# Patient Record
Sex: Male | Born: 1935 | Race: White | Hispanic: No | Marital: Married | Smoking: Former smoker
Health system: Southern US, Community
[De-identification: ages and names within clinical notes are randomized; demographics above are authoritative.]

## PROBLEM LIST (undated history)

## (undated) DIAGNOSIS — E079 Disorder of thyroid, unspecified: Secondary | ICD-10-CM

## (undated) DIAGNOSIS — I509 Heart failure, unspecified: Secondary | ICD-10-CM

## (undated) DIAGNOSIS — D689 Coagulation defect, unspecified: Secondary | ICD-10-CM

## (undated) DIAGNOSIS — M1990 Unspecified osteoarthritis, unspecified site: Secondary | ICD-10-CM

## (undated) DIAGNOSIS — I4891 Unspecified atrial fibrillation: Secondary | ICD-10-CM

## (undated) DIAGNOSIS — J439 Emphysema, unspecified: Secondary | ICD-10-CM

## (undated) DIAGNOSIS — I10 Essential (primary) hypertension: Secondary | ICD-10-CM

## (undated) DIAGNOSIS — H269 Unspecified cataract: Secondary | ICD-10-CM

## (undated) DIAGNOSIS — IMO0002 Reserved for concepts with insufficient information to code with codable children: Secondary | ICD-10-CM

## (undated) HISTORY — DX: Heart failure, unspecified: I50.9

## (undated) HISTORY — DX: Coagulation defect, unspecified: D68.9

## (undated) HISTORY — PX: TIBIAL TUBERCLERPLASTY: SHX5953

## (undated) HISTORY — DX: Unspecified cataract: H26.9

## (undated) HISTORY — DX: Emphysema, unspecified: J43.9

## (undated) HISTORY — PX: EYE SURGERY: SHX253

## (undated) HISTORY — DX: Unspecified osteoarthritis, unspecified site: M19.90

## (undated) HISTORY — DX: Reserved for concepts with insufficient information to code with codable children: IMO0002

## (undated) HISTORY — PX: CARDIAC SURGERY: SHX584

---

## 1996-04-23 HISTORY — PX: SHOULDER ARTHROSCOPY WITH ROTATOR CUFF REPAIR: SHX5685

## 1997-03-23 HISTORY — PX: COLON SURGERY: SHX602

## 2000-07-21 HISTORY — PX: KNEE ARTHROSCOPY: SHX127

## 2009-06-13 HISTORY — PX: RADIOFREQUENCY ABLATION: SHX2290

## 2010-01-10 ENCOUNTER — Encounter: Admission: RE | Admit: 2010-01-10 | Discharge: 2010-01-10 | Payer: Self-pay | Admitting: Cardiology

## 2010-01-16 ENCOUNTER — Inpatient Hospital Stay (HOSPITAL_COMMUNITY)
Admission: RE | Admit: 2010-01-16 | Discharge: 2010-01-18 | Disposition: A | Discharge status: Home / Self Care | Payer: Self-pay | Admitting: Cardiology

## 2010-01-16 HISTORY — PX: LOOP RECORDER IMPLANT: SHX5954

## 2010-06-04 LAB — CULTURE, BLOOD (ROUTINE X 2)
Culture: NO GROWTH
Culture: NO GROWTH

## 2010-06-04 LAB — BASIC METABOLIC PANEL
CO2: 32 mEq/L (ref 19–32)
Chloride: 105 mEq/L (ref 96–112)
GFR calc Af Amer: >60 mL/min (ref >60)
GFR calc Af Amer: >60 mL/min (ref >60)
GFR calc non Af Amer: >60 mL/min (ref >60)
Glucose, Bld: 129 mg/dL — ABNORMAL HIGH (ref 70–99)
Potassium: 4.6 mEq/L (ref 3.5–5.1)
Potassium: 4.6 mEq/L (ref 3.5–5.1)
Sodium: 136 mEq/L (ref 135–145)
Sodium: 139 mEq/L (ref 135–145)

## 2010-06-04 LAB — SURGICAL PCR SCREEN: MRSA, PCR: NEGATIVE

## 2010-06-04 LAB — CBC
HCT: 41.2 % (ref 39.0–52.0)
Hemoglobin: 13.7 g/dL (ref 13.0–17.0)
Hemoglobin: 13.8 g/dL (ref 13.0–17.0)
MCH: 30.3 pg (ref 26.0–34.0)
MCHC: 33.3 g/dL (ref 30.0–36.0)
MCV: 90.4 fL (ref 78.0–100.0)
Platelets: 222 10*3/uL (ref 150–400)
RBC: 4.56 MIL/uL (ref 4.22–5.81)
RBC: 4.63 MIL/uL (ref 4.22–5.81)
WBC: 9.7 10*3/uL (ref 4.0–10.5)

## 2010-06-04 LAB — URINALYSIS, ROUTINE W REFLEX MICROSCOPIC
Glucose, UA: NEGATIVE mg/dL
Hgb urine dipstick: NEGATIVE
Specific Gravity, Urine: 1.003 — ABNORMAL LOW (ref 1.005–1.030)
pH: 6.5 (ref 5.0–8.0)

## 2010-06-04 LAB — PROTIME-INR: INR: 1.31 (ref 0.00–1.49)

## 2010-11-19 ENCOUNTER — Ambulatory Visit (HOSPITAL_COMMUNITY)
Admission: RE | Admit: 2010-11-19 | Discharge: 2010-11-19 | Disposition: A | Discharge status: Home / Self Care | Payer: Medicare Other | Source: Ambulatory Visit | Attending: Cardiovascular Disease | Admitting: Cardiovascular Disease

## 2010-11-19 ENCOUNTER — Ambulatory Visit (HOSPITAL_COMMUNITY): Payer: Medicare Other

## 2010-11-19 DIAGNOSIS — I10 Essential (primary) hypertension: Secondary | ICD-10-CM | POA: Insufficient documentation

## 2010-11-19 DIAGNOSIS — Z4509 Encounter for adjustment and management of other cardiac device: Secondary | ICD-10-CM | POA: Insufficient documentation

## 2010-11-19 DIAGNOSIS — E119 Type 2 diabetes mellitus without complications: Secondary | ICD-10-CM | POA: Insufficient documentation

## 2010-11-19 DIAGNOSIS — J449 Chronic obstructive pulmonary disease, unspecified: Secondary | ICD-10-CM | POA: Insufficient documentation

## 2010-11-19 DIAGNOSIS — J4489 Other specified chronic obstructive pulmonary disease: Secondary | ICD-10-CM | POA: Insufficient documentation

## 2010-11-19 LAB — COMPREHENSIVE METABOLIC PANEL
ALT: 22 U/L (ref 0–53)
AST: 28 U/L (ref 0–37)
Alkaline Phosphatase: 55 U/L (ref 39–117)
CO2: 30 mEq/L (ref 19–32)
Calcium: 9.2 mg/dL (ref 8.4–10.5)
Glucose, Bld: 98 mg/dL (ref 70–99)
Potassium: 4.4 mEq/L (ref 3.5–5.1)
Sodium: 141 mEq/L (ref 135–145)
Total Protein: 5.8 g/dL — ABNORMAL LOW (ref 6.0–8.3)

## 2010-11-19 LAB — CBC
Hemoglobin: 14.5 g/dL (ref 13.0–17.0)
MCH: 30.1 pg (ref 26.0–34.0)
MCV: 88 fL (ref 78.0–100.0)
Platelets: 194 10*3/uL (ref 150–400)
RBC: 4.82 MIL/uL (ref 4.22–5.81)
WBC: 6.3 10*3/uL (ref 4.0–10.5)

## 2010-11-19 LAB — PROTIME-INR: Prothrombin Time: 13.6 seconds (ref 11.6–15.2)

## 2010-11-19 LAB — APTT: aPTT: 30 seconds (ref 24–37)

## 2010-11-19 LAB — TSH: TSH: 1.821 u[IU]/mL (ref 0.350–4.500)

## 2010-11-19 NOTE — H&P (Signed)
NAMEJONUS, COBLE NO.:  [id number]  MEDICAL RECORD NO.:  [id number]  LOCATION:  [facility]                         FACILITY:  [facility]  PHYSICIAN:  Thurmon Fair, MD     DATE OF BIRTH:  May 01, 1927  DATE OF ADMISSION:  11/19/2010 DATE OF DISCHARGE:                             HISTORY & PHYSICAL   This history and physical was dictated in anticipation of implantable loop recorder removal to be done on August 29th.  HISTORY OF PRESENT ILLNESS:  Samuel Mcconnell is an 77 year old gentleman with a history of paroxysmal atrial fibrillation who underwent an atrial fibrillation ablation in March 2011 and who has not had any significant supraventricular arrhythmic events in about 12 months' time.  His loop recorder has detected a few episodes of brief atrial tachycardia, but essentially he has been arrhythmia free since January.  PAST MEDICAL HISTORY:  Significant for recent episode of pneumonia within the last 12 months, systemic hypertension, type 2 diabetes mellitus, not requiring insulin therapy and chronic obstructive pulmonary disease.  He does not have a history of known structural heart problems or coronary artery disease.  He did have a large pericardial effusion in March 2011 following his ablation and he has had multiple surgeries including partial colectomy 7- 8 years ago, left shoulder arthroscopy surgery, bilateral knee arthroscopy surgery and has reportedly had a stroke.  CURRENT MEDICATIONS: 1. Metoprolol tartrate 50 mg b.i.d. 2. Multivitamin once daily. 3. Prilosec 20 mg daily. 4. Advair p.r.n. 5. Flonase p.r.n. 6. Osteo Bi-Flex supplement. 7. Levothyroxine 88 mcg one and a half tablets alternating with 1     tablet daily. 8. Aspirin 325 mg daily.  ALLERGIES:  He is allergic to CLINDAMYCIN.  PHYSICAL EXAMINATION:  VITAL SIGNS:  He weighs approximately 200 pounds, he is 6 feet tall for BMI of roughly 27.5, blood pressure is 115/80, heart rate is 60  and regular, respiratory rate is 14.  He is afebrile. HEENT:  Head exam is unremarkable.  His pupils are equal, round and reactive to light.  Extraocular movements intact.  Mouth, ears, nose, and oropharynx within normal limits. NECK:  Supple.  Jugular venous pulsations are normal.  The carotid pulses are brisk, but delay.  There is no goiter or lymphadenopathy. LUNGS:  Clear to auscultation bilaterally. CARDIOVASCULAR:  Shows normal position, quality of apical impulse. Regular rate and rhythm.  Normal first and second heart sounds.  There are no murmurs, rubs or gallops. ABDOMEN:  Soft, nontender, nondistended without any palpable masses, abnormal pulsatility, or flank bruits.  The bowel sounds are normal. EXTREMITIES:  Do not any clubbing, cyanosis, or edema.  He has 2+ pulses in radials, dullness, branchials, femorals, popliteals, dorsalis pedis, and posterior tibials bilaterally. NEUROLOGIC:  Brief neurological exam shows no gross focal deficits.  Electrocardiogram shows a sinus rhythm, his most recent echocardiogram shows preserved left ventricle systolic function and no meaningful valvular abnormalities.  The risks and benefits of implantable loop recorder explantation were discussed.  He understands and agrees to proceed.     Thurmon Fair, MD     MC/MEDQ  D:  11/19/2010  T:  11/19/2010  Job:  [id number]  cc:   Tresa Moore  [facility] Dr. Arnetha Gula  Electronically Signed by Thurmon Fair M.D. on 11/19/2010 04:59:36 PM

## 2010-11-29 NOTE — Op Note (Signed)
  NAMERUBERT, FREDIANI               ACCOUNT NO.:  [id number]  MEDICAL RECORD NO.:  [id number]  LOCATION:  [facility]                         FACILITY:  [facility]  PHYSICIAN:  Thurmon Fair, MD     DATE OF BIRTH:  12-07-1927  DATE OF PROCEDURE:  11/19/2010 DATE OF DISCHARGE:  11/19/2010                              OPERATIVE REPORT   PROCEDURE PERFORMED:  Removal of implantable loop recorder.  REASON FOR THE PROCEDURE:  No evidence of atrial fibrillation roughly 1 year following successful atrial fibrillation ablation.  DESCRIPTION:  After risks and benefits of the procedure were described, the patient provided informed consent and was prepped and draped in usual sterile fashion in the cardiac cath lab where he had been brought in the fasting state.  Local anesthesia with 1% lidocaine was administered to the area of the left prepectoral loop recorder.  A 3-cm horizontal incision was made with a scalpel after which the pocket was opened using limited electrocautery and blunt dissection.  The loop recorder was easily explanted after its silk stitches had been resected. The pocket was examined and treated for good hemostasis.  It was flushed with copious amounts of antibiotic solution and then closed in layers using two layers of 2-0 Vicryl and a superficial layer of 4-0 Vicryl.  A sterile dressing was applied.  No immediate complications occurred.  MEDICATIONS ADMINISTERED:  Ancef 1 g intravenously, Versed and fentanyl for conscious sedation, lidocaine 1% 15 mL for local anesthesia.  COMPLICATIONS:  None.  ESTIMATED BLOOD LOSS:  Less than 10 mL .  The explanted device was a Medtronic Reveal XT implantable loop recorder.     Thurmon Fair, MD     MC/MEDQ  D:  11/19/2010  T:  11/19/2010  Job:  [id number]  cc:   __________Dr. Francina Ames  Electronically Signed by Thurmon Fair M.D. on 11/29/2010 09:50:17 AM

## 2011-11-19 HISTORY — PX: TRANSTHORACIC ECHOCARDIOGRAM: SHX275

## 2012-06-04 ENCOUNTER — Emergency Department
Admission: EM | Admit: 2012-06-04 | Discharge: 2012-06-04 | Disposition: A | Discharge status: Home / Self Care | Payer: Medicare Other | Source: Home / Self Care | Attending: Family Medicine | Admitting: Family Medicine

## 2012-06-04 ENCOUNTER — Encounter: Payer: Self-pay | Admitting: Emergency Medicine

## 2012-06-04 HISTORY — DX: Emphysema, unspecified: J43.9

## 2012-06-04 HISTORY — DX: Disorder of thyroid, unspecified: E07.9

## 2012-06-04 HISTORY — DX: Unspecified atrial fibrillation: I48.91

## 2012-06-04 MED ORDER — DOXYCYCLINE HYCLATE 100 MG PO CAPS: 100.0000 mg | ORAL_CAPSULE | Freq: Two times a day (BID) | ORAL | Status: AC

## 2012-06-04 MED ORDER — PREDNISONE 50 MG PO TABS: ORAL_TABLET | ORAL | Status: DC

## 2012-06-04 MED ORDER — METHYLPREDNISOLONE SODIUM SUCC 125 MG IJ SOLR
125.0000 mg | Freq: Once | INTRAMUSCULAR | Status: AC
  Administered 2012-06-04: 125 mg via INTRAMUSCULAR

## 2012-06-04 NOTE — ED Provider Notes (Signed)
History     CSN: [id number]  Arrival date & time 06/04/12  1109   First MD Initiated Contact with Patient 06/04/12 1114      Chief Complaint  Patient presents with  . Nasal Congestion   HPI  URI Symptoms Onset: 2-3 days Description: rhinorrhea, nasal congestion, cough  Modifying factors:  Baseline emphysema. On advair. Feels like he may develop a COPD exacerbation if he is not treated for his symptoms early.    Symptoms Nasal discharge: yes Fever: no Sore throat: no Cough: yes Wheezing: mild Ear pain: no GI symptoms: no Sick contacts: no  Red Flags  Stiff neck: no Dyspnea: no Rash: no Swallowing difficulty: no  Sinusitis Risk Factors Headache/face pain: no Double sickening: no tooth pain: no  Allergy Risk Factors Sneezing: no Itchy scratchy throat: no Seasonal symptoms: no  Flu Risk Factors Headache: no muscle aches: no severe fatigue: no   Past Medical History  Diagnosis Date  . Emphysema   . Atrial fibrillation   . Thyroid disease     Past Surgical History  Procedure Laterality Date  . Cardiac surgery    . Shoulder arthroscopy with rotator cuff repair      left  . Knee arthroscopy      left  . Colon surgery      Family History  Problem Relation Age of Onset  . Heart failure Mother   . Heart failure Father   . Heart failure Sister   . Diabetes Sister   . Heart failure Brother   . Diabetes Son     History  Substance Use Topics  . Smoking status: Former Games developer  . Smokeless tobacco: Not on file  . Alcohol Use: No      Review of Systems  All other systems reviewed and are negative.    Allergies  Clindamycin/lincomycin  Home Medications   Current Outpatient Rx  Name  Route  Sig  Dispense  Refill  . aspirin 81 MG chewable tablet   Oral   Chew 81 mg by mouth daily.         . fluticasone (VERAMYST) 27.5 MCG/SPRAY nasal spray   Nasal   Place 2 sprays into the nose daily.         . Fluticasone-Salmeterol (ADVAIR)  250-50 MCG/DOSE AEPB   Inhalation   Inhale 1 puff into the lungs every 12 (twelve) hours.         Marland Kitchen levothyroxine (SYNTHROID, LEVOTHROID) 100 MCG tablet   Oral   Take 100 mcg by mouth daily.         . metoprolol-hydrochlorothiazide (LOPRESSOR HCT) 50-25 MG per tablet   Oral   Take 1 tablet by mouth daily.         Marland Kitchen omeprazole (PRILOSEC) 10 MG capsule   Oral   Take 10 mg by mouth daily.           BP 124/67  Pulse 62  Temp(Src) 97.3 F (36.3 C) (Oral)  Ht 6' (1.829 m)  Wt 200 lb (90.719 kg)  BMI 27.12 kg/m2  SpO2 95%  Physical Exam  Constitutional: He appears well-developed and well-nourished.  HENT:  Head: Normocephalic and atraumatic.  Right Ear: External ear normal.  Left Ear: External ear normal.  +nasal erythema, rhinorrhea bilaterally, + post oropharyngeal erythema    Eyes: Conjunctivae are normal. Pupils are equal, round, and reactive to light.  Neck: Normal range of motion. Neck supple.  Cardiovascular: Normal rate, regular rhythm and normal heart sounds.  Pulmonary/Chest: Effort normal and breath sounds normal.  Abdominal: Soft.  Musculoskeletal: Normal range of motion.  Lymphadenopathy:    He has no cervical adenopathy.  Neurological: He is alert.  Skin: Skin is warm.    ED Course  Procedures (including critical care time)  Labs Reviewed - No data to display No results found.   1. URI (upper respiratory infection)   2. COPD (chronic obstructive pulmonary disease)       MDM  Likely early viral URI and prodromal stages of a COPD exacerbation. Solu-Medrol 125 mg IM x1. Will place patient on course of prednisone and doxycycline for treatment. Discussed imaging. Patient feels that it's not clinically indicated currently. Discuss infectious and respiratory red flags at length with patient which patient is aware of. Followup as needed.    The patient and/or caregiver has been counseled thoroughly with regard to treatment plan and/or  medications prescribed including dosage, schedule, interactions, rationale for use, and possible side effects and they verbalize understanding. Diagnoses and expected course of recovery discussed and will return if not improved as expected or if the condition worsens. Patient and/or caregiver verbalized understanding.             Doree Albee, MD 06/04/12 1155

## 2012-06-04 NOTE — ED Notes (Signed)
Reports nasal congestion 'dripping to chest' x 3 days; has emphysema and historically URIs turn into pneumonia rapidly. Ambulatory and smiling.

## 2012-06-24 DIAGNOSIS — J309 Allergic rhinitis, unspecified: Secondary | ICD-10-CM | POA: Insufficient documentation

## 2013-01-21 ENCOUNTER — Other Ambulatory Visit: Payer: Self-pay | Admitting: Cardiology

## 2013-01-23 NOTE — Telephone Encounter (Signed)
Rx was sent to pharmacy electronically. 

## 2013-02-21 DIAGNOSIS — N183 Chronic kidney disease, stage 3 unspecified: Secondary | ICD-10-CM | POA: Insufficient documentation

## 2013-02-21 DIAGNOSIS — N1832 Chronic kidney disease, stage 3b: Secondary | ICD-10-CM | POA: Insufficient documentation

## 2013-02-22 ENCOUNTER — Telehealth: Payer: Self-pay | Admitting: Cardiovascular Disease

## 2013-02-22 NOTE — Telephone Encounter (Signed)
Returned call and pt verified x 2 w/ pt's wife, [name].  Stated pt is in the hospital w/ Flu A.  Stated pt had EKG and pt has been going in and out of Afib.  Stated she scheduled pt to see Dr. Salena Saner on 12.24.14 and she wanted to let him know.  Denied pt is having any symptoms.  Stated he did c/o a funny feeling in his chest when he was in the ER though.  Wife stated pt is still admitted and should be going home tomorrow.  RN asked wife if they would like to be seen sooner as an appt w/ an Extender can be arranged.  Wife declined and stated she wanted to let Dr. Salena Saner know and that she has copies of the EKGs they did.  Stated she will bring them to the appt.  Wife informed Dr. Salena Saner will be notified and advised if pt becomes symptomatic, to call back for a sooner appt w/ an Extender.

## 2013-02-22 NOTE — Telephone Encounter (Signed)
Would like to tell what is going with Samuel Mcconnell .Marland Kitchen Please call at [phone] he is in the hospital in [place]   Thanks

## 2013-03-14 ENCOUNTER — Encounter: Payer: Self-pay | Admitting: *Deleted

## 2013-03-14 ENCOUNTER — Encounter: Payer: Self-pay | Admitting: Cardiovascular Disease

## 2013-03-15 ENCOUNTER — Encounter: Payer: Self-pay | Admitting: Cardiovascular Disease

## 2013-03-15 ENCOUNTER — Ambulatory Visit (INDEPENDENT_AMBULATORY_CARE_PROVIDER_SITE_OTHER): Payer: Medicare HMO | Admitting: Cardiovascular Disease

## 2013-03-15 VITALS — BP 120/66 | HR 70 | Resp 16 | Ht 72.0 in | Wt 199.8 lb

## 2013-03-15 DIAGNOSIS — I4819 Other persistent atrial fibrillation: Secondary | ICD-10-CM | POA: Insufficient documentation

## 2013-03-15 DIAGNOSIS — I480 Paroxysmal atrial fibrillation: Secondary | ICD-10-CM | POA: Insufficient documentation

## 2013-03-15 DIAGNOSIS — I4891 Unspecified atrial fibrillation: Secondary | ICD-10-CM

## 2013-03-15 MED ORDER — METOPROLOL TARTRATE 25 MG PO TABS: 12.5000 mg | ORAL_TABLET | Freq: Two times a day (BID) | ORAL | Status: DC

## 2013-03-15 MED ORDER — ASPIRIN 81 MG PO CHEW: 162.0000 mg | CHEWABLE_TABLET | Freq: Every day | ORAL | Status: DC

## 2013-03-15 NOTE — Progress Notes (Signed)
Patient ID: Samuel Mcconnell, male   DOB: [birth year], 79 y.o.   MRN: [id number]      Reason for office visit Atrial fibrillation  Mr. Montrose is an 79 year old gentleman who had successful radiofrequency ablation for atrial fibrillation several years ago. Since that time he has only had brief episodes of paroxysmal atrial tachycardia but no confirmed atrial fibrillation. He was recently hospitalized in [place] for acute influenza with respiratory difficulties. He developed atrial fibrillation during that hospitalization. As he improved, before he was discharged from the hospital, the atrial fibrillation spontaneously converted to normal rhythm.  Other than his advanced age, he really does not have other risk factors for cardioembolic events. He does not have valvular heart disease, diabetes mellitus, congestive heart failure and has never had a stroke or TIA. His chart there is a diagnosis of systemic hypertension but he is only taking a tiny amount of beta blocker for the arrhythmia and has normal blood pressure. I am not sure that he has ever truly had hypertension. He was taking higher doses of beta blocker for ventricular rate control in the past, but as he has aged and has required less beta blocker his blood pressure has remained normal.  If one discounts the diagnosis of systemic hypertension he is at low risk for stroke (CHADSVasc score 1). He has a normal size left atrium by echocardiography. He has normal left ventricular systolic function and no echo signs of diastolic dysfunction and normal pulmonary artery pressure. He does develop ankle swelling for which she takes an occasional loop diuretic.  He has mild chronic dyspnea related to COPD but prior to his flu was able to go to the gym most days of the week and exercise without a lot of problems.    Allergies  Allergen Reactions  . Clindamycin/Lincomycin   . Dabigatran Etexilate Mesylate Other (See Comments)    "felt weird"  .  Doxycycline     Dyspepsia    Current Outpatient Prescriptions  Medication Sig Dispense Refill  . aspirin 81 MG chewable tablet Chew 2 tablets (162 mg total) by mouth daily.      . fluticasone (VERAMYST) 27.5 MCG/SPRAY nasal spray Place 2 sprays into the nose daily.      . Fluticasone-Salmeterol (ADVAIR) 250-50 MCG/DOSE AEPB Inhale 1 puff into the lungs every 12 (twelve) hours.      . furosemide (LASIX) 20 MG tablet Take 20 mg by mouth daily as needed.      Marland Kitchen levothyroxine (SYNTHROID, LEVOTHROID) 88 MCG tablet Take 88 mcg by mouth daily before breakfast.      . metoprolol tartrate (LOPRESSOR) 25 MG tablet Take 0.5 tablets (12.5 mg total) by mouth 2 (two) times daily.  180 tablet  1  . Misc Natural Products (OSTEO BI-FLEX ADV DOUBLE ST PO) Take by mouth 2 (two) times daily.      . Multiple Vitamin (MULTIVITAMIN) capsule Take 1 capsule by mouth daily.      Marland Kitchen omeprazole (PRILOSEC) 10 MG capsule Take 10 mg by mouth daily.      Marland Kitchen albuterol (PROVENTIL HFA;VENTOLIN HFA) 108 (90 BASE) MCG/ACT inhaler Inhale into the lungs every 6 (six) hours as needed for wheezing or shortness of breath.       No current facility-administered medications for this visit.    Past Medical History  Diagnosis Date  . Emphysema   . Atrial fibrillation     radiofrequency ablation  . Thyroid disease     Past Surgical History  Procedure Laterality  Date  . Cardiac surgery    . Shoulder arthroscopy with rotator cuff repair Left 04/1996  . Knee arthroscopy Left 07/2000  . Colon surgery  1999  . Radiofrequency ablation  06/13/2009  . Loop recorder implant  01/16/2010    Medtronic Reveal XT (Dr. Claudia Desanctis)   . Loop recorder explant  ~ 10/2010  . Transthoracic echocardiogram  11/19/2011    EF=>55%; mild MR, mild mitral annular calcif; mild TR, normal RSVP; AV mildly sclerotic, mild AV regurg; trace pulm valve regurg     Family History  Problem Relation Age of Onset  . Heart failure Mother   . Heart failure Father       MI - died @ 18  . Diabetes Son   . Pancreatitis Son     also ETOH abuse   . Cancer Maternal Grandmother   . Heart disease Maternal Grandfather   . Atrial fibrillation Brother     dx'ed age 65  . Atrial fibrillation Brother     dx'ed age 89  . Heart failure Brother   . Atrial fibrillation Sister   . Hypertension Sister   . Heart Problems Sister   . Diabetes Sister   . Heart failure Sister     History   Social History  . Marital Status: Married    Spouse Name: N/A    Number of Children: 2  . Years of Education: N/A   Occupational History  . Not on file.   Social History Main Topics  . Smoking status: Former Smoker -- 46 years    Quit date: 03/14/2002  . Smokeless tobacco: Not on file     Comment: 40 pack year history   . Alcohol Use: No  . Drug Use: No  . Sexual Activity: Not on file   Other Topics Concern  . Not on file   Social History Narrative  . No narrative on file    Review of systems: The patient specifically denies any chest pain at rest or with exertion, dyspnea at rest or with exertion, orthopnea, paroxysmal nocturnal dyspnea, syncope, palpitations, focal neurological deficits, intermittent claudication, lower extremity edema, unexplained weight gain, cough, hemoptysis or wheezing.  The patient also denies abdominal pain, nausea, vomiting, dysphagia, diarrhea, constipation, polyuria, polydipsia, dysuria, hematuria, frequency, urgency, abnormal bleeding or bruising, fever, chills, unexpected weight changes, mood swings, change in skin or hair texture, change in voice quality, auditory or visual problems, allergic reactions or rashes, new musculoskeletal complaints other than usual "aches and pains".   PHYSICAL EXAM BP 120/66  Pulse 70  Resp 16  Ht 6' (1.829 m)  Wt 199 lb 12.8 oz (90.629 kg)  BMI 27.09 kg/m2  General: Alert, oriented x3, no distress Head: no evidence of trauma, PERRL, EOMI, no exophtalmos or lid lag, no myxedema, no xanthelasma;  normal ears, nose and oropharynx Neck: normal jugular venous pulsations and no hepatojugular reflux; brisk carotid pulses without delay and no carotid bruits Chest: clear to auscultation, no signs of consolidation by percussion or palpation, normal fremitus, symmetrical and full respiratory excursions Cardiovascular: normal position and quality of the apical impulse, regular rhythm with occasional ectopy, normal first and widely split second heart sounds, no murmurs, rubs or gallops Abdomen: no tenderness or distention, no masses by palpation, no abnormal pulsatility or arterial bruits, normal bowel sounds, no hepatosplenomegaly Extremities: no clubbing, cyanosis or edema; 2+ radial, ulnar and brachial pulses bilaterally; 2+ right femoral, posterior tibial and dorsalis pedis pulses; 2+ left femoral, posterior tibial and  dorsalis pedis pulses; no subclavian or femoral bruits Neurological: grossly nonfocal   EKG: Sinus rhythm with PACs, right bundle branch block, QTC 468 ms, no ischemic repolarization abnormalities  BMET    Component Value Date/Time   NA 141 11/19/2010 0814   K 4.4 11/19/2010 0814   CL 105 11/19/2010 0814   CO2 30 11/19/2010 0814   GLUCOSE 98 11/19/2010 0814   BUN 23 11/19/2010 0814   CREATININE 1.23 11/19/2010 0814   CALCIUM 9.2 11/19/2010 0814   GFRNONAA 56* 11/19/2010 0814   GFRAA >60 11/19/2010 0814     ASSESSMENT AND PLAN PAF (paroxysmal atrial fibrillation) This is his first documented episode of atrial fibrillation since the ablation procedure. It occurred during acute illness with respiratory failure secondary to influenza. He has never had cardioembolic events and I believe that his overall risk for stroke remains low. I decided to recommend against use of potent anticoagulant such as warfarin and prefer to keep him on aspirin for its lower side effect profile. He should take at least 162 mg of aspirin daily. I've also advised that he take metoprolol tartrate split into 2  daily doses because of its short duration of action. If he should develop any acute neurological event consistent with embolic stroke/TIA, the risk benefit analysis would drastically change and he should go on full dose anticoagulation   Orders Placed This Encounter  Procedures  . EKG 12-Lead   Meds ordered this encounter  Medications  . aspirin 81 MG chewable tablet    Sig: Chew 2 tablets (162 mg total) by mouth daily.  . metoprolol tartrate (LOPRESSOR) 25 MG tablet    Sig: Take 0.5 tablets (12.5 mg total) by mouth 2 (two) times daily.    Dispense:  180 tablet    Refill:  1    Rena Hunke  Thurmon Fair, MD, [facility] [phone] office [phone] pager

## 2013-03-15 NOTE — Patient Instructions (Signed)
INCREASE Aspirin to 81mg  2 tablets daily.  DECREASE Metoprolol to 25mg  1/2 tablet twice a day.  Your physician recommends that you schedule a follow-up appointment in: ONE YEAR.

## 2013-03-15 NOTE — Assessment & Plan Note (Addendum)
This is his first documented episode of atrial fibrillation since the ablation procedure. It occurred during acute illness with respiratory failure secondary to influenza. He has never had cardioembolic events and I believe that his overall risk for stroke remains low. I decided to recommend against use of potent anticoagulant such as warfarin and prefer to keep him on aspirin for its lower side effect profile. He should take at least 162 mg of aspirin daily. I've also advised that he take metoprolol tartrate split into 2 daily doses because of its short duration of action. If he should develop any acute neurological event consistent with embolic stroke/TIA, the risk benefit analysis would drastically change and he should go on full dose anticoagulation

## 2013-03-15 NOTE — Assessment & Plan Note (Signed)
>>  ASSESSMENT AND PLAN FOR PAF (PAROXYSMAL ATRIAL FIBRILLATION) ([facility]) WRITTEN ON 03/15/2013  2:33 PM BY CROITORU, MIHAI, MD  This is his first documented episode of atrial fibrillation since the ablation procedure. It occurred during acute illness with respiratory failure secondary to influenza. He has never had cardioembolic events and I believe that his overall risk for stroke remains low. I decided to recommend against use of potent anticoagulant such as warfarin and prefer to keep him on aspirin  for its lower side effect profile. He should take at least 162 mg of aspirin  daily. I've also advised that he take metoprolol  tartrate split into 2 daily doses because of its short duration of action. If he should develop any acute neurological event consistent with embolic stroke/TIA, the risk benefit analysis would drastically change and he should go on full dose anticoagulation

## 2013-04-21 ENCOUNTER — Other Ambulatory Visit: Payer: Self-pay | Admitting: Cardiovascular Disease

## 2013-04-24 NOTE — Telephone Encounter (Signed)
Rx was sent to pharmacy electronically. 

## 2013-06-08 ENCOUNTER — Telehealth: Payer: Self-pay | Admitting: Cardiovascular Disease

## 2013-06-08 MED ORDER — METOPROLOL TARTRATE 25 MG PO TABS: ORAL_TABLET | ORAL | Status: DC

## 2013-06-08 NOTE — Telephone Encounter (Signed)
BP  HR 148/71  79 155/78  73 147/79    After increase in meds.  135/67  67 130/61  63 (today)

## 2013-06-08 NOTE — Telephone Encounter (Signed)
Returned call and pt verified x 2.  Pt stated the last time he was seen, he was told to decrease metoprolol to half twice day and ASA to 325 mg.  Stated he has seen BP and pulse go up over the past couple of weeks, so he took a whole pill in AM and 1/2 at night.  Stated his BP and pulse came down.  Denied CP, SOB or swelling.  Pt stated he isn't sure if the doctor will agree w/ what he did or not and would like to know what he thinks.  Pt informed Dr. Loletha Grayer will be notified for further instructions.  Pt verbalized understanding and agreed w/ plan.  Message forwarded to Dr. Sallyanne Kuster.

## 2013-06-08 NOTE — Telephone Encounter (Signed)
Returned call and informed pt per instructions by MD.  Pt verbalized understanding and agreed w/ plan.  Rx sent to pharmacy to hold until refill due.

## 2013-06-08 NOTE — Telephone Encounter (Signed)
Please call,concerning his Metoprolol. It does not seen to be workin that well since he reduced it.

## 2013-06-08 NOTE — Telephone Encounter (Signed)
Returned call.  Left message to call back before 4pm.  

## 2013-06-08 NOTE — Telephone Encounter (Signed)
The BP looks great on the regimen he has changed to and I would stick with it

## 2013-06-20 ENCOUNTER — Encounter: Payer: Self-pay | Admitting: Emergency Medicine

## 2013-06-20 ENCOUNTER — Emergency Department
Admission: EM | Admit: 2013-06-20 | Discharge: 2013-06-20 | Disposition: A | Discharge status: Home / Self Care | Payer: Medicare HMO | Source: Home / Self Care | Attending: Emergency Medicine | Admitting: Emergency Medicine

## 2013-06-20 DIAGNOSIS — J441 Chronic obstructive pulmonary disease with (acute) exacerbation: Secondary | ICD-10-CM

## 2013-06-20 DIAGNOSIS — J0100 Acute maxillary sinusitis, unspecified: Secondary | ICD-10-CM

## 2013-06-20 HISTORY — DX: Essential (primary) hypertension: I10

## 2013-06-20 MED ORDER — METHYLPREDNISOLONE ACETATE 80 MG/ML IJ SUSP
80.0000 mg | Freq: Once | INTRAMUSCULAR | Status: AC
  Administered 2013-06-20: 80 mg via INTRAMUSCULAR

## 2013-06-20 MED ORDER — CEFTRIAXONE SODIUM 1 G IJ SOLR
1.0000 g | INTRAMUSCULAR | Status: AC
  Administered 2013-06-20: 1 g via INTRAMUSCULAR

## 2013-06-20 MED ORDER — PREDNISONE (PAK) 10 MG PO TABS: ORAL_TABLET | ORAL | Status: DC

## 2013-06-20 MED ORDER — IPRATROPIUM-ALBUTEROL 0.5-2.5 (3) MG/3ML IN SOLN
3.0000 mL | Freq: Once | RESPIRATORY_TRACT | Status: AC
  Administered 2013-06-20: 3 mL via RESPIRATORY_TRACT

## 2013-06-20 MED ORDER — AZITHROMYCIN 250 MG PO TABS: ORAL_TABLET | ORAL | Status: DC

## 2013-06-20 NOTE — ED Provider Notes (Addendum)
CSN: [id number]     Arrival date & time 06/20/13  7371 History   None    Chief Complaint  Patient presents with  . Cough   (Consider location/radiation/quality/duration/timing/severity/associated sxs/prior Treatment) HPI URI HISTORY  Samuel Mcconnell is a 80 y.o. male who complains of onset of cold symptoms for 7 days.  Have been using over-the-counter treatment and keratosis and prescribed by his PCP, which helps a little bit. For the past 2 days, worsening symptoms with cough productive of yellow-green sputum.  He states he does not feel severely sick, compared to when he was hospitalized for COPD exacerbation and influenza A in December.  Mild chills/sweats +  Fever  +  Nasal congestion +  Discolored Post-nasal drainage No sinus pain/pressure No sore throat  +  Cough, productive of yellow-green sputum Slight increase wheezing with exertion. No chest congestion No hemoptysis Minimal increase in his baseline dyspnea on exertion . No pleuritic pain No chest pain  No itchy/red eyes No earache  No nausea No vomiting No abdominal pain No diarrhea  No skin rashes + Mild Fatigue No myalgias No headache. No syncope. No focal neurologic symptoms   Past Medical History  Diagnosis Date  . Emphysema   . Atrial fibrillation     radiofrequency ablation  . Thyroid disease   . Hypertension    Past Surgical History  Procedure Laterality Date  . Cardiac surgery    . Shoulder arthroscopy with rotator cuff repair Left 04/1996  . Knee arthroscopy Left 07/2000  . Colon surgery  1999  . Radiofrequency ablation  06/13/2009  . Loop recorder implant  01/16/2010    Medtronic Reveal XT (Dr. Norlene Duel)   . Loop recorder explant  ~ 10/2010  . Transthoracic echocardiogram  11/19/2011    EF=>55%; mild MR, mild mitral annular calcif; mild TR, normal RSVP; AV mildly sclerotic, mild AV regurg; trace pulm valve regurg    Family History  Problem Relation Age of Onset  . Heart failure Mother   .  Heart failure Father     MI - died @ 65  . Diabetes Son   . Pancreatitis Son     also ETOH abuse   . Cancer Maternal Grandmother   . Heart disease Maternal Grandfather   . Atrial fibrillation Brother     dx'ed age 37  . Atrial fibrillation Brother     dx'ed age 108  . Heart failure Brother   . Atrial fibrillation Sister   . Hypertension Sister   . Heart Problems Sister   . Diabetes Sister   . Heart failure Sister    History  Substance Use Topics  . Smoking status: Former Smoker -- 78 years    Quit date: 03/14/2002  . Smokeless tobacco: Never Used     Comment: 40 pack year history   . Alcohol Use: No    Review of Systems  All other systems reviewed and are negative.    Allergies  Clindamycin/lincomycin; Dabigatran etexilate mesylate; and Doxycycline  Home Medications   Current Outpatient Rx  Name  Route  Sig  Dispense  Refill  . albuterol (PROVENTIL HFA;VENTOLIN HFA) 108 (90 BASE) MCG/ACT inhaler   Inhalation   Inhale into the lungs every 6 (six) hours as needed for wheezing or shortness of breath.         Marland Kitchen aspirin 81 MG chewable tablet   Oral   Chew 2 tablets (162 mg total) by mouth daily.         Marland Kitchen  azithromycin (ZITHROMAX Z-PAK) 250 MG tablet      Take 2 tablets on day one, then 1 tablet daily on days 2 through 5   1 each   0   . fluticasone (VERAMYST) 27.5 MCG/SPRAY nasal spray   Nasal   Place 2 sprays into the nose daily.         . Fluticasone-Salmeterol (ADVAIR) 250-50 MCG/DOSE AEPB   Inhalation   Inhale 1 puff into the lungs every 12 (twelve) hours.         . furosemide (LASIX) 20 MG tablet      TAKE 1 TABLET BY MOUTH EVERY DAY   30 tablet   10   . levothyroxine (SYNTHROID, LEVOTHROID) 88 MCG tablet   Oral   Take 88 mcg by mouth daily before breakfast.         . metoprolol tartrate (LOPRESSOR) 25 MG tablet      Take one tab every morning and half tab every evening.  As directed.   135 tablet   1     Please note dose increase.   Pt is aware and will c ...   . Misc Natural Products (OSTEO BI-FLEX ADV DOUBLE ST PO)   Oral   Take by mouth 2 (two) times daily.         . Multiple Vitamin (MULTIVITAMIN) capsule   Oral   Take 1 capsule by mouth daily.         Marland Kitchen omeprazole (PRILOSEC) 10 MG capsule   Oral   Take 10 mg by mouth daily.         . predniSONE (STERAPRED UNI-PAK) 10 MG tablet      Take as directed for 6 days.--Take 6 on day 1, 5 on day 2, 4 on day 3, then 3 tablets on day 4, then 2 tablets on day 5, then 1 on day 6.   21 tablet   0    BP 102/60  Pulse 71  Temp(Src) 97.8 F (36.6 C) (Oral)  Resp 16  Wt 198 lb (89.812 kg)  SpO2 98% Physical Exam  Nursing note and vitals reviewed. Constitutional: He is oriented to person, place, and time. He appears well-developed and well-nourished. No distress.  Alert, cooperative male. No diaphoresis. No acute distress. Non-toxic-appearing. He can ambulate normally. Pulse ox 93% on room air, which is his normal baseline  HENT:  Head: Normocephalic and atraumatic.  Right Ear: Tympanic membrane, external ear and ear canal normal.  Left Ear: Tympanic membrane, external ear and ear canal normal.  Nose: Mucosal edema and rhinorrhea present. Right sinus exhibits maxillary sinus tenderness. Left sinus exhibits maxillary sinus tenderness.  Mouth/Throat: Oropharynx is clear and moist. No oral lesions. No oropharyngeal exudate.  Eyes: Right eye exhibits no discharge. Left eye exhibits no discharge. No scleral icterus.  Neck: Neck supple.  Cardiovascular: Normal rate, regular rhythm and normal heart sounds.   Pulmonary/Chest: Effort normal. No accessory muscle usage. No respiratory distress. He has no decreased breath sounds. He has wheezes (minimal late expiratory). He has rhonchi. He has no rales.  Few scattered rhonchi. Breath sounds equal. No rales  Lymphadenopathy:    He has no cervical adenopathy.  Neurological: He is alert and oriented to person, place,  and time.  Skin: Skin is warm and dry.    ED Course  Procedures (including critical care time) Labs Review Labs Reviewed - No data to display Imaging Review No results found. I advised chest x-ray. Risks, benefits, alternatives discussed. He  politely refused chest x-ray.  DuoNeb nebulizer treatment given. Patient reevaluated 15 minutes later. His wheezing improved. Only had minimal rhonchi. Otherwise lungs were clear. He felt much better. Pulse ox much improved 98%    MDM   1. Acute exacerbation of chronic obstructive pulmonary disease (COPD)   2. Acute maxillary sinusitis    Treatment options discussed, as well as risks, benefits, alternatives. Patient voiced understanding and agreement with the following plans: Rocephin 1 g IM Depo-Medrol 80 mg IM stat Zithromax Z-PAK Also gave written prescription for prednisone 10 mg-6 day Dosepak. He prefers to hold onto this for 2 days, and if he's not significantly improving, to go ahead and fill this. Other symptomatic care discussed. Continue his other meds. He has albuterol HFA at home to use when necessary rescue inhaler. Follow-up with your primary care doctor in 5-7 days if not improving, or sooner if symptoms become worse. Precautions discussed. Red flags discussed. Questions invited and answered. Patient voiced understanding and agreement.     Jacqulyn Cane, MD 06/20/13 [place], MD 06/20/13 1050

## 2013-06-20 NOTE — ED Notes (Signed)
Samuel Mcconnell reports "allergies" starting about 1 week ago. Cough developed and he saw PCP 4 days ago, given Cheratussin and PNA vaccine. 2 days ago his cough became productive and colored. C/o night sweats last night. Hx COPD. SOB with exertion.

## 2013-10-04 ENCOUNTER — Other Ambulatory Visit: Payer: Self-pay | Admitting: Cardiovascular Disease

## 2013-10-04 NOTE — Telephone Encounter (Signed)
Rx was sent to pharmacy electronically. 

## 2014-03-08 ENCOUNTER — Encounter: Payer: Self-pay | Admitting: Cardiovascular Disease

## 2014-03-08 ENCOUNTER — Ambulatory Visit (INDEPENDENT_AMBULATORY_CARE_PROVIDER_SITE_OTHER): Payer: Medicare HMO | Admitting: Cardiovascular Disease

## 2014-03-08 VITALS — BP 112/60 | HR 62 | Resp 16 | Ht 72.0 in | Wt 199.5 lb

## 2014-03-08 DIAGNOSIS — I480 Paroxysmal atrial fibrillation: Secondary | ICD-10-CM

## 2014-03-08 MED ORDER — METOPROLOL TARTRATE 25 MG PO TABS: ORAL_TABLET | ORAL | Status: DC

## 2014-03-08 NOTE — Patient Instructions (Addendum)
DECREASE Metoprolol to 25mg  1/2 tablet twice a day.  Dr. Sallyanne Kuster recommends that you schedule a follow-up appointment in: One year.

## 2014-03-10 NOTE — Progress Notes (Signed)
Patient ID: Samuel Mcconnell, male   DOB: 1927-04-25, 80 y.o.   MRN: [id number]     Reason for office visit atrial fibrillation   [name] has not had clinical atrial fibrillation recurrence since the influenza-related episode one year ago (the only documented event since his remote RF ablation procedure). He is at low embolic risk (age is only risk factor) prefers to avoid anticoagulants therapy. He has not had any neurological or bleeding events. He has normal LV systolic and diastolic function and a normal size left atrium.  Occasional ankle edema (responds to mall dose of loop diuretic) and COPD related class II exertional dyspnea are his only complaints.   Allergies  Allergen Reactions  . Clindamycin/Lincomycin   . Dabigatran Etexilate Mesylate Other (See Comments)    "felt weird"  . Doxycycline     Dyspepsia    Current Outpatient Prescriptions  Medication Sig Dispense Refill  . aspirin 81 MG chewable tablet Chew 2 tablets (162 mg total) by mouth daily.    . cetirizine (ZYRTEC) 10 MG tablet Take 10 mg by mouth at bedtime.    . fluticasone (VERAMYST) 27.5 MCG/SPRAY nasal spray Place 2 sprays into the nose daily.    . Fluticasone-Salmeterol (ADVAIR) 250-50 MCG/DOSE AEPB Inhale 1 puff into the lungs every 12 (twelve) hours.    . furosemide (LASIX) 20 MG tablet TAKE 1 TABLET BY MOUTH EVERY DAY (Patient taking differently: PRN) 30 tablet 10  . levothyroxine (SYNTHROID, LEVOTHROID) 88 MCG tablet Take 88 mcg by mouth daily before breakfast.    . metoprolol tartrate (LOPRESSOR) 25 MG tablet Take 1/2 tablet twice a day. 90 tablet 2  . Misc Natural Products (OSTEO BI-FLEX ADV DOUBLE ST PO) Take by mouth 2 (two) times daily.    . Multiple Vitamin (MULTIVITAMIN) capsule Take 1 capsule by mouth daily.    Marland Kitchen omeprazole (PRILOSEC) 10 MG capsule Take 10 mg by mouth daily.    Marland Kitchen sulfamethoxazole-trimethoprim (BACTRIM DS,SEPTRA DS) 800-160 MG per tablet Take 1 tablet by mouth 2 (two) times daily.   0    No current facility-administered medications for this visit.    Past Medical History  Diagnosis Date  . Emphysema   . Atrial fibrillation     radiofrequency ablation  . Thyroid disease   . Hypertension     Past Surgical History  Procedure Laterality Date  . Cardiac surgery    . Shoulder arthroscopy with rotator cuff repair Left 04/1996  . Knee arthroscopy Left 07/2000  . Colon surgery  1999  . Radiofrequency ablation  06/13/2009  . Loop recorder implant  01/16/2010    Medtronic Reveal XT (Dr. Norlene Duel)   . Loop recorder explant  ~ 10/2010  . Transthoracic echocardiogram  11/19/2011    EF=>55%; mild MR, mild mitral annular calcif; mild TR, normal RSVP; AV mildly sclerotic, mild AV regurg; trace pulm valve regurg     Family History  Problem Relation Age of Onset  . Heart failure Mother   . Heart failure Father     MI - died @ 63  . Diabetes Son   . Pancreatitis Son     also ETOH abuse   . Cancer Maternal Grandmother   . Heart disease Maternal Grandfather   . Atrial fibrillation Brother     dx'ed age 19  . Atrial fibrillation Brother     dx'ed age 54  . Heart failure Brother   . Atrial fibrillation Sister   . Hypertension Sister   . Heart  Problems Sister   . Diabetes Sister   . Heart failure Sister     History   Social History  . Marital Status: Married    Spouse Name: N/A    Number of Children: 2  . Years of Education: N/A   Occupational History  . Not on file.   Social History Main Topics  . Smoking status: Former Smoker -- 16 years    Quit date: 03/14/2002  . Smokeless tobacco: Never Used     Comment: 40 pack year history   . Alcohol Use: No  . Drug Use: No  . Sexual Activity: Not on file   Other Topics Concern  . Not on file   Social History Narrative    Review of systems: The patient specifically denies any chest pain at rest or with exertion, dyspnea at rest or with usual exertion, orthopnea, paroxysmal nocturnal dyspnea, syncope,  palpitations, focal neurological deficits, intermittent claudication, unexplained weight gain, cough, hemoptysis or wheezing.  The patient also denies abdominal pain, nausea, vomiting, dysphagia, diarrhea, constipation, polyuria, polydipsia, dysuria, hematuria, frequency, urgency, abnormal bleeding or bruising, fever, chills, unexpected weight changes, mood swings, change in skin or hair texture, change in voice quality, auditory or visual problems, allergic reactions or rashes, new musculoskeletal complaints other than usual "aches and pains".   PHYSICAL EXAM BP 112/60 mmHg  Pulse 62  Resp 16  Ht 6' (1.829 m)  Wt 199 lb 8 oz (90.493 kg)  BMI 27.05 kg/m2 General: Alert, oriented x3, no distress Head: no evidence of trauma, PERRL, EOMI, no exophtalmos or lid lag, no myxedema, no xanthelasma; normal ears, nose and oropharynx Neck: normal jugular venous pulsations and no hepatojugular reflux; brisk carotid pulses without delay and no carotid bruits Chest: clear to auscultation, no signs of consolidation by percussion or palpation, normal fremitus, symmetrical and full respiratory excursions Cardiovascular: normal position and quality of the apical impulse, regular rhythm with occasional ectopy, normal first and widely split second heart sounds, no murmurs, rubs or gallops Abdomen: no tenderness or distention, no masses by palpation, no abnormal pulsatility or arterial bruits, normal bowel sounds, no hepatosplenomegaly Extremities: no clubbing, cyanosis or edema; 2+ radial, ulnar and brachial pulses bilaterally; 2+ right femoral, posterior tibial and dorsalis pedis pulses; 2+ left femoral, posterior tibial and dorsalis pedis pulses; no subclavian or femoral bruits Neurological: grossly nonfocal  EKG: Sinus rhythm with PACs, right bundle branch bloc  Lipid Panel  No results found for: CHOL, TRIG, HDL, CHOLHDL, VLDL, LDLCALC, LDLDIRECT  BMET    Component Value Date/Time   NA 141 11/19/2010  0814   K 4.4 11/19/2010 0814   CL 105 11/19/2010 0814   CO2 30 11/19/2010 0814   GLUCOSE 98 11/19/2010 0814   BUN 23 11/19/2010 0814   CREATININE 1.23 11/19/2010 0814   CALCIUM 9.2 11/19/2010 0814   GFRNONAA 56* 11/19/2010 0814   GFRAA >60 11/19/2010 0814     ASSESSMENT AND PLAN  No clinical recurrence of atrial fibrillation in >12 months, only one event (during respiratory acute illness) since ablation.  Continue ASA and very low dose beta blocker (may have to stop if he develops symptomatic sinus bradycardia). Yearly follow up.  Orders Placed This Encounter  Procedures  . EKG 12-Lead   Meds ordered this encounter  Medications  . sulfamethoxazole-trimethoprim (BACTRIM DS,SEPTRA DS) 800-160 MG per tablet    Sig: Take 1 tablet by mouth 2 (two) times daily.     Refill:  0  . cetirizine (ZYRTEC) 10  MG tablet    Sig: Take 10 mg by mouth at bedtime.  . metoprolol tartrate (LOPRESSOR) 25 MG tablet    Sig: Take 1/2 tablet twice a day.    Dispense:  90 tablet    Refill:  2    [place]  Sanda Klein, MD, [facility] [phone] office [phone] pager

## 2014-05-06 ENCOUNTER — Other Ambulatory Visit: Payer: Self-pay | Admitting: Cardiovascular Disease

## 2014-05-07 NOTE — Telephone Encounter (Signed)
Rx(s) sent to pharmacy electronically.  

## 2014-07-22 DIAGNOSIS — K8020 Calculus of gallbladder without cholecystitis without obstruction: Secondary | ICD-10-CM | POA: Insufficient documentation

## 2014-07-22 DIAGNOSIS — K760 Fatty (change of) liver, not elsewhere classified: Secondary | ICD-10-CM | POA: Insufficient documentation

## 2014-12-27 ENCOUNTER — Other Ambulatory Visit: Payer: Self-pay | Admitting: Cardiovascular Disease

## 2014-12-27 NOTE — Telephone Encounter (Signed)
Rx(s) sent to pharmacy electronically.  

## 2015-02-01 ENCOUNTER — Ambulatory Visit (INDEPENDENT_AMBULATORY_CARE_PROVIDER_SITE_OTHER): Payer: Medicare HMO | Admitting: Cardiovascular Disease

## 2015-02-01 ENCOUNTER — Encounter: Payer: Self-pay | Admitting: Cardiovascular Disease

## 2015-02-01 VITALS — BP 118/74 | HR 64 | Resp 16 | Ht 71.0 in | Wt 197.8 lb

## 2015-02-01 DIAGNOSIS — I480 Paroxysmal atrial fibrillation: Secondary | ICD-10-CM | POA: Diagnosis not present

## 2015-02-01 NOTE — Progress Notes (Signed)
Patient ID: Samuel Mcconnell, male   DOB: Jul 18, 1927, 81 y.o.   MRN: [id number]     Cardiology Office Note   Date:  02/02/2015   ID:  Samuel Mcconnell, DOB 1927/11/05, MRN [id number]  PCP:  Jamesetta Geralds, MD  Cardiologist:   Sanda Klein, MD   Chief Complaint  Patient presents with  . Annual Exam    No complaints      History of Present Illness: Samuel Mcconnell is a 81 y.o. male who presents for  Follow-up after an episode of paroxysmal atrial fibrillation that occurred roughly 2 years ago. This is the only documented arrhythmic event he has had since a previous remote radiofrequency ablation procedure. He is felt to be at relatively low embolic risk for stroke , despite the fact that he is 81 years old. He has never had any neurological events. He has normal left ventricular systolic and diastolic function and a normal sized left atrium. He has not had bleeding in the past, but prefers to avoid anticoagulants.  He continues to exercise at the gym an hour at a time 3 days a week on the stationary bike and weight machines. He denies exertional dyspnea( at least not more than his usual baseline). He has rare problems with ankle swelling and takes a diuretic once or twice a month as needed.    Past Medical History  Diagnosis Date  . Emphysema   . Atrial fibrillation ([place])     radiofrequency ablation  . Thyroid disease   . Hypertension     Past Surgical History  Procedure Laterality Date  . Cardiac surgery    . Shoulder arthroscopy with rotator cuff repair Left 04/1996  . Knee arthroscopy Left 07/2000  . Colon surgery  1999  . Radiofrequency ablation  06/13/2009  . Loop recorder implant  01/16/2010    Medtronic Reveal XT (Dr. Norlene Duel)   . Tibial tuberclerplasty  ~ 10/2010  . Transthoracic echocardiogram  11/19/2011    EF=>55%; mild MR, mild mitral annular calcif; mild TR, normal RSVP; AV mildly sclerotic, mild AV regurg; trace pulm valve regurg      Current Outpatient  Prescriptions  Medication Sig Dispense Refill  . aspirin 81 MG chewable tablet Chew 2 tablets (162 mg total) by mouth daily.    . cetirizine (ZYRTEC) 10 MG tablet Take 10 mg by mouth at bedtime.    . fluticasone (VERAMYST) 27.5 MCG/SPRAY nasal spray Place 2 sprays into the nose daily.    . Fluticasone-Salmeterol (ADVAIR) 250-50 MCG/DOSE AEPB Inhale 1 puff into the lungs every 12 (twelve) hours.    . furosemide (LASIX) 20 MG tablet Take 1 tablet (20 mg total) by mouth daily as needed. 30 tablet 5  . levothyroxine (SYNTHROID, LEVOTHROID) 100 MCG tablet Take 100 mcg by mouth daily.    . metoprolol tartrate (LOPRESSOR) 25 MG tablet Take 0.5 tablets (12.5 mg total) by mouth 2 (two) times daily. 90 tablet 0  . Misc Natural Products (OSTEO BI-FLEX ADV DOUBLE ST PO) Take by mouth 2 (two) times daily.    . Multiple Vitamin (MULTIVITAMIN) capsule Take 1 capsule by mouth daily.    Marland Kitchen omeprazole (PRILOSEC) 10 MG capsule Take 10 mg by mouth daily.    Marland Kitchen sulfamethoxazole-trimethoprim (BACTRIM DS,SEPTRA DS) 800-160 MG per tablet Take 1 tablet by mouth 2 (two) times daily.   0   No current facility-administered medications for this visit.    Allergies:   Clindamycin/lincomycin; Dabigatran etexilate mesylate; and Doxycycline    Social History:  The patient  reports that he quit smoking about 12 years ago. He has never used smokeless tobacco. He reports that he does not drink alcohol or use illicit drugs.   Family History:  The patient's family history includes Atrial fibrillation in his brother, brother, and sister; Cancer in his maternal grandmother; Diabetes in his sister and son; Heart Problems in his sister; Heart disease in his maternal grandfather; Heart failure in his brother, father, mother, and sister; Hypertension in his sister; Pancreatitis in his son.    ROS:  Please see the history of present illness.    Otherwise, review of systems positive for none.   All other systems are reviewed and  negative.    PHYSICAL EXAM: VS:  BP 118/74 mmHg  Pulse 64  Ht 5\' 11"  (1.803 m)  Wt 197 lb 12.8 oz (89.721 kg)  BMI 27.60 kg/m2 , BMI Body mass index is 27.6 kg/(m^2).  General: Alert, oriented x3, no distress Head: no evidence of trauma, PERRL, EOMI, no exophtalmos or lid lag, no myxedema, no xanthelasma; normal ears, nose and oropharynx Neck: normal jugular venous pulsations and no hepatojugular reflux; brisk carotid pulses without delay and no carotid bruits Chest: clear to auscultation, no signs of consolidation by percussion or palpation, normal fremitus, symmetrical and full respiratory excursions Cardiovascular: normal position and quality of the apical impulse, regular rhythm, normal first and second heart sounds, no  murmurs, rubs or gallops Abdomen: no tenderness or distention, no masses by palpation, no abnormal pulsatility or arterial bruits, normal bowel sounds, no hepatosplenomegaly Extremities: no clubbing, cyanosis or edema; 2+ radial, ulnar and brachial pulses bilaterally; 2+ right femoral, posterior tibial and dorsalis pedis pulses; 2+ left femoral, posterior tibial and dorsalis pedis pulses; no subclavian or femoral bruits Neurological: grossly nonfocal Psych: euthymic mood, full affect   EKG:  EKG is ordered today. The ekg ordered today demonstrates  Normal sinus rhythm a single PAC, right bundle branch block , QRS 148 ms, QTC 455 ms   Recent Labs: No results found for requested labs within last 365 days.    Lipid Panel No results found for: CHOL, TRIG, HDL, CHOLHDL, VLDL, LDLCALC, LDLDIRECT    Wt Readings from Last 3 Encounters:  02/01/15 197 lb 12.8 oz (89.721 kg)  03/08/14 199 lb 8 oz (90.493 kg)  06/20/13 198 lb (89.812 kg)   .   ASSESSMENT AND PLAN:  Remote history of paroxysmal atrial fibrillation status post successful radiofrequency ablation. He has had one recurrent event that occurred during an acute respiratory illness and no new problems in the  last 2 years. Technically his CHADSVasc score is 2,  Because of his age. From a biological point of view he is much younger than his chronological age. He has no evidence of structural cardiac abnormalities and has never had embolic events. We'll continue with aspirin for now but he is to call promptly for even the most brief neurological event or for sustained palpitations.    Current medicines are reviewed at length with the patient today.  The patient does not have concerns regarding medicines.  The following changes have been made:  no change  Labs/ tests ordered today include:  Orders Placed This Encounter  Procedures  . EKG 12-Lead    Patient Instructions  Dr. Sallyanne Kuster recommends that you schedule a follow-up appointment in: ONE YEAR       SignedSanda Klein, MD  02/02/2015 10:41 AM    Sanda Klein, MD, [facility] [phone] office [phone] pager

## 2015-02-01 NOTE — Patient Instructions (Signed)
Dr. Croitoru recommends that you schedule a follow-up appointment in: ONE YEAR   

## 2015-03-11 ENCOUNTER — Ambulatory Visit: Payer: Medicare HMO | Admitting: Cardiovascular Disease

## 2015-03-27 ENCOUNTER — Other Ambulatory Visit: Payer: Self-pay | Admitting: Cardiovascular Disease

## 2015-03-27 NOTE — Telephone Encounter (Signed)
Rx request sent to pharmacy.  

## 2015-10-22 ENCOUNTER — Other Ambulatory Visit: Payer: Self-pay | Admitting: Cardiovascular Disease

## 2016-02-20 ENCOUNTER — Ambulatory Visit (INDEPENDENT_AMBULATORY_CARE_PROVIDER_SITE_OTHER): Payer: Medicare HMO | Admitting: Cardiovascular Disease

## 2016-02-20 ENCOUNTER — Encounter: Payer: Self-pay | Admitting: Cardiovascular Disease

## 2016-02-20 VITALS — BP 110/60 | HR 62 | Ht 72.0 in | Wt 196.0 lb

## 2016-02-20 DIAGNOSIS — R0602 Shortness of breath: Secondary | ICD-10-CM | POA: Diagnosis not present

## 2016-02-20 DIAGNOSIS — I480 Paroxysmal atrial fibrillation: Secondary | ICD-10-CM

## 2016-02-20 MED ORDER — FLUTICASONE-SALMETEROL 250-50 MCG/DOSE IN AEPB: 1.0000 | INHALATION_SPRAY | Freq: Two times a day (BID) | RESPIRATORY_TRACT | 5 refills | Status: DC

## 2016-02-20 MED ORDER — ALBUTEROL SULFATE HFA 108 (90 BASE) MCG/ACT IN AERS: 2.0000 | INHALATION_SPRAY | Freq: Four times a day (QID) | RESPIRATORY_TRACT | 3 refills | Status: DC | PRN

## 2016-02-20 NOTE — Patient Instructions (Signed)
Medication Instructions: Dr Sallyanne Kuster has recommended making the following medication changes: 1. INCREASE Advair to 1 puff TWICE daily  Labwork: NONE ORDERED  Testing/Procedures: 1. Echocardiogram - Your physician has requested that you have an echocardiogram. Echocardiography is a painless test that uses sound waves to create images of your heart. It provides your doctor with information about the size and shape of your heart and how well your heart's chambers and valves are working. This procedure takes approximately one hour. There are no restrictions for this procedure. This will be performed at our [facility] location - [address], [address].  Follow-up: Dr Sallyanne Kuster recommends that you schedule a follow-up appointment in 1 year. You will receive a reminder letter in the mail two months in advance. If you don't receive a letter, please call our office to schedule the follow-up appointment.  If you need a refill on your cardiac medications before your next appointment, please call your pharmacy.

## 2016-02-20 NOTE — Progress Notes (Signed)
Cardiology Office Note    Date:  02/21/2016   ID:  Samuel Mcconnell, DOB 1927-06-12, MRN [id number]  PCP:  Samuel Geralds, MD  Cardiologist:   Samuel Klein, MD   Chief Complaint  Patient presents with  . Follow-up    History of Present Illness:  Samuel Mcconnell is a 82 y.o. male with a single documented episode of paroxysmal atrial fibrillation that occurred approximately 3 years ago. Many years before that he had radiofrequency ablation and this is the only event that has occurred since that time.  He has not had any noticeable arrhythmia since his last appointment, he denies palpitations or rapid heart beats. He has recently noticed some shortness of breath with activity that appears to be related to wheezing. It seems to be related to exposure to certain allergens outdoors and maybe to the weather. He has been taking Advair only once daily. He has an albuterol inhaler that expired in 2014. He has mild and chronic right ankle swelling, following a local infection that occurred decades ago.  He denies angina, syncope, orthopnea, PND, left leg edema, claudication or other cardiovascular complaints.  His last echocardiogram in 2013 showed normal left ventricular size, thickness and function with an ejection fraction of 55% and normal diastolic parameters. The left atrium was not dilated (neither was the right atrium). No significant valvular abnormalities were seen.  He has hypertension, COPD, treated hypothyroidism and acid reflux. He has a long-standing right bundle branch block.  Past Medical History:  Diagnosis Date  . Atrial fibrillation ([place])    radiofrequency ablation  . Emphysema   . Hypertension   . Thyroid disease     Past Surgical History:  Procedure Laterality Date  . CARDIAC SURGERY    . COLON SURGERY  1999  . KNEE ARTHROSCOPY Left 07/2000  . LOOP RECORDER IMPLANT  01/16/2010   Medtronic Reveal XT (Dr. Norlene Mcconnell)   . RADIOFREQUENCY ABLATION  06/13/2009  .  SHOULDER ARTHROSCOPY WITH ROTATOR CUFF REPAIR Left 04/1996  . TIBIAL TUBERCLERPLASTY  ~ 10/2010  . TRANSTHORACIC ECHOCARDIOGRAM  11/19/2011   EF=>55%; mild MR, mild mitral annular calcif; mild TR, normal RSVP; AV mildly sclerotic, mild AV regurg; trace pulm valve regurg     Current Medications: Outpatient Medications Prior to Visit  Medication Sig Dispense Refill  . aspirin 81 MG chewable tablet Chew 2 tablets (162 mg total) by mouth daily.    . cetirizine (ZYRTEC) 10 MG tablet Take 10 mg by mouth at bedtime.    . fluticasone (VERAMYST) 27.5 MCG/SPRAY nasal spray Place 2 sprays into the nose daily.    . furosemide (LASIX) 20 MG tablet TAKE 1 TABLET (20 MG TOTAL) BY MOUTH DAILY AS NEEDED. 30 tablet 2  . levothyroxine (SYNTHROID, LEVOTHROID) 100 MCG tablet Take 100 mcg by mouth daily.    . metoprolol tartrate (LOPRESSOR) 25 MG tablet TAKE 0.5 TABLETS (12.5 MG TOTAL) BY MOUTH 2 (TWO) TIMES DAILY. 90 tablet 3  . Misc Natural Products (OSTEO BI-FLEX ADV DOUBLE ST PO) Take by mouth 2 (two) times daily.    . Multiple Vitamin (MULTIVITAMIN) capsule Take 1 capsule by mouth daily.    Marland Kitchen omeprazole (PRILOSEC) 10 MG capsule Take 10 mg by mouth daily.    Marland Kitchen sulfamethoxazole-trimethoprim (BACTRIM DS,SEPTRA DS) 800-160 MG per tablet Take 1 tablet by mouth 2 (two) times daily.   0  . Fluticasone-Salmeterol (ADVAIR) 250-50 MCG/DOSE AEPB Inhale 1 puff into the lungs daily.      No facility-administered medications  prior to visit.      Allergies:   Clindamycin/lincomycin; Dabigatran etexilate mesylate; and Doxycycline   Social History   Social History  . Marital status: Married    Spouse name: N/A  . Number of children: 2  . Years of education: N/A   Social History Main Topics  . Smoking status: Former Smoker    Years: 46.00    Quit date: 03/14/2002  . Smokeless tobacco: Never Used     Comment: 40 pack year history   . Alcohol use No  . Drug use: No  . Sexual activity: Not Asked   Other Topics  Concern  . None   Social History Narrative  . None     Family History:  The patient's family history includes Atrial fibrillation in his brother, brother, and sister; Cancer in his maternal grandmother; Diabetes in his sister and son; Heart Problems in his sister; Heart disease in his maternal grandfather; Heart failure in his brother, father, mother, and sister; Hypertension in his sister; Pancreatitis in his son.   ROS:   Please see the history of present illness.    ROS All other systems reviewed and are negative.   PHYSICAL EXAM:   VS:  BP 110/60   Pulse 62   Ht 6' (1.829 m)   Wt 196 lb (88.9 kg)   SpO2 98%   BMI 26.58 kg/m    GEN: Well nourished, well developed, in no acute distress  HEENT: normal  Neck: no JVD, carotid bruits, or masses Cardiac: Widely split S2, RRR; no murmurs, rubs, or gallops,no edema  Respiratory: Distant breath sounds, but without wheezing and clear to auscultation bilaterally, normal work of breathing GI: soft, nontender, nondistended, + BS MS: no deformity or atrophy  Skin: warm and dry, no rash Neuro:  Alert and Oriented x 3, Strength and sensation are intact Psych: euthymic mood, full affect  Wt Readings from Last 3 Encounters:  02/20/16 196 lb (88.9 kg)  02/01/15 197 lb 12.8 oz (89.7 kg)  03/08/14 199 lb 8 oz (90.5 kg)      Studies/Labs Reviewed:   EKG:  EKG is ordered today.  The ekg ordered today demonstrates Normal sinus rhythm, right bundle branch block, QTC 468  Recent Labs: Nov 2017 normal routine chemistries and liver function tests, creatinine 1.0, glucose 96, TSH 2.34, hemoglobin 14.2  Lipid Panel April 2017 cholesterol 153, triglycerides 144, HDL 35, LDL 89  Additional studies/ records that were reviewed today include:  Notes from Dr. Salvadore Mcconnell and Samuel Mcconnell.  ASSESSMENT:    1. PAF (paroxysmal atrial fibrillation) ([place])   2. Shortness of breath      PLAN:  In order of problems listed above:  1. AFib: Following  his radiofrequency ablation the episodes have been extremely infrequent. Only one episode occurred during an acute respiratory illness. No subsequent events in 3 years. His embolic risk is relatively low, increased exclusively due to his age (CHADSVasc 2). For the time being, unless there is recurrent arrhythmia will continue with aspirin only. 2. COPD: His pattern of dyspnea is more in keeping with COPD, rather than heart failure. We'll repeat an echocardiogram to make sure. Increase the Advair to 1 puff twice daily. Gave him a new prescription for rescue albuterol. Reminded him that Advair should not be used as a rescue inhaler. If echo is normal, consider switching from metoprolol to diltiazem or verapamil.    Medication Adjustments/Labs and Tests Ordered: Current medicines are reviewed at length with the patient today.  Concerns regarding medicines are outlined above.  Medication changes, Labs and Tests ordered today are listed in the Patient Instructions below. Patient Instructions  Medication Instructions: Dr Sallyanne Kuster has recommended making the following medication changes: 1. INCREASE Advair to 1 puff TWICE daily  Labwork: NONE ORDERED  Testing/Procedures: 1. Echocardiogram - Your physician has requested that you have an echocardiogram. Echocardiography is a painless test that uses sound waves to create images of your heart. It provides your doctor with information about the size and shape of your heart and how well your heart's chambers and valves are working. This procedure takes approximately one hour. There are no restrictions for this procedure. This will be performed at our [facility] location - [address], [address].  Follow-up: Dr Sallyanne Kuster recommends that you schedule a follow-up appointment in 1 year. You will receive a reminder letter in the mail two months in advance. If you don't receive a letter, please call our office to schedule the follow-up appointment.  If you need  a refill on your cardiac medications before your next appointment, please call your pharmacy.    Signed, Samuel Klein, MD  02/21/2016 12:40 PM    [address], [place], [place]  [address] Phone: [phone]; Fax: [phone]

## 2016-03-08 ENCOUNTER — Other Ambulatory Visit: Payer: Self-pay | Admitting: Cardiovascular Disease

## 2016-03-24 ENCOUNTER — Telehealth: Payer: Self-pay | Admitting: Cardiovascular Disease

## 2016-03-24 NOTE — Telephone Encounter (Signed)
Samuel Mcconnell is calling in regards to receiving approval from Dr.Croitoru for [facility] to approve Echocardiogram. Please call, thanks.

## 2016-03-25 NOTE — Telephone Encounter (Signed)
Call routed to Pre-cert team.

## 2016-03-26 ENCOUNTER — Ambulatory Visit (HOSPITAL_COMMUNITY): Payer: Medicare HMO | Attending: Cardiovascular Disease

## 2016-03-26 ENCOUNTER — Other Ambulatory Visit: Payer: Self-pay

## 2016-03-26 DIAGNOSIS — I517 Cardiomegaly: Secondary | ICD-10-CM | POA: Insufficient documentation

## 2016-03-26 DIAGNOSIS — I351 Nonrheumatic aortic (valve) insufficiency: Secondary | ICD-10-CM | POA: Diagnosis not present

## 2016-03-26 DIAGNOSIS — R0602 Shortness of breath: Secondary | ICD-10-CM | POA: Diagnosis not present

## 2016-03-26 DIAGNOSIS — R0600 Dyspnea, unspecified: Secondary | ICD-10-CM | POA: Diagnosis present

## 2016-03-26 DIAGNOSIS — Z87891 Personal history of nicotine dependence: Secondary | ICD-10-CM | POA: Diagnosis not present

## 2016-03-26 LAB — ECHOCARDIOGRAM COMPLETE
AOASC: 34 cm
E decel time: 292 msec
E/e' ratio: 10.22
FS: 49 % — AB (ref 28–44)
IVS/LV PW RATIO, ED: 1.15
LA ID, A-P, ES: 36 mm
LA diam end sys: 36 mm
LA diam index: 1.71 cm/m2
LA vol A4C: 60.9 ml
LAVOL: 72.2 mL
LAVOLIN: 34.2 mL/m2
LV E/e' medial: 10.22
LV E/e'average: 10.22
LV PW d: 13.1 mm — AB (ref 0.6–1.1)
LV TDI E'LATERAL: 9.16
LV e' LATERAL: 9.16 cm/s
LVOT SV: 94 mL
LVOT VTI: 27.2 cm
LVOT area: 3.46 cm2
LVOT diameter: 21 mm
LVOTPV: 110 cm/s
MV Dec: 292
MV Peak grad: 4 mmHg
MV pk A vel: 96.5 m/s
MV pk E vel: 93.6 m/s
P 1/2 time: 498 ms
RV LATERAL S' VELOCITY: 12.4 cm/s
TAPSE: 29.6 mm
TDI e' medial: 7.22

## 2016-03-28 DIAGNOSIS — I517 Cardiomegaly: Secondary | ICD-10-CM | POA: Insufficient documentation

## 2016-04-21 ENCOUNTER — Encounter: Payer: Self-pay | Admitting: Cardiovascular Disease

## 2016-04-21 ENCOUNTER — Other Ambulatory Visit: Payer: Self-pay

## 2016-04-21 NOTE — Telephone Encounter (Signed)
See documentation encounter from today's date.   Patient called in w/ concerns regarding cost of Advair inhaler.   Spoke with patient. States last year he only paid $47 for inhaler and went to pick up prescription this month and it was $200+.  Called pharmacy, spoke with Suezanne Jacquet (who per pt, was very familiar with his case). Inquired whether a PA was needed and if that explained the price variance. Per [place], no PA is needed. Patient is just paying his deductible because it is the first of the year.   Returned call to patient. Notified him of pharmacy comments. Patient states he has never paid a deductible and that this is not new insurance to him. I advised that he contact his insurance company as they will be the best to contact for answers and assistance. Patient verbalized understanding and agreed with plan.

## 2016-04-21 NOTE — Progress Notes (Signed)
lmtcb

## 2016-05-04 ENCOUNTER — Emergency Department
Admission: EM | Admit: 2016-05-04 | Discharge: 2016-05-04 | Disposition: A | Discharge status: Home / Self Care | Payer: Medicare HMO | Source: Home / Self Care | Attending: Family Medicine | Admitting: Family Medicine

## 2016-05-04 ENCOUNTER — Encounter: Payer: Self-pay | Admitting: Emergency Medicine

## 2016-05-04 DIAGNOSIS — B9789 Other viral agents as the cause of diseases classified elsewhere: Secondary | ICD-10-CM | POA: Diagnosis not present

## 2016-05-04 DIAGNOSIS — J069 Acute upper respiratory infection, unspecified: Secondary | ICD-10-CM

## 2016-05-04 MED ORDER — AZITHROMYCIN 250 MG PO TABS: ORAL_TABLET | ORAL | 0 refills | Status: DC

## 2016-05-04 MED ORDER — METHYLPREDNISOLONE ACETATE 80 MG/ML IJ SUSP
80.0000 mg | Freq: Once | INTRAMUSCULAR | Status: AC
  Administered 2016-05-04: 80 mg via INTRAMUSCULAR

## 2016-05-04 MED ORDER — GUAIFENESIN-CODEINE 100-10 MG/5ML PO SOLN: ORAL | 0 refills | Status: DC

## 2016-05-04 NOTE — Discharge Instructions (Signed)
Take plain guaifenesin (1200mg extended release tabs such as Mucinex) twice daily, with plenty of water, for cough and congestion.  Get adequate rest.   °May use Afrin nasal spray (or generic oxymetazoline) twice daily for about 5 days and then discontinue.  Also recommend using saline nasal spray several times daily and saline nasal irrigation (AYR is a common brand).  Use Flonase nasal spray each morning after using Afrin nasal spray and saline nasal irrigation. °Try warm salt water gargles for sore throat.  °Stop all antihistamines for now, and other non-prescription cough/cold preparations. °Continue inhalers as prescribed. °Follow-up with family doctor if not improving about one week. °

## 2016-05-04 NOTE — ED Triage Notes (Signed)
Productive cough with congestion, yellow mucus x 3 days

## 2016-05-04 NOTE — ED Provider Notes (Signed)
[name]    CSN: [id number] Arrival date & time: 05/04/16  1038     History   Chief Complaint Chief Complaint  Patient presents with  . Cough    HPI Samuel Mcconnell is a 83 y.o. male.   Patient complains of three to four day history of typical cold-like symptoms developing over several days, including mild sore throat, sinus congestion, mild fatigue, and cough.  No fevers, chills, and sweats. He has a history of COPD and uses Advair daily, but has not had to use his albuterol MDI more frequently.     The history is provided by the patient.    Past Medical History:  Diagnosis Date  . Atrial fibrillation ([place])    radiofrequency ablation  . Emphysema   . Hypertension   . Thyroid disease     Patient Active Problem List   Diagnosis Date Noted  . PAF (paroxysmal atrial fibrillation) ([place]) 03/15/2013    Past Surgical History:  Procedure Laterality Date  . CARDIAC SURGERY    . COLON SURGERY  1999  . KNEE ARTHROSCOPY Left 07/2000  . LOOP RECORDER IMPLANT  01/16/2010   Medtronic Reveal XT (Dr. Norlene Duel)   . RADIOFREQUENCY ABLATION  06/13/2009  . SHOULDER ARTHROSCOPY WITH ROTATOR CUFF REPAIR Left 04/1996  . TIBIAL TUBERCLERPLASTY  ~ 10/2010  . TRANSTHORACIC ECHOCARDIOGRAM  11/19/2011   EF=>55%; mild MR, mild mitral annular calcif; mild TR, normal RSVP; AV mildly sclerotic, mild AV regurg; trace pulm valve regurg        Home Medications    Prior to Admission medications   Medication Sig Start Date End Date Taking? Authorizing Provider  albuterol (PROVENTIL HFA;VENTOLIN HFA) 108 (90 Base) MCG/ACT inhaler Inhale 2 puffs into the lungs every 6 (six) hours as needed for wheezing or shortness of breath. 02/20/16   Mihai Croitoru, MD  aspirin 81 MG chewable tablet Chew 2 tablets (162 mg total) by mouth daily. 03/15/13   Mihai Croitoru, MD  azithromycin (ZITHROMAX Z-PAK) 250 MG tablet Take 2 tabs today; then begin one tab once daily for 4 more days. 05/04/16    Kandra Nicolas, MD  cetirizine (ZYRTEC) 10 MG tablet Take 10 mg by mouth at bedtime.    Historical Provider, MD  dicyclomine (BENTYL) 20 MG tablet Take 20 mg by mouth daily. 12/14/14   Historical Provider, MD  fluticasone (VERAMYST) 27.5 MCG/SPRAY nasal spray Place 2 sprays into the nose daily.    Historical Provider, MD  Fluticasone-Salmeterol (ADVAIR) 250-50 MCG/DOSE AEPB Inhale 1 puff into the lungs 2 (two) times daily. 02/20/16   Mihai Croitoru, MD  furosemide (LASIX) 20 MG tablet TAKE 1 TABLET (20 MG TOTAL) BY MOUTH DAILY AS NEEDED. 10/22/15   Dani Gobble Croitoru, MD  guaiFENesin-codeine 100-10 MG/5ML syrup Take 53mL by mouth at bedtime as needed for cough 05/04/16   Kandra Nicolas, MD  levothyroxine (SYNTHROID, LEVOTHROID) 100 MCG tablet Take 100 mcg by mouth daily. 11/02/14   Historical Provider, MD  metoprolol tartrate (LOPRESSOR) 25 MG tablet TAKE 0.5 TABLETS (12.5 MG TOTAL) BY MOUTH 2 (TWO) TIMES DAILY. 03/09/16   Mihai Croitoru, MD  Misc Natural Products (OSTEO BI-FLEX ADV DOUBLE ST PO) Take by mouth 2 (two) times daily.    Historical Provider, MD  Multiple Vitamin (MULTIVITAMIN) capsule Take 1 capsule by mouth daily.    Historical Provider, MD  omeprazole (PRILOSEC) 10 MG capsule Take 10 mg by mouth daily.    Historical Provider, MD    Family History  Family History  Problem Relation Age of Onset  . Heart failure Father     MI - died @ 65  . Heart failure Mother   . Diabetes Son   . Pancreatitis Son     also ETOH abuse   . Cancer Maternal Grandmother   . Heart disease Maternal Grandfather   . Atrial fibrillation Brother     dx'ed age 10  . Atrial fibrillation Brother     dx'ed age 67  . Heart failure Brother   . Atrial fibrillation Sister   . Hypertension Sister   . Heart Problems Sister   . Diabetes Sister   . Heart failure Sister     Social History Social History  Substance Use Topics  . Smoking status: Former Smoker    Years: 46.00    Quit date: 03/14/2002  .  Smokeless tobacco: Never Used     Comment: 40 pack year history   . Alcohol use No     Allergies   Clindamycin/lincomycin; Dabigatran etexilate mesylate; and Doxycycline   Review of Systems Review of Systems + sore throat + cough No pleuritic pain No wheezing + nasal congestion + post-nasal drainage No sinus pain/pressure No itchy/red eyes No earache No hemoptysis No SOB No fever/chills No nausea No vomiting No abdominal pain No diarrhea No urinary symptoms No skin rash + fatigue No myalgias No headache Used OTC meds without relief  Physical Exam Triage Vital Signs ED Triage Vitals  Enc Vitals Group     BP 05/04/16 1152 150/78     Pulse Rate 05/04/16 1152 67     Resp --      Temp 05/04/16 1152 97.4 F (36.3 C)     Temp Source 05/04/16 1152 Oral     SpO2 05/04/16 1152 95 %     Weight 05/04/16 1152 200 lb (90.7 kg)     Height 05/04/16 1152 6' (1.829 m)     Head Circumference --      Peak Flow --      Pain Score 05/04/16 1154 1     Pain Loc --      Pain Edu? --      Excl. in [place]? --    No data found.   Updated Vital Signs BP 150/78 (BP Location: Left Arm)   Pulse 67   Temp 97.4 F (36.3 C) (Oral)   Ht 6' (1.829 m)   Wt 200 lb (90.7 kg)   SpO2 95%   BMI 27.12 kg/m   Visual Acuity Right Eye Distance:   Left Eye Distance:   Bilateral Distance:    Right Eye Near:   Left Eye Near:    Bilateral Near:     Physical Exam Nursing notes and Vital Signs reviewed. Appearance:  Patient appears stated age, and in no acute distress Eyes:  Pupils are equal, round, and reactive to light and accomodation.  Extraocular movement is intact.  Conjunctivae are not inflamed  Ears:  Canals normal.  Tympanic membranes normal.  Nose:  Mildly congested turbinates.  No sinus tenderness.   Pharynx:  Normal Neck:  Supple.  Tender enlarged posterior/lateral nodes are palpated bilaterally  Lungs:  Clear to auscultation.  Breath sounds are equal.  Moving air  well. Heart:  Regular rate and rhythm without murmurs, rubs, or gallops.  Abdomen:  Nontender without masses or hepatosplenomegaly.  Bowel sounds are present.  No CVA or flank tenderness.  Extremities:  No edema.  Skin:  No rash present.  UC Treatments / Results  Labs (all labs ordered are listed, but only abnormal results are displayed) Labs Reviewed - No data to display  EKG  EKG Interpretation None       Radiology No results found.  Procedures Procedures (including critical care time)  Medications Ordered in UC Medications  methylPREDNISolone acetate (DEPO-MEDROL) injection 80 mg (not administered)     Initial Impression / Assessment and Plan / UC Course  I have reviewed the triage vital signs and the nursing notes.  Pertinent labs & imaging results that were available during my care of the patient were reviewed by me and considered in my medical decision making (see chart for details).    Because of history of COPD, administered Depo Medrol 80mg  IM  Begin empiric Z-pack for atypical coverage.  Rx for Robitussin AC for night time cough.  Take plain guaifenesin (1200mg  extended release tabs such as Mucinex) twice daily, with plenty of water, for cough and congestion.  Get adequate rest.   May use Afrin nasal spray (or generic oxymetazoline) twice daily for about 5 days and then discontinue.  Also recommend using saline nasal spray several times daily and saline nasal irrigation (AYR is a common brand).  Use Flonase nasal spray each morning after using Afrin nasal spray and saline nasal irrigation. Try warm salt water gargles for sore throat.  Stop all antihistamines for now, and other non-prescription cough/cold preparations. Continue inhalers as prescribed. Follow-up with family doctor if not improving about one week.    Final Clinical Impressions(s) / UC Diagnoses   Final diagnoses:  Viral URI with cough    New Prescriptions New Prescriptions   AZITHROMYCIN  (ZITHROMAX Z-PAK) 250 MG TABLET    Take 2 tabs today; then begin one tab once daily for 4 more days.   GUAIFENESIN-CODEINE 100-10 MG/5ML SYRUP    Take 48mL by mouth at bedtime as needed for cough     Kandra Nicolas, MD 05/04/16 1249

## 2016-10-25 ENCOUNTER — Other Ambulatory Visit: Payer: Self-pay | Admitting: Cardiovascular Disease

## 2016-11-18 ENCOUNTER — Other Ambulatory Visit: Payer: Self-pay | Admitting: Cardiovascular Disease

## 2016-11-18 NOTE — Telephone Encounter (Signed)
REFILL 

## 2016-11-22 ENCOUNTER — Other Ambulatory Visit: Payer: Self-pay | Admitting: Cardiovascular Disease

## 2016-11-24 ENCOUNTER — Telehealth: Payer: Self-pay | Admitting: Cardiovascular Disease

## 2016-11-24 DIAGNOSIS — Z79899 Other long term (current) drug therapy: Secondary | ICD-10-CM

## 2016-11-24 DIAGNOSIS — R5383 Other fatigue: Secondary | ICD-10-CM

## 2016-11-24 DIAGNOSIS — R002 Palpitations: Secondary | ICD-10-CM

## 2016-11-24 MED ORDER — APIXABAN 5 MG PO TABS: 5.0000 mg | ORAL_TABLET | Freq: Two times a day (BID) | ORAL | 5 refills | Status: DC

## 2016-11-24 MED ORDER — METOPROLOL TARTRATE 25 MG PO TABS: 25.0000 mg | ORAL_TABLET | Freq: Two times a day (BID) | ORAL | 5 refills | Status: DC

## 2016-11-24 NOTE — Telephone Encounter (Signed)
I called patient to give recommendations.  Pt denies current bleeding/surgical issues.  Pt understands to start Eliquis 5mg  BID.  I called this in to pharmacy as well as communicating discount code information to the pharmacy technician - the discount was applied for patient's rx.  Relayed instructions regarding labwork, event monitor, and f/u. Pt aware I have entered orders for the TSH and BMET as well as the 30 day event monitor - will have scheduler arrange these on same day, as well as get patient in for post-monitor f/u w MD or APP.  Dr. Sallyanne Kuster -- Does he need to go ahead and d/c ASA?

## 2016-11-24 NOTE — Telephone Encounter (Signed)
Please ask him to increase his metoprolol to 25 mg twice daily (a whole tablet twice daily). Please ask him about any recent bleeding, injury or surgical problems. While we clarify whether or not those episodes were truly atrial fibrillation, it is probably safest for him to go back on anticoagulants. I recommend Eliquis 5 mg twice daily. We can give him a 30 day free card. He would stop the aspirin when he starts the new medication. Please have him wear a 30 day event monitor, check BMET and TSH and then come and see me or APP in clinic. MCr

## 2016-11-24 NOTE — Telephone Encounter (Signed)
Also discussed recommendation for metoprolol increase - this was discussed at same time as other changes. Instructed pt to follow BP and HR w change and note if any concerns.

## 2016-11-24 NOTE — Telephone Encounter (Signed)
Wife answered, left msg w her requesting that patient call me back.

## 2016-11-24 NOTE — Telephone Encounter (Signed)
New Message     Pt c/o BP issue: STAT if pt c/o blurred vision, one-sided weakness or slurred speech  1. What are your last 5 BP readings? 182/78 pulse 82  2. Are you having any other symptoms (ex. Dizziness, headache, blurred vision, passed out)?   no  3. What is your BP issue?  Has had 2 events of afib last week and he would like to speak with Dr C nurse as soon as possible

## 2016-11-24 NOTE — Telephone Encounter (Signed)
Pt of Dr. Sallyanne Kuster  I spoke to patient regarding concerns.  Pt states he went into 2 episodes of A Fib last week. Each lasted several hours. Each time it occurred, happened in the evening after working outside in the yard. States he was "worn out from being outside". He used PRN inhaler and laid down and eventually symptoms resolved late night/early AM (he states "more than a couple hours" in duration)  Doesn't state he took any extra rate controlling medication. Pt had fatigue each time.  Also concerned as he doesn't have a reliable home BP cuff, but checked his pressure at [facility] yesterday and had high reading of 182/78. "higher than it's ever been in a long time".  No symptoms today.  Informed pt I'd review w Dr. Sallyanne Kuster to see if any med changes advised, work-in available, or if APP visit OK - (pt fine with any option)  ----------------------------- He did state he has an ENT appt Thursday at 2:30pm - this is his only schedule conflict.

## 2016-11-24 NOTE — Telephone Encounter (Signed)
Follow up     Pt is returning [place] call

## 2016-11-25 NOTE — Telephone Encounter (Signed)
Attempted to reach patient again, left msg to call.

## 2016-11-25 NOTE — Telephone Encounter (Signed)
Yes, stop aspirin when he starts Eliquis. MCr

## 2016-11-25 NOTE — Telephone Encounter (Signed)
Pt says he is taking the Eliquis and not the Aspirin,this is correct?

## 2016-11-25 NOTE — Telephone Encounter (Signed)
Called patient, he acknowledged instruction and verbalized understanding.

## 2016-11-25 NOTE — Telephone Encounter (Signed)
Left msg w recommendation of change and advised to call back to confirm understanding of instructions.

## 2016-11-26 ENCOUNTER — Telehealth: Payer: Self-pay | Admitting: Cardiovascular Disease

## 2016-11-26 NOTE — Telephone Encounter (Signed)
Called patient and set up monitor and lab appointments.  Also Dr. Victorino December appointment after monitor.  Mailed out calendars for all appointments to the patient.

## 2016-12-01 ENCOUNTER — Other Ambulatory Visit: Payer: Self-pay

## 2016-12-02 NOTE — Telephone Encounter (Signed)
Please refer to PCP [facility]

## 2016-12-09 ENCOUNTER — Ambulatory Visit (INDEPENDENT_AMBULATORY_CARE_PROVIDER_SITE_OTHER): Payer: Medicare HMO

## 2016-12-09 ENCOUNTER — Other Ambulatory Visit: Payer: Medicare HMO | Admitting: *Deleted

## 2016-12-09 DIAGNOSIS — R002 Palpitations: Secondary | ICD-10-CM | POA: Diagnosis not present

## 2016-12-09 DIAGNOSIS — R5383 Other fatigue: Secondary | ICD-10-CM | POA: Diagnosis not present

## 2016-12-09 DIAGNOSIS — I480 Paroxysmal atrial fibrillation: Secondary | ICD-10-CM

## 2016-12-09 LAB — BASIC METABOLIC PANEL
BUN/Creatinine Ratio: 16 (ref 10–24)
BUN: 19 mg/dL (ref 8–27)
CO2: 26 mmol/L (ref 20–29)
Calcium: 9 mg/dL (ref 8.6–10.2)
Chloride: 101 mmol/L (ref 96–106)
Creatinine, Ser: 1.16 mg/dL (ref 0.76–1.27)
GFR calc Af Amer: 65 mL/min/{1.73_m2} (ref >59)
GFR calc non Af Amer: 56 mL/min/{1.73_m2} — ABNORMAL LOW (ref >59)
GLUCOSE: 93 mg/dL (ref 65–99)
Potassium: 4.4 mmol/L (ref 3.5–5.2)
SODIUM: 140 mmol/L (ref 134–144)

## 2016-12-09 LAB — TSH: TSH: 2.59 u[IU]/mL (ref 0.450–4.500)

## 2016-12-09 NOTE — Progress Notes (Signed)
Sh

## 2016-12-10 ENCOUNTER — Other Ambulatory Visit: Payer: Self-pay | Admitting: Cardiovascular Disease

## 2016-12-14 ENCOUNTER — Telehealth: Payer: Self-pay | Admitting: Cardiovascular Disease

## 2016-12-14 NOTE — Telephone Encounter (Signed)
New Message  Pt call requesting to speak with RN. Pt states his health recorder is not working and would like to discuss further with RN. Please call back to discuss

## 2016-12-14 NOTE — Telephone Encounter (Signed)
Pt of Dr. Sallyanne Kuster  He was calling as an FYI to report 2 things  1) He is wearing 30 day event monitor. Had some problems with the monitor not picking up. He has already contacted [employer], did troubleshooting over the phone, and gotten replacement wires in the mail today. Will follow up with them if any further problems.  2) Notes additionally, he had "an attack" Friday night, describes as getting "the shakes". Wife took him to [facility] ED.  Pt had temp of 101. Had X-rays, labwork, etc. Determined no pneumonia but "some kind of infection".  Was in ED for a couple of hours and sent home. now on Levaquin x 7 days.  I advised him to f/u w PCP - patient states he'll follow up with PCP today.  Informed pt I would check to see if any concerns for med interactions w Levaquin and his current regimen.  Patient states he does not need a call back from [place] unless Dr. Sallyanne Kuster has advice.

## 2016-12-14 NOTE — Telephone Encounter (Signed)
Clarified w patient. He wasn't worried about heart rhythm/rate concerns on Friday night, just wanted to let us know he had been in hospital. No further concerns at this time.

## 2016-12-14 NOTE — Telephone Encounter (Signed)
There is no direct conflict between his heart medicines and the levofloxacin. It does sound like he had an infection on Friday night, not an arrhythmia. MCr

## 2017-01-05 ENCOUNTER — Telehealth: Payer: Self-pay | Admitting: Cardiovascular Disease

## 2017-01-05 NOTE — Telephone Encounter (Signed)
Returned call to patient of Dr. Sallyanne Kuster. He is on his last day of his heart monitor and wanted to confirm he was to send it back via [employer]. Advised this is correct. He states he missed 3 days of wearing the monitor d/t wiring issues. Patient aware he will be notified of the results once MD has reviewed report.

## 2017-01-05 NOTE — Telephone Encounter (Signed)
Please call,he needs to tell you some things to tell Dr C.

## 2017-01-06 ENCOUNTER — Other Ambulatory Visit: Payer: Self-pay | Admitting: Cardiovascular Disease

## 2017-01-19 ENCOUNTER — Other Ambulatory Visit: Payer: Self-pay | Admitting: Cardiovascular Disease

## 2017-01-25 ENCOUNTER — Encounter: Payer: Self-pay | Admitting: Cardiology

## 2017-01-25 ENCOUNTER — Ambulatory Visit: Payer: Medicare HMO | Admitting: Cardiology

## 2017-01-25 DIAGNOSIS — I480 Paroxysmal atrial fibrillation: Secondary | ICD-10-CM

## 2017-01-25 NOTE — Patient Instructions (Addendum)
Medication Instructions:  Your physician has recommended you make the following change in your medication:  1.  STOP the Eliquis 2.  START Aspirin 81 mg daily  Labwork: None ordered  Testing/Procedures: None ordered  Follow-Up: Your physician wants you to follow-up in: [place] will receive a reminder letter in the mail two months in advance. If you don't receive a letter, please call our office to schedule the follow-up appointment.    Any Other Special Instructions Will Be Listed Below (If Applicable).     If you need a refill on your cardiac medications before your next appointment, please call your pharmacy.

## 2017-01-25 NOTE — Progress Notes (Signed)
01/25/2017 Samuel Mcconnell   [birth year]  [id number]  Primary Physician Radiontchenko, [place] S[place], MD Primary Cardiologist: Dr Sallyanne Kuster  HPI:  83 y.o. male with a single documented episode of paroxysmal atrial fibrillation that occurred approximately 3 years ago in the setting of CAP. Many years before that he had radiofrequency ablation and that was the only event that had occurred since that time. recently he called complaining of palpitations. Eliquis was added and lopressor increased. A 30 day monitor was obtained and did not show atrial fibrillation. The pt is in the office for follow up.     Current Outpatient Medications  Medication Sig Dispense Refill  . ADVAIR DISKUS 250-50 MCG/DOSE AEPB INHALE 1 PUFF INTO THE LUNGS 2 (TWO) TIMES DAILY. PLEASE SCHEDULE APPOINTMENT FOR REFILLS 60 each 0  . albuterol (PROVENTIL HFA;VENTOLIN HFA) 108 (90 Base) MCG/ACT inhaler Inhale 2 puffs into the lungs every 6 (six) hours as needed for wheezing or shortness of breath. 1 Inhaler 3  . aspirin 81 MG chewable tablet Chew 2 tablets (162 mg total) by mouth daily.    Marland Kitchen Bioflavonoid Products (BIOFLEX PO) Take 1 tablet daily by mouth.    . cetirizine (ZYRTEC) 10 MG tablet Take 10 mg by mouth at bedtime.    . dicyclomine (BENTYL) 20 MG tablet Take 20 mg by mouth daily.    . fluticasone (VERAMYST) 27.5 MCG/SPRAY nasal spray Place 2 sprays into the nose daily.    . furosemide (LASIX) 20 MG tablet Take 1 tablet (20 mg total) by mouth daily as needed. NEED OV. 30 tablet 0  . levothyroxine (SYNTHROID, LEVOTHROID) 100 MCG tablet Take 100 mcg by mouth daily.    . metoprolol tartrate (LOPRESSOR) 25 MG tablet Take 1 tablet (25 mg total) by mouth 2 (two) times daily. 60 tablet 5  . Multiple Vitamin (MULTIVITAMIN) capsule Take 1 capsule by mouth daily.    Marland Kitchen omeprazole (PRILOSEC) 10 MG capsule Take 10 mg by mouth daily.     No current facility-administered medications for this visit.     Allergies  Allergen Reactions    . Clindamycin/Lincomycin Other (See Comments)    Pt felt weird    . Dabigatran Etexilate Mesylate Other (See Comments)    "felt weird"  . Doxycycline Other (See Comments)    Dyspepsia    Past Medical History:  Diagnosis Date  . Atrial fibrillation ([place])    radiofrequency ablation  . Emphysema   . Hypertension   . Thyroid disease     Social History   Socioeconomic History  . Marital status: Married    Spouse name: Not on file  . Number of children: 2  . Years of education: Not on file  . Highest education level: Not on file  Social Needs  . Financial resource strain: Not on file  . Food insecurity - worry: Not on file  . Food insecurity - inability: Not on file  . Transportation needs - medical: Not on file  . Transportation needs - non-medical: Not on file  Occupational History  . Not on file  Tobacco Use  . Smoking status: Former Smoker    Years: 46.00    Last attempt to quit: 03/14/2002    Years since quitting: 14.8  . Smokeless tobacco: Never Used  . Tobacco comment: 40 pack year history   Substance and Sexual Activity  . Alcohol use: No  . Drug use: No  . Sexual activity: Not on file  Other Topics Concern  . Not on  file  Social History Narrative  . Not on file     Family History  Problem Relation Age of Onset  . Heart failure Father        MI - died @ 93  . Heart failure Mother   . Diabetes Son   . Pancreatitis Son        also ETOH abuse   . Cancer Maternal Grandmother   . Heart disease Maternal Grandfather   . Atrial fibrillation Brother        dx'ed age 52  . Atrial fibrillation Brother        dx'ed age 65  . Heart failure Brother   . Atrial fibrillation Sister   . Hypertension Sister   . Heart Problems Sister   . Diabetes Sister   . Heart failure Sister      Review of Systems: General: negative for chills, fever, night sweats or weight changes.  Cardiovascular: negative for chest pain, dyspnea on exertion, edema, orthopnea,  palpitations, paroxysmal nocturnal dyspnea or shortness of breath Dermatological: negative for rash Respiratory: negative for cough or wheezing Urologic: negative for hematuria Abdominal: negative for nausea, vomiting, diarrhea, bright red blood per rectum, melena, or hematemesis Neurologic: negative for visual changes, syncope, or dizziness All other systems reviewed and are otherwise negative except as noted above.    Blood pressure 140/72, pulse 68, height 5' 11.5" (1.816 m), weight 192 lb 6.4 oz (87.3 kg).  General appearance: alert, cooperative and no distress Neck: no carotid bruit and no JVD Lungs: clear to auscultation bilaterally Heart: regular rate and rhythm Extremities: extremities normal, atraumatic, no cyanosis or edema Skin: Skin color, texture, turgor normal. No rashes or lesions Neurologic: Grossly normal   ASSESSMENT AND PLAN:   PAF (paroxysmal atrial fibrillation) Radio frequency ablation March 2011- One recurrent episode in setting of CAP- 30 day event Oct 2018 showed no PAF (typo in Dr [employer] note)   PLAN  I reviewed monitor with Dr Sallyanne Kuster- his note saying "recurrent atrial fibrillation" is an error- it should say NO recurrent atrial fibrillation. OK for pt to stop Eliquis and resume ASA 81 mg. Continue higher dose of beta blocker secondary to frequent PVCs and PACs on monitor.    Kerin Ransom PA-C 01/25/2017 10:33 AM

## 2017-01-25 NOTE — Assessment & Plan Note (Signed)
Radio frequency ablation March 2011- One recurrent episode in setting of CAP- 30 day event Oct 2018 showed no PAF (typo in Dr Lurline Del note)

## 2017-01-25 NOTE — Assessment & Plan Note (Signed)
>>  ASSESSMENT AND PLAN FOR PAF (PAROXYSMAL ATRIAL FIBRILLATION) ([facility]) WRITTEN ON 01/25/2017 10:30 AM BY KILROY, LUKE K, PA-C  Radio frequency ablation March 2011- One recurrent episode in setting of CAP- 30 day event Oct 2018 showed no PAF (typo in Dr Fanny note)

## 2017-02-09 ENCOUNTER — Telehealth: Payer: Self-pay | Admitting: Cardiovascular Disease

## 2017-02-09 MED ORDER — METOPROLOL TARTRATE 25 MG PO TABS
25.0000 mg | ORAL_TABLET | Freq: Two times a day (BID) | ORAL | 11 refills | Status: DC
Start: 2017-02-09 — End: 2017-09-13

## 2017-02-09 MED ORDER — FUROSEMIDE 20 MG PO TABS: 20.0000 mg | ORAL_TABLET | Freq: Every day | ORAL | 11 refills | Status: DC | PRN

## 2017-02-09 NOTE — Telephone Encounter (Signed)
New message     *STAT* If patient is at the pharmacy, call can be transferred to refill team.   1. Which medications need to be refilled? (pleafurosemide (LASIX) 20 MG tabletse list name of each medication and dose if known) metoprolol tartrate (LOPRESSOR) 25 MG tablet, ADVAIR DISKUS 250-50 MCG/DOSE AEPB, and  albuterol (PROVENTIL HFA;VENTOLIN HFA) 108 (90 Base) MCG/ACT inhaler 2. Which pharmacy/location (including street and city if local pharmacy) is medication to be sent to? [facility]- [phone]  3. Do they need a 30 day or 90 day supply? [place]

## 2017-02-09 NOTE — Telephone Encounter (Signed)
Refills sent. Returned call to Pt/wife notified to call pcp for ADVAIR DISKUS 250-50 MCG/DOSE AEPB and albuterol (PROVENTIL HFA;VENTOLIN HFA) 108 (90 Base) MCG/ACT inhaler must get from PCP

## 2017-02-16 ENCOUNTER — Other Ambulatory Visit: Payer: Self-pay | Admitting: Cardiovascular Disease

## 2017-02-16 NOTE — Telephone Encounter (Signed)
Left detailed message @ [phone] (M) for pt to call PCP or pulmonary

## 2017-02-16 NOTE — Telephone Encounter (Signed)
No. Defer to PCP or pulmonary specialist.

## 2017-02-25 DIAGNOSIS — Z9622 Myringotomy tube(s) status: Secondary | ICD-10-CM | POA: Insufficient documentation

## 2017-03-01 ENCOUNTER — Other Ambulatory Visit: Payer: Self-pay | Admitting: Cardiovascular Disease

## 2017-08-09 ENCOUNTER — Emergency Department
Admission: EM | Admit: 2017-08-09 | Discharge: 2017-08-09 | Disposition: A | Discharge status: Home / Self Care | Payer: Medicare HMO | Source: Home / Self Care | Attending: Family Medicine | Admitting: Family Medicine

## 2017-08-09 ENCOUNTER — Other Ambulatory Visit: Payer: Self-pay

## 2017-08-09 ENCOUNTER — Encounter: Payer: Self-pay | Admitting: *Deleted

## 2017-08-09 ENCOUNTER — Emergency Department (INDEPENDENT_AMBULATORY_CARE_PROVIDER_SITE_OTHER): Payer: Medicare HMO

## 2017-08-09 DIAGNOSIS — M62838 Other muscle spasm: Secondary | ICD-10-CM | POA: Diagnosis not present

## 2017-08-09 DIAGNOSIS — M5033 Other cervical disc degeneration, cervicothoracic region: Secondary | ICD-10-CM | POA: Diagnosis not present

## 2017-08-09 MED ORDER — PREDNISONE 20 MG PO TABS: ORAL_TABLET | ORAL | 0 refills | Status: DC

## 2017-08-09 MED ORDER — CYCLOBENZAPRINE HCL 5 MG PO TABS: ORAL_TABLET | ORAL | 0 refills | Status: DC

## 2017-08-09 NOTE — ED Provider Notes (Signed)
[name]    CSN: [id number] Arrival date & time: 08/09/17  1248     History   Chief Complaint Chief Complaint  Patient presents with  . Neck Pain    HPI Samuel Mcconnell is a 84 y.o. male.   Patient used his push mower to cut grass 3 days ago, and awoke the next morning with mild pain in his right neck.  The pain has increased and he now feels the pain radiating to his right shoulder, and to a milder extent his left trapezius area.  No radiation to the arms.  He has increased pain with neck extension, neck flexion to the right, and neck rotation to the right.  The history is provided by the patient and the spouse.  Neck Pain  Pain location:  R side Quality:  Aching Pain radiates to:  R shoulder Pain severity:  Mild Pain is:  Worse during the night Onset quality:  Gradual Duration:  2 days Timing:  Constant Progression:  Unchanged Chronicity:  New Context: not recent injury   Relieved by:  Nothing Worsened by:  Position Ineffective treatments:  Ice and heat Associated symptoms: no headaches, no numbness, no paresis, no tingling and no weakness     Past Medical History:  Diagnosis Date  . Atrial fibrillation ([place])    radiofrequency ablation  . Emphysema   . Hypertension   . Thyroid disease     Patient Active Problem List   Diagnosis Date Noted  . PAF (paroxysmal atrial fibrillation) ([place]) 03/15/2013    Past Surgical History:  Procedure Laterality Date  . CARDIAC SURGERY    . COLON SURGERY  1999  . KNEE ARTHROSCOPY Left 07/2000  . LOOP RECORDER IMPLANT  01/16/2010   Medtronic Reveal XT (Dr. Norlene Duel)   . RADIOFREQUENCY ABLATION  06/13/2009  . SHOULDER ARTHROSCOPY WITH ROTATOR CUFF REPAIR Left 04/1996  . TIBIAL TUBERCLERPLASTY  ~ 10/2010  . TRANSTHORACIC ECHOCARDIOGRAM  11/19/2011   EF=>55%; mild MR, mild mitral annular calcif; mild TR, normal RSVP; AV mildly sclerotic, mild AV regurg; trace pulm valve regurg        Home Medications     Prior to Admission medications   Medication Sig Start Date End Date Taking? Authorizing Provider  levothyroxine (SYNTHROID, LEVOTHROID) 100 MCG tablet Take 100 mcg by mouth daily. 11/02/14  Yes [provider]  metoprolol tartrate (LOPRESSOR) 25 MG tablet Take 1 tablet (25 mg total) by mouth 2 (two) times daily. 02/09/17  Yes Croitoru, Mihai, MD  ADVAIR DISKUS 250-50 MCG/DOSE AEPB INHALE 1 PUFF INTO THE LUNGS 2 (TWO) TIMES DAILY. PLEASE SCHEDULE APPOINTMENT FOR REFILLS 01/19/17   Croitoru, Mihai, MD  albuterol (PROVENTIL HFA;VENTOLIN HFA) 108 (90 Base) MCG/ACT inhaler Inhale 2 puffs into the lungs every 6 (six) hours as needed for wheezing or shortness of breath. 02/20/16   Croitoru, Mihai, MD  aspirin 81 MG chewable tablet Chew 2 tablets (162 mg total) by mouth daily. 03/15/13   Croitoru, Mihai, MD  Bioflavonoid Products (BIOFLEX PO) Take 1 tablet daily by mouth.    [provider]  cetirizine (ZYRTEC) 10 MG tablet Take 10 mg by mouth at bedtime.    [provider]  cyclobenzaprine (FLEXERIL) 5 MG tablet Take one tab PO HS for muscle spasm 08/09/17   Kandra Nicolas, MD  dicyclomine (BENTYL) 20 MG tablet Take 20 mg by mouth daily. 12/14/14   [provider]  fluticasone (VERAMYST) 27.5 MCG/SPRAY nasal spray Place 2 sprays into the  nose daily.    [provider]  furosemide (LASIX) 20 MG tablet Take 1 tablet (20 mg total) by mouth daily as needed. NEED OV. 02/09/17   Croitoru, Mihai, MD  Multiple Vitamin (MULTIVITAMIN) capsule Take 1 capsule by mouth daily.    [provider]  omeprazole (PRILOSEC) 10 MG capsule Take 10 mg by mouth daily.    [provider]  predniSONE (DELTASONE) 20 MG tablet Take one tab by mouth twice daily for 4 days, then one daily. Take with food. 08/09/17   Kandra Nicolas, MD    Family History Family History  Problem Relation Age of Onset  . Heart failure Father        MI - died @ 86  . Heart failure  Mother   . Diabetes Son   . Pancreatitis Son        also ETOH abuse   . Cancer Maternal Grandmother   . Heart disease Maternal Grandfather   . Atrial fibrillation Brother        dx'ed age 34  . Atrial fibrillation Brother        dx'ed age 35  . Heart failure Brother   . Atrial fibrillation Sister   . Hypertension Sister   . Heart Problems Sister   . Diabetes Sister   . Heart failure Sister     Social History Social History   Tobacco Use  . Smoking status: Former Smoker    Years: 46.00    Last attempt to quit: 03/14/2002    Years since quitting: 15.4  . Smokeless tobacco: Never Used  . Tobacco comment: 40 pack year history   Substance Use Topics  . Alcohol use: No  . Drug use: No     Allergies   Clindamycin/lincomycin; Dabigatran etexilate mesylate; and Doxycycline   Review of Systems Review of Systems  Musculoskeletal: Positive for neck pain.  Neurological: Negative for tingling, weakness, numbness and headaches.  All other systems reviewed and are negative.    Physical Exam Triage Vital Signs ED Triage Vitals  Enc Vitals Group     BP 08/09/17 1320 139/71     Pulse Rate 08/09/17 1320 68     Resp 08/09/17 1320 18     Temp 08/09/17 1320 (!) 97.5 F (36.4 C)     Temp Source 08/09/17 1320 Oral     SpO2 08/09/17 1320 95 %     Weight 08/09/17 1321 192 lb (87.1 kg)     Height 08/09/17 1321 6' (1.829 m)     Head Circumference --      Peak Flow --      Pain Score 08/09/17 1320 10     Pain Loc --      Pain Edu? --      Excl. in [place]? --    No data found.  Updated Vital Signs BP 139/71 (BP Location: Right Arm)   Pulse 68   Temp (!) 97.5 F (36.4 C) (Oral)   Resp 18   Ht 6' (1.829 m)   Wt 192 lb (87.1 kg)   SpO2 95%   BMI 26.04 kg/m   Visual Acuity Right Eye Distance:   Left Eye Distance:   Bilateral Distance:    Right Eye Near:   Left Eye Near:    Bilateral Near:     Physical Exam  Constitutional: He appears well-developed and  well-nourished. No distress.  HENT:  Head: Atraumatic.  Mouth/Throat: Oropharynx is clear and moist.  Eyes: Pupils are  equal, round, and reactive to light. Conjunctivae are normal.  Neck:    Tenderness neck and trapezius muscles as noted on diagram.  Distal neurovascular function is intact.   Cardiovascular: Normal heart sounds.  Pulmonary/Chest: Breath sounds normal.  Neurological: He is alert.  Skin: Skin is warm and dry.  Nursing note and vitals reviewed.    UC Treatments / Results  Labs (all labs ordered are listed, but only abnormal results are displayed) Labs Reviewed - No data to display  EKG None  Radiology Dg Cervical Spine Complete  Result Date: 08/09/2017 CLINICAL DATA:  Posterior right neck pain and limited range of motion for 3 days. EXAM: CERVICAL SPINE - COMPLETE 4+ VIEW COMPARISON:  MRI cervical spine 03/15/2012. FINDINGS: There is mild reversal of the normal cervical lordosis. No fracture is identified. Severe loss of disc space height is present at all levels. Scattered facet arthropathy appears worst at C7-T1. Prevertebral soft tissues are unremarkable. Lung apices are clear IMPRESSION: No acute abnormality. Advanced multilevel degenerative disease. Electronically Signed   By: Inge Rise M.D.   On: 08/09/2017 14:02    Procedures Procedures (including critical care time)  Medications Ordered in UC Medications - No data to display  Initial Impression / Assessment and Plan / UC Course  I have reviewed the triage vital signs and the nursing notes.  Pertinent labs & imaging results that were available during my care of the patient were reviewed by me and considered in my medical decision making (see chart for details).    Begin prednisone burst/taper. Rx for Flexeril 5mg  HS Followup with Dr. Aundria Mems or Dr. Lynne Leader ([facility]) if not improving about two weeks.    Final Clinical Impressions(s) / UC Diagnoses   Final  diagnoses:  Muscle spasms of neck     Discharge Instructions     Apply ice pack for 20 to 30 minutes, 3 to 4 times daily  Continue until pain and swelling decrease.  Begin range of motion and stretching exercises as tolerated.    ED Prescriptions    Medication Sig Dispense Auth. Provider   cyclobenzaprine (FLEXERIL) 5 MG tablet Take one tab PO HS for muscle spasm 10 tablet Kandra Nicolas, MD   predniSONE (DELTASONE) 20 MG tablet Take one tab by mouth twice daily for 4 days, then one daily. Take with food. 12 tablet Kandra Nicolas, MD         Kandra Nicolas, MD 08/09/17 1452

## 2017-08-09 NOTE — Discharge Instructions (Addendum)
Apply ice pack for 20 to 30 minutes, 3 to 4 times daily  Continue until pain and swelling decrease.  Begin range of motion and stretching exercises as tolerated. 

## 2017-08-09 NOTE — ED Triage Notes (Signed)
Pt c/o RT side neck pain that radiates to his shoulder and head x 2 days. Denies injury. He took Aleve at 1100.

## 2017-09-13 ENCOUNTER — Encounter: Payer: Self-pay | Admitting: Cardiovascular Disease

## 2017-09-13 ENCOUNTER — Ambulatory Visit: Payer: Medicare HMO | Admitting: Cardiovascular Disease

## 2017-09-13 VITALS — BP 136/78 | HR 56 | Ht 71.5 in | Wt 192.0 lb

## 2017-09-13 DIAGNOSIS — J449 Chronic obstructive pulmonary disease, unspecified: Secondary | ICD-10-CM

## 2017-09-13 DIAGNOSIS — I10 Essential (primary) hypertension: Secondary | ICD-10-CM

## 2017-09-13 DIAGNOSIS — I480 Paroxysmal atrial fibrillation: Secondary | ICD-10-CM

## 2017-09-13 MED ORDER — BISOPROLOL FUMARATE 5 MG PO TABS: 5.0000 mg | ORAL_TABLET | Freq: Every day | ORAL | 3 refills | Status: DC

## 2017-09-13 MED ORDER — ASPIRIN EC 81 MG PO TBEC: 81.0000 mg | DELAYED_RELEASE_TABLET | Freq: Every day | ORAL | Status: DC

## 2017-09-13 MED ORDER — FLUTICASONE-UMECLIDIN-VILANT 100-62.5-25 MCG/INH IN AEPB: 1.0000 | INHALATION_SPRAY | Freq: Every day | RESPIRATORY_TRACT | 3 refills | Status: DC

## 2017-09-13 NOTE — Progress Notes (Signed)
Cardiology Office Note    Date:  09/14/2017   ID:  Samuel Mcconnell, DOB Aug 11, 1927, MRN [id number]  PCP:  Jamesetta Geralds, MD  Cardiologist:   Sanda Klein, MD   Chief Complaint  Patient presents with  . Follow-up    History of Present Illness:  Samuel Mcconnell is a 84 y.o. male with a single documented episode of paroxysmal atrial fibrillation that occurred approximately 4 years ago. Many years before that he had radiofrequency ablation and this is the only event that has occurred since that time.  He has not had clinical arrhythmic events and very rare if ever has palpitations.  He goes to the gym 3 days a week on the stationary bike and lifts some weights.  He has cut back a little bit on his weights due to problems with his cervical spine.  He was recently placed on some prednisone for joint inflammation and felt poorly a month.  He has problems with his Advair Diskus.  He does not think he is getting the medicine from the device.  He like to try different bronchodilator.  Continues to have problems with random exacerbations of his breathing usually associated with exposure to some outside allergen.  He has mild and chronic right ankle swelling, following a local infection that occurred decades ago.  The patient specifically denies any chest pain at rest exertion, orthopnea, paroxysmal nocturnal dyspnea, syncope, palpitations, focal neurological deficits, intermittent claudication, lower extremity edema, unexplained weight gain, cough, hemoptysis.  His last echocardiogram in 2018 showed normal left ventricular systolic function and no clear evidence of diastolic dysfunction (his tissue Doppler velocities were actually probably normal or almost 82 years old).  The left atrium was "moderately dilated" (systolic diameter was only 36 mm, but indexed volume was 28.5 which I believe represents very mild dilation).  There is no serious valvular problem.  He underwent a event monitor as if  there were 2018 that showed PACs and PVCs but no atrial fibrillation.  He has hypertension, COPD, treated hypothyroidism and acid reflux. He has a long-standing right bundle branch block.  Past Medical History:  Diagnosis Date  . Atrial fibrillation ([place])    radiofrequency ablation  . Emphysema   . Hypertension   . Thyroid disease     Past Surgical History:  Procedure Laterality Date  . CARDIAC SURGERY    . COLON SURGERY  1999  . KNEE ARTHROSCOPY Left 07/2000  . LOOP RECORDER IMPLANT  01/16/2010   Medtronic Reveal XT (Dr. Norlene Duel)   . RADIOFREQUENCY ABLATION  06/13/2009  . SHOULDER ARTHROSCOPY WITH ROTATOR CUFF REPAIR Left 04/1996  . TIBIAL TUBERCLERPLASTY  ~ 10/2010  . TRANSTHORACIC ECHOCARDIOGRAM  11/19/2011   EF=>55%; mild MR, mild mitral annular calcif; mild TR, normal RSVP; AV mildly sclerotic, mild AV regurg; trace pulm valve regurg     Current Medications: Outpatient Medications Prior to Visit  Medication Sig Dispense Refill  . ADVAIR DISKUS 250-50 MCG/DOSE AEPB INHALE 1 PUFF INTO THE LUNGS 2 (TWO) TIMES DAILY. PLEASE SCHEDULE APPOINTMENT FOR REFILLS 60 each 0  . Bioflavonoid Products (BIOFLEX PO) Take 1 tablet daily by mouth.    . cetirizine (ZYRTEC) 10 MG tablet Take 10 mg by mouth at bedtime.    . cyclobenzaprine (FLEXERIL) 5 MG tablet Take one tab PO HS for muscle spasm 10 tablet 0  . dicyclomine (BENTYL) 20 MG tablet Take 20 mg by mouth daily.    . fluticasone (VERAMYST) 27.5 MCG/SPRAY nasal spray Place 2 sprays into  the nose daily.    . furosemide (LASIX) 20 MG tablet Take 1 tablet (20 mg total) by mouth daily as needed. NEED OV. 30 tablet 11  . levothyroxine (SYNTHROID, LEVOTHROID) 100 MCG tablet Take 100 mcg by mouth daily.    . Multiple Vitamin (MULTIVITAMIN) capsule Take 1 capsule by mouth daily.    Marland Kitchen omeprazole (PRILOSEC) 10 MG capsule Take 10 mg by mouth as needed.     . predniSONE (DELTASONE) 20 MG tablet Take one tab by mouth twice daily for 4 days, then one  daily. Take with food. 12 tablet 0  . albuterol (PROVENTIL HFA;VENTOLIN HFA) 108 (90 Base) MCG/ACT inhaler Inhale 2 puffs into the lungs every 6 (six) hours as needed for wheezing or shortness of breath. 1 Inhaler 3  . aspirin 81 MG chewable tablet Chew 2 tablets (162 mg total) by mouth daily.    . metoprolol tartrate (LOPRESSOR) 25 MG tablet Take 1 tablet (25 mg total) by mouth 2 (two) times daily. 60 tablet 11   No facility-administered medications prior to visit.      Allergies:   Clindamycin/lincomycin; Dabigatran etexilate mesylate; and Doxycycline   Social History   Socioeconomic History  . Marital status: Married    Spouse name: Not on file  . Number of children: 2  . Years of education: Not on file  . Highest education level: Not on file  Occupational History  . Not on file  Social Needs  . Financial resource strain: Not on file  . Food insecurity:    Worry: Not on file    Inability: Not on file  . Transportation needs:    Medical: Not on file    Non-medical: Not on file  Tobacco Use  . Smoking status: Former Smoker    Years: 46.00    Last attempt to quit: 03/14/2002    Years since quitting: 15.5  . Smokeless tobacco: Never Used  . Tobacco comment: 40 pack year history   Substance and Sexual Activity  . Alcohol use: No  . Drug use: No  . Sexual activity: Not on file  Lifestyle  . Physical activity:    Days per week: Not on file    Minutes per session: Not on file  . Stress: Not on file  Relationships  . Social connections:    Talks on phone: Not on file    Gets together: Not on file    Attends religious service: Not on file    Active member of club or organization: Not on file    Attends meetings of clubs or organizations: Not on file    Relationship status: Not on file  Other Topics Concern  . Not on file  Social History Narrative  . Not on file     Family History:  The patient's family history includes Atrial fibrillation in his brother, brother,  and sister; Cancer in his maternal grandmother; Diabetes in his sister and son; Heart Problems in his sister; Heart disease in his maternal grandfather; Heart failure in his brother, father, mother, and sister; Hypertension in his sister; Pancreatitis in his son.   ROS:   Please see the history of present illness.    ROS All other systems reviewed and are negative.   PHYSICAL EXAM:   VS:  BP 136/78 (BP Location: Left Arm, Patient Position: Sitting, Cuff Size: Normal)   Pulse (!) 56   Ht 5' 11.5" (1.816 m)   Wt 192 lb (87.1 kg)   BMI 26.41 kg/m  GEN: Well nourished, well developed, in no acute distress  HEENT: normal  Neck: no JVD, carotid bruits, or masses Cardiac: Widely split S2, RRR; no murmurs, rubs, or gallops,no edema  Respiratory: Distant breath sounds, but without wheezing and clear to auscultation bilaterally, normal work of breathing GI: soft, nontender, nondistended, + BS MS: no deformity or atrophy  Skin: warm and dry, no rash Neuro:  Alert and Oriented x 3, Strength and sensation are intact Psych: euthymic mood, full affect  Wt Readings from Last 3 Encounters:  09/13/17 192 lb (87.1 kg)  08/09/17 192 lb (87.1 kg)  01/25/17 192 lb 6.4 oz (87.3 kg)      Studies/Labs Reviewed:   EKG:  EKG is ordered today.  The ekg ordered today demonstrates Normal sinus rhythm, right bundle branch block, QTC 468  Recent Labs: Nov 2017 normal routine chemistries and liver function tests, creatinine 1.0, glucose 96, TSH 2.34, hemoglobin 14.2  Lipid Panel April 2017 cholesterol 153, triglycerides 144, HDL 35, LDL 89  Additional studies/ records that were reviewed today include:  Notes from Dr. Salvadore Dom and Marcelene Butte.  ASSESSMENT:    1. PAF (paroxysmal atrial fibrillation) ([place])   2. Chronic obstructive pulmonary disease, unspecified COPD type ([place])   3. Essential hypertension      PLAN:  In order of problems listed above:  1. AFib: Following his radiofrequency  ablation the episodes have been extremely infrequent. Only one episode occurred during an acute respiratory illness. No subsequent events in 3 years. His embolic risk is relatively low, increased exclusively due to his age (CHADSVasc 2).  For that reason, he is taking only aspirin and not a full anticoagulant.  He does not have any history of embolic events.  Beta-blocker seems to have worked very well to suppress any residual arrhythmia. 2. COPD: We will switch him to a more selective beta-blocker even have the option to switch to a calcium channel blocker.  He does not think the Advair is working and seems to be having some difficulty with the delivery mechanism.  We will switch him to Trelegy.  Reminded him that this is not a rescue inhaler should never be taken more than once daily.  He can use the albuterol if necessary.  He is interested in establishing some follow-up through the [place] system and I recommended that he can see Dr. Danton Sewer at the [facility] facility.. 3. HTN: Fair if not perfect control.  Reevaluate after switching to a different beta-blocker.   Medication Adjustments/Labs and Tests Ordered: Current medicines are reviewed at length with the patient today.  Concerns regarding medicines are outlined above.  Medication changes, Labs and Tests ordered today are listed in the Patient Instructions below. Patient Instructions  Dr Sallyanne Kuster has recommended making the following medication changes: 1. STOP Metoprolol 2. START Bisoprolol 5 mg - take 1 tablet daily 3. DECREASE Aspirin to 81 mg daily 4. STOP Advair 5. START Trelegy Ellipta - inhale 1 puff daily  Your physician recommends that you schedule a follow-up appointment in 12 months. You will receive a reminder letter in the mail two months in advance. If you don't receive a letter, please call our office to schedule the follow-up appointment.  If you need a refill on your cardiac medications before your next appointment, please  call your pharmacy.    Signed, Sanda Klein, MD  09/14/2017 2:29 PM    [address], [place], [place]  [address] Phone: [phone]; Fax: [phone]   

## 2017-09-13 NOTE — Patient Instructions (Addendum)
Dr Sallyanne Kuster has recommended making the following medication changes: 1. STOP Metoprolol 2. START Bisoprolol 5 mg - take 1 tablet daily 3. DECREASE Aspirin to 81 mg daily 4. STOP Advair 5. START Trelegy Ellipta - inhale 1 puff daily  Your physician recommends that you schedule a follow-up appointment in 12 months. You will receive a reminder letter in the mail two months in advance. If you don't receive a letter, please call our office to schedule the follow-up appointment.  If you need a refill on your cardiac medications before your next appointment, please call your pharmacy.

## 2017-09-14 DIAGNOSIS — J449 Chronic obstructive pulmonary disease, unspecified: Secondary | ICD-10-CM | POA: Insufficient documentation

## 2017-09-14 DIAGNOSIS — I10 Essential (primary) hypertension: Secondary | ICD-10-CM | POA: Insufficient documentation

## 2017-11-02 ENCOUNTER — Telehealth: Payer: Self-pay | Admitting: Cardiovascular Disease

## 2017-11-02 MED ORDER — FLUTICASONE-SALMETEROL 250-50 MCG/DOSE IN AEPB: 1.0000 | INHALATION_SPRAY | Freq: Two times a day (BID) | RESPIRATORY_TRACT | 6 refills | Status: DC

## 2017-11-02 NOTE — Telephone Encounter (Signed)
I did not need that prvider

## 2017-11-02 NOTE — Telephone Encounter (Signed)
OK to switch back MCr

## 2017-11-02 NOTE — Telephone Encounter (Signed)
Spoke to patient. Patient states he has been using the trelegy  ,but patient does not think it is working as well as the advair. Patient request a change back to Advair from  Dr Sallyanne Kuster. Patient states his "primary does know about COPD" patienT aware will defer to Dr Sallyanne Kuster

## 2017-11-02 NOTE — Telephone Encounter (Signed)
PATIENT AWARE  AND PRESCRIPTION E-SENT TO PHARMACY.

## 2017-11-02 NOTE — Telephone Encounter (Signed)
New Message:   Please call Patient back he has question about some medication. Thank you.

## 2018-03-25 ENCOUNTER — Emergency Department
Admission: EM | Admit: 2018-03-25 | Discharge: 2018-03-25 | Disposition: A | Discharge status: Home / Self Care | Payer: Medicare HMO | Source: Home / Self Care | Attending: Family Medicine | Admitting: Family Medicine

## 2018-03-25 ENCOUNTER — Encounter: Payer: Self-pay | Admitting: Emergency Medicine

## 2018-03-25 ENCOUNTER — Other Ambulatory Visit: Payer: Self-pay

## 2018-03-25 DIAGNOSIS — J441 Chronic obstructive pulmonary disease with (acute) exacerbation: Secondary | ICD-10-CM

## 2018-03-25 DIAGNOSIS — J069 Acute upper respiratory infection, unspecified: Secondary | ICD-10-CM

## 2018-03-25 DIAGNOSIS — B9789 Other viral agents as the cause of diseases classified elsewhere: Secondary | ICD-10-CM

## 2018-03-25 MED ORDER — METHYLPREDNISOLONE ACETATE 80 MG/ML IJ SUSP
80.0000 mg | Freq: Once | INTRAMUSCULAR | Status: AC
  Administered 2018-03-25: 80 mg via INTRAMUSCULAR

## 2018-03-25 MED ORDER — DOXYCYCLINE HYCLATE 100 MG PO CAPS: 100.0000 mg | ORAL_CAPSULE | Freq: Two times a day (BID) | ORAL | 0 refills | Status: DC

## 2018-03-25 MED ORDER — BENZONATATE 200 MG PO CAPS: ORAL_CAPSULE | ORAL | 0 refills | Status: DC

## 2018-03-25 NOTE — Discharge Instructions (Addendum)
Take plain guaifenesin (1200mg  extended release tabs such as Mucinex) twice daily, with plenty of water, for cough and congestion.  Get adequate rest.   May use Afrin nasal spray (or generic oxymetazoline) each morning for about 5 days and then discontinue.  Also recommend using saline nasal spray several times daily and saline nasal irrigation (AYR is a common brand).    Try warm salt water gargles for sore throat.  May continue Vick's each evening.  Continue albuterol and Advair inhalers. Stop all antihistamines (cetirizine, etc) for now, and other non-prescription cough/cold preparations. May take Tylenol as needed for fever and sweats. May take Delsym Cough Suppressant with Tessalon at bedtime for nighttime cough.

## 2018-03-25 NOTE — ED Triage Notes (Signed)
Productive cough, congestion with yellow mucus x 3 days

## 2018-03-25 NOTE — ED Provider Notes (Signed)
[name]    CSN: [id number] Arrival date & time: 03/25/18  1214     History   Chief Complaint Chief Complaint  Patient presents with  . Cough    HPI Samuel Mcconnell is a 85 y.o. male.   One week ago patient developed typical cold-like symptoms developing over several days, including mild sore throat, sinus congestion, fatigue, and cough.  He reports persistent night sweats, and has developed mild wheezing and shortness of breath at times but no pleuritic pain.  He has COPD, and a ventilation tube in his right tympanic membrane.  He has noted increasing drainage from his right ear for several days without earache. He has had several episodes of pneumonia in the past.  He presently uses Advair and albuterol inhalers.  The history is provided by the patient.    Past Medical History:  Diagnosis Date  . Atrial fibrillation ([place])    radiofrequency ablation  . Emphysema   . Hypertension   . Thyroid disease     Patient Active Problem List   Diagnosis Date Noted  . Chronic obstructive pulmonary disease ([place]) 09/14/2017  . Essential hypertension 09/14/2017  . PAF (paroxysmal atrial fibrillation) ([place]) 03/15/2013    Past Surgical History:  Procedure Laterality Date  . CARDIAC SURGERY    . COLON SURGERY  1999  . KNEE ARTHROSCOPY Left 07/2000  . LOOP RECORDER IMPLANT  01/16/2010   Medtronic Reveal XT (Dr. Norlene Duel)   . RADIOFREQUENCY ABLATION  06/13/2009  . SHOULDER ARTHROSCOPY WITH ROTATOR CUFF REPAIR Left 04/1996  . TIBIAL TUBERCLERPLASTY  ~ 10/2010  . TRANSTHORACIC ECHOCARDIOGRAM  11/19/2011   EF=>55%; mild MR, mild mitral annular calcif; mild TR, normal RSVP; AV mildly sclerotic, mild AV regurg; trace pulm valve regurg        Home Medications    Prior to Admission medications   Medication Sig Start Date End Date Taking? Authorizing Provider  aspirin EC 81 MG tablet Take 1 tablet (81 mg total) by mouth daily. 09/13/17   Croitoru, Mihai, MD  benzonatate  (TESSALON) 200 MG capsule Take one cap by mouth at bedtime as needed for cough.  May repeat in 4 to 6 hours 03/25/18   Kandra Nicolas, MD  Bioflavonoid Products (BIOFLEX PO) Take 1 tablet daily by mouth.    [provider]  bisoprolol (ZEBETA) 5 MG tablet Take 1 tablet (5 mg total) by mouth daily. 09/13/17   Croitoru, Mihai, MD  cetirizine (ZYRTEC) 10 MG tablet Take 10 mg by mouth at bedtime.    [provider]  doxycycline (VIBRAMYCIN) 100 MG capsule Take 1 capsule (100 mg total) by mouth 2 (two) times daily. Take with food. 03/25/18   Kandra Nicolas, MD  fluticasone (VERAMYST) 27.5 MCG/SPRAY nasal spray Place 2 sprays into the nose daily.    [provider]  Fluticasone-Salmeterol (ADVAIR DISKUS) 250-50 MCG/DOSE AEPB Inhale 1 puff into the lungs 2 (two) times daily. Please schedule appointment for refills 11/02/17   Croitoru, Mihai, MD  Multiple Vitamin (MULTIVITAMIN) capsule Take 1 capsule by mouth daily.    [provider]    Family History Family History  Problem Relation Age of Onset  . Heart failure Father        MI - died @ 54  . Heart failure Mother   . Diabetes Son   . Pancreatitis Son        also ETOH abuse   . Cancer Maternal Grandmother   . Heart disease  Maternal Grandfather   . Atrial fibrillation Brother        dx'ed age 61  . Atrial fibrillation Brother        dx'ed age 5  . Heart failure Brother   . Atrial fibrillation Sister   . Hypertension Sister   . Heart Problems Sister   . Diabetes Sister   . Heart failure Sister     Social History Social History   Tobacco Use  . Smoking status: Former Smoker    Years: 46.00    Last attempt to quit: 03/14/2002    Years since quitting: 16.0  . Smokeless tobacco: Never Used  . Tobacco comment: 40 pack year history   Substance Use Topics  . Alcohol use: No  . Drug use: No     Allergies   Clindamycin/lincomycin; Dabigatran etexilate mesylate; and Doxycycline   Review of  Systems Review of Systems + sore throat, resolved + cough No pleuritic pain + wheezing + nasal congestion + post-nasal drainage No sinus pain/pressure No itchy/red eyes No earache No hemoptysis + SOB No fever, + chills/sweats No nausea No vomiting No abdominal pain No diarrhea No urinary symptoms No skin rash + fatigue No myalgias No headache Used OTC meds without relief   Physical Exam Triage Vital Signs ED Triage Vitals  Enc Vitals Group     BP 03/25/18 1243 129/68     Pulse Rate 03/25/18 1243 67     Resp --      Temp 03/25/18 1243 98 F (36.7 C)     Temp Source 03/25/18 1243 Oral     SpO2 03/25/18 1243 95 %     Weight 03/25/18 1244 194 lb (88 kg)     Height 03/25/18 1244 5\' 11"  (1.803 m)     Head Circumference --      Peak Flow --      Pain Score 03/25/18 1244 0     Pain Loc --      Pain Edu? --      Excl. in [place]? --    No data found.  Updated Vital Signs BP 129/68 (BP Location: Right Arm)   Pulse 67   Temp 98 F (36.7 C) (Oral)   Ht 5\' 11"  (1.803 m)   Wt 88 kg   SpO2 95%   BMI 27.06 kg/m   Visual Acuity Right Eye Distance:   Left Eye Distance:   Bilateral Distance:    Right Eye Near:   Left Eye Near:    Bilateral Near:     Physical Exam Nursing notes and Vital Signs reviewed. Appearance:  Patient appears stated age, and in no acute distress Eyes:  Pupils are equal, round, and reactive to light and accomodation.  Extraocular movement is intact.  Conjunctivae are not inflamed  Ears:  Canals normal.  Left tympanic membrane scarred.  Right tympanic membrane is slightly erythematous and has ventilation tube in place that appears patent. Nose:  Mildly congested turbinates.  No sinus tenderness.   Pharynx:  Normal Neck:  Supple.  Enlarged nontender posterior/lateral nodes are palpated bilaterally  Lungs:  Clear to auscultation.  Breath sounds are equal.  Moving air well. Heart:  Regular rate and rhythm without murmurs, rubs, or gallops.   Abdomen:  Nontender without masses or hepatosplenomegaly.  Bowel sounds are present.  No CVA or flank tenderness.  Extremities:  No edema.  Skin:  No rash present.    UC Treatments / Results  Labs (all labs ordered are listed,  but only abnormal results are displayed) Labs Reviewed - No data to display  EKG None  Radiology No results found.  Procedures Procedures (including critical care time)  Medications Ordered in UC Medications  methylPREDNISolone acetate (DEPO-MEDROL) injection 80 mg (has no administration in time range)    Initial Impression / Assessment and Plan / UC Course  I have reviewed the triage vital signs and the nursing notes.  Pertinent labs & imaging results that were available during my care of the patient were reviewed by me and considered in my medical decision making (see chart for details).    Suspect developing right otitis media and/or maxillary sinusitis. Administered Depo Medrol 80mg  IM.  Begin empiric doxycycline. Prescription written for Benzonatate [facility]) to take at bedtime for night-time cough.  Followup with Family Doctor if not improved in about 10 days.   Final Clinical Impressions(s) / UC Diagnoses   Final diagnoses:  COPD exacerbation ([place])  Viral URI with cough     Discharge Instructions     Take plain guaifenesin (1200mg  extended release tabs such as Mucinex) twice daily, with plenty of water, for cough and congestion.  Get adequate rest.   May use Afrin nasal spray (or generic oxymetazoline) each morning for about 5 days and then discontinue.  Also recommend using saline nasal spray several times daily and saline nasal irrigation (AYR is a common brand).    Try warm salt water gargles for sore throat.  May continue Vick's each evening.  Continue albuterol and Advair inhalers. Stop all antihistamines (cetirizine, etc) for now, and other non-prescription cough/cold preparations. May take Tylenol as needed for fever and  sweats. May take Delsym Cough Suppressant with Tessalon at bedtime for nighttime cough.     ED Prescriptions    Medication Sig Dispense Auth. Provider   doxycycline (VIBRAMYCIN) 100 MG capsule Take 1 capsule (100 mg total) by mouth 2 (two) times daily. Take with food. 20 capsule Kandra Nicolas, MD   benzonatate (TESSALON) 200 MG capsule Take one cap by mouth at bedtime as needed for cough.  May repeat in 4 to 6 hours 15 capsule Assunta Found Ishmael Holter, MD         Kandra Nicolas, MD 03/25/18 1356

## 2018-04-20 ENCOUNTER — Telehealth: Payer: Self-pay | Admitting: Cardiovascular Disease

## 2018-04-20 NOTE — Telephone Encounter (Signed)
Disregard- open in error

## 2018-05-13 ENCOUNTER — Encounter: Payer: Self-pay | Admitting: Cardiovascular Disease

## 2018-05-25 ENCOUNTER — Other Ambulatory Visit: Payer: Self-pay | Admitting: Cardiovascular Disease

## 2018-07-04 ENCOUNTER — Other Ambulatory Visit: Payer: Self-pay | Admitting: Cardiovascular Disease

## 2018-08-26 ENCOUNTER — Other Ambulatory Visit: Payer: Self-pay | Admitting: Cardiovascular Disease

## 2018-08-27 ENCOUNTER — Other Ambulatory Visit: Payer: Self-pay

## 2018-08-27 ENCOUNTER — Emergency Department (INDEPENDENT_AMBULATORY_CARE_PROVIDER_SITE_OTHER)
Admission: EM | Admit: 2018-08-27 | Discharge: 2018-08-27 | Disposition: A | Discharge status: Home / Self Care | Payer: Medicare HMO | Source: Home / Self Care | Attending: Family Medicine | Admitting: Family Medicine

## 2018-08-27 DIAGNOSIS — R6883 Chills (without fever): Secondary | ICD-10-CM | POA: Diagnosis not present

## 2018-08-27 NOTE — Discharge Instructions (Addendum)
Rest, increase fluid intake. Monitor temperature regularly if chills recur.  If symptoms become significantly worse during the night or over the weekend, proceed to the local emergency room.

## 2018-08-27 NOTE — ED Triage Notes (Signed)
Pt c/o fever and cold sweats that started last night. Some SOB.  Says he has hx of afib but had an ablation. Fever sometimes makes it come back. Denies cough. Took two rounds of tylenol last night.

## 2018-08-27 NOTE — ED Provider Notes (Signed)
[facility]    CSN: [id number] Arrival date & time: 08/27/18  6270     History   Chief Complaint Chief Complaint  Patient presents with  . Fever    HPI Samuel Mcconnell is a 85 y.o. male.   Patient reports that he suddenly developed cold sweats at bedtime last night, and believes that he may have had a fever.  He reports that he had mild tightness and wheezing in his chest which is normal with his history of COPD.  The symptoms were completely resolved this morning.  He feels completely well.  The history is provided by the patient.    Past Medical History:  Diagnosis Date  . Atrial fibrillation ([place])    radiofrequency ablation  . Emphysema   . Hypertension   . Thyroid disease     Patient Active Problem List   Diagnosis Date Noted  . Chronic obstructive pulmonary disease ([place]) 09/14/2017  . Essential hypertension 09/14/2017  . PAF (paroxysmal atrial fibrillation) ([place]) 03/15/2013    Past Surgical History:  Procedure Laterality Date  . CARDIAC SURGERY    . COLON SURGERY  1999  . KNEE ARTHROSCOPY Left 07/2000  . LOOP RECORDER IMPLANT  01/16/2010   Medtronic Reveal XT (Dr. Norlene Duel)   . RADIOFREQUENCY ABLATION  06/13/2009  . SHOULDER ARTHROSCOPY WITH ROTATOR CUFF REPAIR Left 04/1996  . TIBIAL TUBERCLERPLASTY  ~ 10/2010  . TRANSTHORACIC ECHOCARDIOGRAM  11/19/2011   EF=>55%; mild MR, mild mitral annular calcif; mild TR, normal RSVP; AV mildly sclerotic, mild AV regurg; trace pulm valve regurg        Home Medications    Prior to Admission medications   Medication Sig Start Date End Date Taking? Authorizing Provider  aspirin EC 81 MG tablet Take 1 tablet (81 mg total) by mouth daily. 09/13/17   Croitoru, Mihai, MD  benzonatate (TESSALON) 200 MG capsule Take one cap by mouth at bedtime as needed for cough.  May repeat in 4 to 6 hours 03/25/18   Kandra Nicolas, MD  Bioflavonoid Products (BIOFLEX PO) Take 1 tablet daily by mouth.    [provider]   bisoprolol (ZEBETA) 5 MG tablet Take 1 tablet (5 mg total) by mouth daily. 09/13/17   Croitoru, Mihai, MD  cetirizine (ZYRTEC) 10 MG tablet Take 10 mg by mouth at bedtime.    [provider]  doxycycline (VIBRAMYCIN) 100 MG capsule Take 1 capsule (100 mg total) by mouth 2 (two) times daily. Take with food. 03/25/18   Kandra Nicolas, MD  fluticasone (VERAMYST) 27.5 MCG/SPRAY nasal spray Place 2 sprays into the nose daily.    [provider]  Fluticasone-Salmeterol (ADVAIR) 250-50 MCG/DOSE AEPB Inhale 1 puff into the lungs 2 (two) times daily. Please schedule appointment for refills 07/04/18   Croitoru, Mihai, MD  furosemide (LASIX) 20 MG tablet TAKE ONE TABLET BY MOUTH DAILY AS NEEDED 05/25/18   Croitoru, Mihai, MD  Multiple Vitamin (MULTIVITAMIN) capsule Take 1 capsule by mouth daily.    [provider]    Family History Family History  Problem Relation Age of Onset  . Heart failure Father        MI - died @ 54  . Heart failure Mother   . Diabetes Son   . Pancreatitis Son        also ETOH abuse   . Cancer Maternal Grandmother   . Heart disease Maternal Grandfather   . Atrial fibrillation Brother  dx'ed age 93  . Atrial fibrillation Brother        dx'ed age 60  . Heart failure Brother   . Atrial fibrillation Sister   . Hypertension Sister   . Heart Problems Sister   . Diabetes Sister   . Heart failure Sister     Social History Social History   Tobacco Use  . Smoking status: Former Smoker    Years: 46.00    Last attempt to quit: 03/14/2002    Years since quitting: 16.4  . Smokeless tobacco: Never Used  . Tobacco comment: 40 pack year history   Substance Use Topics  . Alcohol use: No  . Drug use: No     Allergies   Clindamycin/lincomycin; Dabigatran etexilate mesylate; and Doxycycline   Review of Systems Review of Systems No sore throat No cough No pleuritic pain No wheezing No nasal congestion No post-nasal drainage No sinus  pain/pressure No itchy/red eyes No earache No hemoptysis + minimal SOB ? fever, + chills/sweats, resolved No nausea No vomiting No abdominal pain No diarrhea No urinary symptoms No skin rash + fatigue No myalgias No headache    Physical Exam Triage Vital Signs ED Triage Vitals  Enc Vitals Group     BP 08/27/18 1025 117/71     Pulse Rate 08/27/18 1025 63     Resp 08/27/18 1025 18     Temp 08/27/18 1025 97.9 F (36.6 C)     Temp Source 08/27/18 1025 Oral     SpO2 08/27/18 1025 93 %     Weight --      Height --      Head Circumference --      Peak Flow --      Pain Score 08/27/18 1026 0     Pain Loc --      Pain Edu? --      Excl. in [place]? --    No data found.  Updated Vital Signs BP 117/71 (BP Location: Right Arm)   Pulse 63   Temp 97.9 F (36.6 C) (Oral)   Resp 18   SpO2 93%   Visual Acuity Right Eye Distance:   Left Eye Distance:   Bilateral Distance:    Right Eye Near:   Left Eye Near:    Bilateral Near:     Physical Exam Nursing notes and Vital Signs reviewed. Appearance:  Patient appears stated age, and in no acute distress Eyes:  Pupils are equal, round, and reactive to light and accomodation.  Extraocular movement is intact.  Conjunctivae are not inflamed  Ears:  Canals are partly occluded with cerumen bilaterally; unable to completely visualize tympanic membranes. Nose:  Normal turbinates.  No sinus tenderness.   Pharynx:  Normal Neck:  Supple. No adenopathy.   Lungs:  Clear to auscultation.  Breath sounds are equal.  Moving air well. Heart:  Regular rate and rhythm without murmurs, rubs, or gallops.  Abdomen:  Nontender without masses or hepatosplenomegaly.  Bowel sounds are present.  No CVA or flank tenderness.  Extremities:  Bilateral edema.  Skin:  No rash present.    UC Treatments / Results  Labs (all labs ordered are listed, but only abnormal results are displayed) Labs Reviewed - No data to display  EKG None  Radiology No  results found.  Procedures Procedures (including critical care time)  Medications Ordered in UC Medications - No data to display  Initial Impression / Assessment and Plan / UC Course  I have reviewed the  triage vital signs and the nursing notes.  Pertinent labs & imaging results that were available during my care of the patient were reviewed by me and considered in my medical decision making (see chart for details).    Unremarkable exam today.  Recommend that patient monitor himself and follow-up with PCP if fever/chills recur and/or become persistent.   Final Clinical Impressions(s) / UC Diagnoses   Final diagnoses:  Chills     Discharge Instructions     Rest, increase fluid intake. Monitor temperature regularly if chills recur.  If symptoms become significantly worse during the night or over the weekend, proceed to the local emergency room.     ED Prescriptions    None         Kandra Nicolas, MD 08/27/18 1549

## 2018-08-29 ENCOUNTER — Telehealth: Payer: Self-pay

## 2018-08-29 NOTE — Telephone Encounter (Signed)
Spoke with patients wife about her visit on the same day, and asked to speak to [place], he was outside.  She stated that he was feeling well, and no further problems.  Will follow up as needed.

## 2018-09-03 ENCOUNTER — Other Ambulatory Visit: Payer: Self-pay | Admitting: Cardiovascular Disease

## 2018-09-14 ENCOUNTER — Other Ambulatory Visit: Payer: Self-pay | Admitting: Cardiovascular Disease

## 2018-10-27 NOTE — Progress Notes (Signed)
Cardiology Office Note    Date:  10/27/2018   ID:  Samuel Mcconnell, DOB 02-20-1928, MRN [id number]  PCP:  Samuel Geralds, MD  Cardiologist:   Samuel Klein, MD   No chief complaint on file.   History of Present Illness:  Samuel Mcconnell is a 85 y.o. male with a single documented episode of paroxysmal atrial fibrillation that occurred approximately 9 years ago. Many years before that he had radiofrequency ablation and this is the only event that has occurred since that time.  He was seen in the ED in January for COPD exacerbation and at Urgent Care in June for chills.  He is generally doing well and if he remembers to use his Advair twice a day he rarely requires additional rescue inhaler.  He has not been able to go to the gym but continues to exercise on a regular basis.  He quit smoking about 13 years ago.  He has mild and chronic right ankle swelling, following a local infection that occurred decades ago.  The patient specifically denies any chest pain at rest or with exertion, dyspnea with usual exertion, orthopnea, paroxysmal nocturnal dyspnea, syncope, palpitations, focal neurological deficits, intermittent claudication, unexplained weight gain, cough, hemoptysis or wheezing.  Samuel Mcconnell are planning to move into a smaller home in the near future.  His last echocardiogram in 2018 showed normal left ventricular systolic function and no clear evidence of diastolic dysfunction (his tissue Doppler velocities were actually probably normal or almost 85 years old).  The left atrium was "moderately dilated" (systolic diameter was only 36 mm, but indexed volume was 28.5 which I believe represents very mild dilation).  There is no serious valvular problem.  He underwent a event monitor as if there were 2018 that showed PACs and PVCs but no atrial fibrillation.  He has hypertension, COPD, treated hypothyroidism and acid reflux. He has a long-standing right bundle branch block.  Past  Medical History:  Diagnosis Date  . Atrial fibrillation ([place])    radiofrequency ablation  . Emphysema   . Hypertension   . Thyroid disease     Past Surgical History:  Procedure Laterality Date  . CARDIAC SURGERY    . COLON SURGERY  1999  . KNEE ARTHROSCOPY Left 07/2000  . LOOP RECORDER IMPLANT  01/16/2010   Medtronic Reveal XT (Dr. Norlene Mcconnell)   . RADIOFREQUENCY ABLATION  06/13/2009  . SHOULDER ARTHROSCOPY WITH ROTATOR CUFF REPAIR Left 04/1996  . TIBIAL TUBERCLERPLASTY  ~ 10/2010  . TRANSTHORACIC ECHOCARDIOGRAM  11/19/2011   EF=>55%; mild MR, mild mitral annular calcif; mild TR, normal RSVP; AV mildly sclerotic, mild AV regurg; trace pulm valve regurg     Current Medications: Outpatient Medications Prior to Visit  Medication Sig Dispense Refill  . aspirin EC 81 MG tablet Take 1 tablet (81 mg total) by mouth daily.    . benzonatate (TESSALON) 200 MG capsule Take one cap by mouth at bedtime as needed for cough.  May repeat in 4 to 6 hours 15 capsule 0  . Bioflavonoid Products (BIOFLEX PO) Take 1 tablet daily by mouth.    . bisoprolol (ZEBETA) 5 MG tablet Take 1 tablet (5 mg total) by mouth daily. OFFICE VISIT NEEDED 90 tablet 3  . cetirizine (ZYRTEC) 10 MG tablet Take 10 mg by mouth at bedtime.    Marland Kitchen doxycycline (VIBRAMYCIN) 100 MG capsule Take 1 capsule (100 mg total) by mouth 2 (two) times daily. Take with food. 20 capsule 0  . fluticasone (VERAMYST)  27.5 MCG/SPRAY nasal spray Place 2 sprays into the nose daily.    . Fluticasone-Salmeterol (ADVAIR) 250-50 MCG/DOSE AEPB Inhale 1 puff into the lungs 2 (two) times daily. Please schedule appointment for refills 60 each 1  . furosemide (LASIX) 20 MG tablet TAKE ONE TABLET BY MOUTH DAILY AS NEEDED 30 tablet 11  . Multiple Vitamin (MULTIVITAMIN) capsule Take 1 capsule by mouth daily.     No facility-administered medications prior to visit.      Allergies:   Clindamycin/lincomycin, Dabigatran etexilate mesylate, and Doxycycline   Social  History   Socioeconomic History  . Marital status: Married    Spouse name: Not on file  . Number of children: 2  . Years of education: Not on file  . Highest education level: Not on file  Occupational History  . Not on file  Social Needs  . Financial resource strain: Not on file  . Food insecurity    Worry: Not on file    Inability: Not on file  . Transportation needs    Medical: Not on file    Non-medical: Not on file  Tobacco Use  . Smoking status: Former Smoker    Years: 46.00    Quit date: 03/14/2002    Years since quitting: 16.6  . Smokeless tobacco: Never Used  . Tobacco comment: 40 pack year history   Substance and Sexual Activity  . Alcohol use: No  . Drug use: No  . Sexual activity: Not on file  Lifestyle  . Physical activity    Days per week: Not on file    Minutes per session: Not on file  . Stress: Not on file  Relationships  . Social Herbalist on phone: Not on file    Gets together: Not on file    Attends religious service: Not on file    Active member of club or organization: Not on file    Attends meetings of clubs or organizations: Not on file    Relationship status: Not on file  Other Topics Concern  . Not on file  Social History Narrative  . Not on file     Family History:  The patient's family history includes Atrial fibrillation in his brother, brother, and sister; Cancer in his maternal grandmother; Diabetes in his sister and son; Heart Problems in his sister; Heart disease in his maternal grandfather; Heart failure in his brother, father, mother, and sister; Hypertension in his sister; Pancreatitis in his son.   ROS:   Please see the history of present illness.    ROS All other systems reviewed and are negative.   PHYSICAL EXAM:   VS:  There were no vitals taken for this visit.   GEN: Well nourished, well developed, in no acute distress  HEENT: normal  Neck: no JVD, carotid bruits, or masses Cardiac: Widely split S2, RRR; no  murmurs, rubs, or gallops,no edema  Respiratory: Distant breath sounds, but without wheezing and clear to auscultation bilaterally, normal work of breathing GI: soft, nontender, nondistended, + BS MS: no deformity or atrophy  Skin: warm and dry, no rash Neuro:  Alert and Oriented x 3, Strength and sensation are intact Psych: euthymic mood, full affect  Wt Readings from Last 3 Encounters:  03/25/18 194 lb (88 kg)  09/13/17 192 lb (87.1 kg)  08/09/17 192 lb (87.1 kg)      Studies/Labs Reviewed:   EKG:  EKG is ordered today.  It shows sinus bradycardia with mild first-degree AV  block and a single junctional premature beat, right bundle branch block, Q waves in leads V1 and V3 but not in lead V2, suggestive of misplaced leads, no ischemic repolarization abnormalities, QTC 440 ms  Recent Labs: Nov 2017 normal routine chemistries and liver function tests, creatinine 1.0, glucose 96, TSH 2.34, hemoglobin 14.2  Lipid Panel April 2017 cholesterol 153, triglycerides 144, HDL 35, LDL 89  Additional studies/ records that were reviewed today include:  Notes from Dr. Salvadore Dom and Marcelene Butte.  ASSESSMENT:    No diagnosis found.   PLAN:  In order of problems listed above:  1. AFib: Excellent and durable benefit from radiofrequency ablation 9 years ago with only one episode of atrial fibrillation in the interim that occurred during an acute respiratory infection about 4 years ago and none since..  Embolic risk is low increased exclusively due to his age (CHADSVasc 2).  No history of stroke or other embolic events.  On aspirin alone at this time, but this decision will change should he have the slightest or shortest episode of neurological deficits that would suggest TIA or stroke.  He understands that he should call immediately if he develops even brief dysarthria/aphasia, facial droop, loss of limb strength, unexplained disequilibrium, etc.  Beta-blockers have been highly beneficial to suppress  any residual arrhythmia and he is on bisoprolol to prevent worsening of reactive airway disease. 2. COPD: Doing well on long-acting bronchodilators. 3. HTN: Well-controlled.   Medication Adjustments/Labs and Tests Ordered: Current medicines are reviewed at length with the patient today.  Concerns regarding medicines are outlined above.  Medication changes, Labs and Tests ordered today are listed in the Patient Instructions below. Patient Instructions  Medication Instructions:  No changes  If you need a refill on your cardiac medications before your next appointment, please call your pharmacy.   Lab work: Not needed   Testing/Procedures: Not needed  Follow-Up: At [facility], you and your health needs are our priority.  As part of our continuing mission to provide you with exceptional heart care, we have created designated Provider Care Teams.  These Care Teams include your primary Cardiologist (physician) and Advanced Practice Providers (APPs -  Physician Assistants and Nurse Practitioners) who all work together to provide you with the care you need, when you need it. . You will need a follow up appointment in 12  months.  Please call our office 2 months in advance to schedule this appointment.  You may see Dacia Capers or one of the following Advanced Practice Providers on your designated Care Team:   .   Any Other Special Instructions Will Be Listed Below (If Applicable).    Signed, Samuel Klein, MD  10/27/2018 7:50 PM    [address] [place], [place],   [address] Phone: [phone]; Fax: [phone]

## 2018-10-28 ENCOUNTER — Ambulatory Visit (INDEPENDENT_AMBULATORY_CARE_PROVIDER_SITE_OTHER): Payer: Medicare HMO | Admitting: Cardiovascular Disease

## 2018-10-28 ENCOUNTER — Encounter: Payer: Self-pay | Admitting: Cardiovascular Disease

## 2018-10-28 ENCOUNTER — Other Ambulatory Visit: Payer: Self-pay

## 2018-10-28 VITALS — BP 130/71 | HR 56 | Temp 97.2°F | Ht 71.0 in | Wt 191.0 lb

## 2018-10-28 DIAGNOSIS — I10 Essential (primary) hypertension: Secondary | ICD-10-CM

## 2018-10-28 DIAGNOSIS — I480 Paroxysmal atrial fibrillation: Secondary | ICD-10-CM | POA: Diagnosis not present

## 2018-10-28 DIAGNOSIS — J449 Chronic obstructive pulmonary disease, unspecified: Secondary | ICD-10-CM | POA: Diagnosis not present

## 2018-10-28 NOTE — Patient Instructions (Addendum)
Medication Instructions:  No changes  If you need a refill on your cardiac medications before your next appointment, please call your pharmacy.   Lab work: Not needed   Testing/Procedures: Not needed  Follow-Up: At [facility], you and your health needs are our priority.  As part of our continuing mission to provide you with exceptional heart care, we have created designated Provider Care Teams.  These Care Teams include your primary Cardiologist (physician) and Advanced Practice Providers (APPs -  Physician Assistants and Nurse Practitioners) who all work together to provide you with the care you need, when you need it. . You will need a follow up appointment in 12  months.  Please call our office 2 months in advance to schedule this appointment.  You may see Mihai Croitoru or one of the following Advanced Practice Providers on your designated Care Team:   .   Any Other Special Instructions Will Be Listed Below (If Applicable).

## 2018-11-23 ENCOUNTER — Other Ambulatory Visit: Payer: Self-pay | Admitting: Cardiovascular Disease

## 2018-11-23 NOTE — Telephone Encounter (Signed)
Please advise if OK to refill. Thank you! 

## 2018-11-30 ENCOUNTER — Telehealth: Payer: Self-pay | Admitting: Cardiovascular Disease

## 2018-11-30 NOTE — Telephone Encounter (Signed)
°*  STAT* If patient is at the pharmacy, call can be transferred to refill team.   1. Which medications need to be refilled? (please list name of each medication and dose if known) Advair 250/50  2. Which pharmacy/location (including street and city if local pharmacy) is medication to be sent to? [place] [phone]  3. Do they need a 30 day or 90 day supply? Not sure

## 2018-12-01 ENCOUNTER — Other Ambulatory Visit: Payer: Self-pay | Admitting: Cardiovascular Disease

## 2018-12-06 ENCOUNTER — Other Ambulatory Visit: Payer: Self-pay | Admitting: Cardiovascular Disease

## 2019-05-06 ENCOUNTER — Ambulatory Visit: Payer: Medicare HMO | Attending: Internal Medicine

## 2019-05-06 DIAGNOSIS — Z23 Encounter for immunization: Secondary | ICD-10-CM | POA: Insufficient documentation

## 2019-05-06 NOTE — Progress Notes (Signed)
   Covid-19 Vaccination Clinic  Name:  Samuel Mcconnell    MRN: [id number] DOB: May 14, 1927  05/06/2019  Samuel Mcconnell was observed post Covid-19 immunization for 15 minutes without incidence. He was provided with Vaccine Information Sheet and instruction to access the V-Safe system.   Samuel Mcconnell was instructed to call 911 with any severe reactions post vaccine: Marland Kitchen Difficulty breathing  . Swelling of your face and throat  . A fast heartbeat  . A bad rash all over your body  . Dizziness and weakness    Immunizations Administered    Name Date Dose VIS Date Route   Pfizer COVID-19 Vaccine 05/06/2019  8:20 AM 0.3 mL 03/03/2019 Intramuscular   Manufacturer: [place]   Lot: [id number]   [place]: [id number]

## 2019-05-16 DIAGNOSIS — G9601 Cranial cerebrospinal fluid leak, spontaneous: Secondary | ICD-10-CM | POA: Insufficient documentation

## 2019-05-29 ENCOUNTER — Ambulatory Visit: Payer: Medicare HMO | Attending: Internal Medicine

## 2019-05-29 DIAGNOSIS — Z23 Encounter for immunization: Secondary | ICD-10-CM | POA: Insufficient documentation

## 2019-05-29 NOTE — Progress Notes (Signed)
   Covid-19 Vaccination Clinic  Name:  Anubis Blewitt    MRN: [id number] DOB: Jan 04, 1928  05/29/2019  Mr. Billingsley was observed post Covid-19 immunization for 15 minutes without incident. He was provided with Vaccine Information Sheet and instruction to access the V-Safe system.   Mr. Edge was instructed to call 911 with any severe reactions post vaccine: Marland Kitchen Difficulty breathing  . Swelling of face and throat  . A fast heartbeat  . A bad rash all over body  . Dizziness and weakness   Immunizations Administered    Name Date Dose VIS Date Route   Pfizer COVID-19 Vaccine 05/29/2019  8:54 AM 0.3 mL 03/03/2019 Intramuscular   Manufacturer: [place]   Lot: [id number]   [place]: [id number]

## 2019-08-18 ENCOUNTER — Other Ambulatory Visit: Payer: Self-pay | Admitting: Cardiovascular Disease

## 2019-08-29 ENCOUNTER — Other Ambulatory Visit: Payer: Self-pay | Admitting: Cardiovascular Disease

## 2019-09-08 DIAGNOSIS — R600 Localized edema: Secondary | ICD-10-CM | POA: Insufficient documentation

## 2019-09-08 DIAGNOSIS — W010XXA Fall on same level from slipping, tripping and stumbling without subsequent striking against object, initial encounter: Secondary | ICD-10-CM | POA: Insufficient documentation

## 2019-09-27 ENCOUNTER — Telehealth: Payer: Self-pay | Admitting: General Practice

## 2019-09-27 NOTE — Progress Notes (Signed)
Cardiology Clinic Note   Patient Name: Samuel Mcconnell Date of Encounter: 09/28/2019  Primary Care Provider:  Jamesetta Geralds, MD Primary Cardiologist:  Sanda Klein, MD  Patient Profile    Samuel Mcconnell 86 year old male presents to the clinic today for follow-up of his paroxysmal atrial fibrillation.  Past Medical History    Past Medical History:  Diagnosis Date  . Atrial fibrillation ([place])    radiofrequency ablation  . Emphysema   . Hypertension   . Thyroid disease    Past Surgical History:  Procedure Laterality Date  . CARDIAC SURGERY    . COLON SURGERY  1999  . KNEE ARTHROSCOPY Left 07/2000  . LOOP RECORDER IMPLANT  01/16/2010   [employer] Reveal XT (Dr. Norlene Duel)   . RADIOFREQUENCY ABLATION  06/13/2009  . SHOULDER ARTHROSCOPY WITH ROTATOR CUFF REPAIR Left 04/1996  . TIBIAL TUBERCLERPLASTY  ~ 10/2010  . TRANSTHORACIC ECHOCARDIOGRAM  11/19/2011   EF=>55%; mild MR, mild mitral annular calcif; mild TR, normal RSVP; AV mildly sclerotic, mild AV regurg; trace pulm valve regurg     Allergies  Allergies  Allergen Reactions  . Clindamycin/Lincomycin Other (See Comments)    Pt felt weird    . Dabigatran Etexilate Mesylate Other (See Comments)    "felt weird"  . Doxycycline Other (See Comments)    Dyspepsia    History of Present Illness    Samuel Mcconnell has a past medical history of paroxysmal atrial fibrillation that occurred approximately 10 years ago.  Several years before that he had radio frequent ablation and has only had 1 episode since that time.  He was admitted to the emergency department January 2020 for COPD exacerbation and presented to urgent care in June for chills.  HTN, COPD, treated hypothyroidism, and GERD.  Noted to have a longstanding right BBB  When he was last seen by Dr. Loletha Grayer he indicated that he was doing fairly well.  He was compliant with his Advair and only needed additional rescue inhalers occasionally.  He had not been going to the gym to  exercise but was doing exercises outside of the gym on a regular basis.  He had stopped smoking 13 years prior.  He was noted to have mild and chronic right ankle edema following a right ankle infection that occurred decades ago.  He denied chest pain, exertional chest pain, dyspnea, orthopnea, PND, syncope, palpitations, intermittent claudication, weight gain, cough, and bleeding.  Him and his wife had plans to move to a smaller home at that time.  His echocardiogram 2018 showed normal LV systolic function no diastolic dysfunction, left atrium moderately dilated, no significant valvular abnormalities.  He wore a monitor in 2018 that showed PACs and PVCs no evidence of atrial fibrillation.  He suffered a mechanical fall on 617 after carrying a [place] down slippery stairs.  He fell approximately 10 steps hitting his head and his elbow.  On presentation he was found to be in rapid A. fib.  He was started on IV metoprolol however, his rate maintained.  He was started on diltiazem gtt.  He was transitioned to metoprolol 75 twice daily, Cardizem 120, and Eliquis.  His echocardiogram during his admission showed an LVEF of 55-60%, intermediate diastolic dysfunction, mild to moderate TR, moderate pulmonary hypertension.  He presents the clinic today for follow-up evaluation and states he is recovering after his fall.  He states he missed stepped while carrying a vacuum cleaner and tumbled down stairs.  He is now avoiding stairs and states they  are still planning on moving to a 1 level apartment/home.  He does not like that he has been placed on blood thinning medication.  I discussed the risks of CVA and the importance of medication compliance.  His blood pressures have been somewhat low since being placed on metoprolol and diltiazem.  I will decrease his metoprolol to 50 mg twice daily from 75.  I will order a BMP today and have him follow-up with Dr. C in the next 2-3 months.  Today he denies chest pain, shortness  of breath, lower extremity edema, fatigue, palpitations, melena, hematuria, hemoptysis, diaphoresis, weakness, presyncope, syncope, orthopnea, and PND.   Home Medications    Prior to Admission medications   Medication Sig Start Date End Date Taking? Authorizing Provider  aspirin EC 81 MG tablet Take 1 tablet (81 mg total) by mouth daily. 09/13/17   Croitoru, Mihai, MD  benzonatate (TESSALON) 200 MG capsule Take one cap by mouth at bedtime as needed for cough.  May repeat in 4 to 6 hours 03/25/18   Kandra Nicolas, MD  Bioflavonoid Products (BIOFLEX PO) Take 1 tablet daily by mouth.    [provider]  bisoprolol (ZEBETA) 5 MG tablet Take 1 tablet (5 mg total) by mouth daily. OFFICE VISIT NEEDED 08/30/19   Croitoru, Dani Gobble, MD  cetirizine (ZYRTEC) 10 MG tablet Take 10 mg by mouth at bedtime.    [provider]  doxycycline (VIBRAMYCIN) 100 MG capsule Take 1 capsule (100 mg total) by mouth 2 (two) times daily. Take with food. 03/25/18   Kandra Nicolas, MD  fluticasone (VERAMYST) 27.5 MCG/SPRAY nasal spray Place 2 sprays into the nose daily.    [provider]  Fluticasone-Salmeterol (ADVAIR) 250-50 MCG/DOSE AEPB Inhale 1 puff into the lungs 2 (two) times daily. Please schedule appointment for refills 09/14/18   Croitoru, Mihai, MD  furosemide (LASIX) 20 MG tablet TAKE ONE TABLET BY MOUTH DAILY AS NEEDED 05/25/18   Croitoru, Mihai, MD  Multiple Vitamin (MULTIVITAMIN) capsule Take 1 capsule by mouth daily.    [provider]    Family History    Family History  Problem Relation Age of Onset  . Heart failure Father        MI - died @ 58  . Heart failure Mother   . Diabetes Son   . Pancreatitis Son        also ETOH abuse   . Cancer Maternal Grandmother   . Heart disease Maternal Grandfather   . Atrial fibrillation Brother        dx'ed age 20  . Atrial fibrillation Brother        dx'ed age 22  . Heart failure Brother   . Atrial fibrillation Sister   .  Hypertension Sister   . Heart Problems Sister   . Diabetes Sister   . Heart failure Sister    He indicated that the status of his mother is unknown. He indicated that his father is deceased. He indicated that the status of his maternal grandmother is unknown. He indicated that the status of his maternal grandfather is unknown.  Social History    Social History   Socioeconomic History  . Marital status: Married    Spouse name: Not on file  . Number of children: 2  . Years of education: Not on file  . Highest education level: Not on file  Occupational History  . Not on file  Tobacco Use  . Smoking status: Former Smoker  Years: 75.00    Quit date: 03/14/2002    Years since quitting: 17.5  . Smokeless tobacco: Never Used  . Tobacco comment: 40 pack year history   Vaping Use  . Vaping Use: Never used  Substance and Sexual Activity  . Alcohol use: No  . Drug use: No  . Sexual activity: Not on file  Other Topics Concern  . Not on file  Social History Narrative  . Not on file   Social Determinants of Health   Financial Resource Strain:   . Difficulty of Paying Living Expenses:   Food Insecurity:   . Worried About Charity fundraiser in the Last Year:   . Arboriculturist in the Last Year:   Transportation Needs:   . Film/video editor (Medical):   Marland Kitchen Lack of Transportation (Non-Medical):   Physical Activity:   . Days of Exercise per Week:   . Minutes of Exercise per Session:   Stress:   . Feeling of Stress :   Social Connections:   . Frequency of Communication with Friends and Family:   . Frequency of Social Gatherings with Friends and Family:   . Attends Religious Services:   . Active Member of Clubs or Organizations:   . Attends Archivist Meetings:   Marland Kitchen Marital Status:   Intimate Partner Violence:   . Fear of Current or Ex-Partner:   . Emotionally Abused:   Marland Kitchen Physically Abused:   . Sexually Abused:      Review of Systems    General:  No  chills, fever, night sweats or weight changes.  Cardiovascular:  No chest pain, dyspnea on exertion, edema, orthopnea, palpitations, paroxysmal nocturnal dyspnea. Dermatological: No rash, lesions/masses Respiratory: No cough, dyspnea Urologic: No hematuria, dysuria Abdominal:   No nausea, vomiting, diarrhea, bright red blood per rectum, melena, or hematemesis Neurologic:  No visual changes, wkns, changes in mental status. All other systems reviewed and are otherwise negative except as noted above.  Physical Exam    VS:  BP 100/70   Pulse (!) 113   Ht 5\' 11"  (1.803 m)   Wt 195 lb (88.5 kg)   SpO2 96%   BMI 27.20 kg/m  , BMI Body mass index is 27.2 kg/m. GEN: Well nourished, well developed, in no acute distress. HEENT: normal. Neck: Supple, no JVD, carotid bruits, or masses. Cardiac: RRR, no murmurs, rubs, or gallops. No clubbing, cyanosis, edema.  Radials/DP/PT 2+ and equal bilaterally.  Respiratory:  Respirations regular and unlabored, clear to auscultation bilaterally. GI: Soft, nontender, nondistended, BS + x 4. MS: no deformity or atrophy. Skin: warm and dry, no rash. Neuro:  Strength and sensation are intact. Psych: Normal affect.  Accessory Clinical Findings    ECG personally reviewed by me today-wide QRS rhythm with fusion complexes left axis deviation right bundle branch block inferior infarct undetermined age 80 bpm- No acute changes  EKG 10/28/2018 Sinus bradycardia with first-degree AV block, RBBB, anterior infarct undetermined age 69 bpm  Echocardiogram 03/26/2016 Study Conclusions   - Left ventricle: The cavity size was normal. There was moderate  concentric hypertrophy. Systolic function was vigorous. The  estimated ejection fraction was in the range of 65% to 70%. Wall  motion was normal; there were no regional wall motion  abnormalities. Doppler parameters are consistent with abnormal  left ventricular relaxation (grade 1 diastolic dysfunction).   Doppler parameters are consistent with indeterminate ventricular  filling pressure.  - Aortic valve: Transvalvular velocity was within the  normal range.  There was no stenosis. There was mild regurgitation.  - Mitral valve: Transvalvular velocity was within the normal range.  There was no evidence for stenosis. There was no regurgitation.  - Left atrium: The atrium was moderately dilated.  - Right ventricle: The cavity size was normal. Wall thickness was  normal. Systolic function was normal.  - Tricuspid valve: There was trivial regurgitation.  - Pulmonary arteries: Systolic pressure was within the normal  range.  Assessment & Plan   1.  Atrial fibrillation-EKG today showswide QRS rhythm with fusion complexes left axis deviation right bundle branch block inferior infarct undetermined age 34 bpm.  Underwent ablation approximately 10 years ago and has been without atrial fibrillation except for one episode during an acute infection 4-5 years ago.  CHA2DS2-VASc score 2 no history of embolic events/CVA (previously discussed importance of contacting office with neurologic symptoms) Continue metoprolol, Cardizem, Eliquis Continue beta-blocker Heart healthy low-sodium diet-salty 6 given Avoid triggers caffeine, chocolate, EtOH etc. Increase physical activity as tolerated  Essential hypertension-BP today 100/70.  Well-controlled at home Continue metoprolol, Cardizem, furosemide Heart healthy low-sodium diet-salty 6 given Increase physical activity as tolerated Order BMP  COPD-no increased dyspnea on exertion/activity intolerance Continue Advair, rescue inhalers as needed Heart healthy low-sodium diet-salty 6 given Increase physical activity as tolerated  Disposition: Follow-up with Dr. Loletha Grayer 3 months.  Jossie Ng. Aubrie Lucien NP-C    09/28/2019, 11:46 AM [address] Office [phone] Fax [phone]

## 2019-09-27 NOTE — Telephone Encounter (Signed)
Spoke with Hassan Rowan, patients daughter who was requesting to come to her fathers appointment and bring her mother along as well. She has been informed of the visitor policy. Clarified with JC, NP who confirmed that only one visitor could come to the appointment. Hassan Rowan verbalized understanding.

## 2019-09-27 NOTE — Telephone Encounter (Signed)
   Pt's daughter calling, she said pt's wife wants to come in with the pt to his appt tomorrow, the daughter also wanted to come in with them, she said she is the main caregiver. Explained the visitor policy. Advised we can only allow 1 visitor. She said her Mom really wanted to be included in the appt. She asked to send a request for approval.

## 2019-09-28 ENCOUNTER — Telehealth: Payer: Self-pay | Admitting: General Practice

## 2019-09-28 ENCOUNTER — Other Ambulatory Visit: Payer: Self-pay

## 2019-09-28 ENCOUNTER — Ambulatory Visit: Payer: Medicare HMO | Admitting: General Practice

## 2019-09-28 ENCOUNTER — Encounter: Payer: Self-pay | Admitting: General Practice

## 2019-09-28 VITALS — BP 100/70 | HR 113 | Ht 71.0 in | Wt 195.0 lb

## 2019-09-28 DIAGNOSIS — J449 Chronic obstructive pulmonary disease, unspecified: Secondary | ICD-10-CM | POA: Diagnosis not present

## 2019-09-28 DIAGNOSIS — Z79899 Other long term (current) drug therapy: Secondary | ICD-10-CM | POA: Diagnosis not present

## 2019-09-28 DIAGNOSIS — I480 Paroxysmal atrial fibrillation: Secondary | ICD-10-CM | POA: Diagnosis not present

## 2019-09-28 DIAGNOSIS — I10 Essential (primary) hypertension: Secondary | ICD-10-CM

## 2019-09-28 MED ORDER — METOPROLOL TARTRATE 75 MG PO TABS: 50.0000 mg | ORAL_TABLET | Freq: Every day | ORAL | 6 refills | Status: DC

## 2019-09-28 MED ORDER — METOPROLOL TARTRATE 50 MG PO TABS
50.0000 mg | ORAL_TABLET | Freq: Two times a day (BID) | ORAL | 3 refills | Status: DC
Start: 2019-09-28 — End: 2019-10-02

## 2019-09-28 MED ORDER — METOPROLOL TARTRATE 75 MG PO TABS: 50.0000 mg | ORAL_TABLET | Freq: Two times a day (BID) | ORAL | 6 refills | Status: DC

## 2019-09-28 NOTE — Addendum Note (Signed)
Addended by: Waylan Rocher on: 09/28/2019 03:19 PM   Modules accepted: Orders

## 2019-09-28 NOTE — Patient Instructions (Signed)
Medication Instructions:  DECREASE METOPROLOL 50MG  TWICE DAILY *If you need a refill on your cardiac medications before your next appointment, please call your pharmacy*  Lab Work: [facility] [place] If you have labs (blood work) drawn today and your tests are completely normal, you will receive your results only by:  [place] (if you have MyChart) OR A paper copy in the mail.  If you have any lab test that is abnormal or we need to change your treatment, we will call you to review the results. You may go to any [facility] that is convenient for you however, we do have a lab in our office that is able to assist you. You DO NOT need an appointment for our lab. The lab is open 8:00am and closes at 4:00pm. Lunch 12:45 - 1:45pm.  Special Instructions TAKE AND LOG YOU BLOOD PRESSURE DAILY  PLEASE READ AND FOLLOW SALTY 6-ATTACHED  Please try to avoid these triggers:  Do not use any products that have nicotine or tobacco in them. These include cigarettes, e-cigarettes, and chewing tobacco. If you need help quitting, ask your doctor.  Eat heart-healthy foods. Talk with your doctor about the right eating plan for you.  Exercise regularly as told by your doctor.  Do not drink alcohol, Caffeine or chocolate.  Lose weight if you are overweight.  Do not use drugs, including cannabis  Follow-Up: Your next appointment:  ASAP In Person with Sanda Klein, MD  At [facility], you and your health needs are our priority.  As part of our continuing mission to provide you with exceptional heart care, we have created designated Provider Care Teams.  These Care Teams include your primary Cardiologist (physician) and Advanced Practice Providers (APPs -  Physician Assistants and Nurse Practitioners) who all work together to provide you with the care you need, when you need it.

## 2019-09-28 NOTE — Telephone Encounter (Signed)
Spoke with nick, the patient was seen today and the metoprolol script was sent for metoprolol 50 mg once daily. According to the office note the metoprolol should be 50 mg twice daily. Will forward to the covering nurse to confirm dosage and get new script sent to the pharmacy.

## 2019-09-28 NOTE — Telephone Encounter (Signed)
  Pt c/o medication issue:  1. Name of Medication:   Metoprolol Tartrate 75 MG TABS     2. How are you currently taking this medication (dosage and times per day)? Take 50 mg by mouth daily.  3. Are you having a reaction (difficulty breathing--STAT)?   4. What is your medication issue? Samuel Mcconnell from [place] would like to clarify dosage

## 2019-09-29 ENCOUNTER — Telehealth: Payer: Self-pay | Admitting: Cardiovascular Disease

## 2019-09-29 ENCOUNTER — Other Ambulatory Visit: Payer: Self-pay

## 2019-09-29 DIAGNOSIS — Z79899 Other long term (current) drug therapy: Secondary | ICD-10-CM

## 2019-09-29 DIAGNOSIS — I10 Essential (primary) hypertension: Secondary | ICD-10-CM

## 2019-09-29 LAB — BASIC METABOLIC PANEL
BUN/Creatinine Ratio: 24 (ref 10–24)
BUN: 29 mg/dL (ref 10–36)
CO2: 26 mmol/L (ref 20–29)
Calcium: 9.2 mg/dL (ref 8.6–10.2)
Chloride: 102 mmol/L (ref 96–106)
Creatinine, Ser: 1.21 mg/dL (ref 0.76–1.27)
GFR calc Af Amer: 61 mL/min/{1.73_m2} (ref >59)
GFR calc non Af Amer: 52 mL/min/{1.73_m2} — ABNORMAL LOW (ref >59)
Glucose: 90 mg/dL (ref 65–99)
Potassium: 5.6 mmol/L — ABNORMAL HIGH (ref 3.5–5.2)
Sodium: 140 mmol/L (ref 134–144)

## 2019-09-29 NOTE — Telephone Encounter (Signed)
Spoke with Mrs. Samuel Mcconnell regarding new appointment date and time ---Monday 10/02/19 at 11:00 am---arrival time is 10:45 am---she voiced her understanding.

## 2019-09-29 NOTE — Telephone Encounter (Signed)
This has been addressed. Patient has an appointment on Monday 7/12 with Dr. Sallyanne Kuster.

## 2019-09-29 NOTE — Telephone Encounter (Signed)
Patient's wife states she received a call from our office. Do not see any notes. Please advise.

## 2019-10-02 ENCOUNTER — Encounter: Payer: Self-pay | Admitting: Cardiovascular Disease

## 2019-10-02 ENCOUNTER — Other Ambulatory Visit: Payer: Self-pay

## 2019-10-02 ENCOUNTER — Ambulatory Visit: Payer: Medicare HMO | Admitting: Cardiovascular Disease

## 2019-10-02 VITALS — BP 108/74 | HR 139 | Ht 70.0 in | Wt 191.2 lb

## 2019-10-02 DIAGNOSIS — I495 Sick sinus syndrome: Secondary | ICD-10-CM | POA: Diagnosis not present

## 2019-10-02 DIAGNOSIS — I484 Atypical atrial flutter: Secondary | ICD-10-CM

## 2019-10-02 DIAGNOSIS — I5032 Chronic diastolic (congestive) heart failure: Secondary | ICD-10-CM | POA: Diagnosis not present

## 2019-10-02 DIAGNOSIS — I10 Essential (primary) hypertension: Secondary | ICD-10-CM

## 2019-10-02 DIAGNOSIS — I4819 Other persistent atrial fibrillation: Secondary | ICD-10-CM | POA: Diagnosis not present

## 2019-10-02 DIAGNOSIS — J449 Chronic obstructive pulmonary disease, unspecified: Secondary | ICD-10-CM

## 2019-10-02 MED ORDER — FUROSEMIDE 20 MG PO TABS: 20.0000 mg | ORAL_TABLET | Freq: Every day | ORAL | 0 refills | Status: DC

## 2019-10-02 MED ORDER — AMIODARONE HCL 200 MG PO TABS: ORAL_TABLET | ORAL | 0 refills | Status: DC

## 2019-10-02 MED ORDER — DILTIAZEM HCL ER BEADS 120 MG PO CP24: 120.0000 mg | ORAL_CAPSULE | Freq: Every day | ORAL | 0 refills | Status: DC

## 2019-10-02 MED ORDER — APIXABAN 5 MG PO TABS: 5.0000 mg | ORAL_TABLET | Freq: Two times a day (BID) | ORAL | 0 refills | Status: DC

## 2019-10-02 MED ORDER — METOPROLOL TARTRATE 50 MG PO TABS: 50.0000 mg | ORAL_TABLET | Freq: Two times a day (BID) | ORAL | 0 refills | Status: DC

## 2019-10-02 NOTE — Progress Notes (Signed)
Cardiology Office Note    Date:  10/02/2019   ID:  Samuel Mcconnell, DOB [birth year], MRN [id number]  PCP:  Jamesetta Geralds, MD  Cardiologist:   Sanda Klein, MD   Chief Complaint  Patient presents with  . Atrial Fibrillation    History of Present Illness:  Samuel Mcconnell is a 86 y.o. male with history of radiofrequency ablation for atrial fibrillation roughly 10 years ago, recently recurrent.  He is accompanied by his wife and daughter.  On June 17 he fell down the basement stairs while carrying heavy equipment.  On arrival to the hospital [facility]) he was found to be in atrial fibrillation.  Looking back his family wonders whether the arrhythmia may have started a month or 2 before, when he started complaining of some fatigue.  He was hospitalized and rate control was achieved on a combination of metoprolol and diltiazem.  Eliquis was started.  He had an echocardiogram that showed normal left ventricular systolic function with mild to moderate tricuspid regurgitation and moderate pulmonary hypertension.  After a visit in clinic on July 8 his dose of metoprolol was decreased due to hypotension.  He's kept a log of his heart rate and blood pressure.  His blood pressure continues to run relatively low in the 97-108/50-60 mmHg range.  His heart rate has been down to about 70 on a couple of occasions but for the most part has stayed higher than 100.  Today he is in atrial flutter with 2: 1 AV block and a rate of 139 bpm.  He has persistent swelling in both ankles, is always a little worse on the right ankle where he has a remote history of infection.  He has been taking the furosemide daily to keep it at [place].  He does not have orthopnea or PND.  As always he is completely unaware of palpitations.  He has not had syncope or near syncope.  Denies chest pain.  He has not had claudication, focal neurological complaints, falls, bleeding.  His last echocardiogram in 2018 showed normal left  ventricular systolic function and no clear evidence of diastolic dysfunction (his tissue Doppler velocities were actually probably normal for almost 86 years old).  The left atrium was "moderately dilated" (systolic diameter was only 36 mm, but indexed volume was 28.5 which I believe represents very mild dilation).  There is no serious valvular problem.  He underwent a event monitor as if there were 2018 that showed PACs and PVCs but no atrial fibrillation.  He has hypertension, COPD, treated hypothyroidism and acid reflux. He has a long-standing right bundle branch block.  Past Medical History:  Diagnosis Date  . Atrial fibrillation ([place])    radiofrequency ablation  . Emphysema   . Hypertension   . Thyroid disease     Past Surgical History:  Procedure Laterality Date  . CARDIAC SURGERY    . COLON SURGERY  1999  . KNEE ARTHROSCOPY Left 07/2000  . LOOP RECORDER IMPLANT  01/16/2010   Medtronic Reveal XT (Dr. Norlene Duel)   . RADIOFREQUENCY ABLATION  06/13/2009  . SHOULDER ARTHROSCOPY WITH ROTATOR CUFF REPAIR Left 04/1996  . TIBIAL TUBERCLERPLASTY  ~ 10/2010  . TRANSTHORACIC ECHOCARDIOGRAM  11/19/2011   EF=>55%; mild MR, mild mitral annular calcif; mild TR, normal RSVP; AV mildly sclerotic, mild AV regurg; trace pulm valve regurg     Current Medications: Outpatient Medications Prior to Visit  Medication Sig Dispense Refill  . benzonatate (TESSALON) 200 MG capsule Take one cap by  mouth at bedtime as needed for cough.  May repeat in 4 to 6 hours 15 capsule 0  . Bioflavonoid Products (BIOFLEX PO) Take 1 tablet daily by mouth.    . cetirizine (ZYRTEC) 10 MG tablet Take 10 mg by mouth at bedtime.    . Fluticasone-Salmeterol (WIXELA INHUB) 250-50 MCG/DOSE AEPB Inhale 1 puff into the lungs 2 (two) times daily.    Marland Kitchen levothyroxine (SYNTHROID) 125 MCG tablet Take 1 tablet by mouth daily.    . Multiple Vitamin (MULTIVITAMIN) capsule Take 1 capsule by mouth daily.    Marland Kitchen apixaban (ELIQUIS) 5 MG TABS  tablet Take 5 mg by mouth 2 (two) times daily.    Marland Kitchen aspirin EC 81 MG tablet Take 1 tablet (81 mg total) by mouth daily.    Marland Kitchen diltiazem (TIAZAC) 120 MG 24 hr capsule Take 120 mg by mouth daily.    . fluticasone (VERAMYST) 27.5 MCG/SPRAY nasal spray Place 2 sprays into the nose daily.    . Fluticasone-Salmeterol (ADVAIR) 250-50 MCG/DOSE AEPB Inhale 1 puff into the lungs 2 (two) times daily. Please schedule appointment for refills 60 each 1  . furosemide (LASIX) 20 MG tablet TAKE ONE TABLET BY MOUTH DAILY AS NEEDED 30 tablet 11  . metoprolol tartrate (LOPRESSOR) 50 MG tablet Take 1 tablet (50 mg total) by mouth 2 (two) times daily. 180 tablet 3   No facility-administered medications prior to visit.     Allergies:   Clindamycin/lincomycin, Dabigatran etexilate mesylate, and Doxycycline   Social History   Socioeconomic History  . Marital status: Married    Spouse name: Not on file  . Number of children: 2  . Years of education: Not on file  . Highest education level: Not on file  Occupational History  . Not on file  Tobacco Use  . Smoking status: Former Smoker    Years: 46.00    Quit date: 03/14/2002    Years since quitting: 17.5  . Smokeless tobacco: Never Used  . Tobacco comment: 40 pack year history   Vaping Use  . Vaping Use: Never used  Substance and Sexual Activity  . Alcohol use: No  . Drug use: No  . Sexual activity: Not on file  Other Topics Concern  . Not on file  Social History Narrative  . Not on file   Social Determinants of Health   Financial Resource Strain:   . Difficulty of Paying Living Expenses:   Food Insecurity:   . Worried About Charity fundraiser in the Last Year:   . Arboriculturist in the Last Year:   Transportation Needs:   . Film/video editor (Medical):   Marland Kitchen Lack of Transportation (Non-Medical):   Physical Activity:   . Days of Exercise per Week:   . Minutes of Exercise per Session:   Stress:   . Feeling of Stress :   Social  Connections:   . Frequency of Communication with Friends and Family:   . Frequency of Social Gatherings with Friends and Family:   . Attends Religious Services:   . Active Member of Clubs or Organizations:   . Attends Archivist Meetings:   Marland Kitchen Marital Status:      Family History:  The patient's family history includes Atrial fibrillation in his brother, brother, and sister; Cancer in his maternal grandmother; Diabetes in his sister and son; Heart Problems in his sister; Heart disease in his maternal grandfather; Heart failure in his brother, father, mother, and sister; Hypertension  in his sister; Pancreatitis in his son.   ROS:   Please see the history of present illness.    ROS All other systems reviewed and are negative.   PHYSICAL EXAM:   VS:  BP 108/74   Pulse (!) 139   Ht 5\' 10"  (1.778 m)   Wt 191 lb 3.2 oz (86.7 kg)   SpO2 94%   BMI 27.43 kg/m    GEN: Well nourished, well developed, in no acute distress  HEENT: normal  Neck: no JVD, carotid bruits, or masses Cardiac: Widely split S2, tachycardic RRR; no murmurs, rubs, or gallops,no edema  Respiratory: Distant breath sounds, but without wheezing and clear to auscultation bilaterally, normal work of breathing GI: soft, nontender, nondistended, + BS MS: extensive ecchymosis R posterior flank and buttock Skin: warm and dry, no rash Neuro:  Alert and Oriented x 3, Strength and sensation are intact Psych: euthymic mood, full affect  Wt Readings from Last 3 Encounters:  10/02/19 191 lb 3.2 oz (86.7 kg)  09/28/19 195 lb (88.5 kg)  10/28/18 191 lb (86.6 kg)      Studies/Labs Reviewed:   ECHO [facility]  Result Date: 09/10/2019 Left Ventricle: Normal left ventricle. Wall thickness is normal. Systolic function is normal. EF: 55-60%. Wall motion cannot be accurately assessed. Doppler parameters are indeterminate for diastolic function. . Right Ventricle: Right ventricle is mildly dilated. Abnormal tricuspid annular plane  systolic excursion (TAPSE) <1.7 cm. . Right Atrium: Right atrium is mildly dilated. . Tricuspid Valve: Tricuspid valve structure is normal. There is mild to moderate regurgitation. The right ventricular systolic pressure is moderately elevated (50-59 mmHg).   [facility] CT 09/07/2019  IMPRESSION:  1. No rib fracture or pneumothorax identified.  2. Stable atherosclerosis.  3. No definite soft tissue organ injury identified.  4. No significant change hypodense focus left kidney.  5. No free air or fluid identified.    EKG:  EKG is ordered today.  It shows a regular tachycardia at 140 bpm with right bundle branch block morphology.  Suspect this is atrial flutter with 2: 1 AV block.  The right bundle branch block is pre-existing.  Recent Labs: 08/28/2019 TSH 3.53 Hemoglobin 13.5  Lipid Panel 08/28/2019 The request for 160, triglycerides 79, HDL 41, LDL 104  Additional studies/ records that were reviewed today include:  Notes from hospitalization at [facility]. Spirometry 05/16/2019 showing FEV1 1.77 L (68%), FVC 2.51 L) 70%), mild restrictive ventilatory impairment unchanged  ASSESSMENT:    1. Persistent atrial fibrillation ([place])   2. Atypical atrial flutter ([facility])   3. Tachycardia-bradycardia syndrome ([place])   4. Chronic diastolic heart failure ([place])   5. Chronic obstructive pulmonary disease, unspecified COPD type ([place])   6. Essential hypertension      PLAN:  In order of problems listed above:  1. AFib: After about 10 years of excellent result from ablation, he seems to be in persistent atrial arrhythmia for at least a month.  It is likely that this began even before his fall was just unmasked by it.  He is on appropriate anticoagulation and has not had any bleeding problems.  The arrhythmia itself is asymptomatic, but he has evidence of reduced cardiac performance in the form of lower extremity edema and reduced functional status.  Very difficult to rate control despite using both  beta-blocker and diltiazem, especially when he is in the atypical flutter.  Blood pressure limits increasing the doses of these medications.  We will start amiodarone which may provide both  rhythm control and rate control and plan on cardioversion in a couple of weeks if he is still in the arrhythmia.  Discussed the multiple potential side effects of amiodarone but also the limited options we have otherwise. CHADSVasc at least 4 (age 70, HTN, CHF). 2. Tachybradycardia syndrome: He had asymptomatic and relatively mild sinus bradycardia on beta-blockers in the past.  It is possible that after loading with amiodarone and cardioversion a pacemaker may be indicated, if he has symptomatic bradycardia. 3. CHF: He has reduced exercise tolerance and edema consistent with biventricular dysfunction, likely due to the arrhythmia.  Normal left ventricular systolic function on echo, but there was some evidence of right ventricular dysfunction, possibly due to his lung disease. 4. COPD:  on long-acting bronchodilators.  In the past he did better with bisoprolol rather than metoprolol and will plan to switch back once we have achieved better rhythm control. 5. HTN: Well-controlled.   Medication Adjustments/Labs and Tests Ordered: Current medicines are reviewed at length with the patient today.  Concerns regarding medicines are outlined above.  Medication changes, Labs and Tests ordered today are listed in the Patient Instructions below. Patient Instructions  Medication Instructions:  STOP the Aspirin  START AMIODARONE 400 mg twice daily (2 of the 200 mg tablets) for the first week. After that take 400 mg once daily.    *If you need a refill on your cardiac medications before your next appointment, please call your pharmacy*   Lab Work: None ordered If you have labs (blood work) drawn today and your tests are completely normal, you will receive your results only by: Marland Kitchen MyChart Message (if you have MyChart)  OR . A paper copy in the mail If you have any lab test that is abnormal or we need to change your treatment, we will call you to review the results.   Testing/Procedures: None ordered   Follow-Up: At [facility], you and your health needs are our priority.  As part of our continuing mission to provide you with exceptional heart care, we have created designated Provider Care Teams.  These Care Teams include your primary Cardiologist (physician) and Advanced Practice Providers (APPs -  Physician Assistants and Nurse Practitioners) who all work together to provide you with the care you need, when you need it.  We recommend signing up for the patient portal called "MyChart".  Sign up information is provided on this After Visit Summary.  MyChart is used to connect with patients for Virtual Visits (Telemedicine).  Patients are able to view lab/test results, encounter notes, upcoming appointments, etc.  Non-urgent messages can be sent to your provider as well.   To learn more about what you can do with MyChart, go to [email or web address].    Your next appointment:   Keep your appointment with Dr. Sallyanne Kuster on 7/21 at 57 am.      Signed, Sanda Klein, MD  10/02/2019 1:41 PM    [facility] [place], [place], [place]  [address] Phone: [phone]; Fax: [phone]

## 2019-10-02 NOTE — Patient Instructions (Signed)
Medication Instructions:  STOP the Aspirin  START AMIODARONE 400 mg twice daily (2 of the 200 mg tablets) for the first week. After that take 400 mg once daily.    *If you need a refill on your cardiac medications before your next appointment, please call your pharmacy*   Lab Work: None ordered If you have labs (blood work) drawn today and your tests are completely normal, you will receive your results only by: Marland Kitchen MyChart Message (if you have MyChart) OR . A paper copy in the mail If you have any lab test that is abnormal or we need to change your treatment, we will call you to review the results.   Testing/Procedures: None ordered   Follow-Up: At [facility], you and your health needs are our priority.  As part of our continuing mission to provide you with exceptional heart care, we have created designated Provider Care Teams.  These Care Teams include your primary Cardiologist (physician) and Advanced Practice Providers (APPs -  Physician Assistants and Nurse Practitioners) who all work together to provide you with the care you need, when you need it.  We recommend signing up for the patient portal called "MyChart".  Sign up information is provided on this After Visit Summary.  MyChart is used to connect with patients for Virtual Visits (Telemedicine).  Patients are able to view lab/test results, encounter notes, upcoming appointments, etc.  Non-urgent messages can be sent to your provider as well.   To learn more about what you can do with MyChart, go to [email or web address].    Your next appointment:   Keep your appointment with Dr. Sallyanne Kuster on 7/21 at 11 am.

## 2019-10-03 ENCOUNTER — Ambulatory Visit: Payer: Medicare HMO | Admitting: Cardiovascular Disease

## 2019-10-11 ENCOUNTER — Other Ambulatory Visit: Payer: Self-pay

## 2019-10-11 ENCOUNTER — Ambulatory Visit: Payer: Medicare HMO | Admitting: Cardiovascular Disease

## 2019-10-11 ENCOUNTER — Encounter: Payer: Self-pay | Admitting: Cardiovascular Disease

## 2019-10-11 VITALS — BP 110/76 | HR 112 | Ht 71.0 in | Wt 189.8 lb

## 2019-10-11 DIAGNOSIS — Z01810 Encounter for preprocedural cardiovascular examination: Secondary | ICD-10-CM | POA: Diagnosis not present

## 2019-10-11 DIAGNOSIS — I5033 Acute on chronic diastolic (congestive) heart failure: Secondary | ICD-10-CM | POA: Diagnosis not present

## 2019-10-11 DIAGNOSIS — J449 Chronic obstructive pulmonary disease, unspecified: Secondary | ICD-10-CM

## 2019-10-11 DIAGNOSIS — I495 Sick sinus syndrome: Secondary | ICD-10-CM | POA: Diagnosis not present

## 2019-10-11 DIAGNOSIS — I10 Essential (primary) hypertension: Secondary | ICD-10-CM

## 2019-10-11 DIAGNOSIS — I484 Atypical atrial flutter: Secondary | ICD-10-CM

## 2019-10-11 DIAGNOSIS — Z01818 Encounter for other preprocedural examination: Secondary | ICD-10-CM

## 2019-10-11 NOTE — Patient Instructions (Signed)
Medication Instructions:  STOP the Diltiazem  *If you need a refill on your cardiac medications before your next appointment, please call your pharmacy*   Follow-Up: At [facility], you and your health needs are our priority.  As part of our continuing mission to provide you with exceptional heart care, we have created designated Provider Care Teams.  These Care Teams include your primary Cardiologist (physician) and Advanced Practice Providers (APPs -  Physician Assistants and Nurse Practitioners) who all work together to provide you with the care you need, when you need it.  We recommend signing up for the patient portal called "MyChart".  Sign up information is provided on this After Visit Summary.  MyChart is used to connect with patients for Virtual Visits (Telemedicine).  Patients are able to view lab/test results, encounter notes, upcoming appointments, etc.  Non-urgent messages can be sent to your provider as well.   To learn more about what you can do with MyChart, go to [email or web address].    Your next appointment:   Follow up with Dr. Sallyanne Kuster one month after the procedure  Other Instructions  You are scheduled for a Cardioversion on 10/24/19 with Dr. Sallyanne Kuster.  Please arrive at the [facility] (Main Entrance A) at [facility]: [address] [place], [place] [address] at 8:30 am. (1 hour prior to procedure)  DIET: Nothing to eat or drink after midnight except a sip of water with medications (see medication instructions below)  Medication Instructions: Hold Furosemide the morning of the procedure Hold the Metoprolol the morning of the procedure  Continue your anticoagulant: Eliquis. Do not miss any doses, if you do then please call [phone]. You will need to continue your anticoagulant after your procedure until you  are told by your provider that it is safe to stop   Labs:  Your provider would like for you to return on 10/18/19 to have the following labs  drawn: BMET and CBC. You do not need an appointment for the lab. Once in our office lobby there is a podium where you can sign in and ring the doorbell to alert [place] that you are here. The lab is open from 8:00 am to 4:30 pm; closed for lunch from 12:45pm-1:45pm.  You will need to have the coronavirus test completed prior to your procedure. An appointment has been made at 3:05 pm on 10/20/19. This is a Drive Up Visit at the [employer] [address] Someone will direct you to the appropriate testing line. Please tell them that you are there for procedure testing. Stay in your car and someone will be with you shortly. Please make sure to have all other labs completed before this test because you will need to stay quarantined until your procedure.    You must have a responsible person to drive you home and stay in the waiting area during your procedure. Failure to do so could result in cancellation.  Bring your insurance cards.  *Special Note: Every effort is made to have your procedure done on time. Occasionally there are emergencies that occur at the hospital that may cause delays. Please be patient if a delay does occur.

## 2019-10-11 NOTE — H&P (View-Only) (Signed)
Cardiology Office Note    Date:  10/11/2019   ID:  [name], DOB 1927-10-31, MRN [id number]  PCP:  Jamesetta Geralds, MD  Cardiologist:   Sanda Klein, MD   Chief Complaint  Patient presents with  . Irregular Heart Beat  . Congestive Heart Failure    History of Present Illness:  Samuel Mcconnell is a 86 y.o. male with history of radiofrequency ablation for atrial fibrillation roughly 10 years ago, recently recurrent.  He is accompanied by his daughter.  On June 17 he fell down the basement stairs while carrying heavy equipment.  On arrival to the hospital [facility]) he was found to be in atrial fibrillation.  Looking back his family wonders whether the arrhythmia may have started a month or 2 before, when he started complaining of some fatigue.  He was hospitalized and rate control was achieved on a combination of metoprolol and diltiazem.  Eliquis was started.  He had an echocardiogram that showed normal left ventricular systolic function with mild to moderate tricuspid regurgitation and moderate pulmonary hypertension.  After a visit in clinic on July 8 his dose of metoprolol was decreased due to hypotension.  Subsequently his blood pressure was relatively low around 100/60 and his heart rate has been elevated 100-140.  At his last appointment on July 12 we increased his diuretic and added amiodarone  He brings another log of his vital signs.  His blood pressure has been a little better mostly in the 110/70 range, although he still has occasional periods with systolic blood pressure less than 100.  His heart rate has shown an overall improving trend with rates now in the 100-110s.  His ankle and pretibial edema has really not improved, although his weight has decreased by a couple of pounds.  As before the swelling is a little worse on the right side.  He denies frank orthopnea or PND and has not had dizziness or syncope.  He thinks his breathing is a little better.  he has been  trying to walk a little bit with support.  He has not had angina pectoris.  He denies falls or injuries or bleeding and has been compliant with Eliquis anticoagulation.  He has not yet had a repeat echocardiogram.  His last echocardiogram in 2018 showed normal left ventricular systolic function and no clear evidence of diastolic dysfunction (his tissue Doppler velocities were actually probably normal for almost 86 years old).  The left atrium was "moderately dilated" (systolic diameter was only 36 mm, but indexed volume was 28.5 which I believe represents very mild dilation).  There is no serious valvular problem.  He underwent a event monitor as if there were 2018 that showed PACs and PVCs but no atrial fibrillation.  He has hypertension, COPD, treated hypothyroidism and acid reflux. He has a long-standing right bundle branch block.  Past Medical History:  Diagnosis Date  . Atrial fibrillation ([place])    radiofrequency ablation  . Emphysema   . Hypertension   . Thyroid disease     Past Surgical History:  Procedure Laterality Date  . CARDIAC SURGERY    . COLON SURGERY  1999  . KNEE ARTHROSCOPY Left 07/2000  . LOOP RECORDER IMPLANT  01/16/2010   Medtronic Reveal XT (Dr. Norlene Duel)   . RADIOFREQUENCY ABLATION  06/13/2009  . SHOULDER ARTHROSCOPY WITH ROTATOR CUFF REPAIR Left 04/1996  . TIBIAL TUBERCLERPLASTY  ~ 10/2010  . TRANSTHORACIC ECHOCARDIOGRAM  11/19/2011   EF=>55%; mild MR, mild mitral annular calcif; mild  TR, normal RSVP; AV mildly sclerotic, mild AV regurg; trace pulm valve regurg     Current Medications: Outpatient Medications Prior to Visit  Medication Sig Dispense Refill  . amiodarone (PACERONE) 200 MG tablet Take 400 mg  twice daily for the first week, then take 400 mg (2 tablets) once daily. 60 tablet 0  . apixaban (ELIQUIS) 5 MG TABS tablet Take 1 tablet (5 mg total) by mouth 2 (two) times daily. 60 tablet 0  . Ascorbic Acid (VITAMIN C) 1000 MG tablet Take 1,000 mg by mouth  daily.    . benzonatate (TESSALON) 200 MG capsule Take one cap by mouth at bedtime as needed for cough.  May repeat in 4 to 6 hours 15 capsule 0  . cetirizine (ZYRTEC) 10 MG tablet Take 10 mg by mouth at bedtime.    . Fluticasone-Salmeterol (WIXELA INHUB) 250-50 MCG/DOSE AEPB Inhale 1 puff into the lungs 2 (two) times daily.    . furosemide (LASIX) 20 MG tablet Take 1 tablet (20 mg total) by mouth daily. 30 tablet 0  . levothyroxine (SYNTHROID) 125 MCG tablet Take 1 tablet by mouth daily.    . metoprolol tartrate (LOPRESSOR) 50 MG tablet Take 1 tablet (50 mg total) by mouth 2 (two) times daily. 60 tablet 0  . Multiple Vitamin (MULTIVITAMIN) capsule Take 1 capsule by mouth daily.    . Zinc 50 MG TABS Take 50 mg by mouth.    Marland Kitchen Bioflavonoid Products (BIOFLEX PO) Take 1 tablet daily by mouth.    . diltiazem (TIAZAC) 120 MG 24 hr capsule Take 1 capsule (120 mg total) by mouth daily. 30 capsule 0   No facility-administered medications prior to visit.     Allergies:   Clindamycin/lincomycin, Dabigatran etexilate mesylate, and Doxycycline   Social History   Socioeconomic History  . Marital status: Married    Spouse name: Not on file  . Number of children: 2  . Years of education: Not on file  . Highest education level: Not on file  Occupational History  . Not on file  Tobacco Use  . Smoking status: Former Smoker    Years: 46.00    Quit date: 03/14/2002    Years since quitting: 17.5  . Smokeless tobacco: Never Used  . Tobacco comment: 40 pack year history   Vaping Use  . Vaping Use: Never used  Substance and Sexual Activity  . Alcohol use: No  . Drug use: No  . Sexual activity: Not on file  Other Topics Concern  . Not on file  Social History Narrative  . Not on file   Social Determinants of Health   Financial Resource Strain:   . Difficulty of Paying Living Expenses:   Food Insecurity:   . Worried About Charity fundraiser in the Last Year:   . Arboriculturist in the Last  Year:   Transportation Needs:   . Film/video editor (Medical):   Marland Kitchen Lack of Transportation (Non-Medical):   Physical Activity:   . Days of Exercise per Week:   . Minutes of Exercise per Session:   Stress:   . Feeling of Stress :   Social Connections:   . Frequency of Communication with Friends and Family:   . Frequency of Social Gatherings with Friends and Family:   . Attends Religious Services:   . Active Member of Clubs or Organizations:   . Attends Archivist Meetings:   Marland Kitchen Marital Status:      Family  History:  The patient's family history includes Atrial fibrillation in his brother, brother, and sister; Cancer in his maternal grandmother; Diabetes in his sister and son; Heart Problems in his sister; Heart disease in his maternal grandfather; Heart failure in his brother, father, mother, and sister; Hypertension in his sister; Pancreatitis in his son.   ROS:   Please see the history of present illness.    ROS All other systems are reviewed and are negative.   PHYSICAL EXAM:   VS:  BP 110/76   Pulse (!) 112   Ht 5\' 11"  (1.803 m)   Wt 189 lb 12.8 oz (86.1 kg)   SpO2 94%   BMI 26.47 kg/m     General: Alert, oriented x3, no distress, appears younger than stated age Head: no evidence of trauma, PERRL, EOMI, no exophtalmos or lid lag, no myxedema, no xanthelasma; normal ears, nose and oropharynx Neck: Five 6 cm elevation in jugular venous pulsations and hepatojugular reflux; brisk carotid pulses without delay and no carotid bruits Chest: clear to auscultation, no signs of consolidation by percussion or palpation, normal fremitus, symmetrical and full respiratory excursions Cardiovascular: normal position and quality of the apical impulse, regular rhythm, tachycardic, normal first and widely split second heart sounds, no murmurs, rubs or gallops Abdomen: no tenderness or distention, no masses by palpation, no abnormal pulsatility or arterial bruits, normal bowel sounds,  no hepatosplenomegaly Extremities: no clubbing, cyanosis; 2+ symmetrical pitting edema of the ankles and lower half of the pretibial area; 2+ radial, ulnar and brachial pulses bilaterally; 2+ right femoral, posterior tibial and dorsalis pedis pulses; 2+ left femoral, posterior tibial and dorsalis pedis pulses; no subclavian or femoral bruits Neurological: grossly nonfocal Psych: Normal mood and affect   Wt Readings from Last 3 Encounters:  10/11/19 189 lb 12.8 oz (86.1 kg)  10/02/19 191 lb 3.2 oz (86.7 kg)  09/28/19 195 lb (88.5 kg)      Studies/Labs Reviewed:   ECHO [facility]  Result Date: 09/10/2019 Left Ventricle: Normal left ventricle. Wall thickness is normal. Systolic function is normal. EF: 55-60%. Wall motion cannot be accurately assessed. Doppler parameters are indeterminate for diastolic function. . Right Ventricle: Right ventricle is mildly dilated. Abnormal tricuspid annular plane systolic excursion (TAPSE) <1.7 cm. . Right Atrium: Right atrium is mildly dilated. . Tricuspid Valve: Tricuspid valve structure is normal. There is mild to moderate regurgitation. The right ventricular systolic pressure is moderately elevated (50-59 mmHg).   [facility] CT 09/07/2019  IMPRESSION:  1. No rib fracture or pneumothorax identified.  2. Stable atherosclerosis.  3. No definite soft tissue organ injury identified.  4. No significant change hypodense focus left kidney.  5. No free air or fluid identified.    EKG:  EKG is ordered today.  It shows a regular tachycardia at 140 bpm with right bundle branch block morphology.  Suspect this is atrial flutter with 2: 1 AV block.  The right bundle branch block is pre-existing.  Recent Labs: 08/28/2019 TSH 3.53 Hemoglobin 13.5  Lipid Panel 08/28/2019 The request for 160, triglycerides 79, HDL 41, LDL 104  Additional studies/ records that were reviewed today include:  Notes from hospitalization at [facility]. Spirometry 05/16/2019 showing FEV1  1.77 L (68%), FVC 2.51 L) 70%), mild restrictive ventilatory impairment unchanged  ASSESSMENT:    1. Atypical atrial flutter ([place])   2. Tachycardia-bradycardia syndrome ([place])   3. Acute on chronic diastolic heart failure ([place])   4. Chronic obstructive pulmonary disease, unspecified COPD type ([place])  5. Essential hypertension   6. Pre-op evaluation      PLAN:  In order of problems listed above:  1. AFib: He remains in atypical atrial flutter with 2: 1 AV block.  It does appear that the amiodarone is having an effect and prolonging the atrial flutter cycle length as the atrial rate has decreased by about 15-20%.  He is on appropriate anticoagulation and has not had any bleeding problems.  The arrhythmia itself is asymptomatic, but he has evidence of reduced cardiac performance in the form of lower extremity edema and reduced functional status.  Blood pressure again limits the use of higher doses of rate control medications.  In fact, I am concerned that the diltiazem may be hurting rather than helping with the overall heart failure picture.  I think we need to proceed with cardioversion, with the understanding that his underlying sinus rate may be very slow due to the effects of amiodarone and that he may eventually need a pacemaker.  Reviewed cardioversion and deep sedation with the support of anesthesiology in detail, risks and benefits. This procedure has been fully reviewed with the patient and informed consent has been obtained.  Tentatively scheduled the procedure for August 3, by which time he will have been on amiodarone for about 3 weeks. CHADSVasc at least 4 (age 89, HTN, CHF). 2. Tachybradycardia syndrome: He had asymptomatic and relatively mild sinus bradycardia on beta-blockers in the past.  It is possible that after loading with amiodarone and cardioversion a pacemaker may be indicated, if he has symptomatic bradycardia.  Stopping the diltiazem today.  Told him to hold his dose of  metoprolol on the morning of cardioversion. 3. CHF: Very slight reduction in overall hypervolemia.  He has reduced exercise tolerance and edema consistent with biventricular dysfunction, likely due to the arrhythmia.  Normal left ventricular systolic function on echo, but there was some evidence of right ventricular dysfunction, possibly due to his lung disease. 4. COPD:  on long-acting bronchodilators.  In the past he did better with bisoprolol rather than metoprolol and will plan to switch back once we have achieved better rhythm control. 5. HTN:  blood pressure is low since he has been in atrial flutter.   Medication Adjustments/Labs and Tests Ordered: Current medicines are reviewed at length with the patient today.  Concerns regarding medicines are outlined above.  Medication changes, Labs and Tests ordered today are listed in the Patient Instructions below. Patient Instructions  Medication Instructions:  STOP the Diltiazem  *If you need a refill on your cardiac medications before your next appointment, please call your pharmacy*   Follow-Up: At [facility], you and your health needs are our priority.  As part of our continuing mission to provide you with exceptional heart care, we have created designated Provider Care Teams.  These Care Teams include your primary Cardiologist (physician) and Advanced Practice Providers (APPs -  Physician Assistants and Nurse Practitioners) who all work together to provide you with the care you need, when you need it.  We recommend signing up for the patient portal called "MyChart".  Sign up information is provided on this After Visit Summary.  MyChart is used to connect with patients for Virtual Visits (Telemedicine).  Patients are able to view lab/test results, encounter notes, upcoming appointments, etc.  Non-urgent messages can be sent to your provider as well.   To learn more about what you can do with MyChart, go to [email or web address].    Your  next appointment:  Follow up with Dr. Sallyanne Kuster one month after the procedure  Other Instructions  You are scheduled for a Cardioversion on 10/24/19 with Dr. Sallyanne Kuster.  Please arrive at the [facility] (Main Entrance A) at [facility]: [address] [place], [place] [address] at 8:30 am. (1 hour prior to procedure)  DIET: Nothing to eat or drink after midnight except a sip of water with medications (see medication instructions below)  Medication Instructions: Hold Furosemide the morning of the procedure Hold the Metoprolol the morning of the procedure  Continue your anticoagulant: Eliquis. Do not miss any doses, if you do then please call [phone]. You will need to continue your anticoagulant after your procedure until you  are told by your provider that it is safe to stop   Labs:  Your provider would like for you to return on 10/18/19 to have the following labs drawn: BMET and CBC. You do not need an appointment for the lab. Once in our office lobby there is a podium where you can sign in and ring the doorbell to alert [place] that you are here. The lab is open from 8:00 am to 4:30 pm; closed for lunch from 12:45pm-1:45pm.  You will need to have the coronavirus test completed prior to your procedure. An appointment has been made at 3:05 pm on 10/20/19. This is a Drive Up Visit at the [facility] [address] Someone will direct you to the appropriate testing line. Please tell them that you are there for procedure testing. Stay in your car and someone will be with you shortly. Please make sure to have all other labs completed before this test because you will need to stay quarantined until your procedure.    You must have a responsible person to drive you home and stay in the waiting area during your procedure. Failure to do so could result in cancellation.  Bring your insurance cards.  *Special Note: Every effort is made to have your procedure done on time.  Occasionally there are emergencies that occur at the hospital that may cause delays. Please be patient if a delay does occur.         Signed, Sanda Klein, MD  10/11/2019 2:38 PM    [address], [place], [place]  [address] Phone: [phone]; Fax: [phone]

## 2019-10-11 NOTE — Progress Notes (Signed)
Cardiology Office Note    Date:  10/11/2019   ID:  Samuel Mcconnell, DOB 01-03-1928, MRN [id number]  PCP:  Jamesetta Geralds, MD  Cardiologist:   Sanda Klein, MD   Chief Complaint  Patient presents with   Irregular Heart Beat   Congestive Heart Failure    History of Present Illness:  Samuel Mcconnell is a 86 y.o. male with history of radiofrequency ablation for atrial fibrillation roughly 10 years ago, recently recurrent.  He is accompanied by his daughter.  On June 17 he fell down the basement stairs while carrying heavy equipment.  On arrival to the hospital [facility]) he was found to be in atrial fibrillation.  Looking back his family wonders whether the arrhythmia may have started a month or 2 before, when he started complaining of some fatigue.  He was hospitalized and rate control was achieved on a combination of metoprolol and diltiazem.  Eliquis was started.  He had an echocardiogram that showed normal left ventricular systolic function with mild to moderate tricuspid regurgitation and moderate pulmonary hypertension.  After a visit in clinic on July 8 his dose of metoprolol was decreased due to hypotension.  Subsequently his blood pressure was relatively low around 100/60 and his heart rate has been elevated 100-140.  At his last appointment on July 12 we increased his diuretic and added amiodarone  He brings another log of his vital signs.  His blood pressure has been a little better mostly in the 110/70 range, although he still has occasional periods with systolic blood pressure less than 100.  His heart rate has shown an overall improving trend with rates now in the 100-110s.  His ankle and pretibial edema has really not improved, although his weight has decreased by a couple of pounds.  As before the swelling is a little worse on the right side.  He denies frank orthopnea or PND and has not had dizziness or syncope.  He thinks his breathing is a little better.  he has been  trying to walk a little bit with support.  He has not had angina pectoris.  He denies falls or injuries or bleeding and has been compliant with Eliquis anticoagulation.  He has not yet had a repeat echocardiogram.  His last echocardiogram in 2018 showed normal left ventricular systolic function and no clear evidence of diastolic dysfunction (his tissue Doppler velocities were actually probably normal for almost 86 years old).  The left atrium was "moderately dilated" (systolic diameter was only 36 mm, but indexed volume was 28.5 which I believe represents very mild dilation).  There is no serious valvular problem.  He underwent a event monitor as if there were 2018 that showed PACs and PVCs but no atrial fibrillation.  He has hypertension, COPD, treated hypothyroidism and acid reflux. He has a long-standing right bundle branch block.  Past Medical History:  Diagnosis Date   Atrial fibrillation [facility])    radiofrequency ablation   Emphysema    Hypertension    Thyroid disease     Past Surgical History:  Procedure Laterality Date   CARDIAC SURGERY     COLON SURGERY  1999   KNEE ARTHROSCOPY Left 07/2000   LOOP RECORDER IMPLANT  01/16/2010   [employer] Reveal XT (Dr. Norlene Duel)    RADIOFREQUENCY ABLATION  06/13/2009   SHOULDER ARTHROSCOPY WITH ROTATOR CUFF REPAIR Left 04/1996   TIBIAL TUBERCLERPLASTY  ~ 10/2010   TRANSTHORACIC ECHOCARDIOGRAM  11/19/2011   EF=>55%; mild MR, mild mitral annular calcif; mild  TR, normal RSVP; AV mildly sclerotic, mild AV regurg; trace pulm valve regurg     Current Medications: Outpatient Medications Prior to Visit  Medication Sig Dispense Refill   amiodarone (PACERONE) 200 MG tablet Take 400 mg  twice daily for the first week, then take 400 mg (2 tablets) once daily. 60 tablet 0   apixaban (ELIQUIS) 5 MG TABS tablet Take 1 tablet (5 mg total) by mouth 2 (two) times daily. 60 tablet 0   Ascorbic Acid (VITAMIN C) 1000 MG tablet Take 1,000 mg by mouth  daily.     benzonatate (TESSALON) 200 MG capsule Take one cap by mouth at bedtime as needed for cough.  May repeat in 4 to 6 hours 15 capsule 0   cetirizine (ZYRTEC) 10 MG tablet Take 10 mg by mouth at bedtime.     Fluticasone-Salmeterol (WIXELA INHUB) 250-50 MCG/DOSE AEPB Inhale 1 puff into the lungs 2 (two) times daily.     furosemide (LASIX) 20 MG tablet Take 1 tablet (20 mg total) by mouth daily. 30 tablet 0   levothyroxine (SYNTHROID) 125 MCG tablet Take 1 tablet by mouth daily.     metoprolol tartrate (LOPRESSOR) 50 MG tablet Take 1 tablet (50 mg total) by mouth 2 (two) times daily. 60 tablet 0   Multiple Vitamin (MULTIVITAMIN) capsule Take 1 capsule by mouth daily.     Zinc 50 MG TABS Take 50 mg by mouth.     Bioflavonoid Products (BIOFLEX PO) Take 1 tablet daily by mouth.     diltiazem (TIAZAC) 120 MG 24 hr capsule Take 1 capsule (120 mg total) by mouth daily. 30 capsule 0   No facility-administered medications prior to visit.     Allergies:   Clindamycin/lincomycin, Dabigatran etexilate mesylate, and Doxycycline   Social History   Socioeconomic History   Marital status: Married    Spouse name: Not on file   Number of children: 2   Years of education: Not on file   Highest education level: Not on file  Occupational History   Not on file  Tobacco Use   Smoking status: Former Smoker    Years: 46.00    Quit date: 03/14/2002    Years since quitting: 17.5   Smokeless tobacco: Never Used   Tobacco comment: 40 pack year history   Vaping Use   Vaping Use: Never used  Substance and Sexual Activity   Alcohol use: No   Drug use: No   Sexual activity: Not on file  Other Topics Concern   Not on file  Social History Narrative   Not on file   Social Determinants of Health   Financial Resource Strain:    Difficulty of Paying Living Expenses:   Food Insecurity:    Worried About Charity fundraiser in the Last Year:    Arboriculturist in the Last  Year:   Transportation Needs:    Film/video editor (Medical):    Lack of Transportation (Non-Medical):   Physical Activity:    Days of Exercise per Week:    Minutes of Exercise per Session:   Stress:    Feeling of Stress :   Social Connections:    Frequency of Communication with Friends and Family:    Frequency of Social Gatherings with Friends and Family:    Attends Religious Services:    Active Member of Clubs or Organizations:    Attends Archivist Meetings:    Marital Status:      Family  History:  The patient's family history includes Atrial fibrillation in his brother, brother, and sister; Cancer in his maternal grandmother; Diabetes in his sister and son; Heart Problems in his sister; Heart disease in his maternal grandfather; Heart failure in his brother, father, mother, and sister; Hypertension in his sister; Pancreatitis in his son.   ROS:   Please see the history of present illness.    ROS All other systems are reviewed and are negative.   PHYSICAL EXAM:   VS:  BP 110/76    Pulse (!) 112    Ht 5\' 11"  (1.803 m)    Wt 189 lb 12.8 oz (86.1 kg)    SpO2 94%    BMI 26.47 kg/m     General: Alert, oriented x3, no distress, appears younger than stated age Head: no evidence of trauma, PERRL, EOMI, no exophtalmos or lid lag, no myxedema, no xanthelasma; normal ears, nose and oropharynx Neck: Five 6 cm elevation in jugular venous pulsations and hepatojugular reflux; brisk carotid pulses without delay and no carotid bruits Chest: clear to auscultation, no signs of consolidation by percussion or palpation, normal fremitus, symmetrical and full respiratory excursions Cardiovascular: normal position and quality of the apical impulse, regular rhythm, tachycardic, normal first and widely split second heart sounds, no murmurs, rubs or gallops Abdomen: no tenderness or distention, no masses by palpation, no abnormal pulsatility or arterial bruits, normal bowel sounds,  no hepatosplenomegaly Extremities: no clubbing, cyanosis; 2+ symmetrical pitting edema of the ankles and lower half of the pretibial area; 2+ radial, ulnar and brachial pulses bilaterally; 2+ right femoral, posterior tibial and dorsalis pedis pulses; 2+ left femoral, posterior tibial and dorsalis pedis pulses; no subclavian or femoral bruits Neurological: grossly nonfocal Psych: Normal mood and affect   Wt Readings from Last 3 Encounters:  10/11/19 189 lb 12.8 oz (86.1 kg)  10/02/19 191 lb 3.2 oz (86.7 kg)  09/28/19 195 lb (88.5 kg)      Studies/Labs Reviewed:   ECHO [facility]  Result Date: 09/10/2019 Left Ventricle: Normal left ventricle. Wall thickness is normal. Systolic function is normal. EF: 55-60%. Wall motion cannot be accurately assessed. Doppler parameters are indeterminate for diastolic function.  Right Ventricle: Right ventricle is mildly dilated. Abnormal tricuspid annular plane systolic excursion (TAPSE) <1.7 cm.  Right Atrium: Right atrium is mildly dilated.  Tricuspid Valve: Tricuspid valve structure is normal. There is mild to moderate regurgitation. The right ventricular systolic pressure is moderately elevated (50-59 mmHg).   [facility] CT 09/07/2019  IMPRESSION:  1. No rib fracture or pneumothorax identified.  2. Stable atherosclerosis.  3. No definite soft tissue organ injury identified.  4. No significant change hypodense focus left kidney.  5. No free air or fluid identified.    EKG:  EKG is ordered today.  It shows a regular tachycardia at 140 bpm with right bundle branch block morphology.  Suspect this is atrial flutter with 2: 1 AV block.  The right bundle branch block is pre-existing.  Recent Labs: 08/28/2019 TSH 3.53 Hemoglobin 13.5  Lipid Panel 08/28/2019 The request for 160, triglycerides 79, HDL 41, LDL 104  Additional studies/ records that were reviewed today include:  Notes from hospitalization at [facility]. Spirometry 05/16/2019 showing FEV1  1.77 L (68%), FVC 2.51 L) 70%), mild restrictive ventilatory impairment unchanged  ASSESSMENT:    1. 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 Acute on chronic diastolic heart failure ([place])   4. Chronic obstructive pulmonary disease, unspecified  COPD type ([place])   5. Essential hypertension   6. Pre-op evaluation      PLAN:  In order of problems listed above:  1. AFib: He remains in atypical atrial flutter with 2: 1 AV block.  It does appear that the amiodarone is having an effect and prolonging the atrial flutter cycle length as the atrial rate has decreased by about 15-20%.  He is on appropriate anticoagulation and has not had any bleeding problems.  The arrhythmia itself is asymptomatic, but he has evidence of reduced cardiac performance in the form of lower extremity edema and reduced functional status.  Blood pressure again limits the use of higher doses of rate control medications.  In fact, I am concerned that the diltiazem may be hurting rather than helping with the overall heart failure picture.  I think we need to proceed with cardioversion, with the understanding that his underlying sinus rate may be very slow due to the effects of amiodarone and that he may eventually need a pacemaker.  Reviewed cardioversion and deep sedation with the support of anesthesiology in detail, risks and benefits. This procedure has been fully reviewed with the patient and informed consent has been obtained.  Tentatively scheduled the procedure for August 3, by which time he will have been on amiodarone for about 3 weeks. CHADSVasc at least 4 (age 39, HTN, CHF). 2. Tachybradycardia syndrome: He had asymptomatic and relatively mild sinus bradycardia on beta-blockers in the past.  It is possible that after loading with amiodarone and cardioversion a pacemaker may be indicated, if he has symptomatic bradycardia.  Stopping the diltiazem today.  Told him to hold his dose of  metoprolol on the morning of cardioversion. 3. CHF: Very slight reduction in overall hypervolemia.  He has reduced exercise tolerance and edema consistent with biventricular dysfunction, likely due to the arrhythmia.  Normal left ventricular systolic function on echo, but there was some evidence of right ventricular dysfunction, possibly due to his lung disease. 4. COPD:  on long-acting bronchodilators.  In the past he did better with bisoprolol rather than metoprolol and will plan to switch back once we have achieved better rhythm control. 5. HTN:  blood pressure is low since he has been in atrial flutter.   Medication Adjustments/Labs and Tests Ordered: Current medicines are reviewed at length with the patient today.  Concerns regarding medicines are outlined above.  Medication changes, Labs and Tests ordered today are listed in the Patient Instructions below. Patient Instructions  Medication Instructions:  STOP the Diltiazem  *If you need a refill on your cardiac medications before your next appointment, please call your pharmacy*   Follow-Up: At [facility], you and your health needs are our priority.  As part of our continuing mission to provide you with exceptional heart care, we have created designated Provider Care Teams.  These Care Teams include your primary Cardiologist (physician) and Advanced Practice Providers (APPs -  Physician Assistants and Nurse Practitioners) who all work together to provide you with the care you need, when you need it.  We recommend signing up for the patient portal called "MyChart".  Sign up information is provided on this After Visit Summary.  MyChart is used to connect with patients for Virtual Visits (Telemedicine).  Patients are able to view lab/test results, encounter notes, upcoming appointments, etc.  Non-urgent messages can be sent to your provider as well.   To learn more about what you can do with MyChart, go to [email or web address].    Your  next appointment:   Follow up with Dr. Sallyanne Kuster one month after the procedure  Other Instructions  You are scheduled for a Cardioversion on 10/24/19 with Dr. Sallyanne Kuster.  Please arrive at the [facility] (Main Entrance A) at [facility]: [address] [place], [place] [address] at 8:30 am. (1 hour prior to procedure)  DIET: Nothing to eat or drink after midnight except a sip of water with medications (see medication instructions below)  Medication Instructions: Hold Furosemide the morning of the procedure Hold the Metoprolol the morning of the procedure  Continue your anticoagulant: Eliquis. Do not miss any doses, if you do then please call [phone]. You will need to continue your anticoagulant after your procedure until you  are told by your provider that it is safe to stop   Labs:  Your provider would like for you to return on 10/18/19 to have the following labs drawn: BMET and CBC. You do not need an appointment for the lab. Once in our office lobby there is a podium where you can sign in and ring the doorbell to alert [place] that you are here. The lab is open from 8:00 am to 4:30 pm; closed for lunch from 12:45pm-1:45pm.  You will need to have the coronavirus test completed prior to your procedure. An appointment has been made at 3:05 pm on 10/20/19. This is a Drive Up Visit at the [facility] [address]. Someone will direct you to the appropriate testing line. Please tell them that you are there for procedure testing. Stay in your car and someone will be with you shortly. Please make sure to have all other labs completed before this test because you will need to stay quarantined until your procedure.    You must have a responsible person to drive you home and stay in the waiting area during your procedure. Failure to do so could result in cancellation.  Bring your insurance cards.  *Special Note: Every effort is made to have your procedure done on time.  Occasionally there are emergencies that occur at the hospital that may cause delays. Please be patient if a delay does occur.         Signed, Sanda Klein, MD  10/11/2019 2:38 PM    [address], [place], [place]  [address] Phone: [phone]; Fax: [phone]

## 2019-10-18 ENCOUNTER — Other Ambulatory Visit: Payer: Self-pay | Admitting: Cardiovascular Disease

## 2019-10-19 ENCOUNTER — Other Ambulatory Visit: Payer: Self-pay | Admitting: Cardiovascular Disease

## 2019-10-19 LAB — BASIC METABOLIC PANEL WITH GFR
BUN/Creatinine Ratio: 17 (calc) (ref 6–22)
BUN: 24 mg/dL (ref 7–25)
CO2: 33 mmol/L — ABNORMAL HIGH (ref 20–32)
Calcium: 9.2 mg/dL (ref 8.6–10.3)
Chloride: 102 mmol/L (ref 98–110)
Creat: 1.44 mg/dL — ABNORMAL HIGH (ref 0.70–1.11)
GFR, Est African American: 49 mL/min/{1.73_m2} — ABNORMAL LOW (ref >=60)
GFR, Est Non African American: 42 mL/min/{1.73_m2} — ABNORMAL LOW (ref >=60)
Glucose, Bld: 87 mg/dL (ref 65–99)
Potassium: 4.9 mmol/L (ref 3.5–5.3)
Sodium: 140 mmol/L (ref 135–146)

## 2019-10-19 LAB — CBC WITH DIFFERENTIAL/PLATELET
Absolute Monocytes: 728 cells/uL (ref 200–950)
Basophils Absolute: 53 cells/uL (ref 0–200)
Basophils Relative: 0.7 %
Eosinophils Absolute: 203 cells/uL (ref 15–500)
Eosinophils Relative: 2.7 %
HCT: 43.4 % (ref 38.5–50.0)
Hemoglobin: 13.6 g/dL (ref 13.2–17.1)
Lymphs Abs: 1335 cells/uL (ref 850–3900)
MCH: 28.6 pg (ref 27.0–33.0)
MCHC: 31.3 g/dL — ABNORMAL LOW (ref 32.0–36.0)
MCV: 91.4 fL (ref 80.0–100.0)
MPV: 10.2 fL (ref 7.5–12.5)
Monocytes Relative: 9.7 %
Neutro Abs: 5183 cells/uL (ref 1500–7800)
Neutrophils Relative %: 69.1 %
Platelets: 342 10*3/uL (ref 140–400)
RBC: 4.75 10*6/uL (ref 4.20–5.80)
RDW: 12.9 % (ref 11.0–15.0)
Total Lymphocyte: 17.8 %
WBC: 7.5 10*3/uL (ref 3.8–10.8)

## 2019-10-20 ENCOUNTER — Other Ambulatory Visit (HOSPITAL_COMMUNITY)
Admission: RE | Admit: 2019-10-20 | Discharge: 2019-10-20 | Disposition: A | Discharge status: Home / Self Care | Payer: Medicare HMO | Source: Ambulatory Visit | Attending: Cardiovascular Disease | Admitting: Cardiovascular Disease

## 2019-10-20 DIAGNOSIS — Z20822 Contact with and (suspected) exposure to covid-19: Secondary | ICD-10-CM | POA: Insufficient documentation

## 2019-10-20 DIAGNOSIS — Z01812 Encounter for preprocedural laboratory examination: Secondary | ICD-10-CM | POA: Insufficient documentation

## 2019-10-20 LAB — SARS CORONAVIRUS 2 (TAT 6-24 HRS): SARS Coronavirus 2: NEGATIVE

## 2019-10-22 ENCOUNTER — Other Ambulatory Visit: Payer: Self-pay | Admitting: Cardiovascular Disease

## 2019-10-23 ENCOUNTER — Other Ambulatory Visit: Payer: Self-pay | Admitting: *Deleted

## 2019-10-23 ENCOUNTER — Telehealth: Payer: Self-pay | Admitting: *Deleted

## 2019-10-23 NOTE — Progress Notes (Signed)
Spoke with patients daughter over the phone. Patient takes eliquis. No missed does of this drug. COVID test negative. Instructed for patient to be NPO after midnight and to take any medications with a sip of water. Patient will arrive to hospital at 8:30.

## 2019-10-23 NOTE — Telephone Encounter (Signed)
Spoke with the patient's daughter about the lab work for the cardioversion tomorrow. She stated that she had them done at [facility] and they were going to fax them over. The lab results have not been received yet. Attempted to get in touch with [facility] but have been unable.  Lab work will need to be completed the morning of the procedure.

## 2019-10-24 ENCOUNTER — Other Ambulatory Visit: Payer: Self-pay

## 2019-10-24 ENCOUNTER — Ambulatory Visit (HOSPITAL_COMMUNITY): Payer: Medicare HMO | Admitting: Certified Registered Nurse Anesthetist

## 2019-10-24 ENCOUNTER — Ambulatory Visit (HOSPITAL_COMMUNITY)
Admission: RE | Admit: 2019-10-24 | Discharge: 2019-10-24 | Disposition: A | Discharge status: Home / Self Care | Payer: Medicare HMO | Attending: Cardiovascular Disease | Admitting: Cardiovascular Disease

## 2019-10-24 ENCOUNTER — Telehealth: Payer: Self-pay | Admitting: Cardiovascular Disease

## 2019-10-24 ENCOUNTER — Encounter (HOSPITAL_COMMUNITY)
Admission: RE | Disposition: A | Discharge status: Home / Self Care | Payer: Self-pay | Source: Home / Self Care | Attending: Cardiovascular Disease

## 2019-10-24 ENCOUNTER — Encounter (HOSPITAL_COMMUNITY): Payer: Self-pay | Admitting: Cardiovascular Disease

## 2019-10-24 DIAGNOSIS — I110 Hypertensive heart disease with heart failure: Secondary | ICD-10-CM | POA: Insufficient documentation

## 2019-10-24 DIAGNOSIS — Z79899 Other long term (current) drug therapy: Secondary | ICD-10-CM | POA: Diagnosis not present

## 2019-10-24 DIAGNOSIS — Z79890 Hormone replacement therapy (postmenopausal): Secondary | ICD-10-CM | POA: Insufficient documentation

## 2019-10-24 DIAGNOSIS — K219 Gastro-esophageal reflux disease without esophagitis: Secondary | ICD-10-CM | POA: Insufficient documentation

## 2019-10-24 DIAGNOSIS — I495 Sick sinus syndrome: Secondary | ICD-10-CM | POA: Insufficient documentation

## 2019-10-24 DIAGNOSIS — I484 Atypical atrial flutter: Secondary | ICD-10-CM

## 2019-10-24 DIAGNOSIS — Z87891 Personal history of nicotine dependence: Secondary | ICD-10-CM | POA: Insufficient documentation

## 2019-10-24 DIAGNOSIS — Z7901 Long term (current) use of anticoagulants: Secondary | ICD-10-CM | POA: Insufficient documentation

## 2019-10-24 DIAGNOSIS — I4510 Unspecified right bundle-branch block: Secondary | ICD-10-CM | POA: Insufficient documentation

## 2019-10-24 DIAGNOSIS — E039 Hypothyroidism, unspecified: Secondary | ICD-10-CM | POA: Insufficient documentation

## 2019-10-24 DIAGNOSIS — J449 Chronic obstructive pulmonary disease, unspecified: Secondary | ICD-10-CM | POA: Insufficient documentation

## 2019-10-24 DIAGNOSIS — I4891 Unspecified atrial fibrillation: Secondary | ICD-10-CM | POA: Insufficient documentation

## 2019-10-24 DIAGNOSIS — I5033 Acute on chronic diastolic (congestive) heart failure: Secondary | ICD-10-CM | POA: Insufficient documentation

## 2019-10-24 DIAGNOSIS — Z881 Allergy status to other antibiotic agents status: Secondary | ICD-10-CM | POA: Diagnosis not present

## 2019-10-24 DIAGNOSIS — Z7951 Long term (current) use of inhaled steroids: Secondary | ICD-10-CM | POA: Diagnosis not present

## 2019-10-24 HISTORY — PX: CARDIOVERSION: SHX1299

## 2019-10-24 LAB — POCT I-STAT, CHEM 8
BUN: 31 mg/dL — ABNORMAL HIGH (ref 8–23)
Calcium, Ion: 1.17 mmol/L (ref 1.15–1.40)
Chloride: 100 mmol/L (ref 98–111)
Creatinine, Ser: 1.4 mg/dL — ABNORMAL HIGH (ref 0.61–1.24)
Glucose, Bld: 101 mg/dL — ABNORMAL HIGH (ref 70–99)
HCT: 41 % (ref 39.0–52.0)
Hemoglobin: 13.9 g/dL (ref 13.0–17.0)
Potassium: 4.7 mmol/L (ref 3.5–5.1)
Sodium: 141 mmol/L (ref 135–145)
TCO2: 33 mmol/L — ABNORMAL HIGH (ref 22–32)

## 2019-10-24 SURGERY — CARDIOVERSION
Anesthesia: General

## 2019-10-24 MED ORDER — SODIUM CHLORIDE 0.9 % IV SOLN: INTRAVENOUS | Status: DC | PRN

## 2019-10-24 MED ORDER — PHENYLEPHRINE 40 MCG/ML (10ML) SYRINGE FOR IV PUSH (FOR BLOOD PRESSURE SUPPORT)
PREFILLED_SYRINGE | INTRAVENOUS | Status: DC | PRN
  Administered 2019-10-24: 80 ug via INTRAVENOUS
  Administered 2019-10-24: 40 ug via INTRAVENOUS

## 2019-10-24 MED ORDER — PROPOFOL 10 MG/ML IV BOLUS
INTRAVENOUS | Status: DC | PRN
  Administered 2019-10-24: 40 mg via INTRAVENOUS

## 2019-10-24 MED ORDER — METOPROLOL TARTRATE 50 MG PO TABS: 25.0000 mg | ORAL_TABLET | Freq: Two times a day (BID) | ORAL | 0 refills | Status: DC

## 2019-10-24 MED ORDER — LIDOCAINE 2% (20 MG/ML) 5 ML SYRINGE
INTRAMUSCULAR | Status: DC | PRN
  Administered 2019-10-24: 60 mg via INTRAVENOUS

## 2019-10-24 NOTE — Telephone Encounter (Signed)
Left message to call and schedule f/u with Dr. Loletha Grayer

## 2019-10-24 NOTE — Anesthesia Postprocedure Evaluation (Signed)
Anesthesia Post Note  Patient: Samuel Mcconnell  Procedure(s) Performed: CARDIOVERSION (N/A )     Patient location during evaluation: Endoscopy Anesthesia Type: General Level of consciousness: awake and alert Pain management: pain level controlled Vital Signs Assessment: post-procedure vital signs reviewed and stable Respiratory status: spontaneous breathing, nonlabored ventilation and respiratory function stable Cardiovascular status: blood pressure returned to baseline and stable Postop Assessment: no apparent nausea or vomiting Anesthetic complications: no   No complications documented.  Last Vitals:  Vitals:   10/24/19 0955 10/24/19 1010  BP: (!) 79/42 (!) 93/58  Pulse: (!) 49 (!) 54  Resp: 11 11  Temp:    SpO2: 100% 100%    Last Pain:  Vitals:   10/24/19 1010  TempSrc:   PainSc: 0-No pain                 Lidia Collum

## 2019-10-24 NOTE — Telephone Encounter (Signed)
Follow up   Pt's daughter calling trying to schedule pt for f/u with Dr. Loletha Grayer after cardioversion. No available appt. Spoke with Caryl Pina and she will get in touch with Dr. Loletha Grayer nurse to arrange appt. Hassan Rowan said to call her instead

## 2019-10-24 NOTE — Progress Notes (Signed)
Anesthesia remains at bedside r/t decreased bp, pt arrousable with verbal and/or tactile stimuli, recovery rn present, Dr. Sallyanne Kuster at bedside, safety maintained

## 2019-10-24 NOTE — Anesthesia Preprocedure Evaluation (Signed)
Anesthesia Evaluation  Patient identified by MRN, date of birth, ID band Patient awake    Reviewed: Allergy & Precautions, NPO status , Patient's Chart, lab work & pertinent test results  History of Anesthesia Complications Negative for: history of anesthetic complications  Airway Mallampati: II  TM Distance: >3 FB Neck ROM: Full    Dental   Pulmonary COPD, former smoker,    Pulmonary exam normal        Cardiovascular hypertension, + dysrhythmias Atrial Fibrillation  Rhythm:Irregular Rate:Normal     Neuro/Psych negative neurological ROS  negative psych ROS   GI/Hepatic negative GI ROS, Neg liver ROS,   Endo/Other  negative endocrine ROS  Renal/GU negative Renal ROS  negative genitourinary   Musculoskeletal negative musculoskeletal ROS (+)   Abdominal   Peds  Hematology negative hematology ROS (+)   Anesthesia Other Findings   Reproductive/Obstetrics                             Anesthesia Physical Anesthesia Plan  ASA: III  Anesthesia Plan: General   Post-op Pain Management:    Induction: Intravenous  PONV Risk Score and Plan: 2 and TIVA and Treatment may vary due to age or medical condition  Airway Management Planned: Mask  Additional Equipment: None  Intra-op Plan:   Post-operative Plan:   Informed Consent: I have reviewed the patients History and Physical, chart, labs and discussed the procedure including the risks, benefits and alternatives for the proposed anesthesia with the patient or authorized representative who has indicated his/her understanding and acceptance.       Plan Discussed with:   Anesthesia Plan Comments:         Anesthesia Quick Evaluation

## 2019-10-24 NOTE — Transfer of Care (Signed)
Immediate Anesthesia Transfer of Care Note  Patient: Samuel Mcconnell  Procedure(s) Performed: CARDIOVERSION (N/A )  Patient Location: Endoscopy Unit  Anesthesia Type:MAC  Level of Consciousness: awake, alert  and oriented  Airway & Oxygen Therapy: Patient Spontanous Breathing and Patient connected to nasal cannula oxygen  Post-op Assessment: Report given to RN, Post -op Vital signs reviewed and stable and Patient moving all extremities  Post vital signs: Reviewed and stable  Last Vitals:  Vitals Value Taken Time  BP    Temp    Pulse    Resp    SpO2      Last Pain:  Vitals:   10/24/19 0854  TempSrc: Oral  PainSc: 0-No pain         Complications: No complications documented.

## 2019-10-24 NOTE — Op Note (Signed)
Procedure: Electrical Cardioversion Indications:  Atrial Flutter  Procedure Details:  Consent: Risks of procedure as well as the alternatives and risks of each were explained to the (patient/caregiver).  Consent for procedure obtained.  Time Out: Verified patient identification, verified procedure, site/side was marked, verified correct patient position, special equipment/implants available, medications/allergies/relevent history reviewed, required imaging and test results available.  Performed  Patient placed on cardiac monitor, pulse oximetry, supplemental oxygen as necessary.  Sedation given: propofol 40 mg IV, Dr. Kerin Perna Pacer pads placed anterior and posterior chest.  Cardioverted 1 time(s).  Cardioversion with synchronized biphasic 120J shock.  Evaluation: Findings: Post procedure EKG shows: sinus bradycardia 50 bpm Complications: transient hypotension Patient did tolerate procedure well.  Time Spent Directly with the Patient:  30 minutes   Samuel Mcconnell 10/24/2019, 9:46 AM

## 2019-10-24 NOTE — Interval H&P Note (Signed)
History and Physical Interval Note:  10/24/2019 8:35 AM  Samuel Mcconnell  has presented today for surgery, with the diagnosis of AFIB.  The various methods of treatment have been discussed with the patient and family. After consideration of risks, benefits and other options for treatment, the patient has consented to  Procedure(s): CARDIOVERSION (N/A) as a surgical intervention.  The patient's history has been reviewed, patient examined, no change in status, stable for surgery.  I have reviewed the patient's chart and labs.  Questions were answered to the patient's satisfaction.     Ehab Humber

## 2019-10-24 NOTE — Telephone Encounter (Signed)
Spoke to patient's daughter Hassan Rowan she stated someone left a message father has appointment with Dr.Croitoru 8/27 at 11:40 am.After reviewing appointment screen no appointment has been scheduled then.Advised I will send message to Dr.Croitoru's RN.

## 2019-10-24 NOTE — Discharge Instructions (Signed)
Electrical Cardioversion  Follow these instructions at home:  Do not drive for 24 hours if you were given a sedative during your procedure.  Take over-the-counter and prescription medicines only as told by your health care provider.  Ask your health care provider how to check your pulse. Check it often.  Rest for 48 hours after the procedure or as told by your health care provider.  Avoid or limit your caffeine use as told by your health care provider.  Keep all follow-up visits as told by your health care provider. This is important. Contact a health care provider if:  You feel like your heart is beating too quickly or your pulse is not regular.  You have a serious muscle cramp that does not go away. Get help right away if:  You have discomfort in your chest.  You are dizzy or you feel faint.  You have trouble breathing or you are short of breath.  Your speech is slurred.  You have trouble moving an arm or leg on one side of your body.  Your fingers or toes turn cold or blue. Summary  Electrical cardioversion is the delivery of a jolt of electricity to restore a normal rhythm to the heart.  This procedure may be done right away in an emergency or may be a scheduled procedure if the condition is not an emergency.  Generally, this is a safe procedure.  After the procedure, check your pulse often as told by your health care provider. This information is not intended to replace advice given to you by your health care provider. Make sure you discuss any questions you have with your health care provider. Document Revised: 10/10/2018 Document Reviewed: 10/10/2018 Elsevier Patient Education  2020 Elsevier Inc.  

## 2019-11-17 ENCOUNTER — Ambulatory Visit: Payer: Medicare HMO | Admitting: Cardiovascular Disease

## 2019-11-17 ENCOUNTER — Encounter: Payer: Self-pay | Admitting: Cardiovascular Disease

## 2019-11-17 ENCOUNTER — Other Ambulatory Visit: Payer: Self-pay

## 2019-11-17 VITALS — BP 134/63 | HR 51 | Ht 71.0 in | Wt 183.8 lb

## 2019-11-17 DIAGNOSIS — I5032 Chronic diastolic (congestive) heart failure: Secondary | ICD-10-CM

## 2019-11-17 DIAGNOSIS — I495 Sick sinus syndrome: Secondary | ICD-10-CM

## 2019-11-17 DIAGNOSIS — I484 Atypical atrial flutter: Secondary | ICD-10-CM | POA: Diagnosis not present

## 2019-11-17 DIAGNOSIS — I10 Essential (primary) hypertension: Secondary | ICD-10-CM | POA: Diagnosis not present

## 2019-11-17 DIAGNOSIS — J449 Chronic obstructive pulmonary disease, unspecified: Secondary | ICD-10-CM | POA: Diagnosis not present

## 2019-11-17 DIAGNOSIS — N1831 Chronic kidney disease, stage 3a: Secondary | ICD-10-CM

## 2019-11-17 MED ORDER — AMIODARONE HCL 200 MG PO TABS: 200.0000 mg | ORAL_TABLET | Freq: Every day | ORAL | 11 refills | Status: DC

## 2019-11-17 NOTE — Progress Notes (Signed)
Cardiology Office Note    Date:  11/19/2019   ID:  Samuel Mcconnell, DOB 04/16/1927, MRN [id number]  PCP:  Jamesetta Geralds, MD  Cardiologist:   Sanda Klein, MD   No chief complaint on file.   History of Present Illness:  Samuel Mcconnell is a 86 y.o. male with history of radiofrequency ablation for atrial fibrillation roughly 10 years ago, recently recurrent.  He has a tendency to have sinus bradycardia as well as first-degree AV block and right bundle branch block.  He returns in follow-up roughly 3 weeks after undergoing elective cardioversion for atypical atrial flutter.  He remains in sinus rhythm (bradycardia 51 bpm).  He has not had any symptoms of severe bradycardia and has not experienced near syncope or syncope.  He has not had palpitations.  He notes some improvement in his stamina and shortness of breath since the cardioversion.  However he feels that he is unsteady on his feet and has weak legs.  He really has not fully recovered from his fall.  He has not had any new falls and has not had any other injuries or bleeding problems.  He is compliant with Eliquis.  He denies orthopnea, PND, lower extremity edema, chest pain or any focal neurological complaints.  On June 17 he fell down the basement stairs while carrying heavy equipment.  On arrival to the hospital [facility]) he was found to be in atrial fibrillation.  Looking back his family wonders whether the arrhythmia may have started a month or 2 before, when he started complaining of some fatigue.  He was hospitalized and rate control was achieved on a combination of metoprolol and diltiazem.  Eliquis was started.  He had an echocardiogram that showed normal left ventricular systolic function with mild to moderate tricuspid regurgitation and moderate pulmonary hypertension.    His last echocardiogram in in the [facility] system was performed in 2018 and showed normal left ventricular systolic function and no clear evidence of diastolic  dysfunction (his tissue Doppler velocities were actually probably normal for almost 86 years old).  The left atrium was "moderately dilated" (systolic diameter was only 36 mm, but indexed volume was 28.5 which I believe represents very mild dilation).  There is no serious valvular problem.  He underwent a event monitor as if there were 2018 that showed PACs and PVCs but no atrial fibrillation.  He has hypertension, COPD, treated hypothyroidism and acid reflux. He has a long-standing right bundle branch block.  Past Medical History:  Diagnosis Date  . Atrial fibrillation ([place])    radiofrequency ablation  . Emphysema   . Hypertension   . Thyroid disease     Past Surgical History:  Procedure Laterality Date  . CARDIAC SURGERY    . CARDIOVERSION N/A 10/24/2019   Procedure: CARDIOVERSION;  Surgeon: Sanda Klein, MD;  Location: [facility];  Service: Cardiovascular;  Laterality: N/A;  . [place]  . KNEE ARTHROSCOPY Left 07/2000  . LOOP RECORDER IMPLANT  01/16/2010   Medtronic Reveal XT (Dr. Norlene Duel)   . RADIOFREQUENCY ABLATION  06/13/2009  . SHOULDER ARTHROSCOPY WITH ROTATOR CUFF REPAIR Left 04/1996  . TIBIAL TUBERCLERPLASTY  ~ 10/2010  . TRANSTHORACIC ECHOCARDIOGRAM  11/19/2011   EF=>55%; mild MR, mild mitral annular calcif; mild TR, normal RSVP; AV mildly sclerotic, mild AV regurg; trace pulm valve regurg     Current Medications: Outpatient Medications Prior to Visit  Medication Sig Dispense Refill  . apixaban (ELIQUIS) 5 MG TABS tablet Take 1  tablet (5 mg total) by mouth 2 (two) times daily. 60 tablet 0  . Ascorbic Acid (VITAMIN C) 1000 MG tablet Take 1,000 mg by mouth daily.    . Fluticasone-Salmeterol (WIXELA INHUB) 250-50 MCG/DOSE AEPB Inhale 1 puff into the lungs 2 (two) times daily.    . furosemide (LASIX) 20 MG tablet Take 1 tablet (20 mg total) by mouth daily. 90 tablet 3  . levothyroxine (SYNTHROID) 125 MCG tablet Take 125 mcg by mouth daily before breakfast.      . Multiple Vitamin (MULTIVITAMIN) capsule Take 1 capsule by mouth daily.    . Zinc 50 MG TABS Take 50 mg by mouth.    Marland Kitchen amiodarone (PACERONE) 200 MG tablet TAKE TWO TABLETS BY MOUTH TWICE DAILY FOR THE FIRST WEEK, THEN TAKE TWO TABLETS ONCE DAILY 60 tablet 0  . metoprolol tartrate (LOPRESSOR) 50 MG tablet Take 0.5 tablets (25 mg total) by mouth 2 (two) times daily. 60 tablet 0   No facility-administered medications prior to visit.     Allergies:   Clindamycin/lincomycin, Dabigatran etexilate mesylate, and Doxycycline   Social History   Socioeconomic History  . Marital status: Married    Spouse name: Not on file  . Number of children: 2  . Years of education: Not on file  . Highest education level: Not on file  Occupational History  . Not on file  Tobacco Use  . Smoking status: Former Smoker    Years: 46.00    Quit date: 03/14/2002    Years since quitting: 17.6  . Smokeless tobacco: Never Used  . Tobacco comment: 40 pack year history   Vaping Use  . Vaping Use: Never used  Substance and Sexual Activity  . Alcohol use: No  . Drug use: No  . Sexual activity: Not on file  Other Topics Concern  . Not on file  Social History Narrative  . Not on file   Social Determinants of Health   Financial Resource Strain:   . Difficulty of Paying Living Expenses: Not on file  Food Insecurity:   . Worried About Charity fundraiser in the Last Year: Not on file  . Ran Out of Food in the Last Year: Not on file  Transportation Needs:   . Lack of Transportation (Medical): Not on file  . Lack of Transportation (Non-Medical): Not on file  Physical Activity:   . Days of Exercise per Week: Not on file  . Minutes of Exercise per Session: Not on file  Stress:   . Feeling of Stress : Not on file  Social Connections:   . Frequency of Communication with Friends and Family: Not on file  . Frequency of Social Gatherings with Friends and Family: Not on file  . Attends Religious Services: Not on  file  . Active Member of Clubs or Organizations: Not on file  . Attends Archivist Meetings: Not on file  . Marital Status: Not on file     Family History:  The patient's family history includes Atrial fibrillation in his brother, brother, and sister; Cancer in his maternal grandmother; Diabetes in his sister and son; Heart Problems in his sister; Heart disease in his maternal grandfather; Heart failure in his brother, father, mother, and sister; Hypertension in his sister; Pancreatitis in his son.   ROS:   Please see the history of present illness.    ROS All other systems are reviewed and are negative.   PHYSICAL EXAM:   VS:  BP 134/63  Pulse (!) 51   Ht 5\' 11"  (1.803 m)   Wt 183 lb 12.8 oz (83.4 kg)   SpO2 95%   BMI 25.63 kg/m      General: Alert, oriented x3, no distress, he appears well Head: no evidence of trauma, PERRL, EOMI, no exophtalmos or lid lag, no myxedema, no xanthelasma; normal ears, nose and oropharynx Neck: normal jugular venous pulsations and no hepatojugular reflux; brisk carotid pulses without delay and no carotid bruits Chest: clear to auscultation, no signs of consolidation by percussion or palpation, normal fremitus, symmetrical and full respiratory excursions Cardiovascular: normal position and quality of the apical impulse, regular rhythm, normal first and widely split second heart sounds, no murmurs, rubs or gallops Abdomen: no tenderness or distention, no masses by palpation, no abnormal pulsatility or arterial bruits, normal bowel sounds, no hepatosplenomegaly Extremities: no clubbing, cyanosis or edema; 2+ radial, ulnar and brachial pulses bilaterally; 2+ right femoral, posterior tibial and dorsalis pedis pulses; 2+ left femoral, posterior tibial and dorsalis pedis pulses; no subclavian or femoral bruits Neurological: grossly nonfocal Psych: Normal mood and affect   Wt Readings from Last 3 Encounters:  11/17/19 183 lb 12.8 oz (83.4 kg)   10/11/19 189 lb 12.8 oz (86.1 kg)  10/02/19 191 lb 3.2 oz (86.7 kg)      Studies/Labs Reviewed:   ECHO [facility]  Result Date: 09/10/2019 Left Ventricle: Normal left ventricle. Wall thickness is normal. Systolic function is normal. EF: 55-60%. Wall motion cannot be accurately assessed. Doppler parameters are indeterminate for diastolic function. . Right Ventricle: Right ventricle is mildly dilated. Abnormal tricuspid annular plane systolic excursion (TAPSE) <1.7 cm. . Right Atrium: Right atrium is mildly dilated. . Tricuspid Valve: Tricuspid valve structure is normal. There is mild to moderate regurgitation. The right ventricular systolic pressure is moderately elevated (50-59 mmHg).   [facility] CT 09/07/2019  IMPRESSION:  1. No rib fracture or pneumothorax identified.  2. Stable atherosclerosis.  3. No definite soft tissue organ injury identified.  4. No significant change hypodense focus left kidney.  5. No free air or fluid identified.    EKG:  EKG is ordered today.  It shows sinus bradycardia at 51 bpm, first-degree AV block, right bundle branch block.  The QTC is 499 ms.  Recent Labs: 08/28/2019 TSH 3.53 Hemoglobin 13.5  BMET    Component Value Date/Time   NA 141 10/24/2019 0858   NA 140 09/28/2019 1219   K 4.7 10/24/2019 0858   CL 100 10/24/2019 0858   CO2 33 (H) 10/18/2019 0951   GLUCOSE 101 (H) 10/24/2019 0858   BUN 31 (H) 10/24/2019 0858   BUN 29 09/28/2019 1219   CREATININE 1.40 (H) 10/24/2019 0858   CREATININE 1.44 (H) 10/18/2019 0951   CALCIUM 9.2 10/18/2019 0951   GFRNONAA 42 (L) 10/18/2019 0951   GFRAA 49 (L) 10/18/2019 0951     Lipid Panel 08/28/2019 The request for 160, triglycerides 79, HDL 41, LDL 104  Lipid Panel  No results found for: CHOL, TRIG, HDL, CHOLHDL, VLDL, LDLCALC, LDLDIRECT, LABVLDL    Additional studies/ records that were reviewed today include:  Notes from hospitalization at [facility]. Spirometry 05/16/2019 showing FEV1 1.77 L  (68%), FVC 2.51 L) 70%), mild restrictive ventilatory impairment unchanged  ASSESSMENT:    1. Atypical atrial flutter ([place])   2. Tachycardia-bradycardia syndrome ([place])   3. Chronic diastolic heart failure ([place])   4. Chronic obstructive pulmonary disease, unspecified COPD type ([place])   5. Essential hypertension   6.  Stage 3a chronic kidney disease      PLAN:  In order of problems listed above:  1. AFib/atypical atrial flutter: Maintaining normal rhythm.  We will stop the beta-blocker due to bradycardia and reduce the amiodarone to maintenance dose of 200 mg twice daily. He is on appropriate anticoagulation and has not had any bleeding problems.CHADSVasc at least 4 (age 89, HTN, CHF). 2. Tachybradycardia syndrome:   It is still possible that he may require pacemaker in the future, as the amiodarone reaches steady state over the next couple of months.  3. CHF: Edema has resolved and it seems that he has had some improvement in exercise ability since returning to normal rhythm normal left ventricular systolic function on echo, but there was some evidence of right ventricular dysfunction, possibly due to his lung disease. 4. COPD:  on long-acting bronchodilators.  Will be stopping his beta-blocker altogether.  (Note that in the past he tolerated bisoprolol better than metoprolol). 5. HTN: Blood pressure is in acceptable range.  If he requires antihypertensive therapy after stopping the metoprolol, would probably use an ARB. 6. CKD 3a: Baseline GFR seems to be around 50, GFR 1.2-1.4.   Medication Adjustments/Labs and Tests Ordered: Current medicines are reviewed at length with the patient today.  Concerns regarding medicines are outlined above.  Medication changes, Labs and Tests ordered today are listed in the Patient Instructions below. Patient Instructions  Medication Instructions:  DECREASE Amiodarone to 200 mg once daily STOP the Metoprolol   *If you need a refill on your cardiac  medications before your next appointment, please call your pharmacy*   Lab Work: None ordered If you have labs (blood work) drawn today and your tests are completely normal, you will receive your results only by: Marland Kitchen MyChart Message (if you have MyChart) OR . A paper copy in the mail If you have any lab test that is abnormal or we need to change your treatment, we will call you to review the results.   Testing/Procedures: None ordered   Follow-Up: At [facility], you and your health needs are our priority.  As part of our continuing mission to provide you with exceptional heart care, we have created designated Provider Care Teams.  These Care Teams include your primary Cardiologist (physician) and Advanced Practice Providers (APPs -  Physician Assistants and Nurse Practitioners) who all work together to provide you with the care you need, when you need it.  We recommend signing up for the patient portal called "MyChart".  Sign up information is provided on this After Visit Summary.  MyChart is used to connect with patients for Virtual Visits (Telemedicine).  Patients are able to view lab/test results, encounter notes, upcoming appointments, etc.  Non-urgent messages can be sent to your provider as well.   To learn more about what you can do with MyChart, go to [email or web address].    Your next appointment:   12 months  The format for your next appointment:   In Person  Provider:   You may see Sanda Klein, MD or one of the following Advanced Practice Providers on your designated Care Team:    Almyra Deforest, PA-C  Fabian Sharp, [place] or   Roby Lofts, PA-C       Signed, Sanda Klein, MD  11/19/2019 3:09 PM    [address], [place], [place]  [address] Phone: [phone]; Fax: [phone]

## 2019-11-17 NOTE — Patient Instructions (Signed)
Medication Instructions:  DECREASE Amiodarone to 200 mg once daily STOP the Metoprolol   *If you need a refill on your cardiac medications before your next appointment, please call your pharmacy*   Lab Work: None ordered If you have labs (blood work) drawn today and your tests are completely normal, you will receive your results only by: Marland Kitchen MyChart Message (if you have MyChart) OR . A paper copy in the mail If you have any lab test that is abnormal or we need to change your treatment, we will call you to review the results.   Testing/Procedures: None ordered   Follow-Up: At [facility], you and your health needs are our priority.  As part of our continuing mission to provide you with exceptional heart care, we have created designated Provider Care Teams.  These Care Teams include your primary Cardiologist (physician) and Advanced Practice Providers (APPs -  Physician Assistants and Nurse Practitioners) who all work together to provide you with the care you need, when you need it.  We recommend signing up for the patient portal called "MyChart".  Sign up information is provided on this After Visit Summary.  MyChart is used to connect with patients for Virtual Visits (Telemedicine).  Patients are able to view lab/test results, encounter notes, upcoming appointments, etc.  Non-urgent messages can be sent to your provider as well.   To learn more about what you can do with MyChart, go to [email or web address].    Your next appointment:   12 months  The format for your next appointment:   In Person  Provider:   You may see Sanda Klein, MD or one of the following Advanced Practice Providers on your designated Care Team:    Almyra Deforest, PA-C  Fabian Sharp, PA-C or   Roby Lofts, [place]

## 2019-11-19 ENCOUNTER — Encounter: Payer: Self-pay | Admitting: Cardiovascular Disease

## 2019-11-22 ENCOUNTER — Encounter: Payer: Self-pay | Admitting: Family Medicine

## 2019-11-22 ENCOUNTER — Ambulatory Visit (INDEPENDENT_AMBULATORY_CARE_PROVIDER_SITE_OTHER): Payer: Medicare HMO | Admitting: Family Medicine

## 2019-11-22 VITALS — BP 131/66 | HR 70 | Temp 98.1°F | Wt 186.1 lb

## 2019-11-22 DIAGNOSIS — R2681 Unsteadiness on feet: Secondary | ICD-10-CM | POA: Diagnosis not present

## 2019-11-22 DIAGNOSIS — I480 Paroxysmal atrial fibrillation: Secondary | ICD-10-CM

## 2019-11-22 DIAGNOSIS — I10 Essential (primary) hypertension: Secondary | ICD-10-CM

## 2019-11-22 DIAGNOSIS — E039 Hypothyroidism, unspecified: Secondary | ICD-10-CM

## 2019-11-22 DIAGNOSIS — J449 Chronic obstructive pulmonary disease, unspecified: Secondary | ICD-10-CM | POA: Diagnosis not present

## 2019-11-22 MED ORDER — AMBULATORY NON FORMULARY MEDICATION: 0 refills | Status: DC

## 2019-11-22 MED ORDER — FLUTICASONE PROPIONATE 50 MCG/ACT NA SUSP
2.0000 | Freq: Every day | NASAL | 6 refills | Status: DC
Start: 2019-11-22 — End: 2019-11-22

## 2019-11-22 NOTE — Patient Instructions (Signed)
Great to meet you today! Continue current medications.  Try Rollator walker to help with balance and instability.  See me again in 3-4 months.

## 2019-11-26 ENCOUNTER — Encounter: Payer: Self-pay | Admitting: Family Medicine

## 2019-11-26 DIAGNOSIS — R2681 Unsteadiness on feet: Secondary | ICD-10-CM | POA: Insufficient documentation

## 2019-11-26 DIAGNOSIS — E039 Hypothyroidism, unspecified: Secondary | ICD-10-CM | POA: Insufficient documentation

## 2019-11-26 NOTE — Assessment & Plan Note (Addendum)
Recent cardioversion.  Continues on amiodarone.  Off of diltiazem and metoprolol at this point due to bradycardia.  Previous cardiology notes reviewed and may need pacemaker in the future.  He will continue on eliquis for CHADS score of 4.

## 2019-11-26 NOTE — Assessment & Plan Note (Signed)
>>  ASSESSMENT AND PLAN FOR PAF (PAROXYSMAL ATRIAL FIBRILLATION) (HCC) WRITTEN ON 11/26/2019  9:23 PM BY MATTHEWS, CODY, DO  Recent cardioversion.  Continues on amiodarone .  Off of diltiazem  and metoprolol  at this point due to bradycardia.  Previous cardiology notes reviewed and may need pacemaker in the future.  He will continue on eliquis  for CHADS score of 4.

## 2019-11-26 NOTE — Assessment & Plan Note (Signed)
BP is well controlled at this time.  Continue current medication.

## 2019-11-26 NOTE — Progress Notes (Signed)
Samuel Mcconnell - 86 y.o. male MRN [id number]  Date of birth: [birth year]  Subjective Chief Complaint  Patient presents with  . Establish Care    HPI Samuel Mcconnell is a 86 y.o. male here today for initial visit.  He has a history of PAF, HTN, COPD, hypothyroidism, arthritis and gait instability.  He is accompanied to visit today with his daughter.    He had fall down basement stairs in June.  Found to be in A. Fib in hospital.  Had been complaining of fatigue for a couple of months prior to fall, indicating he may have been in a. Fib prior to fall.  He was hospitalized briefly and rate control achieved with metoprolol and diltiazem and is anticoagulated with eliquis.  He did have metoprolol decreased due to hypotension at cardiology follow up.  Diltiazem was also discontinued and Amiodarone was added at this appointment.  He undewent cardioversion with Dr. Sallyanne Kuster on 8/3.  He has remained in sinus rhythm since this time.   Metoprolol has been discontinued due to continued bradycardia.  He continues on amiodarone.   He has had some gait instability but denies any other symptoms at this time.  His fatigue has improved.  For his COPD he is followed by [facility]. Current management with Wixela inahler daily and albuterol as needed.  Symptoms are pretty well controlled at this time.     He is currently using a cane for gait instability.  He still feels unsteady with this.   He does also have a walker at home but doesn't use it because it is hard to navigate.  He feels like a walker with wheels would be easier for him to use.  He is planning on moving to [place] with his wife soon and will start doing PT there.    ROS:  A comprehensive ROS was completed and negative except as noted per HPI  Allergies  Allergen Reactions  . Clindamycin/Lincomycin Other (See Comments)    Pt felt weird    . Dabigatran Etexilate Mesylate Other (See Comments)    "felt weird"  . Doxycycline Other (See  Comments)    Dyspepsia    Past Medical History:  Diagnosis Date  . Atrial fibrillation ([place])    radiofrequency ablation  . Emphysema   . Hypertension   . Thyroid disease     Past Surgical History:  Procedure Laterality Date  . CARDIAC SURGERY    . CARDIOVERSION N/A 10/24/2019   Procedure: CARDIOVERSION;  Surgeon: Sanda Klein, MD;  Location: [facility];  Service: Cardiovascular;  Laterality: N/A;  . [place]  . KNEE ARTHROSCOPY Left 07/2000  . LOOP RECORDER IMPLANT  01/16/2010   Medtronic Reveal XT (Dr. Norlene Duel)   . RADIOFREQUENCY ABLATION  06/13/2009  . SHOULDER ARTHROSCOPY WITH ROTATOR CUFF REPAIR Left 04/1996  . TIBIAL TUBERCLERPLASTY  ~ 10/2010  . TRANSTHORACIC ECHOCARDIOGRAM  11/19/2011   EF=>55%; mild MR, mild mitral annular calcif; mild TR, normal RSVP; AV mildly sclerotic, mild AV regurg; trace pulm valve regurg     Social History   Socioeconomic History  . Marital status: Married    Spouse name: Not on file  . Number of children: 2  . Years of education: Not on file  . Highest education level: Not on file  Occupational History  . Occupation: Retired  Tobacco Use  . Smoking status: Former Smoker    Years: 46.00    Quit date: 03/14/2002    Years  since quitting: 17.7  . Smokeless tobacco: Never Used  . Tobacco comment: 40 pack year history   Vaping Use  . Vaping Use: Never used  Substance and Sexual Activity  . Alcohol use: No  . Drug use: No  . Sexual activity: Not Currently    Partners: Female  Other Topics Concern  . Not on file  Social History Narrative  . Not on file   Social Determinants of Health   Financial Resource Strain:   . Difficulty of Paying Living Expenses: Not on file  Food Insecurity:   . Worried About Charity fundraiser in the Last Year: Not on file  . Ran Out of Food in the Last Year: Not on file  Transportation Needs:   . Lack of Transportation (Medical): Not on file  . Lack of Transportation (Non-Medical): Not  on file  Physical Activity:   . Days of Exercise per Week: Not on file  . Minutes of Exercise per Session: Not on file  Stress:   . Feeling of Stress : Not on file  Social Connections:   . Frequency of Communication with Friends and Family: Not on file  . Frequency of Social Gatherings with Friends and Family: Not on file  . Attends Religious Services: Not on file  . Active Member of Clubs or Organizations: Not on file  . Attends Archivist Meetings: Not on file  . Marital Status: Not on file    Family History  Problem Relation Age of Onset  . Heart failure Father        MI - died @ 70  . Heart failure Mother   . Diabetes Son   . Pancreatitis Son        also ETOH abuse   . Cancer Maternal Grandmother   . Heart disease Maternal Grandfather   . Atrial fibrillation Brother        dx'ed age 66  . Atrial fibrillation Brother        dx'ed age 84  . Heart failure Brother   . Atrial fibrillation Sister   . Hypertension Sister   . Heart Problems Sister   . Diabetes Sister   . Heart failure Sister     Health Maintenance  Topic Date Due  . INFLUENZA VACCINE  10/22/2019  . TETANUS/TDAP  09/06/2029  . COVID-19 Vaccine  Completed  . PNA vac Low Risk Adult  Completed     ----------------------------------------------------------------------------------------------------------------------------------------------------------------------------------------------------------------- Physical Exam BP 131/66 (BP Location: Left Arm, Patient Position: Sitting, Cuff Size: Normal)   Pulse 70   Temp 98.1 F (36.7 C) (Oral)   Wt 186 lb 1.9 oz (84.4 kg)   SpO2 94%   BMI 25.96 kg/m   Physical Exam Constitutional:      Appearance: Normal appearance.  HENT:     Head: Normocephalic and atraumatic.  Eyes:     General: No scleral icterus. Cardiovascular:     Rate and Rhythm: Regular rhythm. Bradycardia present.  Pulmonary:     Effort: Pulmonary effort is normal.     Breath  sounds: Normal breath sounds.  Musculoskeletal:     Cervical back: Neck supple.  Skin:    General: Skin is warm and dry.  Neurological:     General: No focal deficit present.     Mental Status: He is alert.  Psychiatric:        Mood and Affect: Mood normal.        Behavior: Behavior normal.     -------------------------------------------------------------------------------------------------------------------------------------------------------------------------------------------------------------------  Assessment and Plan  PAF (paroxysmal atrial fibrillation) Recent cardioversion.  Continues on amiodarone.  Off of diltiazem and metoprolol at this point due to bradycardia.  Previous cardiology notes reviewed and may need pacemaker in the future.  He will continue on eliquis for CHADS score of 4.    Essential hypertension BP is well controlled at this time.  Continue current medication.    Chronic obstructive pulmonary disease ([place]) Followed by [facility].  Stable with current inhalers.    Gait instability He plans to start PT once he gets to [place].  Rx for rollator walker written to help with gait instability.   Hypothyroid TSH 3.5 in 08/2019.  Stable at this time.     Meds ordered this encounter  Medications  . AMBULATORY NON FORMULARY MEDICATION    Sig: Please provide Rollator walker for Dx: gait instability R26.81    Dispense:  1 Device    Refill:  0  . DISCONTD: fluticasone (FLONASE) 50 MCG/ACT nasal spray    Sig: Place 2 sprays into both nostrils daily.    Dispense:  16 g    Refill:  6    Return in about 4 months (around 03/23/2020) for Hypothyroid/Gait instability/A. fib.    This visit occurred during the SARS-CoV-2 public health emergency.  Safety protocols were in place, including screening questions prior to the visit, additional usage of staff PPE, and extensive cleaning of exam room while observing appropriate contact time as indicated for  disinfecting solutions.

## 2019-11-26 NOTE — Assessment & Plan Note (Signed)
He plans to start PT once he gets to [place] [facility].  Rx for rollator walker written to help with gait instability.

## 2019-11-26 NOTE — Assessment & Plan Note (Signed)
TSH 3.5 in 08/2019.  Stable at this time.

## 2019-11-26 NOTE — Assessment & Plan Note (Signed)
Followed by [facility].  Stable with current inhalers.

## 2019-11-29 ENCOUNTER — Telehealth: Payer: Self-pay | Admitting: Cardiovascular Disease

## 2019-11-29 MED ORDER — LOSARTAN POTASSIUM 25 MG PO TABS
25.0000 mg | ORAL_TABLET | Freq: Every day | ORAL | 3 refills | Status: DC
Start: 2019-11-29 — End: 2020-01-19

## 2019-11-29 NOTE — Addendum Note (Signed)
Addended by: Ricci Barker on: 11/29/2019 04:30 PM   Modules accepted: Orders

## 2019-11-29 NOTE — Telephone Encounter (Signed)
Spoke with pt's daughter and pt's B/P has been fluctuating ranging 129-160/65-88 HR in the 70's Also had 1 reading last night of 218/67 Pt is in the process of moving to an assisted living facility and notes some stressors at this time Encouraged to continue to monitor and will forward to Dr Sallyanne Kuster for review Last office visit Metoprolol was stopped and was mentioned may have to start ARB if B/P creeps up  .Adonis Housekeeper

## 2019-11-29 NOTE — Telephone Encounter (Signed)
Pt c/o BP issue: STAT if pt c/o blurred vision, one-sided weakness or slurred speech  1. What are your last 5 BP readings? 160/88 HR 74  2. Are you having any other symptoms (ex. Dizziness, headache, blurred vision, passed out)? no  3. What is your BP issue? Patient's daughter states the patient's BP and HR was gradually gone up. She would like to know if he should start the metoprolol again.

## 2019-11-29 NOTE — Telephone Encounter (Signed)
The patient's daughter has been made aware.  A prescription has been sent into the pharmacy.

## 2019-11-29 NOTE — Telephone Encounter (Signed)
Please start losartan 25 mg daily. Report BP back in a week please.

## 2019-12-12 ENCOUNTER — Other Ambulatory Visit: Payer: Self-pay | Admitting: Cardiovascular Disease

## 2020-01-16 ENCOUNTER — Telehealth: Payer: Self-pay | Admitting: Cardiovascular Disease

## 2020-01-16 ENCOUNTER — Ambulatory Visit: Payer: Medicare HMO | Admitting: Family Medicine

## 2020-01-16 DIAGNOSIS — I480 Paroxysmal atrial fibrillation: Secondary | ICD-10-CM

## 2020-01-16 NOTE — Telephone Encounter (Signed)
Called pt's daughter back to confirm directions for holding medications and to give instructions for signing in for blood work only at our office. Daughter instructed to reach out to our office tomorrow if patient is still not improving. Daughter verbalizes understanding. States she will bring the pt in later this afternoon for blood work.

## 2020-01-16 NOTE — Telephone Encounter (Signed)
Patient's daughter called in to ask what should be done regarding the symptoms below - patient had a cardioversion in August. She is not currently physically with the patient but will be soon. Please call/advise.   STAT if patient feels like he/she is going to faint   1) Are you dizzy now?  Yes  2) Do you feel faint or have you passed out?  Is lightheaded and dizzy, does not feel like he's going to pass out though, has not passed out  3) Do you have any other symptoms?  abdominal pain, heart rate up to 101   4) Have you checked your HR and BP (record if available)? HR: 101

## 2020-01-16 NOTE — Telephone Encounter (Signed)
Spoke to pt's daughter and then called pt directly. Pt states he took a shower this morning after stepping out of shower his legs "gave out" on him but did not fall. Sat down in in the floor and could not get up for 15-20 minutes. Was able to make it to the bed and lie down. Feels heart fluttering. BP 81/40 and heart rate in 120s. Reports dizziness, but no loss of consciousness. Denies chest pain now, but reports substernal chest pressure and right-sided back pain over the last few days. Has taken amiodarone 200 mg this morning and losartan 25 mg. Dr. Sallyanne Kuster consulted in office and wants to hold lasix 40 mg and order BMET and CBC. Orders placed and communicated to pt's daughter.   Blood pressure retaken at 97/67. HR down to 111. Per daughter, radial pulse is "skipping beats." Pt reported to be "a little pale." Daughter is unsure of patient's hydration status. Communicated Dr. Victorino December advice to hydrate patient.

## 2020-01-17 ENCOUNTER — Telehealth: Payer: Self-pay

## 2020-01-17 LAB — CBC
Hematocrit: 42.9 % (ref 37.5–51.0)
Hemoglobin: 13.8 g/dL (ref 13.0–17.7)
MCH: 28.7 pg (ref 26.6–33.0)
MCHC: 32.2 g/dL (ref 31.5–35.7)
MCV: 89 fL (ref 79–97)
Platelets: 310 10*3/uL (ref 150–450)
RBC: 4.81 x10E6/uL (ref 4.14–5.80)
RDW: 14.6 % (ref 11.6–15.4)
WBC: 8.8 10*3/uL (ref 3.4–10.8)

## 2020-01-17 LAB — BASIC METABOLIC PANEL
BUN/Creatinine Ratio: 18 (ref 10–24)
BUN: 24 mg/dL (ref 10–36)
CO2: 25 mmol/L (ref 20–29)
Calcium: 10.2 mg/dL (ref 8.6–10.2)
Chloride: 99 mmol/L (ref 96–106)
Creatinine, Ser: 1.37 mg/dL — ABNORMAL HIGH (ref 0.76–1.27)
GFR calc Af Amer: 52 mL/min/{1.73_m2} — ABNORMAL LOW (ref >59)
GFR calc non Af Amer: 45 mL/min/{1.73_m2} — ABNORMAL LOW (ref >59)
Glucose: 110 mg/dL — ABNORMAL HIGH (ref 65–99)
Potassium: 5.6 mmol/L — ABNORMAL HIGH (ref 3.5–5.2)
Sodium: 139 mmol/L (ref 134–144)

## 2020-01-17 NOTE — Telephone Encounter (Signed)
Spoke with patient regarding recent blood work results. Patient states that he feels better today and states that his heart rate went back into rhythm around noon today. Patient states that his Heart rate is currently 70 and blood pressure is currently 120/62. Advised patient of following regarding patients blood work.    Samuel Klein, MD  01/17/2020 1:44 PM EDT     All labs okay scepter borderline high potassium. No sign of bleeding. Kidney function mildly abnormal, at baseline.  Please stay off losartan and do not take furosemide daily. Take furosemide only as needed for edema.   Patient verbalized understanding. Advised patient I would forward message to Dr. Sallyanne Kuster to make him aware.

## 2020-01-19 ENCOUNTER — Other Ambulatory Visit: Payer: Self-pay | Admitting: *Deleted

## 2020-01-19 MED ORDER — FUROSEMIDE 20 MG PO TABS: 20.0000 mg | ORAL_TABLET | ORAL | 3 refills | Status: DC | PRN

## 2020-01-22 ENCOUNTER — Encounter: Payer: Self-pay | Admitting: Family Medicine

## 2020-01-22 ENCOUNTER — Ambulatory Visit: Payer: Medicare HMO

## 2020-01-22 ENCOUNTER — Ambulatory Visit (INDEPENDENT_AMBULATORY_CARE_PROVIDER_SITE_OTHER): Payer: Medicare HMO | Admitting: Family Medicine

## 2020-01-22 VITALS — BP 149/64 | HR 68 | Wt 186.6 lb

## 2020-01-22 DIAGNOSIS — I480 Paroxysmal atrial fibrillation: Secondary | ICD-10-CM

## 2020-01-22 DIAGNOSIS — I10 Essential (primary) hypertension: Secondary | ICD-10-CM | POA: Diagnosis not present

## 2020-01-22 DIAGNOSIS — E039 Hypothyroidism, unspecified: Secondary | ICD-10-CM

## 2020-01-22 DIAGNOSIS — Z23 Encounter for immunization: Secondary | ICD-10-CM

## 2020-01-22 DIAGNOSIS — J449 Chronic obstructive pulmonary disease, unspecified: Secondary | ICD-10-CM

## 2020-01-22 MED ORDER — FLUTICASONE-SALMETEROL 250-50 MCG/DOSE IN AEPB: 1.0000 | INHALATION_SPRAY | Freq: Two times a day (BID) | RESPIRATORY_TRACT | 3 refills | Status: DC

## 2020-01-22 MED ORDER — LEVOTHYROXINE SODIUM 125 MCG PO TABS: 125.0000 ug | ORAL_TABLET | Freq: Every day | ORAL | 3 refills | Status: DC

## 2020-01-22 NOTE — Assessment & Plan Note (Signed)
Rhythm controlled with amiodarone, s/p RF ablation.

## 2020-01-22 NOTE — Progress Notes (Signed)
[name] - 86 y.o. male MRN [id number]  Date of birth: Mar 29, 1927  Subjective Chief Complaint  Patient presents with  . Fall    HPI Samuel Mcconnell is a 86 y.o. male here today for follow up visit.  He reports that he is doing well overall.  He has moved into [place].  Unfortunately he reports that he had an episode of A. Fib and hypotension that resulted in a fall.  He converted back to NSR spontaneously and has felt well since then.  He did have f/u labs with Dr. Sallyanne Kuster which were normal.   BP has remained stable since that time.  He denies dizziness.  He is using a cane for gait stability as needed.   Levothyroxine dose has been stable.  Last labs checked in 08/2019 through [facility].    ROS:  A comprehensive ROS was completed and negative except as noted per HPI  Allergies  Allergen Reactions  . Clindamycin/Lincomycin Other (See Comments)    Pt felt weird    . Dabigatran Etexilate Mesylate Other (See Comments)    "felt weird"  . Doxycycline Other (See Comments)    Dyspepsia    Past Medical History:  Diagnosis Date  . Atrial fibrillation ([place])    radiofrequency ablation  . Emphysema   . Hypertension   . Thyroid disease     Past Surgical History:  Procedure Laterality Date  . CARDIAC SURGERY    . CARDIOVERSION N/A 10/24/2019   Procedure: CARDIOVERSION;  Surgeon: Sanda Klein, MD;  Location: [facility];  Service: Cardiovascular;  Laterality: N/A;  . [place]  . KNEE ARTHROSCOPY Left 07/2000  . LOOP RECORDER IMPLANT  01/16/2010   Medtronic Reveal XT (Dr. Norlene Duel)   . RADIOFREQUENCY ABLATION  06/13/2009  . SHOULDER ARTHROSCOPY WITH ROTATOR CUFF REPAIR Left 04/1996  . TIBIAL TUBERCLERPLASTY  ~ 10/2010  . TRANSTHORACIC ECHOCARDIOGRAM  11/19/2011   EF=>55%; mild MR, mild mitral annular calcif; mild TR, normal RSVP; AV mildly sclerotic, mild AV regurg; trace pulm valve regurg     Social History   Socioeconomic History  . Marital status: Married     Spouse name: Not on file  . Number of children: 2  . Years of education: Not on file  . Highest education level: Not on file  Occupational History  . Occupation: Retired  Tobacco Use  . Smoking status: Former Smoker    Years: 46.00    Quit date: 03/14/2002    Years since quitting: 17.8  . Smokeless tobacco: Never Used  . Tobacco comment: 40 pack year history   Vaping Use  . Vaping Use: Never used  Substance and Sexual Activity  . Alcohol use: No  . Drug use: No  . Sexual activity: Not Currently    Partners: Female  Other Topics Concern  . Not on file  Social History Narrative  . Not on file   Social Determinants of Health   Financial Resource Strain:   . Difficulty of Paying Living Expenses: Not on file  Food Insecurity:   . Worried About Charity fundraiser in the Last Year: Not on file  . Ran Out of Food in the Last Year: Not on file  Transportation Needs:   . Lack of Transportation (Medical): Not on file  . Lack of Transportation (Non-Medical): Not on file  Physical Activity:   . Days of Exercise per Week: Not on file  . Minutes of Exercise per Session: Not on file  Stress:   . Feeling of Stress : Not on file  Social Connections:   . Frequency of Communication with Friends and Family: Not on file  . Frequency of Social Gatherings with Friends and Family: Not on file  . Attends Religious Services: Not on file  . Active Member of Clubs or Organizations: Not on file  . Attends Archivist Meetings: Not on file  . Marital Status: Not on file    Family History  Problem Relation Age of Onset  . Heart failure Father        MI - died @ 24  . Heart failure Mother   . Diabetes Son   . Pancreatitis Son        also ETOH abuse   . Cancer Maternal Grandmother   . Heart disease Maternal Grandfather   . Atrial fibrillation Brother        dx'ed age 58  . Atrial fibrillation Brother        dx'ed age 72  . Heart failure Brother   . Atrial fibrillation Sister    . Hypertension Sister   . Heart Problems Sister   . Diabetes Sister   . Heart failure Sister     Health Maintenance  Topic Date Due  . Samul Dada  09/06/2029  . INFLUENZA VACCINE  Completed  . COVID-19 Vaccine  Completed  . PNA vac Low Risk Adult  Completed     ----------------------------------------------------------------------------------------------------------------------------------------------------------------------------------------------------------------- Physical Exam BP (!) 149/64 (BP Location: Right Arm, Patient Position: Sitting, Cuff Size: Normal)   Pulse 68   Wt 186 lb 9.6 oz (84.6 kg)   SpO2 93%   BMI 26.03 kg/m   Physical Exam Constitutional:      Appearance: Normal appearance.  HENT:     Head: Normocephalic and atraumatic.  Eyes:     General: No scleral icterus. Cardiovascular:     Rate and Rhythm: Normal rate and regular rhythm.  Pulmonary:     Effort: Pulmonary effort is normal.     Breath sounds: Normal breath sounds.  Musculoskeletal:     Cervical back: Neck supple.  Skin:    General: Skin is warm and dry.  Neurological:     General: No focal deficit present.     Mental Status: He is alert.  Psychiatric:        Mood and Affect: Mood normal.        Behavior: Behavior normal.     ------------------------------------------------------------------------------------------------------------------------------------------------------------------------------------------------------------------- Assessment and Plan  Essential hypertension Blood pressure is at goal at for age and co-morbidities.  I recommend continuation of current medications.  In addition they were instructed to follow a low sodium diet with regular exercise to help to maintain adequate control of blood pressure.    Chronic obstructive pulmonary disease (HCC) Stable with daily use of wixela, continue at current strength.    PAF (paroxysmal atrial fibrillation) Rhythm  controlled with amiodarone, s/p RF ablation.    Hypothyroid Thyroid function stable, continue current dose of levothyroxine.     Meds ordered this encounter  Medications  . levothyroxine (SYNTHROID) 125 MCG tablet    Sig: Take 1 tablet (125 mcg total) by mouth daily before breakfast.    Dispense:  90 tablet    Refill:  3  . Fluticasone-Salmeterol (WIXELA INHUB) 250-50 MCG/DOSE AEPB    Sig: Inhale 1 puff into the lungs 2 (two) times daily.    Dispense:  60 each    Refill:  3    Return in about 4  months (around 05/21/2020) for Follow up thyroid.    This visit occurred during the SARS-CoV-2 public health emergency.  Safety protocols were in place, including screening questions prior to the visit, additional usage of staff PPE, and extensive cleaning of exam room while observing appropriate contact time as indicated for disinfecting solutions.

## 2020-01-22 NOTE — Patient Instructions (Signed)
Great to see you today! See me again in about 4 months.   We'll need to update thyroid labs at this visit.

## 2020-01-22 NOTE — Assessment & Plan Note (Signed)
>>  ASSESSMENT AND PLAN FOR PAF (PAROXYSMAL ATRIAL FIBRILLATION) (HCC) WRITTEN ON 01/22/2020 10:59 PM BY MATTHEWS, CODY, DO  Rhythm controlled with amiodarone , s/p RF ablation.

## 2020-01-22 NOTE — Assessment & Plan Note (Signed)
Thyroid function stable, continue current dose of levothyroxine.

## 2020-01-22 NOTE — Assessment & Plan Note (Signed)
Blood pressure is at goal at for age and co-morbidities.  I recommend continuation of current medications.  In addition they were instructed to follow a low sodium diet with regular exercise to help to maintain adequate control of blood pressure.  ? ?

## 2020-01-22 NOTE — Assessment & Plan Note (Signed)
Stable with daily use of wixela, continue at current strength.

## 2020-02-01 ENCOUNTER — Telehealth: Payer: Self-pay | Admitting: Cardiovascular Disease

## 2020-02-01 DIAGNOSIS — I5032 Chronic diastolic (congestive) heart failure: Secondary | ICD-10-CM

## 2020-02-01 NOTE — Telephone Encounter (Signed)
Spoke with patient regarding his recent blood pressure readings. patient states that over the last few days he has had intermittently high blood pressures.   Blood pressure 11/7 was 173/62                           11/10 AM was 187/66                                    PM was 143/64                            11/11 afternoon was 129/66  Patient states that his heart rate has been in the 60's and feels regular. Patient does state that he has a little swelling in his ankles from standing today and he took 20mg  of lasix for that this afternoon. Patient states this is the first lasix he has taken in several days. Patient denies any other symptoms and states that he feels completely fine. Patient wanted to make sure to let Dr. Sallyanne Kuster know about his blood pressure. Advised patient I would forward this message to Dr. Sallyanne Kuster for review and advice.

## 2020-02-01 NOTE — Telephone Encounter (Signed)
Samuel Mcconnell is calling requesting to speak with a nurse in regards to medications and some numbers, but did not specify any further. Please advise.

## 2020-02-01 NOTE — Telephone Encounter (Signed)
Please start losartan 50 mg daily. Check BMET in 3 weeks, please.

## 2020-02-02 MED ORDER — LOSARTAN POTASSIUM 50 MG PO TABS
50.0000 mg | ORAL_TABLET | Freq: Every day | ORAL | 3 refills | Status: DC
Start: 2020-02-02 — End: 2020-03-21

## 2020-02-02 NOTE — Telephone Encounter (Signed)
The patient has been made aware. Losartan 50 mg once daily has been sent in for him and lab orders will be mailed.   He will keep track of his blood pressures.

## 2020-02-17 ENCOUNTER — Other Ambulatory Visit: Payer: Self-pay | Admitting: Cardiovascular Disease

## 2020-02-29 ENCOUNTER — Emergency Department (HOSPITAL_COMMUNITY): Payer: Medicare HMO

## 2020-02-29 ENCOUNTER — Emergency Department (HOSPITAL_COMMUNITY)
Admission: EM | Admit: 2020-02-29 | Discharge: 2020-02-29 | Disposition: A | Discharge status: Home / Self Care | Payer: Medicare HMO | Attending: Emergency Medicine | Admitting: Emergency Medicine

## 2020-02-29 DIAGNOSIS — I10 Essential (primary) hypertension: Secondary | ICD-10-CM | POA: Insufficient documentation

## 2020-02-29 DIAGNOSIS — S51811A Laceration without foreign body of right forearm, initial encounter: Secondary | ICD-10-CM | POA: Insufficient documentation

## 2020-02-29 DIAGNOSIS — W19XXXA Unspecified fall, initial encounter: Secondary | ICD-10-CM | POA: Insufficient documentation

## 2020-02-29 DIAGNOSIS — S0081XA Abrasion of other part of head, initial encounter: Secondary | ICD-10-CM | POA: Insufficient documentation

## 2020-02-29 DIAGNOSIS — Z7901 Long term (current) use of anticoagulants: Secondary | ICD-10-CM

## 2020-02-29 DIAGNOSIS — I4891 Unspecified atrial fibrillation: Secondary | ICD-10-CM | POA: Insufficient documentation

## 2020-02-29 DIAGNOSIS — Z20822 Contact with and (suspected) exposure to covid-19: Secondary | ICD-10-CM | POA: Diagnosis not present

## 2020-02-29 DIAGNOSIS — Z87891 Personal history of nicotine dependence: Secondary | ICD-10-CM | POA: Diagnosis not present

## 2020-02-29 DIAGNOSIS — S0990XA Unspecified injury of head, initial encounter: Secondary | ICD-10-CM | POA: Diagnosis present

## 2020-02-29 DIAGNOSIS — S41111A Laceration without foreign body of right upper arm, initial encounter: Secondary | ICD-10-CM

## 2020-02-29 DIAGNOSIS — S81811A Laceration without foreign body, right lower leg, initial encounter: Secondary | ICD-10-CM | POA: Diagnosis not present

## 2020-02-29 LAB — COMPREHENSIVE METABOLIC PANEL
ALT: 23 U/L (ref 0–44)
AST: 29 U/L (ref 15–41)
Albumin: 3.6 g/dL (ref 3.5–5.0)
Alkaline Phosphatase: 52 U/L (ref 38–126)
Anion gap: 7 (ref 5–15)
BUN: 28 mg/dL — ABNORMAL HIGH (ref 8–23)
CO2: 29 mmol/L (ref 22–32)
Calcium: 9.2 mg/dL (ref 8.9–10.3)
Chloride: 103 mmol/L (ref 98–111)
Creatinine, Ser: 1.53 mg/dL — ABNORMAL HIGH (ref 0.61–1.24)
GFR, Estimated: 43 mL/min — ABNORMAL LOW (ref >60)
Glucose, Bld: 100 mg/dL — ABNORMAL HIGH (ref 70–99)
Potassium: 4.8 mmol/L (ref 3.5–5.1)
Sodium: 139 mmol/L (ref 135–145)
Total Bilirubin: 0.7 mg/dL (ref 0.3–1.2)
Total Protein: 5.9 g/dL — ABNORMAL LOW (ref 6.5–8.1)

## 2020-02-29 LAB — CBC
HCT: 38.1 % — ABNORMAL LOW (ref 39.0–52.0)
Hemoglobin: 12 g/dL — ABNORMAL LOW (ref 13.0–17.0)
MCH: 28.8 pg (ref 26.0–34.0)
MCHC: 31.5 g/dL (ref 30.0–36.0)
MCV: 91.4 fL (ref 80.0–100.0)
Platelets: 257 10*3/uL (ref 150–400)
RBC: 4.17 MIL/uL — ABNORMAL LOW (ref 4.22–5.81)
RDW: 14.1 % (ref 11.5–15.5)
WBC: 7.4 10*3/uL (ref 4.0–10.5)
nRBC: 0 % (ref 0.0–0.2)

## 2020-02-29 LAB — LACTIC ACID, PLASMA: Lactic Acid, Venous: 1.3 mmol/L (ref 0.5–1.9)

## 2020-02-29 LAB — RESP PANEL BY RT-PCR (FLU A&B, COVID) ARPGX2
Influenza A by PCR: NEGATIVE
Influenza B by PCR: NEGATIVE
SARS Coronavirus 2 by RT PCR: NEGATIVE

## 2020-02-29 LAB — PROTIME-INR
INR: 1.4 — ABNORMAL HIGH (ref 0.8–1.2)
Prothrombin Time: 16.8 seconds — ABNORMAL HIGH (ref 11.4–15.2)

## 2020-02-29 LAB — SAMPLE TO BLOOD BANK

## 2020-02-29 LAB — TROPONIN I (HIGH SENSITIVITY): Troponin I (High Sensitivity): 6 ng/L (ref <18)

## 2020-02-29 MED ORDER — SODIUM CHLORIDE 0.9 % IV BOLUS: 500.0000 mL | Freq: Once | INTRAVENOUS | Status: DC

## 2020-02-29 MED ORDER — LIDOCAINE HCL (PF) 1 % IJ SOLN
30.0000 mL | Freq: Once | INTRAMUSCULAR | Status: AC
  Administered 2020-02-29: 30 mL
  Filled 2020-02-29: qty 30

## 2020-02-29 MED ORDER — LIDOCAINE-EPINEPHRINE 1 %-1:100000 IJ SOLN
20.0000 mL | Freq: Once | INTRAMUSCULAR | Status: AC
  Administered 2020-02-29: 20 mL via INTRADERMAL
  Filled 2020-02-29: qty 1

## 2020-02-29 NOTE — Progress Notes (Signed)
Orthopedic Tech Progress Note Patient Details:  Samuel Mcconnell February 12, 1928 [id number] Level 2 Trauma. Not currently needed Patient ID: Samuel Mcconnell, male   DOB: Sep 26, 1927, 86 y.o.   MRN: [id number]   Samuel Mcconnell 02/29/2020, 12:58 PM

## 2020-02-29 NOTE — ED Triage Notes (Signed)
To ED via [facility] -- fell after having blood drawn this am, felt "faint while on escalator" while at [facility] this am. Initially for EMS had a BP of 74/P , was pale and diaphoretic, lethargic -- on arrival to

## 2020-02-29 NOTE — ED Provider Notes (Signed)
[facility] Emergency Department Provider Note MRN:  [id number]  Arrival date & time: 02/29/20     Chief Complaint   fall on thinners and level 2   History of Present Illness   Samuel Mcconnell is a 86 y.o. year-old male with a history of A. fib presenting to the ED with chief complaint of fall.  Patient fell backwards while on a escalator.  Unsure how he fell, may have felt lightheaded.  Denies any chest pain or shortness of breath.  Sustained injuries to his right forearm, right shin, hit the back of his head.  On blood thinners.  Review of Systems  A complete 10 system review of systems was obtained and all systems are negative except as noted in the HPI and PMH.   Patient's Health History    Past Medical History:  Diagnosis Date  . Atrial fibrillation ([place])    radiofrequency ablation  . Emphysema   . Hypertension   . Thyroid disease     Past Surgical History:  Procedure Laterality Date  . CARDIAC SURGERY    . CARDIOVERSION N/A 10/24/2019   Procedure: CARDIOVERSION;  Surgeon: Sanda Klein, MD;  Location: [place] [facility];  Service: Cardiovascular;  Laterality: N/A;  . [place]  . KNEE ARTHROSCOPY Left 07/2000  . LOOP RECORDER IMPLANT  01/16/2010   Medtronic Reveal XT (Dr. Norlene Duel)   . RADIOFREQUENCY ABLATION  06/13/2009  . SHOULDER ARTHROSCOPY WITH ROTATOR CUFF REPAIR Left 04/1996  . TIBIAL TUBERCLERPLASTY  ~ 10/2010  . TRANSTHORACIC ECHOCARDIOGRAM  11/19/2011   EF=>55%; mild MR, mild mitral annular calcif; mild TR, normal RSVP; AV mildly sclerotic, mild AV regurg; trace pulm valve regurg     Family History  Problem Relation Age of Onset  . Heart failure Father        MI - died @ 70  . Heart failure Mother   . Diabetes Son   . Pancreatitis Son        also ETOH abuse   . Cancer Maternal Grandmother   . Heart disease Maternal Grandfather   . Atrial fibrillation Brother        dx'ed age 3  . Atrial fibrillation Brother        dx'ed  age 27  . Heart failure Brother   . Atrial fibrillation Sister   . Hypertension Sister   . Heart Problems Sister   . Diabetes Sister   . Heart failure Sister     Social History   Socioeconomic History  . Marital status: Married    Spouse name: Not on file  . Number of children: 2  . Years of education: Not on file  . Highest education level: Not on file  Occupational History  . Occupation: Retired  Tobacco Use  . Smoking status: Former Smoker    Years: 46.00    Quit date: 03/14/2002    Years since quitting: 17.9  . Smokeless tobacco: Never Used  . Tobacco comment: 40 pack year history   Vaping Use  . Vaping Use: Never used  Substance and Sexual Activity  . Alcohol use: No  . Drug use: No  . Sexual activity: Not Currently    Partners: Female  Other Topics Concern  . Not on file  Social History Narrative  . Not on file   Social Determinants of Health   Financial Resource Strain: Not on file  Food Insecurity: Not on file  Transportation Needs: Not on file  Physical Activity: Not  on file  Stress: Not on file  Social Connections: Not on file  Intimate Partner Violence: Not on file     Physical Exam   Vitals:   02/29/20 1300 02/29/20 1400  BP: 113/67 132/73  Pulse: 63 62  Resp: 17 14  Temp:    SpO2: 98% 97%    CONSTITUTIONAL: Well-appearing, NAD NEURO:  Alert and oriented x 3, no focal deficits EYES:  eyes equal and reactive ENT/NECK:  no LAD, no JVD CARDIO: Regular rate, well-perfused, normal S1 and S2 PULM:  CTAB no wheezing or rhonchi GI/GU:  normal bowel sounds, non-distended, non-tender MSK/SPINE:  No gross deformities, no edema SKIN: Laceration to the right forearm, right shin, scattered abrasions and skin tears, abrasion to back of the head PSYCH:  Appropriate speech and behavior  *Additional and/or pertinent findings included in MDM below  Diagnostic and Interventional Summary    EKG Interpretation  Date/Time:  Thursday February 29 2020  12:26:35 EST Ventricular Rate:  65 PR Interval:    QRS Duration: 164 QT Interval:  487 QTC Calculation: 507 R Axis:   54 Text Interpretation: Sinus rhythm Prolonged PR interval Right bundle branch block Confirmed by Gerlene Fee [id number]) on 02/29/2020 12:36:54 PM      Labs Reviewed  COMPREHENSIVE METABOLIC PANEL - Abnormal; Notable for the following components:      Result Value   Glucose, Bld 100 (*)    BUN 28 (*)    Creatinine, Ser 1.53 (*)    Total Protein 5.9 (*)    GFR, Estimated 43 (*)    All other components within normal limits  CBC - Abnormal; Notable for the following components:   RBC 4.17 (*)    Hemoglobin 12.0 (*)    HCT 38.1 (*)    All other components within normal limits  PROTIME-INR - Abnormal; Notable for the following components:   Prothrombin Time 16.8 (*)    INR 1.4 (*)    All other components within normal limits  RESP PANEL BY RT-PCR (FLU A&B, COVID) ARPGX2  LACTIC ACID, PLASMA  SAMPLE TO BLOOD BANK  TROPONIN I (HIGH SENSITIVITY)  TROPONIN I (HIGH SENSITIVITY)    CT HEAD WO CONTRAST  Final Result    CT CERVICAL SPINE WO CONTRAST  Final Result    DG Chest Portable 1 View  Final Result    DG Tibia/Fibula Right  Final Result    DG Forearm Right  Final Result      Medications  sodium chloride 0.9 % bolus 500 mL (has no administration in time range)  lidocaine (PF) (XYLOCAINE) 1 % injection 30 mL (30 mLs Other Given 02/29/20 1430)  lidocaine-EPINEPHrine (XYLOCAINE W/EPI) 1 %-1:100000 (with pres) injection 20 mL (20 mLs Intradermal Given 02/29/20 1501)     Procedures  /  Critical Care .Marland KitchenLaceration Repair  Date/Time: 02/29/2020 3:15 PM Performed by: Maudie Flakes, MD Authorized by: Maudie Flakes, MD   Consent:    Consent obtained:  Verbal   Consent given by:  Patient   Risks discussed:  Infection, need for additional repair, nerve damage, pain, poor cosmetic result, poor wound healing, vascular damage, tendon damage and retained  foreign body Universal protocol:    Procedure explained and questions answered to patient or proxy's satisfaction: yes     Imaging studies available: yes   Anesthesia:    Anesthesia method:  Local infiltration   Local anesthetic:  Lidocaine 1% WITH epi Laceration details:    Location:  Shoulder/arm  Shoulder/arm location:  R lower arm   Length (cm):  10   Depth (mm):  4 Pre-procedure details:    Preparation:  Patient was prepped and draped in usual sterile fashion and imaging obtained to evaluate for foreign bodies Exploration:    Hemostasis achieved with:  Tied off vessels and direct pressure   Imaging outcome: foreign body not noted     Wound exploration: wound explored through full range of motion and entire depth of wound visualized     Contaminated: no   Treatment:    Area cleansed with:  Saline   Amount of cleaning:  Extensive   Debridement:  None   Undermining:  None Skin repair:    Repair method:  Sutures   Suture size:  3-0   Suture material:  Nylon   Suture technique:  Simple interrupted and horizontal mattress   Number of sutures:  7 Approximation:    Approximation:  Close Repair type:    Repair type:  Intermediate Post-procedure details:    Dressing:  Non-adherent dressing   Procedure completion:  Tolerated Comments:     Single underlying arterial vessel with active bleeding, hemostasis achieved with single figure-of-eight stitch.  Wound then closed with nylon as described. Marland Kitchen.Laceration Repair  Date/Time: 02/29/2020 3:18 PM Performed by: Maudie Flakes, MD Authorized by: Maudie Flakes, MD   Consent:    Consent obtained:  Verbal   Consent given by:  Patient   Risks discussed:  Infection, need for additional repair, nerve damage, poor wound healing, poor cosmetic result, pain, retained foreign body, tendon damage and vascular damage Universal protocol:    Procedure explained and questions answered to patient or proxy's satisfaction: yes     Imaging  studies available: yes     Patient identity confirmed:  Verbally with patient Anesthesia:    Anesthesia method:  Local infiltration   Local anesthetic:  Lidocaine 1% WITH epi Laceration details:    Location:  Leg   Leg location:  R lower leg   Length (cm):  7   Depth (mm):  3 Exploration:    Limited defect created (wound extended): yes     Hemostasis achieved with:  Tied off vessels and direct pressure   Imaging obtained: x-ray     Imaging outcome: foreign body not noted     Wound exploration: wound explored through full range of motion and entire depth of wound visualized   Treatment:    Area cleansed with:  Saline   Amount of cleaning:  Extensive   Undermining:  Minimal Skin repair:    Repair method:  Sutures   Suture size:  3-0   Suture material:  Nylon   Suture technique:  Horizontal mattress and simple interrupted   Number of sutures:  10 Approximation:    Approximation:  Close Repair type:    Repair type:  Intermediate Post-procedure details:    Dressing:  Non-adherent dressing   Procedure completion:  Tolerated well, no immediate complications Comments:     2 figure-of-eight stitches required to control arterial vessel with active bleeding, Vicryl used.  Skin closed as described.    ED Course and Medical Decision Making  I have reviewed the triage vital signs, the nursing notes, and pertinent available records from the EMR.  Listed above are laboratory and imaging tests that I personally ordered, reviewed, and interpreted and then considered in my medical decision making (see below for details).  Concern for high risk near syncope, EKG demonstrating bundle branch block  and so patient does have structural or electrical heart abnormalities.  Low concern for significant traumatic injury, awaiting imaging, labs.  Suspect will need admission.     Patient is now accompanied by daughter and spouse, they were with the patient and witnessed the event.  They are very  confident that patient simply stumbled or lost his balance.  He has a long history of having balance issues.  They do not feel he was lightheaded or passed out.  Patient denies passing out, now he denies having any dizziness or lightheadedness.  No significant injuries on CT imaging, labs reassuring, suture repair as described above.  Appropriate for discharge.  Barth Kirks. Sedonia Small, [place] [email or web address]  Final Clinical Impressions(s) / ED Diagnoses     ICD-10-CM   1. Fall, initial encounter  W19.[facility]     ED Discharge Orders    None       Discharge Instructions Discussed with and Provided to Patient:     Discharge Instructions     You were evaluated in the Emergency Department and after careful evaluation, we did not find any emergent condition requiring admission or further testing in the hospital.  Your exam/testing today was overall reassuring.  Your test today did not show any significant injuries.  Your stitches will need to be removed by healthcare professional in 10 to 14 days.  We recommend Tylenol 1000 mg every 4-6 hours for soreness.  Please return to the Emergency Department if you experience any worsening of your condition.  Thank you for allowing [place] to be a part of your care.        Maudie Flakes, MD 02/29/20 [phone]

## 2020-02-29 NOTE — Discharge Instructions (Addendum)
You were evaluated in the Emergency Department and after careful evaluation, we did not find any emergent condition requiring admission or further testing in the hospital.  Your exam/testing today was overall reassuring.  Your test today did not show any significant injuries.  Your stitches will need to be removed by healthcare professional in 10 to 14 days.  We recommend Tylenol 1000 mg every 4-6 hours for soreness.  Please return to the Emergency Department if you experience any worsening of your condition.  Thank you for allowing [place] to be a part of your care.

## 2020-03-01 ENCOUNTER — Encounter: Payer: Self-pay | Admitting: Family Medicine

## 2020-03-01 ENCOUNTER — Telehealth: Payer: Self-pay | Admitting: Family Medicine

## 2020-03-01 LAB — BASIC METABOLIC PANEL
BUN/Creatinine Ratio: 19 (ref 10–24)
BUN: 27 mg/dL (ref 10–36)
CO2: 26 mmol/L (ref 20–29)
Calcium: 9.6 mg/dL (ref 8.6–10.2)
Chloride: 101 mmol/L (ref 96–106)
Creatinine, Ser: 1.43 mg/dL — ABNORMAL HIGH (ref 0.76–1.27)
GFR calc Af Amer: 49 mL/min/{1.73_m2} — ABNORMAL LOW (ref >59)
GFR calc non Af Amer: 43 mL/min/{1.73_m2} — ABNORMAL LOW (ref >59)
Glucose: 70 mg/dL (ref 65–99)
Potassium: 5.3 mmol/L — ABNORMAL HIGH (ref 3.5–5.2)
Sodium: 141 mmol/L (ref 134–144)

## 2020-03-01 NOTE — Telephone Encounter (Signed)
Called to check on Samuel Mcconnell today.  He states he is doing ok today.  No dizziness or headaches.  No bleeding around sutures.  He will follow up with me for suture removal.

## 2020-03-06 ENCOUNTER — Other Ambulatory Visit: Payer: Self-pay

## 2020-03-06 ENCOUNTER — Ambulatory Visit (INDEPENDENT_AMBULATORY_CARE_PROVIDER_SITE_OTHER): Payer: Medicare HMO | Admitting: Family Medicine

## 2020-03-06 ENCOUNTER — Encounter: Payer: Self-pay | Admitting: Family Medicine

## 2020-03-06 DIAGNOSIS — L03115 Cellulitis of right lower limb: Secondary | ICD-10-CM

## 2020-03-06 DIAGNOSIS — S81812A Laceration without foreign body, left lower leg, initial encounter: Secondary | ICD-10-CM | POA: Diagnosis not present

## 2020-03-06 DIAGNOSIS — S41119A Laceration without foreign body of unspecified upper arm, initial encounter: Secondary | ICD-10-CM | POA: Insufficient documentation

## 2020-03-06 DIAGNOSIS — L0390 Cellulitis, unspecified: Secondary | ICD-10-CM | POA: Insufficient documentation

## 2020-03-06 MED ORDER — "XEROFORM PETROLAT GAUZE 5""X9"" EX MISC": CUTANEOUS | 0 refills | Status: DC

## 2020-03-06 MED ORDER — CEPHALEXIN 500 MG PO CAPS: 500.0000 mg | ORAL_CAPSULE | Freq: Four times a day (QID) | ORAL | 0 refills | Status: DC

## 2020-03-06 NOTE — Assessment & Plan Note (Signed)
Skin tear to left knee does not appear infected.  Xeroform gauze applied and covered with nonadherent pad.

## 2020-03-06 NOTE — Assessment & Plan Note (Signed)
Wound is redressed today. We will start Keflex for treatment of cellulitis.  I will plan to see him back in approximately 1 week.  They will contact me if his symptoms are worsening.

## 2020-03-06 NOTE — Assessment & Plan Note (Signed)
Skin tear of upper extremity.  Appears to be healing well.  New dressing applied today.

## 2020-03-06 NOTE — Progress Notes (Signed)
Samuel Mcconnell - 86 y.o. male MRN [id number]  Date of birth: October 08, 1927  Subjective No chief complaint on file.   HPI Samuel Mcconnell is a 86 year old male with history of paroxysmal atrial fibrillation, hypertension, COPD and CKD here today for follow-up of fall.  He was initially seen in the ER after experiencing a fall from escalator approximately 1 week ago.  He had multiple skin tears and lacerations.  There were a couple small arterial bleeds that were tied off and he had repair of his right arm and right lower leg with suture.  He is on Eliquis.  He denies any head injury.  He is brought in today due to increased redness of the right lower extremity with some purulent appearing drainage.  He continues to have some oozing from these wounds.  ROS:  A comprehensive ROS was completed and negative except as noted per HPI  Allergies  Allergen Reactions  . Clindamycin/Lincomycin Other (See Comments)    Pt felt weird    . Dabigatran Etexilate Mesylate Other (See Comments)    "felt weird"  . Doxycycline Other (See Comments)    Dyspepsia    Past Medical History:  Diagnosis Date  . Atrial fibrillation ([place])    radiofrequency ablation  . Emphysema   . Hypertension   . Thyroid disease     Past Surgical History:  Procedure Laterality Date  . CARDIAC SURGERY    . CARDIOVERSION N/A 10/24/2019   Procedure: CARDIOVERSION;  Surgeon: Sanda Klein, MD;  Location: [place] [facility];  Service: Cardiovascular;  Laterality: N/A;  . [place]  . KNEE ARTHROSCOPY Left 07/2000  . LOOP RECORDER IMPLANT  01/16/2010   Medtronic Reveal XT (Dr. Norlene Duel)   . RADIOFREQUENCY ABLATION  06/13/2009  . SHOULDER ARTHROSCOPY WITH ROTATOR CUFF REPAIR Left 04/1996  . TIBIAL TUBERCLERPLASTY  ~ 10/2010  . TRANSTHORACIC ECHOCARDIOGRAM  11/19/2011   EF=>55%; mild MR, mild mitral annular calcif; mild TR, normal RSVP; AV mildly sclerotic, mild AV regurg; trace pulm valve regurg     Social History   Socioeconomic  History  . Marital status: Married    Spouse name: Not on file  . Number of children: 2  . Years of education: Not on file  . Highest education level: Not on file  Occupational History  . Occupation: Retired  Tobacco Use  . Smoking status: Former Smoker    Years: 46.00    Quit date: 03/14/2002    Years since quitting: 17.9  . Smokeless tobacco: Never Used  . Tobacco comment: 40 pack year history   Vaping Use  . Vaping Use: Never used  Substance and Sexual Activity  . Alcohol use: No  . Drug use: No  . Sexual activity: Not Currently    Partners: Female  Other Topics Concern  . Not on file  Social History Narrative  . Not on file   Social Determinants of Health   Financial Resource Strain: Not on file  Food Insecurity: Not on file  Transportation Needs: Not on file  Physical Activity: Not on file  Stress: Not on file  Social Connections: Not on file    Family History  Problem Relation Age of Onset  . Heart failure Father        MI - died @ 39  . Heart failure Mother   . Diabetes Son   . Pancreatitis Son        also ETOH abuse   . Cancer Maternal Grandmother   . Heart  disease Maternal Grandfather   . Atrial fibrillation Brother        dx'ed age 50  . Atrial fibrillation Brother        dx'ed age 59  . Heart failure Brother   . Atrial fibrillation Sister   . Hypertension Sister   . Heart Problems Sister   . Diabetes Sister   . Heart failure Sister     Health Maintenance  Topic Date Due  . COVID-19 Vaccine (3 - Booster for Pfizer series) 11/29/2019  . TETANUS/TDAP  09/06/2029  . INFLUENZA VACCINE  Completed  . PNA vac Low Risk Adult  Completed     ----------------------------------------------------------------------------------------------------------------------------------------------------------------------------------------------------------------- Physical Exam BP 139/67 (BP Location: Left Arm, Patient Position: Sitting, Cuff Size: Large)   Pulse  71   Wt 185 lb (83.9 kg)   SpO2 96%   BMI 25.80 kg/m   Physical Exam Constitutional:      Appearance: Normal appearance.  Eyes:     General: No scleral icterus. Musculoskeletal:     Cervical back: Neck supple.  Skin:    Comments: Skin tear to right upper extremity.  A clean nonadherent dressing was applied to this area.  There is a laceration to the right forearm that has been repaired with suture.  There is a hematoma adjacent to this.  Wound appears to be healing well at this time.  Right lower extremity skin tear and laceration.  There continues to be some bleeding from this Xeroform dressing is applied to this area, covered with nonadherent pad and then 4 x 4.  Pressure dressing was reapplied.  Left knee skin tear is covered Xeroform and nonadherent dressing.  There is erythema and swelling on the right lower extremity.  Neurological:     General: No focal deficit present.     Mental Status: He is alert.     ------------------------------------------------------------------------------------------------------------------------------------------------------------------------------------------------------------------- Assessment and Plan  Cellulitis Wound is redressed today. We will start Keflex for treatment of cellulitis.  I will plan to see him back in approximately 1 week.  They will contact me if his symptoms are worsening.  Skin tear of upper extremity Skin tear of upper extremity.  Appears to be healing well.  New dressing applied today.  Noninfected skin tear of lower extremity, left, initial encounter Skin tear to left knee does not appear infected.  Xeroform gauze applied and covered with nonadherent pad.  Prescription for Xeroform gauze sent in.  Daughter will continue dressing changes every 2 to 3 days.  Meds ordered this encounter  Medications  . cephALEXin (KEFLEX) 500 MG capsule    Sig: Take 1 capsule (500 mg total) by mouth 4 (four) times daily for 7  days.    Dispense:  28 capsule    Refill:  0  . Bismuth Tribromoph-Petrolatum (XEROFORM PETROLAT GAUZE 5"X9") MISC    Sig: Apply topically every 2-3 days.    Dispense:  10 each    Refill:  0    Return in about 1 week (around 03/13/2020) for suture removal.    This visit occurred during the SARS-CoV-2 public health emergency.  Safety protocols were in place, including screening questions prior to the visit, additional usage of staff PPE, and extensive cleaning of exam room while observing appropriate contact time as indicated for disinfecting solutions.

## 2020-03-13 ENCOUNTER — Ambulatory Visit (INDEPENDENT_AMBULATORY_CARE_PROVIDER_SITE_OTHER): Payer: Medicare HMO | Admitting: Family Medicine

## 2020-03-13 ENCOUNTER — Other Ambulatory Visit: Payer: Self-pay

## 2020-03-13 ENCOUNTER — Encounter: Payer: Self-pay | Admitting: Family Medicine

## 2020-03-13 VITALS — BP 159/72 | HR 66 | Wt 185.0 lb

## 2020-03-13 DIAGNOSIS — S81812A Laceration without foreign body, left lower leg, initial encounter: Secondary | ICD-10-CM | POA: Diagnosis not present

## 2020-03-13 DIAGNOSIS — S51811A Laceration without foreign body of right forearm, initial encounter: Secondary | ICD-10-CM | POA: Insufficient documentation

## 2020-03-13 DIAGNOSIS — S51811D Laceration without foreign body of right forearm, subsequent encounter: Secondary | ICD-10-CM

## 2020-03-13 DIAGNOSIS — S81801D Unspecified open wound, right lower leg, subsequent encounter: Secondary | ICD-10-CM | POA: Diagnosis not present

## 2020-03-13 DIAGNOSIS — S41119A Laceration without foreign body of unspecified upper arm, initial encounter: Secondary | ICD-10-CM

## 2020-03-13 DIAGNOSIS — L03115 Cellulitis of right lower limb: Secondary | ICD-10-CM

## 2020-03-13 DIAGNOSIS — R600 Localized edema: Secondary | ICD-10-CM

## 2020-03-13 MED ORDER — CEPHALEXIN 500 MG PO CAPS: 500.0000 mg | ORAL_CAPSULE | Freq: Four times a day (QID) | ORAL | 0 refills | Status: AC

## 2020-03-13 NOTE — Assessment & Plan Note (Addendum)
Right lower extremity wound sutures removed. There continues to be some erythema that surrounding with increased swelling and serous drainage.  I did culture the serous drainage and we will continue him on the Keflex for now. There is some pallor around the wound edges.  We will place consult for home health wound care for evaluation and continued monitoring of this.  He does have good granulation tissue in the wound bed at this time.

## 2020-03-13 NOTE — Assessment & Plan Note (Signed)
This appears to be healing well.  We will need to monitor skin flap to be sure this remains viable.

## 2020-03-13 NOTE — Assessment & Plan Note (Signed)
I will have him briefly increase his Lasix to twice per day for the next week.

## 2020-03-13 NOTE — Patient Instructions (Addendum)
Continue dressing changes ever 2-3 days.  Increase lasix to twice daily for the next week.  Keep leg elevated.  Continue cephalexin for additional week.  Home health wound care orders placed

## 2020-03-13 NOTE — Assessment & Plan Note (Signed)
Sutures removed from forearm.  This appears to be healing well at this time.  There is an adjacent hematoma that should resolve over time.

## 2020-03-13 NOTE — Progress Notes (Signed)
Samuel Mcconnell - 86 y.o. male MRN [id number]  Date of birth: Feb 23, 1928  Subjective Chief Complaint  Patient presents with  . wound care    HPI Samuel Mcconnell is a 86 year old male here today for follow-up of wound and suture removal.  He had a mechanical fall on an escalator on 12 9 resulting in multiple lacerations and skin tears.  Please see previous note from 12/15 as well as ED note from 12/9 for full details.  He is here today for suture removal.  Previous ER note reviewed indicates 10 sutures were placed in the right lower extremity as well as 7 sutures in the right forearm.  I only count 8 sutures to the right lower extremity so not sure if the suture count included 2 figure-of-eight stitches used to control bleeding.  He does continue to have swelling with serous drainage from the right lower extremity.  There is some erythema that extends around the wound along the lateral side of the leg.  He has been on Keflex for the past week.  He denies significant pain and he has not had any fevers.  He has been trying to keep the leg elevated when at rest.  His daughter has been doing dressing changes every couple of days.  There is a hematoma on the right forearm.  Wound appears to be healing well.  Skin tear on right upper arm appears to be healing well.  Left knee appears to be healing well.  ROS:  A comprehensive ROS was completed and negative except as noted per HPI  Allergies  Allergen Reactions  . Clindamycin/Lincomycin Other (See Comments)    Pt felt weird    . Dabigatran Etexilate Mesylate Other (See Comments)    "felt weird"  . Doxycycline Other (See Comments)    Dyspepsia    Past Medical History:  Diagnosis Date  . Atrial fibrillation ([place])    radiofrequency ablation  . Emphysema   . Hypertension   . Thyroid disease     Past Surgical History:  Procedure Laterality Date  . CARDIAC SURGERY    . CARDIOVERSION N/A 10/24/2019   Procedure: CARDIOVERSION;  Surgeon: Sanda Klein, MD;  Location: [facility];  Service: Cardiovascular;  Laterality: N/A;  . [place]  . KNEE ARTHROSCOPY Left 07/2000  . LOOP RECORDER IMPLANT  01/16/2010   Medtronic Reveal XT (Dr. Norlene Duel)   . RADIOFREQUENCY ABLATION  06/13/2009  . SHOULDER ARTHROSCOPY WITH ROTATOR CUFF REPAIR Left 04/1996  . TIBIAL TUBERCLERPLASTY  ~ 10/2010  . TRANSTHORACIC ECHOCARDIOGRAM  11/19/2011   EF=>55%; mild MR, mild mitral annular calcif; mild TR, normal RSVP; AV mildly sclerotic, mild AV regurg; trace pulm valve regurg     Social History   Socioeconomic History  . Marital status: Married    Spouse name: Not on file  . Number of children: 2  . Years of education: Not on file  . Highest education level: Not on file  Occupational History  . Occupation: Retired  Tobacco Use  . Smoking status: Former Smoker    Years: 46.00    Quit date: 03/14/2002    Years since quitting: 18.0  . Smokeless tobacco: Never Used  . Tobacco comment: 40 pack year history   Vaping Use  . Vaping Use: Never used  Substance and Sexual Activity  . Alcohol use: No  . Drug use: No  . Sexual activity: Not Currently    Partners: Female  Other Topics Concern  . Not on  file  Social History Narrative  . Not on file   Social Determinants of Health   Financial Resource Strain: Not on file  Food Insecurity: Not on file  Transportation Needs: Not on file  Physical Activity: Not on file  Stress: Not on file  Social Connections: Not on file    Family History  Problem Relation Age of Onset  . Heart failure Father        MI - died @ 32  . Heart failure Mother   . Diabetes Son   . Pancreatitis Son        also ETOH abuse   . Cancer Maternal Grandmother   . Heart disease Maternal Grandfather   . Atrial fibrillation Brother        dx'ed age 26  . Atrial fibrillation Brother        dx'ed age 28  . Heart failure Brother   . Atrial fibrillation Sister   . Hypertension Sister   . Heart Problems Sister   .  Diabetes Sister   . Heart failure Sister     Health Maintenance  Topic Date Due  . COVID-19 Vaccine (3 - Booster for Pfizer series) 11/29/2019  . TETANUS/TDAP  09/06/2029  . INFLUENZA VACCINE  Completed  . PNA vac Low Risk Adult  Completed     ----------------------------------------------------------------------------------------------------------------------------------------------------------------------------------------------------------------- Physical Exam BP (!) 159/72 (BP Location: Left Arm, Patient Position: Sitting, Cuff Size: Large)   Pulse 66   Wt 185 lb (83.9 kg)   BMI 25.80 kg/m   Physical Exam Constitutional:      Appearance: Normal appearance.  HENT:     Head: Normocephalic and atraumatic.  Eyes:     General: No scleral icterus. Musculoskeletal:     Cervical back: Neck supple.     Comments: 7 sutures removed from right forearm.  Tolerated well.  No wound dehiscence noted.  Small amount of bleeding after suture removal, controlled with direct pressure.  Scabbed over wound to the right upper extremity appears to be healing well.  There is a small skin flap that is slid down away from the wound but appears to remain viable.  Right lower extremity with edema and surrounding erythema.  There is good granulation tissue in the wound bed however some of the edges of the skin of the wound have some pallor.  He may need future debridement of this.  8 sutures removed.  He tolerated this well.  Wound was explored and no additional sutures found.  Skin:    General: Skin is warm and dry.  Neurological:     General: No focal deficit present.     Mental Status: He is alert.  Psychiatric:        Mood and Affect: Mood normal.        Behavior: Behavior normal.      ------------------------------------------------------------------------------------------------------------------------------------------------------------------------------------------------------------------- Assessment and Plan  Skin tear of upper extremity This appears to be healing well.  We will need to monitor skin flap to be sure this remains viable.  Noninfected skin tear of lower extremity, left, initial encounter Wound of the left knee appears to be healing well.  Cellulitis Right lower extremity wound sutures removed. There continues to be some erythema that surrounding with increased swelling and serous drainage.  I did culture the serous drainage and we will continue him on the Keflex for now. There is some pallor around the wound edges.  We will place consult for home health wound care for evaluation and continued monitoring of  this.  He does have good granulation tissue in the wound bed at this time.   Bilateral lower extremity edema I will have him briefly increase his Lasix to twice per day for the next week.  Laceration of right forearm Sutures removed from forearm.  This appears to be healing well at this time.  There is an adjacent hematoma that should resolve over time.   Meds ordered this encounter  Medications  . cephALEXin (KEFLEX) 500 MG capsule    Sig: Take 1 capsule (500 mg total) by mouth 4 (four) times daily for 7 days.    Dispense:  28 capsule    Refill:  0    No follow-ups on file.    This visit occurred during the SARS-CoV-2 public health emergency.  Safety protocols were in place, including screening questions prior to the visit, additional usage of staff PPE, and extensive cleaning of exam room while observing appropriate contact time as indicated for disinfecting solutions.

## 2020-03-13 NOTE — Assessment & Plan Note (Signed)
Wound of the left knee appears to be healing well.

## 2020-03-16 LAB — WOUND CULTURE
MICRO NUMBER:: 11351805
SPECIMEN QUALITY:: ADEQUATE

## 2020-03-20 ENCOUNTER — Other Ambulatory Visit: Payer: Self-pay

## 2020-03-20 DIAGNOSIS — I10 Essential (primary) hypertension: Secondary | ICD-10-CM

## 2020-03-20 DIAGNOSIS — I517 Cardiomegaly: Secondary | ICD-10-CM

## 2020-03-20 DIAGNOSIS — I480 Paroxysmal atrial fibrillation: Secondary | ICD-10-CM

## 2020-03-20 NOTE — Telephone Encounter (Signed)
Pt requests refil of eliquis 5mg  be sent to [facility] mail order but based on dosing guidelines they only qualify for the 2.5. will route to pharmd pool for review.  89 yom, 185 lb (83.9 kg), lovw/croitoru(11/17/19). Creatinine, Ser 02/29/20 0.61 - 1.24 mg/dL 1.53High

## 2020-03-21 ENCOUNTER — Other Ambulatory Visit: Payer: Self-pay

## 2020-03-21 MED ORDER — APIXABAN 2.5 MG PO TABS: 2.5000 mg | ORAL_TABLET | Freq: Two times a day (BID) | ORAL | 3 refills | Status: DC

## 2020-03-21 MED ORDER — LEVOTHYROXINE SODIUM 125 MCG PO TABS
125.0000 ug | ORAL_TABLET | Freq: Every day | ORAL | 3 refills | Status: DC
Start: 2020-03-21 — End: 2020-05-08

## 2020-03-21 MED ORDER — AMIODARONE HCL 200 MG PO TABS: 200.0000 mg | ORAL_TABLET | Freq: Every day | ORAL | 3 refills | Status: DC

## 2020-03-21 MED ORDER — LOSARTAN POTASSIUM 50 MG PO TABS: 50.0000 mg | ORAL_TABLET | Freq: Every day | ORAL | 1 refills | Status: DC

## 2020-03-21 MED ORDER — FUROSEMIDE 20 MG PO TABS: 20.0000 mg | ORAL_TABLET | ORAL | 3 refills | Status: DC | PRN

## 2020-03-21 NOTE — Telephone Encounter (Signed)
Spoke with daughter and advised of dose change.  Daughter voiced understanding.  Also requests new medications be sent to [facility] mail order and requests refills on all cardiac medications.  Set [facility] as primary pharmacy.

## 2020-03-25 ENCOUNTER — Other Ambulatory Visit: Payer: Self-pay

## 2020-03-25 ENCOUNTER — Ambulatory Visit: Payer: Medicare HMO | Admitting: Family Medicine

## 2020-03-25 DIAGNOSIS — I480 Paroxysmal atrial fibrillation: Secondary | ICD-10-CM

## 2020-03-25 DIAGNOSIS — I517 Cardiomegaly: Secondary | ICD-10-CM

## 2020-03-25 DIAGNOSIS — I10 Essential (primary) hypertension: Secondary | ICD-10-CM

## 2020-03-25 MED ORDER — FUROSEMIDE 20 MG PO TABS: 20.0000 mg | ORAL_TABLET | ORAL | 3 refills | Status: DC | PRN

## 2020-03-25 MED ORDER — LOSARTAN POTASSIUM 50 MG PO TABS: 50.0000 mg | ORAL_TABLET | Freq: Every day | ORAL | 1 refills | Status: DC

## 2020-03-25 MED ORDER — AMIODARONE HCL 200 MG PO TABS: 200.0000 mg | ORAL_TABLET | Freq: Every day | ORAL | 3 refills | Status: DC

## 2020-03-26 ENCOUNTER — Telehealth: Payer: Self-pay | Admitting: Cardiovascular Disease

## 2020-03-26 ENCOUNTER — Other Ambulatory Visit: Payer: Self-pay

## 2020-03-26 MED ORDER — FLUTICASONE-SALMETEROL 250-50 MCG/DOSE IN AEPB
1.0000 | INHALATION_SPRAY | Freq: Two times a day (BID) | RESPIRATORY_TRACT | 3 refills | Status: DC
Start: 2020-03-26 — End: 2020-06-10

## 2020-03-26 NOTE — Telephone Encounter (Signed)
**Note De-Identified Aalia Greulich Obfuscation** The pts daughter and DPR [name] states that this is concerning the pts Eliquis not Furosemide. She is requesting Eliquis 2.5 mg samples for the pt while waiting on his shipment from mail pharmacy.  Will forward this message to our NL Office to address samples as the pt sees Dr Royann Shivers.

## 2020-03-26 NOTE — Telephone Encounter (Signed)
Called daughter back- we are out of Eliquis 2.5 right now, advised to call back and check in the next few days.  They verbalized understanding.

## 2020-03-26 NOTE — Telephone Encounter (Signed)
 *  STAT* If patient is at the pharmacy, call can be transferred to refill team.   1. Which medications need to be refilled? (please list name of each medication and dose if known)   furosemide (LASIX) 20 MG tablet  2. Which pharmacy/location (including street and city if local pharmacy) is medication to be sent to?  [facility] - [place], [place] - [address]  3. Do they need a 30 day or 90 day supply? 90 day  Prescription did not get sent to pharmacy patient only has 2 days left

## 2020-03-26 NOTE — Telephone Encounter (Signed)
  Patient calling the office for samples of medication:   1.  What medication and dosage are you requesting samples for?  furosemide (LASIX) 20 MG tablet  2.  Are you currently out of this medication?   Only has 2 days left and will need enough to cover him until his prescription comes from mail order pharmacy. Prescription was renewed but did not get sent to [facility], request has been resubmitted to send prescription.

## 2020-03-27 NOTE — Telephone Encounter (Signed)
Daughter following up to see if any samples are available yet.

## 2020-03-27 NOTE — Telephone Encounter (Signed)
Spoke with patients daughter [name] (okay per DPR). Advised patients daughter that samples of Eliquis 2.5mg  have been left at the front desk for pick up. Patients daughter verbalized understanding.    Samples of Eliquis 2.5mg  were left at the front desk for pick up for patient, quantity 2 Boxes, Lot Number [id number] Exp: 1/23

## 2020-03-27 NOTE — Telephone Encounter (Signed)
Refill sent it 03/25/20

## 2020-03-28 ENCOUNTER — Ambulatory Visit (INDEPENDENT_AMBULATORY_CARE_PROVIDER_SITE_OTHER): Payer: Medicare HMO | Admitting: Family Medicine

## 2020-03-28 ENCOUNTER — Encounter: Payer: Self-pay | Admitting: Family Medicine

## 2020-03-28 ENCOUNTER — Other Ambulatory Visit: Payer: Self-pay

## 2020-03-28 DIAGNOSIS — S51811D Laceration without foreign body of right forearm, subsequent encounter: Secondary | ICD-10-CM | POA: Diagnosis not present

## 2020-03-28 DIAGNOSIS — S41119A Laceration without foreign body of unspecified upper arm, initial encounter: Secondary | ICD-10-CM | POA: Diagnosis not present

## 2020-03-28 DIAGNOSIS — S81812A Laceration without foreign body, left lower leg, initial encounter: Secondary | ICD-10-CM

## 2020-03-28 DIAGNOSIS — L03115 Cellulitis of right lower limb: Secondary | ICD-10-CM | POA: Diagnosis not present

## 2020-03-28 NOTE — Assessment & Plan Note (Signed)
Cellulitis appears to be resolved at this point.  Wound to right leg is healing well without any exudate.

## 2020-03-28 NOTE — Assessment & Plan Note (Signed)
Skin tear is healing well.  Tissue remains viable.

## 2020-03-28 NOTE — Assessment & Plan Note (Signed)
Left knee continues to heal well.  No signs of infection at this time.

## 2020-03-28 NOTE — Assessment & Plan Note (Signed)
This continues to heal well.

## 2020-03-28 NOTE — Progress Notes (Signed)
Samuel Mcconnell - 87 y.o. male MRN [id number]  Date of birth: Aug 29, 1927  Subjective Chief Complaint  Patient presents with  . Follow-up    HPI Samuel Mcconnell is a 87 year old male here today for follow-up of leg and arm wounds.  Sutures were removed at last visit.  He had some serous exudate which has resolved at this point.  He still has some mild redness of the leg but this has improved.  He continues on Lasix to help with swelling and keep legs elevated at rest.  He denies any pain at this time.  ROS:  A comprehensive ROS was completed and negative except as noted per HPI  Allergies  Allergen Reactions  . Clindamycin/Lincomycin Other (See Comments)    Pt felt weird    . Dabigatran Etexilate Mesylate Other (See Comments)    "felt weird"  . Doxycycline Other (See Comments)    Dyspepsia    Past Medical History:  Diagnosis Date  . Atrial fibrillation ([facility])    radiofrequency ablation  . Emphysema   . Hypertension   . Thyroid disease     Past Surgical History:  Procedure Laterality Date  . CARDIAC SURGERY    . CARDIOVERSION N/A 10/24/2019   Procedure: CARDIOVERSION;  Surgeon: Thurmon Fair, MD;  Location: [facility];  Service: Cardiovascular;  Laterality: N/A;  . COLON SURGERY  1999  . KNEE ARTHROSCOPY Left 07/2000  . LOOP RECORDER IMPLANT  01/16/2010   Medtronic Reveal XT (Dr. Claudia Desanctis)   . RADIOFREQUENCY ABLATION  06/13/2009  . SHOULDER ARTHROSCOPY WITH ROTATOR CUFF REPAIR Left 04/1996  . TIBIAL TUBERCLERPLASTY  ~ 10/2010  . TRANSTHORACIC ECHOCARDIOGRAM  11/19/2011   EF=>55%; mild MR, mild mitral annular calcif; mild TR, normal RSVP; AV mildly sclerotic, mild AV regurg; trace pulm valve regurg     Social History   Socioeconomic History  . Marital status: Married    Spouse name: Not on file  . Number of children: 2  . Years of education: Not on file  . Highest education level: Not on file  Occupational History  . Occupation: Retired  Tobacco Use  . Smoking  status: Former Smoker    Years: 46.00    Quit date: 03/14/2002    Years since quitting: 18.0  . Smokeless tobacco: Never Used  . Tobacco comment: 40 pack year history   Vaping Use  . Vaping Use: Never used  Substance and Sexual Activity  . Alcohol use: No  . Drug use: No  . Sexual activity: Not Currently    Partners: Female  Other Topics Concern  . Not on file  Social History Narrative  . Not on file   Social Determinants of Health   Financial Resource Strain: Not on file  Food Insecurity: Not on file  Transportation Needs: Not on file  Physical Activity: Not on file  Stress: Not on file  Social Connections: Not on file    Family History  Problem Relation Age of Onset  . Heart failure Father        MI - died @ 81  . Heart failure Mother   . Diabetes Son   . Pancreatitis Son        also ETOH abuse   . Cancer Maternal Grandmother   . Heart disease Maternal Grandfather   . Atrial fibrillation Brother        dx'ed age 57  . Atrial fibrillation Brother        dx'ed age 58  . Heart failure  Brother   . Atrial fibrillation Sister   . Hypertension Sister   . Heart Problems Sister   . Diabetes Sister   . Heart failure Sister     Health Maintenance  Topic Date Due  . COVID-19 Vaccine (3 - Booster for Pfizer series) 11/29/2019  . TETANUS/TDAP  09/06/2029  . INFLUENZA VACCINE  Completed  . PNA vac Low Risk Adult  Completed     ----------------------------------------------------------------------------------------------------------------------------------------------------------------------------------------------------------------- Physical Exam BP (!) 145/73 (BP Location: Left Arm, Patient Position: Sitting, Cuff Size: Large)   Pulse 70   Wt 185 lb (83.9 kg)   SpO2 91%   BMI 25.80 kg/m   Physical Exam Constitutional:      Appearance: Normal appearance.  Eyes:     General: No scleral icterus. Musculoskeletal:     Cervical back: Neck supple.  Skin:     Comments: Wound to bilateral legs healing well.  Edges of wound on right leg debrided.  Hematoma improved to right forearm, wounds appear to be healing well  Neurological:     General: No focal deficit present.     Mental Status: He is alert.  Psychiatric:        Mood and Affect: Mood normal.        Behavior: Behavior normal.     ------------------------------------------------------------------------------------------------------------------------------------------------------------------------------------------------------------------- Assessment and Plan  Noninfected skin tear of lower extremity, left, initial encounter Left knee continues to heal well.  No signs of infection at this time.  Laceration of right forearm This continues to heal well.  Skin tear of upper extremity Skin tear is healing well.  Tissue remains viable.  Cellulitis Cellulitis appears to be resolved at this point.  Wound to right leg is healing well without any exudate.   No orders of the defined types were placed in this encounter.   No follow-ups on file.    This visit occurred during the SARS-CoV-2 public health emergency.  Safety protocols were in place, including screening questions prior to the visit, additional usage of staff PPE, and extensive cleaning of exam room while observing appropriate contact time as indicated for disinfecting solutions.

## 2020-04-11 ENCOUNTER — Telehealth: Payer: Self-pay | Admitting: Cardiovascular Disease

## 2020-04-11 ENCOUNTER — Other Ambulatory Visit: Payer: Self-pay | Admitting: Cardiovascular Disease

## 2020-04-11 DIAGNOSIS — I517 Cardiomegaly: Secondary | ICD-10-CM

## 2020-04-11 MED ORDER — FUROSEMIDE 20 MG PO TABS: 20.0000 mg | ORAL_TABLET | ORAL | 3 refills | Status: DC | PRN

## 2020-04-11 NOTE — Telephone Encounter (Signed)
°*  STAT* If patient is at the pharmacy, call can be transferred to refill team.   1. Which medications need to be refilled? (please list name of each medication and dose if known) furosemide (LASIX) 20 MG tablet  2. Which pharmacy/location (including street and city if local pharmacy) is medication to be sent to? [id number] - [place], [place] - [address]  3. Do they need a 30 day or 90 day supply? [place]

## 2020-04-11 NOTE — Telephone Encounter (Signed)
°*  STAT* If patient is at the pharmacy, call can be transferred to refill team.   1. Which medications need to be refilled? (please list name of each medication and dose if known) furosemide (LASIX) 20 MG tablet  2. Which pharmacy/location (including street and city if local pharmacy) is medication to be sent to? [place], [place]  3. Do they need a 30 day or 90 day supply? [place]

## 2020-04-17 ENCOUNTER — Telehealth: Payer: Self-pay | Admitting: Cardiovascular Disease

## 2020-04-17 DIAGNOSIS — I517 Cardiomegaly: Secondary | ICD-10-CM

## 2020-04-17 MED ORDER — FUROSEMIDE 20 MG PO TABS: 20.0000 mg | ORAL_TABLET | ORAL | 1 refills | Status: DC | PRN

## 2020-04-17 NOTE — Telephone Encounter (Signed)
New messasge      *STAT* If patient is at the pharmacy, call can be transferred to refill team.   1. Which medications need to be refilled? (please list name of each medication and dose if known)  furosemide (LASIX) 20 MG tablet  2. Which pharmacy/location (including street and city if local pharmacy) is medication to be sent to? [facility]   3. Do they need a 30 day or 90 day supply? [place]

## 2020-04-17 NOTE — Telephone Encounter (Signed)
Rx has been sent to the pharmacy electronically. ° °

## 2020-04-23 ENCOUNTER — Ambulatory Visit: Payer: Medicare HMO | Admitting: Family Medicine

## 2020-04-23 NOTE — Telephone Encounter (Signed)
*  STAT* If patient is at the pharmacy, call can be transferred to refill team.   1. Which medications need to be refilled? (please list name of each medication and dose if known) furosemide (LASIX) 20 MG tablet  2. Which pharmacy/location (including street and city if local pharmacy) is medication to be sent to? [place], [place]  3. Do they need a 30 day or 90 day supply? 90 day supply   Pharmacy called pt yesterday stating they have still not received prescription.

## 2020-04-25 DIAGNOSIS — H6123 Impacted cerumen, bilateral: Secondary | ICD-10-CM | POA: Diagnosis not present

## 2020-05-07 ENCOUNTER — Encounter: Payer: Self-pay | Admitting: Family Medicine

## 2020-05-07 ENCOUNTER — Other Ambulatory Visit: Payer: Self-pay

## 2020-05-07 ENCOUNTER — Ambulatory Visit (INDEPENDENT_AMBULATORY_CARE_PROVIDER_SITE_OTHER): Payer: Medicare HMO | Admitting: Family Medicine

## 2020-05-07 VITALS — BP 131/68 | HR 60 | Temp 97.9°F | Wt 183.0 lb

## 2020-05-07 DIAGNOSIS — I10 Essential (primary) hypertension: Secondary | ICD-10-CM | POA: Diagnosis not present

## 2020-05-07 DIAGNOSIS — Z0000 Encounter for general adult medical examination without abnormal findings: Secondary | ICD-10-CM | POA: Diagnosis not present

## 2020-05-07 DIAGNOSIS — E039 Hypothyroidism, unspecified: Secondary | ICD-10-CM | POA: Diagnosis not present

## 2020-05-07 DIAGNOSIS — I4891 Unspecified atrial fibrillation: Secondary | ICD-10-CM | POA: Diagnosis not present

## 2020-05-07 DIAGNOSIS — I517 Cardiomegaly: Secondary | ICD-10-CM

## 2020-05-07 MED ORDER — FUROSEMIDE 20 MG PO TABS: 20.0000 mg | ORAL_TABLET | ORAL | 1 refills | Status: DC | PRN

## 2020-05-07 NOTE — Progress Notes (Signed)
Ziad Maye - 87 y.o. male MRN [id number]  Date of birth: 17-Aug-1927  Subjective Chief Complaint  Patient presents with  . Annual Exam    HPI Julion Gatt is a 87 y.o. male here today for annual exam.  He has a history of PAF, HTN, COPD, CKD and hypothyroidism.    He had a fall a few months ago, wounds from this have healed well. No falls or trouble with balance since this accident.   He see cardiology for management of his A. Fib, stable at this time.   He walks some for exercise.  His diet is pretty good.   No bowel issues.  No memory issues.   He is up to date on recommended vaccines and screenings.   Review of Systems  Constitutional: Negative for chills, fever, malaise/fatigue and weight loss.  HENT: Negative for congestion, ear pain and sore throat.   Eyes: Negative for blurred vision, double vision and pain.  Respiratory: Negative for cough and shortness of breath.   Cardiovascular: Negative for chest pain and palpitations.  Gastrointestinal: Negative for abdominal pain, blood in stool, constipation, heartburn and nausea.  Genitourinary: Negative for dysuria and urgency.  Musculoskeletal: Negative for joint pain and myalgias.  Neurological: Negative for dizziness and headaches.  Endo/Heme/Allergies: Does not bruise/bleed easily.  Psychiatric/Behavioral: Negative for depression. The patient is not nervous/anxious and does not have insomnia.     Allergies  Allergen Reactions  . Clindamycin/Lincomycin Other (See Comments)    Pt felt weird    . Dabigatran Etexilate Mesylate Other (See Comments)    "felt weird"  . Doxycycline Other (See Comments)    Dyspepsia    Past Medical History:  Diagnosis Date  . Atrial fibrillation ([place])    radiofrequency ablation  . Emphysema   . Hypertension   . Thyroid disease     Past Surgical History:  Procedure Laterality Date  . CARDIAC SURGERY    . CARDIOVERSION N/A 10/24/2019   Procedure: CARDIOVERSION;  Surgeon: Sanda Klein, MD;  Location: [facility];  Service: Cardiovascular;  Laterality: N/A;  . [place]  . KNEE ARTHROSCOPY Left 07/2000  . LOOP RECORDER IMPLANT  01/16/2010   Medtronic Reveal XT (Dr. Norlene Duel)   . RADIOFREQUENCY ABLATION  06/13/2009  . SHOULDER ARTHROSCOPY WITH ROTATOR CUFF REPAIR Left 04/1996  . TIBIAL TUBERCLERPLASTY  ~ 10/2010  . TRANSTHORACIC ECHOCARDIOGRAM  11/19/2011   EF=>55%; mild MR, mild mitral annular calcif; mild TR, normal RSVP; AV mildly sclerotic, mild AV regurg; trace pulm valve regurg     Social History   Socioeconomic History  . Marital status: Married    Spouse name: Not on file  . Number of children: 2  . Years of education: Not on file  . Highest education level: Not on file  Occupational History  . Occupation: Retired  Tobacco Use  . Smoking status: Former Smoker    Years: 46.00    Quit date: 03/14/2002    Years since quitting: 18.1  . Smokeless tobacco: Never Used  . Tobacco comment: 40 pack year history   Vaping Use  . Vaping Use: Never used  Substance and Sexual Activity  . Alcohol use: No  . Drug use: No  . Sexual activity: Not Currently    Partners: Female  Other Topics Concern  . Not on file  Social History Narrative  . Not on file   Social Determinants of Health   Financial Resource Strain: Not on file  Food Insecurity:  Not on file  Transportation Needs: Not on file  Physical Activity: Not on file  Stress: Not on file  Social Connections: Not on file    Family History  Problem Relation Age of Onset  . Heart failure Father        MI - died @ 35  . Heart failure Mother   . Diabetes Son   . Pancreatitis Son        also ETOH abuse   . Cancer Maternal Grandmother   . Heart disease Maternal Grandfather   . Atrial fibrillation Brother        dx'ed age 43  . Atrial fibrillation Brother        dx'ed age 48  . Heart failure Brother   . Atrial fibrillation Sister   . Hypertension Sister   . Heart Problems Sister   .  Diabetes Sister   . Heart failure Sister     Health Maintenance  Topic Date Due  . Samul Dada  09/06/2029  . INFLUENZA VACCINE  Completed  . COVID-19 Vaccine  Completed  . PNA vac Low Risk Adult  Completed     ----------------------------------------------------------------------------------------------------------------------------------------------------------------------------------------------------------------- Physical Exam BP 131/68 (BP Location: Left Arm, Patient Position: Sitting, Cuff Size: Normal)   Pulse 60   Temp 97.9 F (36.6 C)   Wt 183 lb (83 kg)   SpO2 98%   BMI 25.52 kg/m   Physical Exam Constitutional:      General: He is not in acute distress.    Appearance: He is well-nourished.  HENT:     Head: Normocephalic and atraumatic.     Right Ear: Tympanic membrane and external ear normal.     Left Ear: Tympanic membrane and external ear normal.     Mouth/Throat:     Mouth: Oropharynx is clear and moist.  Eyes:     General: No scleral icterus. Neck:     Thyroid: No thyromegaly.  Cardiovascular:     Rate and Rhythm: Normal rate and regular rhythm.     Pulses: Intact distal pulses.     Heart sounds: Normal heart sounds.  Pulmonary:     Effort: Pulmonary effort is normal.     Breath sounds: Normal breath sounds.  Abdominal:     General: Bowel sounds are normal. There is no distension.     Palpations: Abdomen is soft.     Tenderness: There is no abdominal tenderness. There is no guarding.  Musculoskeletal:        General: No edema.     Cervical back: Normal range of motion.  Lymphadenopathy:     Cervical: No cervical adenopathy.  Skin:    General: Skin is warm and dry.     Findings: No rash.  Neurological:     Mental Status: He is alert and oriented to person, place, and time.     Cranial Nerves: No cranial nerve deficit.     Motor: No abnormal muscle tone.  Psychiatric:        Mood and Affect: Mood and affect normal.        Behavior: Behavior  normal.     ------------------------------------------------------------------------------------------------------------------------------------------------------------------------------------------------------------------- Assessment and Plan  Well adult exam Well adult Orders Placed This Encounter  Procedures  . COMPLETE METABOLIC PANEL WITH GFR  . CBC  . TSH  Chronic conditions stable.  Immunizations: UTD Screenings: UTD Anticipatory guidance/Risk factor reduction:  Recommendations per AVS.    Meds ordered this encounter  Medications  . furosemide (LASIX) 20 MG tablet  Sig: Take 1 tablet (20 mg total) by mouth as needed for edema.    Dispense:  90 tablet    Refill:  1    Return in about 6 months (around 11/04/2020) for Hypothyroid.    This visit occurred during the SARS-CoV-2 public health emergency.  Safety protocols were in place, including screening questions prior to the visit, additional usage of staff PPE, and extensive cleaning of exam room while observing appropriate contact time as indicated for disinfecting solutions.

## 2020-05-07 NOTE — Assessment & Plan Note (Signed)
Well adult Orders Placed This Encounter  Procedures  . COMPLETE METABOLIC PANEL WITH GFR  . CBC  . TSH  Chronic conditions stable.  Immunizations: UTD Screenings: UTD Anticipatory guidance/Risk factor reduction:  Recommendations per AVS.

## 2020-05-07 NOTE — Patient Instructions (Signed)
Preventive Care 65 Years and Older, Male Preventive care refers to lifestyle choices and visits with your health care provider that can promote health and wellness. This includes:  A yearly physical exam. This is also called an annual wellness visit.  Regular dental and eye exams.  Immunizations.  Screening for certain conditions.  Healthy lifestyle choices, such as: ? Eating a healthy diet. ? Getting regular exercise. ? Not using drugs or products that contain nicotine and tobacco. ? Limiting alcohol use. What can I expect for my preventive care visit? Physical exam Your health care provider will check your:  Height and weight. These may be used to calculate your BMI (body mass index). BMI is a measurement that tells if you are at a healthy weight.  Heart rate and blood pressure.  Body temperature.  Skin for abnormal spots. Counseling Your health care provider may ask you questions about your:  Past medical problems.  Family's medical history.  Alcohol, tobacco, and drug use.  Emotional well-being.  Home life and relationship well-being.  Sexual activity.  Diet, exercise, and sleep habits.  History of falls.  Memory and ability to understand (cognition).  Work and work environment.  Access to firearms. What immunizations do I need? Vaccines are usually given at various ages, according to a schedule. Your health care provider will recommend vaccines for you based on your age, medical history, and lifestyle or other factors, such as travel or where you work.   What tests do I need? Blood tests  Lipid and cholesterol levels. These may be checked every 5 years, or more often depending on your overall health.  Hepatitis C test.  Hepatitis B test. Screening  Lung cancer screening. You may have this screening every year starting at age 55 if you have a 30-pack-year history of smoking and currently smoke or have quit within the past 15 years.  Colorectal  cancer screening. ? All adults should have this screening starting at age 50 and continuing until age 75. ? Your health care provider may recommend screening at age 45 if you are at increased risk. ? You will have tests every 1-10 years, depending on your results and the type of screening test.  Prostate cancer screening. Recommendations will vary depending on your family history and other risks.  Genital exam to check for testicular cancer or hernias.  Diabetes screening. ? This is done by checking your blood sugar (glucose) after you have not eaten for a while (fasting). ? You may have this done every 1-3 years.  Abdominal aortic aneurysm (AAA) screening. You may need this if you are a current or former smoker.  STD (sexually transmitted disease) testing, if you are at risk. Follow these instructions at home: Eating and drinking  Eat a diet that includes fresh fruits and vegetables, whole grains, lean protein, and low-fat dairy products. Limit your intake of foods with high amounts of sugar, saturated fats, and salt.  Take vitamin and mineral supplements as recommended by your health care provider.  Do not drink alcohol if your health care provider tells you not to drink.  If you drink alcohol: ? Limit how much you have to 0-2 drinks a day. ? Be aware of how much alcohol is in your drink. In the [place] one drink equals one 12 oz bottle of beer (355 mL), one 5 oz glass of wine (148 mL), or one 1 oz glass of hard liquor (44 mL).   Lifestyle  Take daily care of your teeth   and gums. Brush your teeth every morning and night with fluoride toothpaste. Floss one time each day.  Stay active. Exercise for at least 30 minutes 5 or more days each week.  Do not use any products that contain nicotine or tobacco, such as cigarettes, e-cigarettes, and chewing tobacco. If you need help quitting, ask your health care provider.  Do not use drugs.  If you are sexually active, practice safe sex.  Use a condom or other form of protection to prevent STIs (sexually transmitted infections).  Talk with your health care provider about taking a low-dose aspirin or statin.  Find healthy ways to cope with stress, such as: ? Meditation, yoga, or listening to music. ? Journaling. ? Talking to a trusted person. ? Spending time with friends and family. Safety  Always wear your seat belt while driving or riding in a vehicle.  Do not drive: ? If you have been drinking alcohol. Do not ride with someone who has been drinking. ? When you are tired or distracted. ? While texting.  Wear a helmet and other protective equipment during sports activities.  If you have firearms in your house, make sure you follow all gun safety procedures. What's next?  Visit your health care provider once a year for an annual wellness visit.  Ask your health care provider how often you should have your eyes and teeth checked.  Stay up to date on all vaccines. This information is not intended to replace advice given to you by your health care provider. Make sure you discuss any questions you have with your health care provider. Document Revised: 12/06/2018 Document Reviewed: 03/03/2018 Elsevier Patient Education  2021 Elsevier Inc.  

## 2020-05-08 ENCOUNTER — Other Ambulatory Visit: Payer: Self-pay | Admitting: Family Medicine

## 2020-05-08 DIAGNOSIS — E039 Hypothyroidism, unspecified: Secondary | ICD-10-CM

## 2020-05-08 LAB — CBC
HCT: 40.2 % (ref 38.5–50.0)
Hemoglobin: 13 g/dL — ABNORMAL LOW (ref 13.2–17.1)
MCH: 29.5 pg (ref 27.0–33.0)
MCHC: 32.3 g/dL (ref 32.0–36.0)
MCV: 91.4 fL (ref 80.0–100.0)
MPV: 10.6 fL (ref 7.5–12.5)
Platelets: 284 10*3/uL (ref 140–400)
RBC: 4.4 10*6/uL (ref 4.20–5.80)
RDW: 12.9 % (ref 11.0–15.0)
WBC: 8.4 10*3/uL (ref 3.8–10.8)

## 2020-05-08 LAB — COMPLETE METABOLIC PANEL WITH GFR
AG Ratio: 2 (calc) (ref 1.0–2.5)
ALT: 25 U/L (ref 9–46)
AST: 29 U/L (ref 10–35)
Albumin: 4.3 g/dL (ref 3.6–5.1)
Alkaline phosphatase (APISO): 58 U/L (ref 35–144)
BUN/Creatinine Ratio: 22 (calc) (ref 6–22)
BUN: 35 mg/dL — ABNORMAL HIGH (ref 7–25)
CO2: 32 mmol/L (ref 20–32)
Calcium: 9.9 mg/dL (ref 8.6–10.3)
Chloride: 101 mmol/L (ref 98–110)
Creat: 1.57 mg/dL — ABNORMAL HIGH (ref 0.70–1.11)
GFR, Est African American: 44 mL/min/{1.73_m2} — ABNORMAL LOW (ref >=60)
GFR, Est Non African American: 38 mL/min/{1.73_m2} — ABNORMAL LOW (ref >=60)
Globulin: 2.2 g/dL (calc) (ref 1.9–3.7)
Glucose, Bld: 87 mg/dL (ref 65–139)
Potassium: 5.1 mmol/L (ref 3.5–5.3)
Sodium: 139 mmol/L (ref 135–146)
Total Bilirubin: 0.4 mg/dL (ref 0.2–1.2)
Total Protein: 6.5 g/dL (ref 6.1–8.1)

## 2020-05-08 LAB — TSH: TSH: 6.96 mIU/L — ABNORMAL HIGH (ref 0.40–4.50)

## 2020-05-08 MED ORDER — LEVOTHYROXINE SODIUM 137 MCG PO TABS
125.0000 ug | ORAL_TABLET | Freq: Every day | ORAL | 1 refills | Status: DC
Start: 2020-05-08 — End: 2020-10-02

## 2020-05-10 ENCOUNTER — Other Ambulatory Visit: Payer: Self-pay

## 2020-05-10 DIAGNOSIS — I517 Cardiomegaly: Secondary | ICD-10-CM

## 2020-05-10 MED ORDER — FUROSEMIDE 20 MG PO TABS: 20.0000 mg | ORAL_TABLET | Freq: Every day | ORAL | 1 refills | Status: DC | PRN

## 2020-06-10 ENCOUNTER — Other Ambulatory Visit: Payer: Self-pay | Admitting: Family Medicine

## 2020-06-17 DIAGNOSIS — Z20828 Contact with and (suspected) exposure to other viral communicable diseases: Secondary | ICD-10-CM | POA: Diagnosis not present

## 2020-06-19 DIAGNOSIS — E039 Hypothyroidism, unspecified: Secondary | ICD-10-CM | POA: Diagnosis not present

## 2020-06-20 LAB — TSH: TSH: 3.82 mIU/L (ref 0.40–4.50)

## 2020-06-24 DIAGNOSIS — Z20828 Contact with and (suspected) exposure to other viral communicable diseases: Secondary | ICD-10-CM | POA: Diagnosis not present

## 2020-07-01 DIAGNOSIS — Z20828 Contact with and (suspected) exposure to other viral communicable diseases: Secondary | ICD-10-CM | POA: Diagnosis not present

## 2020-07-08 DIAGNOSIS — Z20828 Contact with and (suspected) exposure to other viral communicable diseases: Secondary | ICD-10-CM | POA: Diagnosis not present

## 2020-07-11 ENCOUNTER — Other Ambulatory Visit: Payer: Self-pay

## 2020-07-11 MED ORDER — FLUTICASONE-SALMETEROL 250-50 MCG/DOSE IN AEPB: 1.0000 | INHALATION_SPRAY | Freq: Two times a day (BID) | RESPIRATORY_TRACT | 1 refills | Status: DC

## 2020-07-15 DIAGNOSIS — Z20828 Contact with and (suspected) exposure to other viral communicable diseases: Secondary | ICD-10-CM | POA: Diagnosis not present

## 2020-07-22 DIAGNOSIS — Z20828 Contact with and (suspected) exposure to other viral communicable diseases: Secondary | ICD-10-CM | POA: Diagnosis not present

## 2020-07-23 DIAGNOSIS — L821 Other seborrheic keratosis: Secondary | ICD-10-CM | POA: Diagnosis not present

## 2020-07-23 DIAGNOSIS — L570 Actinic keratosis: Secondary | ICD-10-CM | POA: Diagnosis not present

## 2020-07-29 DIAGNOSIS — Z20828 Contact with and (suspected) exposure to other viral communicable diseases: Secondary | ICD-10-CM | POA: Diagnosis not present

## 2020-08-05 DIAGNOSIS — Z20828 Contact with and (suspected) exposure to other viral communicable diseases: Secondary | ICD-10-CM | POA: Diagnosis not present

## 2020-08-12 DIAGNOSIS — Z20828 Contact with and (suspected) exposure to other viral communicable diseases: Secondary | ICD-10-CM | POA: Diagnosis not present

## 2020-08-19 DIAGNOSIS — Z20828 Contact with and (suspected) exposure to other viral communicable diseases: Secondary | ICD-10-CM | POA: Diagnosis not present

## 2020-08-26 ENCOUNTER — Telehealth: Payer: Self-pay | Admitting: Emergency Medicine

## 2020-08-26 ENCOUNTER — Emergency Department (INDEPENDENT_AMBULATORY_CARE_PROVIDER_SITE_OTHER): Payer: Medicare HMO

## 2020-08-26 ENCOUNTER — Encounter: Payer: Self-pay | Admitting: Emergency Medicine

## 2020-08-26 ENCOUNTER — Other Ambulatory Visit: Payer: Self-pay

## 2020-08-26 ENCOUNTER — Emergency Department
Admission: EM | Admit: 2020-08-26 | Discharge: 2020-08-26 | Disposition: A | Discharge status: Home / Self Care | Payer: Medicare HMO | Source: Home / Self Care

## 2020-08-26 DIAGNOSIS — J0100 Acute maxillary sinusitis, unspecified: Secondary | ICD-10-CM

## 2020-08-26 DIAGNOSIS — R5383 Other fatigue: Secondary | ICD-10-CM | POA: Diagnosis not present

## 2020-08-26 DIAGNOSIS — R059 Cough, unspecified: Secondary | ICD-10-CM | POA: Diagnosis not present

## 2020-08-26 DIAGNOSIS — J309 Allergic rhinitis, unspecified: Secondary | ICD-10-CM

## 2020-08-26 DIAGNOSIS — Z20828 Contact with and (suspected) exposure to other viral communicable diseases: Secondary | ICD-10-CM | POA: Diagnosis not present

## 2020-08-26 MED ORDER — METHYLPREDNISOLONE ACETATE 80 MG/ML IJ SUSP
80.0000 mg | Freq: Once | INTRAMUSCULAR | Status: AC
  Administered 2020-08-26: 80 mg via INTRAMUSCULAR

## 2020-08-26 MED ORDER — BENZONATATE 200 MG PO CAPS: 200.0000 mg | ORAL_CAPSULE | Freq: Three times a day (TID) | ORAL | 0 refills | Status: AC | PRN

## 2020-08-26 MED ORDER — CEFDINIR 300 MG PO CAPS: 300.0000 mg | ORAL_CAPSULE | Freq: Two times a day (BID) | ORAL | 0 refills | Status: AC

## 2020-08-26 NOTE — ED Triage Notes (Signed)
Patient here day 3 of congestion, mild cough and extreme fatigue; uses walker and has daughter with him. He has had all covid vaccinations, pneumonia. Generally gets bronchitis annually, and historically was hospitalized for pneumonia.

## 2020-08-26 NOTE — Discharge Instructions (Signed)
Instructed patient to take medication as directed with food to completion.  Encourage patient to take Allegra 180 mg daily for the next 5 to 7 days, then as needed.

## 2020-08-26 NOTE — Telephone Encounter (Signed)
Called in [place] to [facility], they wanted it to be local.

## 2020-08-26 NOTE — ED Provider Notes (Signed)
[facility]    CSN: [id number] Arrival date & time: 08/26/20  1235      History   Chief Complaint Chief Complaint  Patient presents with  . Fatigue  . Nasal Congestion    HPI Daiden Coltrane is a 87 y.o. male.   HPI 87 year old male presents with 3 days of congestion, mild cough and extreme fatigue.  Patient ambulates using rolling walker and is accompanied by his daughter Hassan Rowan) this afternoon.  Patient is vaccinated for COVID-19.  Resides in assisted living and is tested for COVID-19 every Monday.  All previous COVID-19 PCR test have been negative, this morning's COVID-19 swab has been sent out for evaluation. PMH significant for paroxysmal atrial fibrillation, COPD, LVH, and HTN.  Past Medical History:  Diagnosis Date  . Atrial fibrillation ([place])    radiofrequency ablation  . Emphysema   . Hypertension   . Thyroid disease     Patient Active Problem List   Diagnosis Date Noted  . Well adult exam 05/07/2020  . Laceration of right forearm 03/13/2020  . Cellulitis 03/06/2020  . Skin tear of upper extremity 03/06/2020  . Noninfected skin tear of lower extremity, left, initial encounter 03/06/2020  . Gait instability 11/26/2019  . Hypothyroid 11/26/2019  . Atypical atrial flutter ([place])   . Fall from slip, trip, or stumble, initial encounter 09/08/2019  . Bilateral lower extremity edema 09/08/2019  . 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 Chronic obstructive pulmonary disease ([place]) 09/14/2017  . Essential hypertension 09/14/2017  . Myringotomy tube status 02/25/2017  . LVH (left ventricular hypertrophy) 03/28/2016  . Gallbladder calculus without cholecystitis 07/22/2014  . Fatty liver 07/22/2014  . PAF (paroxysmal atrial fibrillation) ([place]) 03/15/2013  . CKD (chronic kidney disease) stage 3, GFR 30-59 ml/min ([facility]) 02/21/2013  . Allergic rhinitis 06/24/2012    Past Surgical History:  Procedure Laterality Date  . CARDIAC SURGERY    . CARDIOVERSION N/A  10/24/2019   Procedure: CARDIOVERSION;  Surgeon: Sanda Klein, MD;  Location: [facility];  Service: Cardiovascular;  Laterality: N/A;  . [place]  . KNEE ARTHROSCOPY Left 07/2000  . LOOP RECORDER IMPLANT  01/16/2010   Medtronic Reveal XT (Dr. Norlene Duel)   . RADIOFREQUENCY ABLATION  06/13/2009  . SHOULDER ARTHROSCOPY WITH ROTATOR CUFF REPAIR Left 04/1996  . TIBIAL TUBERCLERPLASTY  ~ 10/2010  . TRANSTHORACIC ECHOCARDIOGRAM  11/19/2011   EF=>55%; mild MR, mild mitral annular calcif; mild TR, normal RSVP; AV mildly sclerotic, mild AV regurg; trace pulm valve regurg        Home Medications    Prior to Admission medications   Medication Sig Start Date End Date Taking? Authorizing Provider  benzonatate (TESSALON) 200 MG capsule Take 1 capsule (200 mg total) by mouth 3 (three) times daily as needed for up to 7 days for cough. 08/26/20 09/02/20 Yes Eliezer Lofts, FNP  cefdinir (OMNICEF) 300 MG capsule Take 1 capsule (300 mg total) by mouth 2 (two) times daily for 7 days. 08/26/20 09/02/20 Yes Eliezer Lofts, FNP  amiodarone (PACERONE) 200 MG tablet Take 1 tablet (200 mg total) by mouth daily. 03/25/20 03/20/21  Croitoru, Mihai, MD  apixaban (ELIQUIS) 2.5 MG TABS tablet Take 1 tablet (2.5 mg total) by mouth 2 (two) times daily. 03/21/20 03/16/21  Croitoru, Mihai, MD  Ascorbic Acid (VITAMIN C) 1000 MG tablet Take 1,000 mg by mouth daily.    [provider]  Fluticasone-Salmeterol (ADVAIR) 250-50 MCG/DOSE AEPB Inhale 1 puff into the lungs 2 (two) times  daily. 07/11/20   Croitoru, Dani Gobble, MD  furosemide (LASIX) 20 MG tablet Take 1 tablet (20 mg total) by mouth daily as needed for edema. 05/10/20 08/08/20  Luetta Nutting, DO  levothyroxine (SYNTHROID) 137 MCG tablet Take 1 tablet (137 mcg total) by mouth daily before breakfast. 05/08/20   Luetta Nutting, DO  losartan (COZAAR) 50 MG tablet Take 1 tablet (50 mg total) by mouth daily. 03/25/20 09/21/20  Croitoru, Mihai, MD  Multiple Vitamin  (MULTIVITAMIN) capsule Take 1 capsule by mouth daily.    [provider]  VITAMIN D PO Take by mouth.    [provider]  Zinc 50 MG TABS Take 50 mg by mouth.    [provider]    Family History Family History  Problem Relation Age of Onset  . Heart failure Father        MI - died @ 54  . Heart failure Mother   . Diabetes Son   . Pancreatitis Son        also ETOH abuse   . Cancer Maternal Grandmother   . Heart disease Maternal Grandfather   . Atrial fibrillation Brother        dx'ed age 89  . Atrial fibrillation Brother        dx'ed age 99  . Heart failure Brother   . Atrial fibrillation Sister   . Hypertension Sister   . Heart Problems Sister   . Diabetes Sister   . Heart failure Sister     Social History Social History   Tobacco Use  . Smoking status: Former Smoker    Years: 46.00    Quit date: 03/14/2002    Years since quitting: 18.4  . Smokeless tobacco: Never Used  . Tobacco comment: 40 pack year history   Vaping Use  . Vaping Use: Never used  Substance Use Topics  . Alcohol use: No  . Drug use: No     Allergies   Clindamycin/lincomycin, Dabigatran etexilate mesylate, and Doxycycline   Review of Systems Review of Systems  Constitutional: Positive for fatigue.  HENT: Positive for congestion.   Eyes: Negative.   Respiratory: Positive for cough.   Cardiovascular: Negative.   Gastrointestinal: Negative.   Genitourinary: Negative.   Musculoskeletal: Negative.   Skin: Negative.   Neurological: Negative.      Physical Exam Triage Vital Signs ED Triage Vitals  Enc Vitals Group     BP 08/26/20 1347 (!) 94/54     Pulse Rate 08/26/20 1347 86     Resp 08/26/20 1347 16     Temp 08/26/20 1347 98.5 F (36.9 C)     Temp Source 08/26/20 1347 Oral     SpO2 08/26/20 1347 96 %     Weight 08/26/20 1338 188 lb (85.3 kg)     Height 08/26/20 1338 5\' 11"  (1.803 m)     Head Circumference --      Peak Flow --      Pain Score  08/26/20 1338 0     Pain Loc --      Pain Edu? --      Excl. in [place]? --    No data found.  Updated Vital Signs BP (!) 94/54 (BP Location: Right Arm)   Pulse 86   Temp 98.5 F (36.9 C) (Oral)   Resp 16   Ht 5\' 11"  (1.803 m)   Wt 188 lb (85.3 kg)   SpO2 96%   BMI 26.22 kg/m      Physical  Exam Vitals and nursing note reviewed.  Constitutional:      General: He is not in acute distress.    Appearance: Normal appearance. He is ill-appearing.  HENT:     Head: Normocephalic and atraumatic.     Right Ear: Tympanic membrane and ear canal normal.     Left Ear: Tympanic membrane and ear canal normal.     Nose:     Comments: Turbinates are erythematous    Mouth/Throat:     Mouth: Mucous membranes are moist.     Pharynx: Oropharynx is clear. No oropharyngeal exudate or posterior oropharyngeal erythema.     Comments: Moderate amount of clear drainage of posterior oropharynx noted Eyes:     Extraocular Movements: Extraocular movements intact.     Conjunctiva/sclera: Conjunctivae normal.     Pupils: Pupils are equal, round, and reactive to light.  Cardiovascular:     Rate and Rhythm: Normal rate and regular rhythm.     Pulses: Normal pulses.     Heart sounds: Normal heart sounds.  Pulmonary:     Effort: Pulmonary effort is normal.     Comments: Tightness of upper airways, mild expiratory wheeze noted throughout, no adventitious breath sounds noted Musculoskeletal:        General: Normal range of motion.     Cervical back: Normal range of motion and neck supple. No tenderness.  Lymphadenopathy:     Cervical: No cervical adenopathy.  Skin:    General: Skin is warm and dry.  Neurological:     General: No focal deficit present.     Mental Status: He is oriented to person, place, and time.  Psychiatric:        Mood and Affect: Mood normal.        Behavior: Behavior normal.      UC Treatments / Results  Labs (all labs ordered are listed, but only abnormal results are  displayed) Labs Reviewed - No data to display  EKG   Radiology DG Chest 2 View  Result Date: 08/26/2020 CLINICAL DATA:  Cough EXAM: CHEST - 2 VIEW COMPARISON:  02/29/2020 FINDINGS: Is no new consolidation or edema. Calcified granulomas again noted in the left mid lung. No pleural effusion. Stable cardiomediastinal contours with normal heart size. IMPRESSION: No acute process in the chest. Electronically Signed   By: Macy Mis M.D.   On: 08/26/2020 15:36    Procedures Procedures (including critical care time)  Medications Ordered in UC Medications  methylPREDNISolone acetate (DEPO-MEDROL) injection 80 mg (80 mg Intramuscular Given 08/26/20 1557)    Initial Impression / Assessment and Plan / UC Course  I have reviewed the triage vital signs and the nursing notes.  Pertinent labs & imaging results that were available during my care of the patient were reviewed by me and considered in my medical decision making (see chart for details).     MDM: 1. Cough. 2.  Subacute maxillary sinusitis, 3.  Allergic rhinitis. Final Clinical Impressions(s) / UC Diagnoses   Final diagnoses:  Fatigue, unspecified type  Cough  Subacute maxillary sinusitis  Allergic rhinitis, unspecified seasonality, unspecified trigger     Discharge Instructions     Instructed patient to take medication as directed with food to completion.  Encourage patient to take Allegra 180 mg daily for the next 5 to 7 days, then as needed.    ED Prescriptions    Medication Sig Dispense Auth. Provider   benzonatate (TESSALON) 200 MG capsule Take 1 capsule (200 mg total) by  mouth 3 (three) times daily as needed for up to 7 days for cough. 30 capsule Eliezer Lofts, FNP   cefdinir (OMNICEF) 300 MG capsule Take 1 capsule (300 mg total) by mouth 2 (two) times daily for 7 days. 14 capsule Eliezer Lofts, FNP     PDMP not reviewed this encounter.   Eliezer Lofts, [place] 08/26/20 1605

## 2020-08-29 DIAGNOSIS — Z20828 Contact with and (suspected) exposure to other viral communicable diseases: Secondary | ICD-10-CM | POA: Diagnosis not present

## 2020-09-02 DIAGNOSIS — Z20828 Contact with and (suspected) exposure to other viral communicable diseases: Secondary | ICD-10-CM | POA: Diagnosis not present

## 2020-09-16 ENCOUNTER — Ambulatory Visit (INDEPENDENT_AMBULATORY_CARE_PROVIDER_SITE_OTHER): Payer: Medicare HMO | Admitting: Family Medicine

## 2020-09-16 DIAGNOSIS — Z0000 Encounter for general adult medical examination without abnormal findings: Secondary | ICD-10-CM | POA: Diagnosis not present

## 2020-09-16 NOTE — Progress Notes (Signed)
MEDICARE ANNUAL WELLNESS VISIT  09/16/2020  Telephone Visit Disclaimer This Medicare AWV was conducted by telephone due to national recommendations for restrictions regarding the COVID-19 Pandemic (e.g. social distancing).  I verified, using two identifiers, that I am speaking with Samuel Mcconnell or their authorized healthcare agent. I discussed the limitations, risks, security, and privacy concerns of performing an evaluation and management service by telephone and the potential availability of an in-person appointment in the future. The patient expressed understanding and agreed to proceed.  Location of Patient: Home Location of Provider (nurse):  Provider home  Subjective:    Samuel Mcconnell is a 87 y.o. male patient of Samuel Nutting, DO who had a Medicare Annual Wellness Visit today via telephone. Samuel Mcconnell is Retired and lives with their spouse at [employer] in [place]. he has 2 children. he reports that he is socially active and does interact with friends/family regularly. he is moderately physically active and enjoys golfing and gardening (when he is able to).  Patient Care Team: Samuel Nutting, DO as PCP - General (Family Medicine) Sanda Klein, MD as PCP - Cardiology (Cardiology)  Advanced Directives 09/16/2020 11/22/2019  Does Patient Have a Medical Advance Directive? Yes Yes  Type of Advance Directive Living will;Healthcare Power of Attorney Living will;Healthcare Power of Attorney  Does patient want to make changes to medical advance directive? No - Patient declined No - Patient declined  Copy of [place] in Chart? No - copy requested [facility] Utilization Over the Past 12 Months: # of hospitalizations or ER visits: 2 # of surgeries: 0  Review of Systems    Patient reports that his overall health is better compared to last year.  History obtained from chart review and the patient  Patient Reported Readings (BP, Pulse, CBG, Weight,  etc) none  Pain Assessment Pain : No/denies pain Pain Score: 0-No pain     Current Medications & Allergies (verified) Allergies as of 09/16/2020       Reactions   Clindamycin/lincomycin Other (See Comments)   Pt felt weird    Dabigatran Etexilate Mesylate Other (See Comments)   "felt weird"   Doxycycline Other (See Comments)   Dyspepsia        Medication List        Accurate as of September 16, 2020 10:31 AM. If you have any questions, ask your nurse or doctor.          amiodarone 200 MG tablet Commonly known as: PACERONE Take 1 tablet (200 mg total) by mouth daily.   apixaban 2.5 MG Tabs tablet Commonly known as: Eliquis Take 1 tablet (2.5 mg total) by mouth 2 (two) times daily.   fluticasone 50 MCG/ACT nasal spray Commonly known as: FLONASE Place 1 spray into both nostrils daily.   Fluticasone-Salmeterol 250-50 MCG/DOSE Aepb Commonly known as: ADVAIR Inhale 1 puff into the lungs 2 (two) times daily.   furosemide 20 MG tablet Commonly known as: LASIX Take 1 tablet (20 mg total) by mouth daily as needed for edema.   levothyroxine 137 MCG tablet Commonly known as: SYNTHROID Take 1 tablet (137 mcg total) by mouth daily before breakfast.   losartan 50 MG tablet Commonly known as: COZAAR Take 1 tablet (50 mg total) by mouth daily.   multivitamin capsule Take 1 capsule by mouth daily.   vitamin C 1000 MG tablet Take 1,000 mg by mouth daily.   VITAMIN D PO Take by mouth.   Zinc 50 MG Tabs  Take 50 mg by mouth.        History (reviewed): Past Medical History:  Diagnosis Date   Atrial fibrillation ([place])    radiofrequency ablation   Emphysema    Hypertension    Thyroid disease    Past Surgical History:  Procedure Laterality Date   CARDIAC SURGERY     CARDIOVERSION N/A 10/24/2019   Procedure: CARDIOVERSION;  Surgeon: Sanda Klein, MD;  Location: [facility];  Service: Cardiovascular;  Laterality: N/A;   [place]  ARTHROSCOPY Left 07/2000   LOOP RECORDER IMPLANT  01/16/2010   Medtronic Reveal XT (Dr. Norlene Duel)    RADIOFREQUENCY ABLATION  06/13/2009   SHOULDER ARTHROSCOPY WITH ROTATOR CUFF REPAIR Left 04/1996   TIBIAL TUBERCLERPLASTY  ~ 10/2010   TRANSTHORACIC ECHOCARDIOGRAM  11/19/2011   EF=>55%; mild MR, mild mitral annular calcif; mild TR, normal RSVP; AV mildly sclerotic, mild AV regurg; trace pulm valve regurg    Family History  Problem Relation Age of Onset   Heart failure Father        MI - died @ 78   Heart failure Mother    Diabetes Son    Pancreatitis Son        also ETOH abuse    Cancer Maternal Grandmother    Heart disease Maternal Grandfather    Atrial fibrillation Brother        dx'ed age 12   Atrial fibrillation Brother        dx'ed age 55   Heart failure Brother    Atrial fibrillation Sister    Hypertension Sister    Heart Problems Sister    Diabetes Sister    Heart failure Sister    Social History   Socioeconomic History   Marital status: Married    Spouse name: Barnett Applebaum   Number of children: 2   Years of education: 16   Highest education level: Bachelor's degree (e.g., BA, AB, BS)  Occupational History   Occupation: Retired  Tobacco Use   Smoking status: Former    Years: 46.00    Pack years: 0.00    Types: Cigarettes    Quit date: 03/14/2002    Years since quitting: 18.5   Smokeless tobacco: Never   Tobacco comments:    40 pack year history   Vaping Use   Vaping Use: Never used  Substance and Sexual Activity   Alcohol use: No   Drug use: No   Sexual activity: Not Currently    Partners: Female  Other Topics Concern   Not on file  Social History Narrative   Lives at [employer] [facility] with his wife. He is still driving. He enjoys golf and gardening (when is he is able to).   Social Determinants of Health   Financial Resource Strain: Low Risk    Difficulty of Paying Living Expenses: Not hard at all  Food Insecurity: No Food Insecurity   Worried About  Charity fundraiser in the Last Year: Never true   [place] in the Last Year: Never true  Transportation Needs: No Transportation Needs   Lack of Transportation (Medical): No   Lack of Transportation (Non-Medical): No  Physical Activity: Sufficiently Active   Days of Exercise per Week: 4 days   Minutes of Exercise per Session: 50 min  Stress: No Stress Concern Present   Feeling of Stress : Not at all  Social Connections: Socially Integrated   Frequency of Communication with Friends and Family: More  than three times a week   Frequency of Social Gatherings with Friends and Family: More than three times a week   Attends Religious Services: More than 4 times per year   Active Member of [employer] or Organizations: Yes   Attends Archivist Meetings: Never   Marital Status: Married    Activities of Daily Living In your present state of health, do you have any difficulty performing the following activities: 09/16/2020  Hearing? Y  Comment he has difficulty hearing and has hearing aids. he doesn't use them often.  Vision? Y  Comment hasn't had an eye exam in 2 years.  Difficulty concentrating or making decisions? N  Walking or climbing stairs? Y  Comment they have an elevator there (he doesn't do well with climbing stairs).  Dressing or bathing? N  Doing errands, shopping? N  Preparing Food and eating ? N  Using the Toilet? N  In the past six months, have you accidently leaked urine? N  Do you have problems with loss of bowel control? N  Managing your Medications? N  Managing your Finances? N  Housekeeping or managing your Housekeeping? N  Some recent data might be hidden    Patient Education/ Literacy How often do you need to have someone help you when you read instructions, pamphlets, or other written materials from your doctor or pharmacy?: 1 - Never What is the last grade level you completed in school?: Bachelor's Degree  Exercise Current Exercise Habits: Home  exercise routine, Type of exercise: strength training/weights;Other - see comments (stationary bike), Time (Minutes): 45, Frequency (Times/Week): 4, Weekly Exercise (Minutes/Week): 180, Intensity: Moderate, Exercise limited by: None identified  Diet Patient reports consuming 2 meals a day and 1 snack(s) a day Patient reports that his primary diet is: Regular Patient reports that she does have regular access to food.   Depression Screen PHQ 2/9 Scores 09/16/2020 05/07/2020 11/22/2019  PHQ - 2 Score 0 0 0  PHQ- 9 Score - - 2     Fall Risk Fall Risk  09/16/2020 05/07/2020 11/22/2019  Falls in the past year? 1 1 1   Number falls in past yr: 1 1 0  Injury with Fall? 1 1 1   Risk for fall due to : Impaired balance/gait Impaired balance/gait Impaired balance/gait;History of fall(s)  Follow up Falls evaluation completed;Education provided;Falls prevention discussed Falls prevention discussed Falls prevention discussed     Objective:  Efrem Pitstick seemed alert and oriented and he participated appropriately during our telephone visit.  Blood Pressure Weight BMI  BP Readings from Last 3 Encounters:  08/26/20 (!) 94/54  05/07/20 131/68  03/28/20 (!) 145/73   Wt Readings from Last 3 Encounters:  08/26/20 188 lb (85.3 kg)  05/07/20 183 lb (83 kg)  03/28/20 185 lb (83.9 kg)   BMI Readings from Last 1 Encounters:  08/26/20 26.22 kg/m    *Unable to obtain current vital signs, weight, and BMI due to telephone visit type  Hearing/Vision  Guido did  seem to have difficulty with hearing/understanding during the telephone conversation Reports that he has not had a formal eye exam by an eye care professional within the past year Reports that he has not had a formal hearing evaluation within the past year *Unable to fully assess hearing and vision during telephone visit type  Cognitive Function: 6CIT Screen 09/16/2020  What Year? 0 points  What month? 0 points  What time? 0 points  Count back from  20 0 points  Months in reverse  0 points  Repeat phrase 0 points  Total Score 0   (Normal:0-7, Significant for Dysfunction: >8)  Normal Cognitive Function Screening: Yes   Immunization & Health Maintenance Record Immunization History  Administered Date(s) Administered   Fluad Quad(high Dose 65+) 01/22/2020   Influenza Split 12/25/2010   Influenza, High Dose Seasonal PF 01/14/2015, 12/31/2015, 12/24/2016, 01/21/2018, 01/21/2018, 12/14/2018   Influenza, Seasonal, Injecte, Preservative Fre 01/02/2014   Influenza-Unspecified 12/26/2012   PFIZER(Purple Top)SARS-COV-2 Vaccination 05/06/2019, 05/29/2019, 11/29/2019, 07/11/2020   Pneumococcal Conjugate-13 06/16/2013   Pneumococcal Polysaccharide-23 03/23/2006   Tdap 08/14/2014, 09/07/2019   Zoster Recombinat (Shingrix) 11/11/2017, 03/18/2018   Zoster, Live 08/14/2014    Health Maintenance  Topic Date Due   INFLUENZA VACCINE  10/21/2020   COVID-19 Vaccine (5 - Booster for [place] series) 11/10/2020   TETANUS/TDAP  09/06/2029   PNA vac Low Risk Adult  Completed   Zoster Vaccines- Shingrix  Completed   HPV VACCINES  Aged Out       Assessment  This is a routine wellness examination for [employer].  Health Maintenance: Due or Overdue There are no preventive care reminders to display for this patient.  Cinch Ormond does not need a referral for Community Assistance: Care Management:   no Social Work:    no Prescription Assistance:  no Nutrition/Diabetes Education:  no   Plan:  Personalized Goals  Goals Addressed               This Visit's Progress     Patient Stated (pt-stated)        09/16/2020 AWV Goal: Exercise for General Health  Patient will verbalize understanding of the benefits of increased physical activity: Exercising regularly is important. It will improve your overall fitness, flexibility, and endurance. Regular exercise also will improve your overall health. It can help you control your weight, reduce  stress, and improve your bone density. Over the next year, patient will increase physical activity as tolerated with a goal of at least 150 minutes of moderate physical activity per week.  You can tell that you are exercising at a moderate intensity if your heart starts beating faster and you start breathing faster but can still hold a conversation. Moderate-intensity exercise ideas include: Walking 1 mile (1.6 km) in about 15 minutes Biking Hiking Golfing Dancing Water aerobics Patient will verbalize understanding of everyday activities that increase physical activity by providing examples like the following: Yard work, such as: Sales promotion account executive Gardening Washing windows or floors Patient will be able to explain general safety guidelines for exercising:  Before you start a new exercise program, talk with your health care provider. Do not exercise so much that you hurt yourself, feel dizzy, or get very short of breath. Wear comfortable clothes and wear shoes with good support. Drink plenty of water while you exercise to prevent dehydration or heat stroke. Work out until your breathing and your heartbeat get faster.         Personalized Health Maintenance & Screening Recommendations  Eye exam Hearing exam  Lung Cancer Screening Recommended: no (Low Dose CT Chest recommended if Age 35-80 years, 30 pack-year currently smoking OR have quit w/in past 15 years) Hepatitis C Screening recommended: no HIV Screening recommended: no  Advanced Directives: Written information was not prepared per patient's request.  Referrals & Orders No orders of the defined types were placed in this encounter.   Follow-up Plan Follow-up with Samuel Nutting,  DO as planned Schedule your hearing exam and your eye exam. Medicare wellness visit in one year.  AVS printed and mailed.   I have personally reviewed and  noted the following in the patient's chart:   Medical and social history Use of alcohol, tobacco or illicit drugs  Current medications and supplements Functional ability and status Nutritional status Physical activity Advanced directives List of other physicians Hospitalizations, surgeries, and ER visits in previous 12 months Vitals Screenings to include cognitive, depression, and falls Referrals and appointments  In addition, I have reviewed and discussed with Samuel Mcconnell certain preventive protocols, quality metrics, and best practice recommendations. A written personalized care plan for preventive services as well as general preventive health recommendations is available and can be mailed to the patient at his request.      Tinnie Gens, RN  09/16/2020

## 2020-09-16 NOTE — Patient Instructions (Addendum)
  [facility] Maintenance Summary and Written Plan of Care  [name] ,  Thank you for allowing me to perform your Medicare Annual Wellness Visit and for your ongoing commitment to your health.   Health Maintenance & Immunization History Health Maintenance  Topic Date Due   INFLUENZA VACCINE  10/21/2020   COVID-19 Vaccine (5 - Booster for Pfizer series) 11/10/2020   TETANUS/TDAP  09/06/2029   PNA vac Low Risk Adult  Completed   Zoster Vaccines- Shingrix  Completed   HPV VACCINES  Aged Out   Immunization History  Administered Date(s) Administered   Fluad Quad(high Dose 65+) 01/22/2020   Influenza Split 12/25/2010   Influenza, High Dose Seasonal PF 01/14/2015, 12/31/2015, 12/24/2016, 01/21/2018, 01/21/2018, 12/14/2018   Influenza, Seasonal, Injecte, Preservative Fre 01/02/2014   Influenza-Unspecified 12/26/2012   PFIZER(Purple Top)SARS-COV-2 Vaccination 05/06/2019, 05/29/2019, 11/29/2019, 07/11/2020   Pneumococcal Conjugate-13 06/16/2013   Pneumococcal Polysaccharide-23 03/23/2006   Tdap 08/14/2014, 09/07/2019   Zoster Recombinat (Shingrix) 11/11/2017, 03/18/2018   Zoster, Live 08/14/2014    These are the patient goals that we discussed:  Goals Addressed               This Visit's Progress     Patient Stated (pt-stated)        09/16/2020 AWV Goal: Exercise for General Health  Patient will verbalize understanding of the benefits of increased physical activity: Exercising regularly is important. It will improve your overall fitness, flexibility, and endurance. Regular exercise also will improve your overall health. It can help you control your weight, reduce stress, and improve your bone density. Over the next year, patient will increase physical activity as tolerated with a goal of at least 150 minutes of moderate physical activity per week.  You can tell that you are exercising at a moderate intensity if your heart starts beating faster and you  start breathing faster but can still hold a conversation. Moderate-intensity exercise ideas include: Walking 1 mile (1.6 km) in about 15 minutes Biking Hiking Golfing Dancing Water aerobics Patient will verbalize understanding of everyday activities that increase physical activity by providing examples like the following: Yard work, such as: Sales promotion account executive Gardening Washing windows or floors Patient will be able to explain general safety guidelines for exercising:  Before you start a new exercise program, talk with your health care provider. Do not exercise so much that you hurt yourself, feel dizzy, or get very short of breath. Wear comfortable clothes and wear shoes with good support. Drink plenty of water while you exercise to prevent dehydration or heat stroke. Work out until your breathing and your heartbeat get faster.           This is a list of Health Maintenance Items that are overdue or due now: Eye Exam Hearing Exam  Orders/Referrals Placed Today: No orders of the defined types were placed in this encounter.  (Contact our referral department at [phone] if you have not spoken with someone about your referral appointment within the next 5 days)    Follow-up Plan Follow-up with Luetta Nutting, DO as planned Schedule your hearing exam and your eye exam. Medicare wellness visit in one year.  AVS printed and mailed.

## 2020-10-02 ENCOUNTER — Other Ambulatory Visit: Payer: Self-pay | Admitting: Family Medicine

## 2020-10-14 ENCOUNTER — Other Ambulatory Visit: Payer: Self-pay

## 2020-10-14 ENCOUNTER — Telehealth: Payer: Self-pay | Admitting: Cardiovascular Disease

## 2020-10-14 ENCOUNTER — Ambulatory Visit (HOSPITAL_COMMUNITY)
Admission: RE | Admit: 2020-10-14 | Discharge: 2020-10-14 | Disposition: A | Discharge status: Home / Self Care | Payer: Medicare HMO | Source: Ambulatory Visit | Attending: Physician Assistant | Admitting: Physician Assistant

## 2020-10-14 ENCOUNTER — Encounter (HOSPITAL_COMMUNITY): Payer: Self-pay | Admitting: Physician Assistant

## 2020-10-14 VITALS — BP 130/62 | HR 118 | Ht 71.0 in | Wt 186.0 lb

## 2020-10-14 DIAGNOSIS — I110 Hypertensive heart disease with heart failure: Secondary | ICD-10-CM | POA: Insufficient documentation

## 2020-10-14 DIAGNOSIS — I4819 Other persistent atrial fibrillation: Secondary | ICD-10-CM | POA: Diagnosis not present

## 2020-10-14 DIAGNOSIS — D6869 Other thrombophilia: Secondary | ICD-10-CM | POA: Insufficient documentation

## 2020-10-14 DIAGNOSIS — Z7901 Long term (current) use of anticoagulants: Secondary | ICD-10-CM | POA: Insufficient documentation

## 2020-10-14 DIAGNOSIS — Z8249 Family history of ischemic heart disease and other diseases of the circulatory system: Secondary | ICD-10-CM | POA: Diagnosis not present

## 2020-10-14 DIAGNOSIS — Z8616 Personal history of COVID-19: Secondary | ICD-10-CM | POA: Diagnosis not present

## 2020-10-14 DIAGNOSIS — I5032 Chronic diastolic (congestive) heart failure: Secondary | ICD-10-CM | POA: Insufficient documentation

## 2020-10-14 DIAGNOSIS — Z79899 Other long term (current) drug therapy: Secondary | ICD-10-CM | POA: Insufficient documentation

## 2020-10-14 DIAGNOSIS — I484 Atypical atrial flutter: Secondary | ICD-10-CM | POA: Diagnosis not present

## 2020-10-14 DIAGNOSIS — Z87891 Personal history of nicotine dependence: Secondary | ICD-10-CM | POA: Diagnosis not present

## 2020-10-14 LAB — COMPREHENSIVE METABOLIC PANEL
ALT: 25 U/L (ref 0–44)
AST: 27 U/L (ref 15–41)
Albumin: 3.7 g/dL (ref 3.5–5.0)
Alkaline Phosphatase: 49 U/L (ref 38–126)
Anion gap: 7 (ref 5–15)
BUN: 29 mg/dL — ABNORMAL HIGH (ref 8–23)
CO2: 27 mmol/L (ref 22–32)
Calcium: 9.5 mg/dL (ref 8.9–10.3)
Chloride: 104 mmol/L (ref 98–111)
Creatinine, Ser: 1.59 mg/dL — ABNORMAL HIGH (ref 0.61–1.24)
GFR, Estimated: 41 mL/min — ABNORMAL LOW (ref >60)
Glucose, Bld: 100 mg/dL — ABNORMAL HIGH (ref 70–99)
Potassium: 5.1 mmol/L (ref 3.5–5.1)
Sodium: 138 mmol/L (ref 135–145)
Total Bilirubin: 0.9 mg/dL (ref 0.3–1.2)
Total Protein: 6.4 g/dL — ABNORMAL LOW (ref 6.5–8.1)

## 2020-10-14 LAB — CBC
HCT: 41.9 % (ref 39.0–52.0)
Hemoglobin: 13.4 g/dL (ref 13.0–17.0)
MCH: 30 pg (ref 26.0–34.0)
MCHC: 32 g/dL (ref 30.0–36.0)
MCV: 93.7 fL (ref 80.0–100.0)
Platelets: 304 10*3/uL (ref 150–400)
RBC: 4.47 MIL/uL (ref 4.22–5.81)
RDW: 13.9 % (ref 11.5–15.5)
WBC: 9.9 10*3/uL (ref 4.0–10.5)
nRBC: 0 % (ref 0.0–0.2)

## 2020-10-14 LAB — TSH: TSH: 3.256 u[IU]/mL (ref 0.350–4.500)

## 2020-10-14 NOTE — H&P (View-Only) (Signed)
Primary Care Physician: Luetta Nutting, DO Primary Cardiologist: Dr Sallyanne Kuster  Primary Electrophysiologist: none Referring Physician: Dr Narda Rutherford Pari is a 87 y.o. male with a history of HTN, CKD, chronic diastolic CHF, atypical atrial flutter, atrial fibrillation, and COPD who presents for consultation in the [facility].  The patient was initially diagnosed with atrial fibrillation remotely and had an ablation in 2011. Patient is on Eliquis for a CHADS2VASC score of 4. He did have a DCCV 10/24/19. He was in his usual state of health until 10/13/20 when he started experiencing symptoms of generalized fatigue and lightheadedness. This was similar to his previous afib symptoms. Of note, he did have COVID about one month ago. He denies any bleeding issues on anticoagulation.   Today, he denies symptoms of palpitations, chest pain, shortness of breath, orthopnea, PND, lower extremity edema, presyncope, syncope, snoring, daytime somnolence, bleeding, or neurologic sequela. The patient is tolerating medications without difficulties and is otherwise without complaint today.    Atrial Fibrillation Risk Factors:  he does not have symptoms or diagnosis of sleep apnea. he does not have a history of rheumatic fever. he does not have a history of alcohol use.   he has a BMI of Body mass index is 25.94 kg/m.Marland Kitchen Filed Weights   10/14/20 1421  Weight: 84.4 kg    Family History  Problem Relation Age of Onset   Heart failure Father        MI - died @ 85   Heart failure Mother    Diabetes Son    Pancreatitis Son        also ETOH abuse    Cancer Maternal Grandmother    Heart disease Maternal Grandfather    Atrial fibrillation Brother        dx'ed age 59   Atrial fibrillation Brother        dx'ed age 61   Heart failure Brother    Atrial fibrillation Sister    Hypertension Sister    Heart Problems Sister    Diabetes Sister    Heart failure Sister       Atrial Fibrillation Management history:  Previous antiarrhythmic drugs: amiodarone  Previous cardioversions: 10/24/19 Previous ablations: 05/2009 CHADS2VASC score: 4 Anticoagulation history: Eliquis   Past Medical History:  Diagnosis Date   Atrial fibrillation ([place])    radiofrequency ablation   Emphysema    Hypertension    Thyroid disease    Past Surgical History:  Procedure Laterality Date   CARDIAC SURGERY     CARDIOVERSION N/A 10/24/2019   Procedure: CARDIOVERSION;  Surgeon: Sanda Klein, MD;  Location: [facility];  Service: Cardiovascular;  Laterality: N/A;   COLON SURGERY  1999   KNEE ARTHROSCOPY Left 07/2000   LOOP RECORDER IMPLANT  01/16/2010   Medtronic Reveal XT (Dr. Norlene Duel)    RADIOFREQUENCY ABLATION  06/13/2009   SHOULDER ARTHROSCOPY WITH ROTATOR CUFF REPAIR Left 04/1996   TIBIAL TUBERCLERPLASTY  ~ 10/2010   TRANSTHORACIC ECHOCARDIOGRAM  11/19/2011   EF=>55%; mild MR, mild mitral annular calcif; mild TR, normal RSVP; AV mildly sclerotic, mild AV regurg; trace pulm valve regurg     Current Outpatient Medications  Medication Sig Dispense Refill   amiodarone (PACERONE) 200 MG tablet Take 1 tablet (200 mg total) by mouth daily. 90 tablet 3   apixaban (ELIQUIS) 2.5 MG TABS tablet Take 1 tablet (2.5 mg total) by mouth 2 (two) times daily. 180 tablet 3   Ascorbic Acid (VITAMIN  C) 1000 MG tablet Take 1,000 mg by mouth daily.     cetirizine (ZYRTEC) 10 MG tablet Take 10 mg by mouth daily.     diphenhydramine-acetaminophen (TYLENOL PM) 25-500 MG TABS tablet Take 1 tablet by mouth at bedtime as needed (sleep).     fluticasone (FLONASE) 50 MCG/ACT nasal spray Place 1 spray into both nostrils daily.     Fluticasone-Salmeterol (ADVAIR) 250-50 MCG/DOSE AEPB Inhale 1 puff into the lungs 2 (two) times daily. 180 each 1   furosemide (LASIX) 20 MG tablet Take 1 tablet (20 mg total) by mouth daily as needed for edema. 90 tablet 1   levothyroxine (SYNTHROID) 137 MCG tablet  TAKE 1 TABLET (137 MCG TOTAL) BY MOUTH DAILY BEFORE BREAKFAST. 90 tablet 2   losartan (COZAAR) 50 MG tablet Take 1 tablet (50 mg total) by mouth daily. 90 tablet 1   Multiple Vitamin (MULTIVITAMIN) capsule Take 1 capsule by mouth daily.     VITAMIN D PO Take by mouth.     Zinc 50 MG TABS Take 50 mg by mouth.     No current facility-administered medications for this encounter.    Allergies  Allergen Reactions   Clindamycin/Lincomycin Other (See Comments)    Pt felt weird     Dabigatran Etexilate Mesylate Other (See Comments)    "felt weird"   Doxycycline Other (See Comments)    Dyspepsia    Social History   Socioeconomic History   Marital status: Married    Spouse name: Barnett Applebaum   Number of children: 2   Years of education: 16   Highest education level: Bachelor's degree (e.g., BA, AB, BS)  Occupational History   Occupation: Retired  Tobacco Use   Smoking status: Former    Years: 46.00    Types: Cigarettes    Quit date: 03/14/2002    Years since quitting: 18.6   Smokeless tobacco: Never   Tobacco comments:    40 pack year history   Vaping Use   Vaping Use: Never used  Substance and Sexual Activity   Alcohol use: No   Drug use: No   Sexual activity: Not Currently    Partners: Female  Other Topics Concern   Not on file  Social History Narrative   Lives at [facility] with his wife. He is still driving. He enjoys golf and gardening (when is he is able to).   Social Determinants of Health   Financial Resource Strain: Low Risk    Difficulty of Paying Living Expenses: Not hard at all  Food Insecurity: No Food Insecurity   Worried About Charity fundraiser in the Last Year: Never true   Cash in the Last Year: Never true  Transportation Needs: No Transportation Needs   Lack of Transportation (Medical): No   Lack of Transportation (Non-Medical): No  Physical Activity: Sufficiently Active   Days of Exercise per Week: 4 days   Minutes of Exercise per  Session: 50 min  Stress: No Stress Concern Present   Feeling of Stress : Not at all  Social Connections: Socially Integrated   Frequency of Communication with Friends and Family: More than three times a week   Frequency of Social Gatherings with Friends and Family: More than three times a week   Attends Religious Services: More than 4 times per year   Active Member of [employer] or Organizations: Yes   Attends Archivist Meetings: Never   Marital Status: Married  Human resources officer Violence: Not At  Risk   Fear of Current or Ex-Partner: No   Emotionally Abused: No   Physically Abused: No   Sexually Abused: No     ROS- All systems are reviewed and negative except as per the HPI above.  Physical Exam: Vitals:   10/14/20 1421  BP: 130/62  Pulse: (!) 118  Weight: 84.4 kg  Height: '5\' 11"'$  (1.803 m)    GEN- The patient is a well appearing elderly male, alert and oriented x 3 today.   Head- normocephalic, atraumatic Eyes-  Sclera clear, conjunctiva pink Ears- hearing intact Oropharynx- clear Neck- supple  Lungs- Clear to ausculation bilaterally, normal work of breathing Heart- irregular rate and rhythm, no murmurs, rubs or gallops  GI- soft, NT, ND, + BS Extremities- no clubbing, cyanosis, or edema MS- no significant deformity or atrophy Skin- no rash or lesion Psych- euthymic mood, full affect Neuro- strength and sensation are intact  Wt Readings from Last 3 Encounters:  10/14/20 84.4 kg  08/26/20 85.3 kg  05/07/20 83 kg    EKG today demonstrates  Afib, RBBB Vent. rate 118 BPM PR interval * ms QRS duration 142 ms QT/QTcB 316/442 ms  Echo 03/26/16 demonstrated  - Left ventricle: The cavity size was normal. There was moderate    concentric hypertrophy. Systolic function was vigorous. The    estimated ejection fraction was in the range of 65% to 70%. Wall    motion was normal; there were no regional wall motion    abnormalities. Doppler parameters are consistent  with abnormal    left ventricular relaxation (grade 1 diastolic dysfunction).    Doppler parameters are consistent with indeterminate ventricular    filling pressure.  - Aortic valve: Transvalvular velocity was within the normal range.    There was no stenosis. There was mild regurgitation.  - Mitral valve: Transvalvular velocity was within the normal range.    There was no evidence for stenosis. There was no regurgitation.  - Left atrium: The atrium was moderately dilated.  - Right ventricle: The cavity size was normal. Wall thickness was    normal. Systolic function was normal.  - Tricuspid valve: There was trivial regurgitation.  - Pulmonary arteries: Systolic pressure was within the normal    range.   Epic records are reviewed at length today  CHA2DS2-VASc Score = 4  The patient's score is based upon: CHF History: Yes HTN History: Yes Diabetes History: No Stroke History: No Vascular Disease History: No Age Score: 2 Gender Score: 0     ASSESSMENT AND PLAN: 1. Persistent Atrial Fibrillation/atypical atrial flutter The patient's CHA2DS2-VASc score is 4, indicating a 4.8% annual risk of stroke.   Patient in rapid afib today. ? Related to recent COVID infection. Rates at home range from 105-120 bpm.  We discussed therapeutic options today. Will plan for DCCV. Check cmet/TSH/cbc. Will not start rate control at this time given his h/o tachybradycardia.  Continue amiodarone 200 mg daily Continue Eliquis 2.5 mg BID. Patient denies any missed doses in the last 3 weeks.   2. Secondary Hypercoagulable State (ICD10:  D68.69) The patient is at significant risk for stroke/thromboembolism based upon his CHA2DS2-VASc Score of 4.  Continue Apixaban (Eliquis).   3. Chronic diastolic CHF No signs or symptoms of fluid overload today.  4. HTN Stable, no changes today.   Follow up in the AF clinic one week post DCCV.    [facility] [address] [place], [place] [address]  [phone] 10/14/2020 2:28 PM

## 2020-10-14 NOTE — Patient Instructions (Signed)
Cardioversion scheduled for Tuesday, August 2nd  - Arrive at the [facility] and go to admitting at 730AM  - Do not eat or drink anything after midnight the night prior to your procedure.  - Take all your morning medication (except diabetic medications) with a sip of water prior to arrival.  - You will not be able to drive home after your procedure.  - Do NOT miss any doses of your blood thinner - if you should miss a dose please notify our office immediately.  - If you feel as if you go back into normal rhythm prior to scheduled cardioversion, please notify our office immediately. If your procedure is canceled in the cardioversion suite you will be charged a cancellation fee.  Patients will be asked to: to mask in public and hand hygiene (no longer quarantine) in the 3 days prior to surgery, to report if any COVID-19-like illness or household contacts to COVID-19 to determine need for testing

## 2020-10-14 NOTE — Progress Notes (Signed)
Primary Care Physician: Luetta Nutting, DO Primary Cardiologist: Dr Sallyanne Kuster  Primary Electrophysiologist: none Referring Physician: Dr Narda Rutherford Samuel Mcconnell is a 87 y.o. male with a history of HTN, CKD, chronic diastolic CHF, atypical atrial flutter, atrial fibrillation, and COPD who presents for consultation in the [facility].  The patient was initially diagnosed with atrial fibrillation remotely and had an ablation in 2011. Patient is on Eliquis for a CHADS2VASC score of 4. He did have a DCCV 10/24/19. He was in his usual state of health until 10/13/20 when he started experiencing symptoms of generalized fatigue and lightheadedness. This was similar to his previous afib symptoms. Of note, he did have COVID about one month ago. He denies any bleeding issues on anticoagulation.   Today, he denies symptoms of palpitations, chest pain, shortness of breath, orthopnea, PND, lower extremity edema, presyncope, syncope, snoring, daytime somnolence, bleeding, or neurologic sequela. The patient is tolerating medications without difficulties and is otherwise without complaint today.    Atrial Fibrillation Risk Factors:  he does not have symptoms or diagnosis of sleep apnea. he does not have a history of rheumatic fever. he does not have a history of alcohol use.   he has a BMI of Body mass index is 25.94 kg/m.Marland Kitchen Filed Weights   10/14/20 1421  Weight: 84.4 kg    Family History  Problem Relation Age of Onset   Heart failure Father        MI - died @ 2   Heart failure Mother    Diabetes Son    Pancreatitis Son        also ETOH abuse    Cancer Maternal Grandmother    Heart disease Maternal Grandfather    Atrial fibrillation Brother        dx'ed age 19   Atrial fibrillation Brother        dx'ed age 58   Heart failure Brother    Atrial fibrillation Sister    Hypertension Sister    Heart Problems Sister    Diabetes Sister    Heart failure Sister       Atrial Fibrillation Management history:  Previous antiarrhythmic drugs: amiodarone  Previous cardioversions: 10/24/19 Previous ablations: 05/2009 CHADS2VASC score: 4 Anticoagulation history: Eliquis   Past Medical History:  Diagnosis Date   Atrial fibrillation ([place])    radiofrequency ablation   Emphysema    Hypertension    Thyroid disease    Past Surgical History:  Procedure Laterality Date   CARDIAC SURGERY     CARDIOVERSION N/A 10/24/2019   Procedure: CARDIOVERSION;  Surgeon: Sanda Klein, MD;  Location: [facility];  Service: Cardiovascular;  Laterality: N/A;   COLON SURGERY  1999   KNEE ARTHROSCOPY Left 07/2000   LOOP RECORDER IMPLANT  01/16/2010   Medtronic Reveal XT (Dr. Norlene Duel)    RADIOFREQUENCY ABLATION  06/13/2009   SHOULDER ARTHROSCOPY WITH ROTATOR CUFF REPAIR Left 04/1996   TIBIAL TUBERCLERPLASTY  ~ 10/2010   TRANSTHORACIC ECHOCARDIOGRAM  11/19/2011   EF=>55%; mild MR, mild mitral annular calcif; mild TR, normal RSVP; AV mildly sclerotic, mild AV regurg; trace pulm valve regurg     Current Outpatient Medications  Medication Sig Dispense Refill   amiodarone (PACERONE) 200 MG tablet Take 1 tablet (200 mg total) by mouth daily. 90 tablet 3   apixaban (ELIQUIS) 2.5 MG TABS tablet Take 1 tablet (2.5 mg total) by mouth 2 (two) times daily. 180 tablet 3   Ascorbic Acid (VITAMIN  C) 1000 MG tablet Take 1,000 mg by mouth daily.     cetirizine (ZYRTEC) 10 MG tablet Take 10 mg by mouth daily.     diphenhydramine-acetaminophen (TYLENOL PM) 25-500 MG TABS tablet Take 1 tablet by mouth at bedtime as needed (sleep).     fluticasone (FLONASE) 50 MCG/ACT nasal spray Place 1 spray into both nostrils daily.     Fluticasone-Salmeterol (ADVAIR) 250-50 MCG/DOSE AEPB Inhale 1 puff into the lungs 2 (two) times daily. 180 each 1   furosemide (LASIX) 20 MG tablet Take 1 tablet (20 mg total) by mouth daily as needed for edema. 90 tablet 1   levothyroxine (SYNTHROID) 137 MCG tablet  TAKE 1 TABLET (137 MCG TOTAL) BY MOUTH DAILY BEFORE BREAKFAST. 90 tablet 2   losartan (COZAAR) 50 MG tablet Take 1 tablet (50 mg total) by mouth daily. 90 tablet 1   Multiple Vitamin (MULTIVITAMIN) capsule Take 1 capsule by mouth daily.     VITAMIN D PO Take by mouth.     Zinc 50 MG TABS Take 50 mg by mouth.     No current facility-administered medications for this encounter.    Allergies  Allergen Reactions   Clindamycin/Lincomycin Other (See Comments)    Pt felt weird     Dabigatran Etexilate Mesylate Other (See Comments)    "felt weird"   Doxycycline Other (See Comments)    Dyspepsia    Social History   Socioeconomic History   Marital status: Married    Spouse name: Samuel Mcconnell   Number of children: 2   Years of education: 16   Highest education level: Bachelor's degree (e.g., BA, AB, BS)  Occupational History   Occupation: Retired  Tobacco Use   Smoking status: Former    Years: 46.00    Types: Cigarettes    Quit date: 03/14/2002    Years since quitting: 18.6   Smokeless tobacco: Never   Tobacco comments:    40 pack year history   Vaping Use   Vaping Use: Never used  Substance and Sexual Activity   Alcohol use: No   Drug use: No   Sexual activity: Not Currently    Partners: Female  Other Topics Concern   Not on file  Social History Narrative   Lives at [employer] [facility] with his wife. He is still driving. He enjoys golf and gardening (when is he is able to).   Social Determinants of Health   Financial Resource Strain: Low Risk    Difficulty of Paying Living Expenses: Not hard at all  Food Insecurity: No Food Insecurity   Worried About Charity fundraiser in the Last Year: Never true   [place] in the Last Year: Never true  Transportation Needs: No Transportation Needs   Lack of Transportation (Medical): No   Lack of Transportation (Non-Medical): No  Physical Activity: Sufficiently Active   Days of Exercise per Week: 4 days   Minutes of Exercise per  Session: 50 min  Stress: No Stress Concern Present   Feeling of Stress : Not at all  Social Connections: Socially Integrated   Frequency of Communication with Friends and Family: More than three times a week   Frequency of Social Gatherings with Friends and Family: More than three times a week   Attends Religious Services: More than 4 times per year   Active Member of [employer] or Organizations: Yes   Attends Archivist Meetings: Never   Marital Status: Married  Human resources officer Violence: Not At  Risk   Fear of Current or Ex-Partner: No   Emotionally Abused: No   Physically Abused: No   Sexually Abused: No     ROS- All systems are reviewed and negative except as per the HPI above.  Physical Exam: Vitals:   10/14/20 1421  BP: 130/62  Pulse: (!) 118  Weight: 84.4 kg  Height: '5\' 11"'$  (1.803 m)    GEN- The patient is a well appearing elderly male, alert and oriented x 3 today.   Head- normocephalic, atraumatic Eyes-  Sclera clear, conjunctiva pink Ears- hearing intact Oropharynx- clear Neck- supple  Lungs- Clear to ausculation bilaterally, normal work of breathing Heart- irregular rate and rhythm, no murmurs, rubs or gallops  GI- soft, NT, ND, + BS Extremities- no clubbing, cyanosis, or edema MS- no significant deformity or atrophy Skin- no rash or lesion Psych- euthymic mood, full affect Neuro- strength and sensation are intact  Wt Readings from Last 3 Encounters:  10/14/20 84.4 kg  08/26/20 85.3 kg  05/07/20 83 kg    EKG today demonstrates  Afib, RBBB Vent. rate 118 BPM PR interval * ms QRS duration 142 ms QT/QTcB 316/442 ms  Echo 03/26/16 demonstrated  - Left ventricle: The cavity size was normal. There was moderate    concentric hypertrophy. Systolic function was vigorous. The    estimated ejection fraction was in the range of 65% to 70%. Wall    motion was normal; there were no regional wall motion    abnormalities. Doppler parameters are consistent  with abnormal    left ventricular relaxation (grade 1 diastolic dysfunction).    Doppler parameters are consistent with indeterminate ventricular    filling pressure.  - Aortic valve: Transvalvular velocity was within the normal range.    There was no stenosis. There was mild regurgitation.  - Mitral valve: Transvalvular velocity was within the normal range.    There was no evidence for stenosis. There was no regurgitation.  - Left atrium: The atrium was moderately dilated.  - Right ventricle: The cavity size was normal. Wall thickness was    normal. Systolic function was normal.  - Tricuspid valve: There was trivial regurgitation.  - Pulmonary arteries: Systolic pressure was within the normal    range.   Epic records are reviewed at length today  CHA2DS2-VASc Score = 4  The patient's score is based upon: CHF History: Yes HTN History: Yes Diabetes History: No Stroke History: No Vascular Disease History: No Age Score: 2 Gender Score: 0     ASSESSMENT AND PLAN: 1. Persistent Atrial Fibrillation/atypical atrial flutter The patient's CHA2DS2-VASc score is 4, indicating a 4.8% annual risk of stroke.   Patient in rapid afib today. ? Related to recent COVID infection. Rates at home range from 105-120 bpm.  We discussed therapeutic options today. Will plan for DCCV. Check cmet/TSH/cbc. Will not start rate control at this time given his h/o tachybradycardia.  Continue amiodarone 200 mg daily Continue Eliquis 2.5 mg BID. Patient denies any missed doses in the last 3 weeks.   2. Secondary Hypercoagulable State (ICD10:  D68.69) The patient is at significant risk for stroke/thromboembolism based upon his CHA2DS2-VASc Score of 4.  Continue Apixaban (Eliquis).   3. Chronic diastolic CHF No signs or symptoms of fluid overload today.  4. HTN Stable, no changes today.   Follow up in the AF clinic one week post DCCV.    [facility] [address] [place], [place] [address]  [phone] 10/14/2020 2:28 PM

## 2020-10-14 NOTE — Telephone Encounter (Signed)
Pt called stating since yesterday morning he's been in a fib. He report he doesn't have a monitor device but can typically tell when he's out of rhythm based on symptoms. He report feeling dizzy and no energy, but denies feeling like he's going to black out.  7/24 146/92 HR 108 7/25 121/89 HR 102  Pt made aware Dr. Loletha Grayer is out of the office this week. Appointment scheduled with Afib clinic today at 2:30 pm for further evaluations.  Will for to MD to make aware.

## 2020-10-14 NOTE — Telephone Encounter (Signed)
Patient c/o Palpitations:  High priority if patient c/o lightheadedness, shortness of breath, or chest pain  How long have you had palpitations/irregular HR/ Afib? Are you having the symptoms now? Been in AFib  since Saturday morning and he is in it now  Are you currently experiencing lightheadedness, SOB or CP? Lightheaded off and on  Do you have a history of afib (atrial fibrillation) or irregular heart rhythm? yes  Have you checked your BP or HR? (document readings if available): 129/89 and pulse is 102  Are you experiencing any other symptoms? no

## 2020-10-16 ENCOUNTER — Other Ambulatory Visit: Payer: Self-pay | Admitting: Family Medicine

## 2020-10-16 DIAGNOSIS — I517 Cardiomegaly: Secondary | ICD-10-CM

## 2020-10-22 ENCOUNTER — Encounter (HOSPITAL_COMMUNITY): Payer: Self-pay | Admitting: Cardiovascular Disease

## 2020-10-22 ENCOUNTER — Other Ambulatory Visit: Payer: Self-pay

## 2020-10-22 ENCOUNTER — Ambulatory Visit (HOSPITAL_COMMUNITY): Payer: Medicare HMO | Admitting: Certified Registered Nurse Anesthetist

## 2020-10-22 ENCOUNTER — Encounter (HOSPITAL_COMMUNITY)
Admission: RE | Disposition: A | Discharge status: Home / Self Care | Payer: Self-pay | Source: Home / Self Care | Attending: Cardiovascular Disease

## 2020-10-22 ENCOUNTER — Ambulatory Visit (HOSPITAL_COMMUNITY)
Admission: RE | Admit: 2020-10-22 | Discharge: 2020-10-22 | Disposition: A | Discharge status: Home / Self Care | Payer: Medicare HMO | Attending: Cardiovascular Disease | Admitting: Cardiovascular Disease

## 2020-10-22 DIAGNOSIS — Z888 Allergy status to other drugs, medicaments and biological substances status: Secondary | ICD-10-CM | POA: Diagnosis not present

## 2020-10-22 DIAGNOSIS — Z79899 Other long term (current) drug therapy: Secondary | ICD-10-CM | POA: Insufficient documentation

## 2020-10-22 DIAGNOSIS — I5032 Chronic diastolic (congestive) heart failure: Secondary | ICD-10-CM | POA: Diagnosis not present

## 2020-10-22 DIAGNOSIS — I4819 Other persistent atrial fibrillation: Secondary | ICD-10-CM | POA: Insufficient documentation

## 2020-10-22 DIAGNOSIS — Z7901 Long term (current) use of anticoagulants: Secondary | ICD-10-CM | POA: Diagnosis not present

## 2020-10-22 DIAGNOSIS — I484 Atypical atrial flutter: Secondary | ICD-10-CM | POA: Diagnosis not present

## 2020-10-22 DIAGNOSIS — D6859 Other primary thrombophilia: Secondary | ICD-10-CM | POA: Diagnosis not present

## 2020-10-22 DIAGNOSIS — N189 Chronic kidney disease, unspecified: Secondary | ICD-10-CM | POA: Diagnosis not present

## 2020-10-22 DIAGNOSIS — N183 Chronic kidney disease, stage 3 unspecified: Secondary | ICD-10-CM | POA: Diagnosis not present

## 2020-10-22 DIAGNOSIS — J449 Chronic obstructive pulmonary disease, unspecified: Secondary | ICD-10-CM | POA: Diagnosis not present

## 2020-10-22 DIAGNOSIS — Z79890 Hormone replacement therapy (postmenopausal): Secondary | ICD-10-CM | POA: Diagnosis not present

## 2020-10-22 DIAGNOSIS — Z8616 Personal history of COVID-19: Secondary | ICD-10-CM | POA: Diagnosis not present

## 2020-10-22 DIAGNOSIS — I130 Hypertensive heart and chronic kidney disease with heart failure and stage 1 through stage 4 chronic kidney disease, or unspecified chronic kidney disease: Secondary | ICD-10-CM | POA: Diagnosis not present

## 2020-10-22 DIAGNOSIS — Z87891 Personal history of nicotine dependence: Secondary | ICD-10-CM | POA: Diagnosis not present

## 2020-10-22 DIAGNOSIS — Z881 Allergy status to other antibiotic agents status: Secondary | ICD-10-CM | POA: Insufficient documentation

## 2020-10-22 DIAGNOSIS — I129 Hypertensive chronic kidney disease with stage 1 through stage 4 chronic kidney disease, or unspecified chronic kidney disease: Secondary | ICD-10-CM | POA: Diagnosis not present

## 2020-10-22 DIAGNOSIS — Z7951 Long term (current) use of inhaled steroids: Secondary | ICD-10-CM | POA: Diagnosis not present

## 2020-10-22 DIAGNOSIS — I480 Paroxysmal atrial fibrillation: Secondary | ICD-10-CM | POA: Diagnosis not present

## 2020-10-22 HISTORY — PX: CARDIOVERSION: SHX1299

## 2020-10-22 SURGERY — CARDIOVERSION
Anesthesia: General

## 2020-10-22 MED ORDER — LIDOCAINE 2% (20 MG/ML) 5 ML SYRINGE
INTRAMUSCULAR | Status: DC | PRN
  Administered 2020-10-22: 60 mg via INTRAVENOUS

## 2020-10-22 MED ORDER — SODIUM CHLORIDE 0.9 % IV SOLN
INTRAVENOUS | Status: AC | PRN
  Administered 2020-10-22: 500 mL via INTRAMUSCULAR

## 2020-10-22 MED ORDER — PROPOFOL 10 MG/ML IV BOLUS
INTRAVENOUS | Status: DC | PRN
  Administered 2020-10-22: 40 mg via INTRAVENOUS

## 2020-10-22 MED ORDER — PHENYLEPHRINE 40 MCG/ML (10ML) SYRINGE FOR IV PUSH (FOR BLOOD PRESSURE SUPPORT)
PREFILLED_SYRINGE | INTRAVENOUS | Status: DC | PRN
  Administered 2020-10-22: 80 ug via INTRAVENOUS

## 2020-10-22 NOTE — Interval H&P Note (Signed)
History and Physical Interval Note:  10/22/2020 8:48 AM  Samuel Mcconnell  has presented today for surgery, with the diagnosis of A-FIB.  The various methods of treatment have been discussed with the patient and family. After consideration of risks, benefits and other options for treatment, the patient has consented to  Procedure(s): CARDIOVERSION (N/A) as a surgical intervention.  The patient's history has been reviewed, patient examined, no change in status, stable for surgery.  I have reviewed the patient's chart and labs.  Questions were answered to the patient's satisfaction.     Mertie Moores

## 2020-10-22 NOTE — Transfer of Care (Signed)
Immediate Anesthesia Transfer of Care Note  Patient: Samuel Mcconnell  Procedure(s) Performed: CARDIOVERSION  Patient Location: Endoscopy Unit  Anesthesia Type:General  Level of Consciousness: awake, alert  and oriented  Airway & Oxygen Therapy: Patient Spontanous Breathing and Patient connected to nasal cannula oxygen  Post-op Assessment: Report given to RN and Post -op Vital signs reviewed and stable  Post vital signs: Reviewed and stable  Last Vitals:  Vitals Value Taken Time  BP 90/50   Temp    Pulse 60 10/22/20 0848  Resp 13 10/22/20 0848  SpO2 96 % 10/22/20 0848  Vitals shown include unvalidated device data.  Last Pain:  Vitals:   10/22/20 0732  PainSc: 0-No pain         Complications: No notable events documented.

## 2020-10-22 NOTE — CV Procedure (Signed)
    Cardioversion Note  Samuel Mcconnell VO:[id number] [birth year]  Procedure: DC Cardioversion Indications: Atrial fib / atrial flutter   Procedure Details Consent: Obtained Time Out: Verified patient identification, verified procedure, site/side was marked, verified correct patient position, special equipment/implants available, Radiology Safety Procedures followed,  medications/allergies/relevent history reviewed, required imaging and test results available.  Performed  The patient has been on adequate anticoagulation.  The patient received IV Lidocaine 60 mg IV followed by Proporol 40 mg IV  for sedation.  Synchronous cardioversion was performed at 200 joules.  The cardioversion was successful.     Complications: No apparent complications Patient did tolerate procedure well.   Thayer Headings, Brooke Bonito., MD, [facility] 10/22/2020, 8:49 AM

## 2020-10-22 NOTE — Anesthesia Preprocedure Evaluation (Signed)
Anesthesia Evaluation  Patient identified by MRN, date of birth, ID band Patient awake    Reviewed: Allergy & Precautions, NPO status , Patient's Chart, lab work & pertinent test results  History of Anesthesia Complications Negative for: history of anesthetic complications  Airway Mallampati: II  TM Distance: >3 FB Neck ROM: Full    Dental  (+) Dental Advisory Given   Pulmonary COPD, former smoker,    breath sounds clear to auscultation       Cardiovascular hypertension, Pt. on medications + dysrhythmias Atrial Fibrillation  Rhythm:Irregular Rate:Normal     Neuro/Psych negative neurological ROS  negative psych ROS   GI/Hepatic negative GI ROS, Neg liver ROS,   Endo/Other  Hypothyroidism   Renal/GU Renal diseaseLab Results      Component                Value               Date                      CREATININE               1.59 (H)            10/14/2020           Lab Results      Component                Value               Date                      K                        5.1                 10/14/2020                Musculoskeletal negative musculoskeletal ROS (+)   Abdominal   Peds  Hematology negative hematology ROS (+)   Anesthesia Other Findings   Reproductive/Obstetrics                             Anesthesia Physical Anesthesia Plan  ASA: 3  Anesthesia Plan: General   Post-op Pain Management:    Induction: Intravenous  PONV Risk Score and Plan: 2 and Treatment may vary due to age or medical condition  Airway Management Planned: Nasal Cannula and Mask  Additional Equipment: None  Intra-op Plan:   Post-operative Plan:   Informed Consent: I have reviewed the patients History and Physical, chart, labs and discussed the procedure including the risks, benefits and alternatives for the proposed anesthesia with the patient or authorized representative who has indicated  his/her understanding and acceptance.     Dental advisory given  Plan Discussed with: CRNA and Anesthesiologist  Anesthesia Plan Comments:         Anesthesia Quick Evaluation

## 2020-10-22 NOTE — Anesthesia Postprocedure Evaluation (Signed)
Anesthesia Post Note  Patient: Samuel Mcconnell  Procedure(s) Performed: CARDIOVERSION     Patient location during evaluation: Endoscopy Anesthesia Type: General Level of consciousness: awake and alert Pain management: pain level controlled Vital Signs Assessment: post-procedure vital signs reviewed and stable Respiratory status: spontaneous breathing, nonlabored ventilation, respiratory function stable and patient connected to nasal cannula oxygen Cardiovascular status: stable Postop Assessment: no apparent nausea or vomiting Anesthetic complications: no   No notable events documented.  Last Vitals:  Vitals:   10/22/20 0920 10/22/20 0925  BP: (!) 111/57   Pulse: (!) 55   Resp: 11 13  Temp:    SpO2: 97%     Last Pain:  Vitals:   10/22/20 0916  TempSrc:   PainSc: 0-No pain                 Makeba Delcastillo

## 2020-10-23 ENCOUNTER — Encounter (HOSPITAL_COMMUNITY): Payer: Self-pay | Admitting: Cardiovascular Disease

## 2020-10-28 DIAGNOSIS — Z20828 Contact with and (suspected) exposure to other viral communicable diseases: Secondary | ICD-10-CM | POA: Diagnosis not present

## 2020-10-29 ENCOUNTER — Ambulatory Visit (HOSPITAL_COMMUNITY)
Admission: RE | Admit: 2020-10-29 | Discharge: 2020-10-29 | Disposition: A | Discharge status: Home / Self Care | Payer: Medicare HMO | Source: Ambulatory Visit | Attending: Physician Assistant | Admitting: Physician Assistant

## 2020-10-29 ENCOUNTER — Encounter (HOSPITAL_COMMUNITY): Payer: Self-pay | Admitting: Physician Assistant

## 2020-10-29 ENCOUNTER — Other Ambulatory Visit: Payer: Self-pay

## 2020-10-29 VITALS — BP 112/72 | HR 61 | Ht 71.0 in | Wt 188.4 lb

## 2020-10-29 DIAGNOSIS — I5032 Chronic diastolic (congestive) heart failure: Secondary | ICD-10-CM | POA: Diagnosis not present

## 2020-10-29 DIAGNOSIS — D6869 Other thrombophilia: Secondary | ICD-10-CM | POA: Diagnosis not present

## 2020-10-29 DIAGNOSIS — N189 Chronic kidney disease, unspecified: Secondary | ICD-10-CM | POA: Insufficient documentation

## 2020-10-29 DIAGNOSIS — Z09 Encounter for follow-up examination after completed treatment for conditions other than malignant neoplasm: Secondary | ICD-10-CM | POA: Insufficient documentation

## 2020-10-29 DIAGNOSIS — Z79899 Other long term (current) drug therapy: Secondary | ICD-10-CM | POA: Diagnosis not present

## 2020-10-29 DIAGNOSIS — J449 Chronic obstructive pulmonary disease, unspecified: Secondary | ICD-10-CM | POA: Diagnosis not present

## 2020-10-29 DIAGNOSIS — I4819 Other persistent atrial fibrillation: Secondary | ICD-10-CM | POA: Diagnosis not present

## 2020-10-29 DIAGNOSIS — Z7901 Long term (current) use of anticoagulants: Secondary | ICD-10-CM | POA: Insufficient documentation

## 2020-10-29 DIAGNOSIS — Z87891 Personal history of nicotine dependence: Secondary | ICD-10-CM | POA: Diagnosis not present

## 2020-10-29 DIAGNOSIS — Z8616 Personal history of COVID-19: Secondary | ICD-10-CM | POA: Insufficient documentation

## 2020-10-29 DIAGNOSIS — Z8249 Family history of ischemic heart disease and other diseases of the circulatory system: Secondary | ICD-10-CM | POA: Insufficient documentation

## 2020-10-29 DIAGNOSIS — I130 Hypertensive heart and chronic kidney disease with heart failure and stage 1 through stage 4 chronic kidney disease, or unspecified chronic kidney disease: Secondary | ICD-10-CM | POA: Insufficient documentation

## 2020-10-29 DIAGNOSIS — I4892 Unspecified atrial flutter: Secondary | ICD-10-CM | POA: Diagnosis not present

## 2020-10-29 NOTE — Progress Notes (Signed)
Primary Care Physician: Luetta Nutting, DO Primary Cardiologist: Dr Sallyanne Kuster  Primary Electrophysiologist: none Referring Physician: Dr Narda Rutherford Klemp is a 87 y.o. male with a history of HTN, CKD, chronic diastolic CHF, atypical atrial flutter, atrial fibrillation, and COPD who presents for follow up in the [facility].  The patient was initially diagnosed with atrial fibrillation remotely and had an ablation in 2011. Patient is on Eliquis for a CHADS2VASC score of 4. He did have a DCCV 10/24/19. He was in his usual state of health until 10/13/20 when he started experiencing symptoms of generalized fatigue and lightheadedness. This was similar to his previous afib symptoms. Of note, he did have COVID about one month prior.  On follow up today, patient is s/p DCCV on 10/22/20. He reports that he feels "much better" with resolution of his lightheadedness. He denies any bleeding issues on anticoagulation.    Today, he denies symptoms of palpitations, chest pain, shortness of breath, orthopnea, PND, lower extremity edema, presyncope, syncope, snoring, daytime somnolence, bleeding, or neurologic sequela. The patient is tolerating medications without difficulties and is otherwise without complaint today.    Atrial Fibrillation Risk Factors:  he does not have symptoms or diagnosis of sleep apnea. he does not have a history of rheumatic fever. he does not have a history of alcohol use.   he has a BMI of Body mass index is 26.28 kg/m.Marland Kitchen Filed Weights   10/29/20 1415  Weight: 85.5 kg    Family History  Problem Relation Age of Onset   Heart failure Father        MI - died @ 63   Heart failure Mother    Diabetes Son    Pancreatitis Son        also ETOH abuse    Cancer Maternal Grandmother    Heart disease Maternal Grandfather    Atrial fibrillation Brother        dx'ed age 72   Atrial fibrillation Brother        dx'ed age 46   Heart failure Brother     Atrial fibrillation Sister    Hypertension Sister    Heart Problems Sister    Diabetes Sister    Heart failure Sister      Atrial Fibrillation Management history:  Previous antiarrhythmic drugs: amiodarone  Previous cardioversions: 10/24/19, 10/22/20 Previous ablations: 05/2009 CHADS2VASC score: 4 Anticoagulation history: Eliquis   Past Medical History:  Diagnosis Date   Atrial fibrillation ([place])    radiofrequency ablation   Emphysema    Hypertension    Thyroid disease    Past Surgical History:  Procedure Laterality Date   CARDIAC SURGERY     CARDIOVERSION N/A 10/24/2019   Procedure: CARDIOVERSION;  Surgeon: Sanda Klein, MD;  Location: [place];  Service: Cardiovascular;  Laterality: N/A;   CARDIOVERSION N/A 10/22/2020   Procedure: CARDIOVERSION;  Surgeon: Thayer Headings, MD;  Location: [place];  Service: Cardiovascular;  Laterality: N/A;   COLON SURGERY  1999   KNEE ARTHROSCOPY Left 07/2000   LOOP RECORDER IMPLANT  01/16/2010   Medtronic Reveal XT (Dr. Norlene Duel)    RADIOFREQUENCY ABLATION  06/13/2009   SHOULDER ARTHROSCOPY WITH ROTATOR CUFF REPAIR Left 04/1996   TIBIAL TUBERCLERPLASTY  ~ 10/2010   TRANSTHORACIC ECHOCARDIOGRAM  11/19/2011   EF=>55%; mild MR, mild mitral annular calcif; mild TR, normal RSVP; AV mildly sclerotic, mild AV regurg; trace pulm valve regurg     Current Outpatient Medications  Medication Sig Dispense Refill   acetaminophen (TYLENOL) 500 MG tablet Take 500 mg by mouth daily as needed for moderate pain.     amiodarone (PACERONE) 200 MG tablet Take 1 tablet (200 mg total) by mouth daily. 90 tablet 3   apixaban (ELIQUIS) 2.5 MG TABS tablet Take 1 tablet (2.5 mg total) by mouth 2 (two) times daily. 180 tablet 3   Boswellia-Glucosamine-Vit D (OSTEO BI-FLEX ONE PER DAY) TABS Take 1 tablet by mouth daily.     Cholecalciferol (DIALYVITE VITAMIN D 5000) 125 MCG (5000 UT) capsule Take 5,000 Units by mouth daily with lunch.      diphenhydramine-acetaminophen (TYLENOL PM) 25-500 MG TABS tablet Take 1 tablet by mouth at bedtime.     fexofenadine (ALLEGRA) 180 MG tablet Take 180 mg by mouth at bedtime.     fluticasone (FLONASE) 50 MCG/ACT nasal spray Place 1 spray into both nostrils at bedtime.     Fluticasone-Salmeterol (ADVAIR) 250-50 MCG/DOSE AEPB Inhale 1 puff into the lungs 2 (two) times daily. 180 each 1   furosemide (LASIX) 20 MG tablet TAKE 1 TABLET EVERY DAY AS NEEDED FOR  EDEMA 90 tablet 1   Ketotifen Fumarate (ITCHY EYE DROPS OP) Place 1 drop into both eyes daily as needed (itchy eyes).     levothyroxine (SYNTHROID) 137 MCG tablet TAKE 1 TABLET (137 MCG TOTAL) BY MOUTH DAILY BEFORE BREAKFAST. 90 tablet 2   losartan (COZAAR) 50 MG tablet Take 1 tablet (50 mg total) by mouth daily. 90 tablet 1   Multiple Vitamin (MULTIVITAMIN) capsule Take 1 capsule by mouth daily.     Naphazoline HCl (CLEAR EYES OP) Place 1 drop into both eyes daily as needed (redness).     vitamin C (ASCORBIC ACID) 500 MG tablet Take 500 mg by mouth daily with lunch.     zinc gluconate 50 MG tablet Take 50 mg by mouth daily with lunch.     No current facility-administered medications for this encounter.    Allergies  Allergen Reactions   Clindamycin/Lincomycin Other (See Comments)    Pt felt weird     Dabigatran Etexilate Mesylate Other (See Comments)    "felt weird"   Doxycycline Other (See Comments)    Dyspepsia    Social History   Socioeconomic History   Marital status: Married    Spouse name: Barnett Applebaum   Number of children: 2   Years of education: 16   Highest education level: Bachelor's degree (e.g., BA, AB, BS)  Occupational History   Occupation: Retired  Tobacco Use   Smoking status: Former    Years: 46.00    Types: Cigarettes    Quit date: 03/14/2002    Years since quitting: 18.6   Smokeless tobacco: Never   Tobacco comments:    40 pack year history   Vaping Use   Vaping Use: Never used  Substance and Sexual  Activity   Alcohol use: No   Drug use: No   Sexual activity: Not Currently    Partners: Female  Other Topics Concern   Not on file  Social History Narrative   Lives at [facility] with his wife. He is still driving. He enjoys golf and gardening (when is he is able to).   Social Determinants of Health   Financial Resource Strain: Low Risk    Difficulty of Paying Living Expenses: Not hard at all  Food Insecurity: No Food Insecurity   Worried About Charity fundraiser in the Last Year: Never true  Ran Out of Food in the Last Year: Never true  Transportation Needs: No Transportation Needs   Lack of Transportation (Medical): No   Lack of Transportation (Non-Medical): No  Physical Activity: Sufficiently Active   Days of Exercise per Week: 4 days   Minutes of Exercise per Session: 50 min  Stress: No Stress Concern Present   Feeling of Stress : Not at all  Social Connections: Socially Integrated   Frequency of Communication with Friends and Family: More than three times a week   Frequency of Social Gatherings with Friends and Family: More than three times a week   Attends Religious Services: More than 4 times per year   Active Member of [employer] or Organizations: Yes   Attends Archivist Meetings: Never   Marital Status: Married  Human resources officer Violence: Not At Risk   Fear of Current or Ex-Partner: No   Emotionally Abused: No   Physically Abused: No   Sexually Abused: No     ROS- All systems are reviewed and negative except as per the HPI above.  Physical Exam: Vitals:   10/29/20 1415  BP: 112/72  Pulse: 61  Weight: 85.5 kg  Height: '5\' 11"'$  (1.803 m)    GEN- The patient is a well appearing elderly male, alert and oriented x 3 today.   HEENT-head normocephalic, atraumatic, sclera clear, conjunctiva pink, hearing intact, trachea midline. Lungs- Clear to ausculation bilaterally, normal work of breathing Heart- Regular rate and rhythm, no murmurs, rubs or  gallops  GI- soft, NT, ND, + BS Extremities- no clubbing, cyanosis, or edema MS- no significant deformity or atrophy Skin- no rash or lesion Psych- euthymic mood, full affect Neuro- strength and sensation are intact   Wt Readings from Last 3 Encounters:  10/29/20 85.5 kg  10/22/20 84.7 kg  10/14/20 84.4 kg    EKG today demonstrates  SR, 1st degree AV block, RBBB Vent. rate 61 BPM PR interval 214 ms QRS duration 156 ms QT/QTcB 472/475 ms  Echo 03/26/16 demonstrated  - Left ventricle: The cavity size was normal. There was moderate    concentric hypertrophy. Systolic function was vigorous. The    estimated ejection fraction was in the range of 65% to 70%. Wall    motion was normal; there were no regional wall motion    abnormalities. Doppler parameters are consistent with abnormal    left ventricular relaxation (grade 1 diastolic dysfunction).    Doppler parameters are consistent with indeterminate ventricular    filling pressure.  - Aortic valve: Transvalvular velocity was within the normal range.    There was no stenosis. There was mild regurgitation.  - Mitral valve: Transvalvular velocity was within the normal range.    There was no evidence for stenosis. There was no regurgitation.  - Left atrium: The atrium was moderately dilated.  - Right ventricle: The cavity size was normal. Wall thickness was    normal. Systolic function was normal.  - Tricuspid valve: There was trivial regurgitation.  - Pulmonary arteries: Systolic pressure was within the normal    range.   Epic records are reviewed at length today  CHA2DS2-VASc Score = 4  The patient's score is based upon: CHF History: Yes HTN History: Yes Diabetes History: No Stroke History: No Vascular Disease History: No Age Score: 2 Gender Score: 0     ASSESSMENT AND PLAN: 1. Persistent Atrial Fibrillation/atypical atrial flutter The patient's CHA2DS2-VASc score is 4, indicating a 4.8% annual risk of stroke.   S/p  DCCV 10/22/20 Patient back in [place]. Continue amiodarone 200 mg daily Continue Eliquis 2.5 mg BID  2. Secondary Hypercoagulable State (ICD10:  D68.69) The patient is at significant risk for stroke/thromboembolism based upon his CHA2DS2-VASc Score of 4.  Continue Apixaban (Eliquis).   3. Chronic diastolic CHF No signs or symptoms of fluid overload today.  4. HTN Stable, no changes today.   Follow up with Dr Sallyanne Kuster as scheduled.    [facility] [address] [place], [place] [address] [phone] 10/29/2020 2:38 PM

## 2020-11-04 ENCOUNTER — Ambulatory Visit: Payer: Medicare HMO | Admitting: Family Medicine

## 2020-11-07 DIAGNOSIS — Z20828 Contact with and (suspected) exposure to other viral communicable diseases: Secondary | ICD-10-CM | POA: Diagnosis not present

## 2020-11-11 DIAGNOSIS — Z20828 Contact with and (suspected) exposure to other viral communicable diseases: Secondary | ICD-10-CM | POA: Diagnosis not present

## 2020-11-14 DIAGNOSIS — Z20828 Contact with and (suspected) exposure to other viral communicable diseases: Secondary | ICD-10-CM | POA: Diagnosis not present

## 2020-11-18 ENCOUNTER — Other Ambulatory Visit: Payer: Self-pay

## 2020-11-18 ENCOUNTER — Other Ambulatory Visit: Payer: Self-pay | Admitting: Cardiovascular Disease

## 2020-11-18 ENCOUNTER — Encounter: Payer: Self-pay | Admitting: Family Medicine

## 2020-11-18 ENCOUNTER — Ambulatory Visit (INDEPENDENT_AMBULATORY_CARE_PROVIDER_SITE_OTHER): Payer: Medicare HMO | Admitting: Family Medicine

## 2020-11-18 VITALS — BP 104/63 | HR 58 | Ht 71.0 in | Wt 186.0 lb

## 2020-11-18 DIAGNOSIS — J309 Allergic rhinitis, unspecified: Secondary | ICD-10-CM | POA: Diagnosis not present

## 2020-11-18 DIAGNOSIS — I480 Paroxysmal atrial fibrillation: Secondary | ICD-10-CM

## 2020-11-18 DIAGNOSIS — N1832 Chronic kidney disease, stage 3b: Secondary | ICD-10-CM

## 2020-11-18 DIAGNOSIS — Z23 Encounter for immunization: Secondary | ICD-10-CM

## 2020-11-18 DIAGNOSIS — G2581 Restless legs syndrome: Secondary | ICD-10-CM

## 2020-11-18 DIAGNOSIS — D649 Anemia, unspecified: Secondary | ICD-10-CM | POA: Diagnosis not present

## 2020-11-18 DIAGNOSIS — E039 Hypothyroidism, unspecified: Secondary | ICD-10-CM

## 2020-11-18 DIAGNOSIS — I10 Essential (primary) hypertension: Secondary | ICD-10-CM

## 2020-11-18 MED ORDER — FLUTICASONE PROPIONATE 50 MCG/ACT NA SUSP: 2.0000 | Freq: Every day | NASAL | 3 refills | Status: DC

## 2020-11-18 NOTE — Assessment & Plan Note (Signed)
Renal function on recent lab work stable.

## 2020-11-18 NOTE — Patient Instructions (Signed)
Great to see you today! Use tylenol/heat/ice as needed for the neck. Let me know if this gets worse.  We'll be in touch with labs and recommendations from there.

## 2020-11-18 NOTE — Assessment & Plan Note (Signed)
Recently had cardioversion due to symptomatic A. fib.  Continues on amiodarone.  He has remained in sinus rhythm since cardioversion.

## 2020-11-18 NOTE — Progress Notes (Signed)
Samuel Mcconnell - 87 y.o. male MRN [id number]  Date of birth: 07-07-1927  Subjective Chief Complaint  Patient presents with   Hospitalization Follow-up    HPI Samuel Mcconnell is a 87 year old male here today for routine follow-up visit.  He recently had cardioversion due to symptomatic atrial fibrillation.  He has done well since having this completed.  He continues on amiodarone for rhythm control as well as apixaban for anticoagulation.    He has noted some increased difficulty with sleeping.  Reports that he has had some issues with what seems to be restless legs at night.  He has tried Tylenol PM with intermittent relief.  He did have recent TSH which was stable.  He is taking levothyroxine daily.  He needs renewal of Flonase.  ROS:  A comprehensive ROS was completed and negative except as noted per HPI  Allergies  Allergen Reactions   Clindamycin/Lincomycin Other (See Comments)    Pt felt weird     Dabigatran Etexilate Mesylate Other (See Comments)    "felt weird"   Doxycycline Other (See Comments)    Dyspepsia    Past Medical History:  Diagnosis Date   Atrial fibrillation ([place])    radiofrequency ablation   Emphysema    Hypertension    Thyroid disease     Past Surgical History:  Procedure Laterality Date   CARDIAC SURGERY     CARDIOVERSION N/A 10/24/2019   Procedure: CARDIOVERSION;  Surgeon: Sanda Klein, MD;  Location: [place];  Service: Cardiovascular;  Laterality: N/A;   CARDIOVERSION N/A 10/22/2020   Procedure: CARDIOVERSION;  Surgeon: Thayer Headings, MD;  Location: [place];  Service: Cardiovascular;  Laterality: N/A;   COLON SURGERY  1999   KNEE ARTHROSCOPY Left 07/2000   LOOP RECORDER IMPLANT  01/16/2010   Medtronic Reveal XT (Dr. Norlene Duel)    RADIOFREQUENCY ABLATION  06/13/2009   SHOULDER ARTHROSCOPY WITH ROTATOR CUFF REPAIR Left 04/1996   TIBIAL TUBERCLERPLASTY  ~ 10/2010   TRANSTHORACIC ECHOCARDIOGRAM  11/19/2011   EF=>55%; mild MR, mild mitral annular  calcif; mild TR, normal RSVP; AV mildly sclerotic, mild AV regurg; trace pulm valve regurg     Social History   Socioeconomic History   Marital status: Married    Spouse name: Samuel Mcconnell   Number of children: 2   Years of education: 16   Highest education level: Bachelor's degree (e.g., BA, AB, BS)  Occupational History   Occupation: Retired  Tobacco Use   Smoking status: Former    Years: 46.00    Types: Cigarettes    Quit date: 03/14/2002    Years since quitting: 18.6   Smokeless tobacco: Never   Tobacco comments:    40 pack year history   Vaping Use   Vaping Use: Never used  Substance and Sexual Activity   Alcohol use: No   Drug use: No   Sexual activity: Not Currently    Partners: Female  Other Topics Concern   Not on file  Social History Narrative   Lives at [employer] with his wife. He is still driving. He enjoys golf and gardening (when is he is able to).   Social Determinants of Health   Financial Resource Strain: Low Risk    Difficulty of Paying Living Expenses: Not hard at all  Food Insecurity: No Food Insecurity   Worried About Charity fundraiser in the Last Year: Never true   Ran Out of Food in the Last Year: Never true  Transportation Needs: No Transportation Needs  Lack of Transportation (Medical): No   Lack of Transportation (Non-Medical): No  Physical Activity: Sufficiently Active   Days of Exercise per Week: 4 days   Minutes of Exercise per Session: 50 min  Stress: No Stress Concern Present   Feeling of Stress : Not at all  Social Connections: Socially Integrated   Frequency of Communication with Friends and Family: More than three times a week   Frequency of Social Gatherings with Friends and Family: More than three times a week   Attends Religious Services: More than 4 times per year   Active Member of [employer] or Organizations: Yes   Attends Archivist Meetings: Never   Marital Status: Married    Family History  Problem Relation  Age of Onset   Heart failure Father        MI - died @ 63   Heart failure Mother    Diabetes Son    Pancreatitis Son        also ETOH abuse    Cancer Maternal Grandmother    Heart disease Maternal Grandfather    Atrial fibrillation Brother        dx'ed age 24   Atrial fibrillation Brother        dx'ed age 65   Heart failure Brother    Atrial fibrillation Sister    Hypertension Sister    Heart Problems Sister    Diabetes Sister    Heart failure Sister     Health Maintenance  Topic Date Due   COVID-19 Vaccine (5 - Booster for [place] series) 11/10/2020   TETANUS/TDAP  09/06/2029   INFLUENZA VACCINE  Completed   PNA vac Low Risk Adult  Completed   Zoster Vaccines- Shingrix  Completed   HPV VACCINES  Aged Out     ----------------------------------------------------------------------------------------------------------------------------------------------------------------------------------------------------------------- Physical Exam BP 104/63 (BP Location: Left Arm, Patient Position: Sitting, Cuff Size: Large)   Pulse (!) 58   Ht '5\' 11"'$  (1.803 m)   Wt 186 lb (84.4 kg)   SpO2 95%   BMI 25.94 kg/m   Physical Exam Constitutional:      Appearance: Normal appearance.  HENT:     Head: Normocephalic and atraumatic.  Eyes:     General: No scleral icterus. Cardiovascular:     Rate and Rhythm: Normal rate and regular rhythm.  Pulmonary:     Effort: Pulmonary effort is normal.     Breath sounds: Normal breath sounds.  Musculoskeletal:     Cervical back: Neck supple.     Comments: 1+ b/l edema  Neurological:     Mental Status: He is alert.  Psychiatric:        Mood and Affect: Mood normal.        Behavior: Behavior normal.    ------------------------------------------------------------------------------------------------------------------------------------------------------------------------------------------------------------------- Assessment and  Plan  Hypothyroid Recent TSH normal.  Continue levothyroxine at current strength.  PAF (paroxysmal atrial fibrillation) ([place]) Recently had cardioversion due to symptomatic A. fib.  Continues on amiodarone.  He has remained in sinus rhythm since cardioversion.  CKD (chronic kidney disease) stage 3, GFR 30-59 ml/min (HCC) Renal function on recent lab work stable.  Restless legs Checking ferritin levels.  We can consider trial of Mirapex or Requip if these are normal.  Allergic rhinitis Flonase renewed.   Meds ordered this encounter  Medications   fluticasone (FLONASE) 50 MCG/ACT nasal spray    Sig: Place 2 sprays into both nostrils daily.    Dispense:  16 g    Refill:  3  Return in about 6 months (around 05/20/2021) for Hypothyroid.    This visit occurred during the SARS-CoV-2 public health emergency.  Safety protocols were in place, including screening questions prior to the visit, additional usage of staff PPE, and extensive cleaning of exam room while observing appropriate contact time as indicated for disinfecting solutions.

## 2020-11-18 NOTE — Assessment & Plan Note (Signed)
Checking ferritin levels.  We can consider trial of Mirapex or Requip if these are normal.

## 2020-11-18 NOTE — Assessment & Plan Note (Signed)
Flonase renewed.

## 2020-11-18 NOTE — Assessment & Plan Note (Signed)
>>  ASSESSMENT AND PLAN FOR PAF (PAROXYSMAL ATRIAL FIBRILLATION) (HCC) WRITTEN ON 11/18/2020 10:01 PM BY MATTHEWS, CODY, DO  Recently had cardioversion due to symptomatic A. fib.  Continues on amiodarone .  He has remained in sinus rhythm since cardioversion.

## 2020-11-18 NOTE — Assessment & Plan Note (Signed)
Recent TSH normal.  Continue levothyroxine at current strength.

## 2020-11-19 LAB — FERRITIN: Ferritin: 98 ng/mL (ref 24–380)

## 2020-11-21 ENCOUNTER — Other Ambulatory Visit: Payer: Self-pay | Admitting: Family Medicine

## 2020-11-21 MED ORDER — ROPINIROLE HCL 0.25 MG PO TABS: 0.2500 mg | ORAL_TABLET | Freq: Every day | ORAL | 1 refills | Status: DC

## 2020-11-27 ENCOUNTER — Telehealth: Payer: Self-pay | Admitting: Lab

## 2020-11-27 NOTE — Chronic Care Management (AMB) (Signed)
  Chronic Care Management   Note  11/27/2020 Name: Samuel Mcconnell MRN: [id number] DOB: 21-Apr-1927  Samuel Mcconnell is a 87 y.o. year old male who is a primary care patient of Luetta Nutting, DO. I reached out to Samuel Mcconnell by phone today in response to a referral sent by [name]'s PCP, Luetta Nutting, DO.   [name] was given information about Chronic Care Management services today including:  CCM service includes personalized support from designated clinical staff supervised by his physician, including individualized plan of care and coordination with other care providers 24/7 contact phone numbers for assistance for urgent and routine care needs. Service will only be billed when office clinical staff spend 20 minutes or more in a month to coordinate care. Only one practitioner may furnish and bill the service in a calendar month. The patient may stop CCM services at any time (effective at the end of the month) by phone call to the office staff.   Patient agreed to services and verbal consent obtained.   Follow up plan:   [place]

## 2020-11-28 DIAGNOSIS — Z20828 Contact with and (suspected) exposure to other viral communicable diseases: Secondary | ICD-10-CM | POA: Diagnosis not present

## 2020-12-02 DIAGNOSIS — Z20828 Contact with and (suspected) exposure to other viral communicable diseases: Secondary | ICD-10-CM | POA: Diagnosis not present

## 2020-12-12 DIAGNOSIS — Z20828 Contact with and (suspected) exposure to other viral communicable diseases: Secondary | ICD-10-CM | POA: Diagnosis not present

## 2020-12-17 ENCOUNTER — Telehealth: Payer: Self-pay | Admitting: Cardiovascular Disease

## 2020-12-17 NOTE — Telephone Encounter (Signed)
Patient has been moved to 10/6 with his wife.

## 2020-12-17 NOTE — Telephone Encounter (Signed)
Patient called in to reschedule his appointment for 09/30 with Dr. Sallyanne Kuster due to the planned inclement weather. I offered the next available, 02/21/21, and he declined. He requested 10/06 because his wife is scheduled for that day, but I informed him that his schedule is completely full. He insists on speaking with Dr. Victorino December nurse to be worked in for Orthoptist.

## 2020-12-19 DIAGNOSIS — Z8616 Personal history of COVID-19: Secondary | ICD-10-CM | POA: Diagnosis not present

## 2020-12-20 ENCOUNTER — Ambulatory Visit: Payer: Medicare HMO | Admitting: Cardiovascular Disease

## 2020-12-26 ENCOUNTER — Ambulatory Visit: Payer: Medicare HMO | Admitting: Cardiovascular Disease

## 2020-12-26 DIAGNOSIS — Z20828 Contact with and (suspected) exposure to other viral communicable diseases: Secondary | ICD-10-CM | POA: Diagnosis not present

## 2020-12-31 ENCOUNTER — Telehealth: Payer: Self-pay

## 2020-12-31 NOTE — Chronic Care Management (AMB) (Signed)
Chronic Care Management Pharmacy Assistant   Name: Samuel Mcconnell  MRN: [id number] DOB: 01/07/1928  Samuel Mcconnell is an 87 y.o. year old male who presents for his initial CCM visit with the clinical pharmacist.   Recent office visits:  11/18/20 Samuel Nutting DO - Seen for hypothyroidism - No medication changes noted - Follow up in 6 months  09/16/20 Samuel Nutting DO - Seen for medicare annual wellness exam - No medication changes noted - Follow up in 1 year   Recent consult visits:  10/29/20 Samuel Barre PA - Cardiology - EKG ordered - No medication changes noted - Follow up with Dr Samuel Mcconnell  10/14/20 Samuel Barre PA - Cardiology - Labs ordered - Cardioversion scheduled for Tuesday, August 2nd - No medication changes noted - Follow up in the AF clinic one week post DCCV.   07/23/20 Samuel Mcconnell - Dermatology - Seen for seborrheic keratosis - No notes available   Hospital visits:   Admitted to the hospital on 10/22/20 due to cardioversion. Discharge date was 10/22/20. Discharged from [place]?Medications Started at [facility] Discharge:?? N/a  Medication Changes at Hospital Discharge: N/a  Medications Discontinued at Hospital Discharge: N/a  Medications that remain the same after Hospital Discharge:??  -All other medications will remain the same.    Admitted to the hospital on 08/26/20 due to Fatigue. Discharge date was 08/26/20. Discharged from [facility]?Medications Started at [facility] Discharge:?? -started benzonatate 200 MG capsule 1 capsule by mouth 3 times daily as needed for up to 7 days   Medication Changes at Hospital Discharge: N/a  Medications Discontinued at Hospital Discharge: N/a  Medications that remain the same after Hospital Discharge:??  -All other medications will remain the same.    Medications: Outpatient Encounter Medications as of 12/31/2020  Medication Sig   acetaminophen (TYLENOL) 500 MG  tablet Take 500 mg by mouth daily as needed for moderate pain.   amiodarone (PACERONE) 200 MG tablet Take 1 tablet (200 mg total) by mouth daily.   apixaban (ELIQUIS) 2.5 MG TABS tablet Take 1 tablet (2.5 mg total) by mouth 2 (two) times daily.   Boswellia-Glucosamine-Vit D (OSTEO BI-FLEX ONE PER DAY) TABS Take 1 tablet by mouth daily.   Cholecalciferol (DIALYVITE VITAMIN D 5000) 125 MCG (5000 UT) capsule Take 5,000 Units by mouth daily with lunch.   diphenhydramine-acetaminophen (TYLENOL PM) 25-500 MG TABS tablet Take 1 tablet by mouth at bedtime.   fexofenadine (ALLEGRA) 180 MG tablet Take 180 mg by mouth at bedtime.   fluticasone (FLONASE) 50 MCG/ACT nasal spray Place 2 sprays into both nostrils daily.   Fluticasone-Salmeterol (ADVAIR) 250-50 MCG/DOSE AEPB Inhale 1 puff into the lungs 2 (two) times daily.   furosemide (LASIX) 20 MG tablet TAKE 1 TABLET EVERY DAY AS NEEDED FOR  EDEMA   Ketotifen Fumarate (ITCHY EYE DROPS OP) Place 1 drop into both eyes daily as needed (itchy eyes).   levothyroxine (SYNTHROID) 137 MCG tablet TAKE 1 TABLET (137 MCG TOTAL) BY MOUTH DAILY BEFORE BREAKFAST.   losartan (COZAAR) 50 MG tablet TAKE 1 TABLET EVERY DAY   Multiple Vitamin (MULTIVITAMIN) capsule Take 1 capsule by mouth daily.   Naphazoline HCl (CLEAR EYES OP) Place 1 drop into both eyes daily as needed (redness).   rOPINIRole (REQUIP) 0.25 MG tablet Take 1 tablet (0.25 mg total) by mouth at bedtime. Take 2 hours before bed   vitamin C (ASCORBIC ACID) 500  MG tablet Take 500 mg by mouth daily with lunch.   zinc gluconate 50 MG tablet Take 50 mg by mouth daily with lunch.   No facility-administered encounter medications on file as of 12/31/2020.   acetaminophen (TYLENOL) 500 MG tablet - Last fill 10/17/20 amiodarone (PACERONE) 200 MG tablet- Last fill 11/20/2020 90 DS apixaban (ELIQUIS) 2.5 MG TABS tablet- Last fill 11/05/2020 90 DS Boswellia-Glucosamine-Vit D (OSTEO BI-FLEX ONE PER DAY) TABS- Last fill  10/17/20 Cholecalciferol (DIALYVITE VITAMIN D 5000) 125 MCG (5000 UT) capsule- Last fill 10/17/20 diphenhydramine-acetaminophen (TYLENOL PM) 25-500 MG TABS tablet- Last fill 10/14/20 fexofenadine (ALLEGRA) 180 MG tablet- Last fill 10/17/20 fluticasone (FLONASE) 50 MCG/ACT nasal spray- Last fill 11/18/2020 90 DS Fluticasone-Salmeterol (ADVAIR) 250-50 MCG/DOSE AEPB- Last fill  11/20/2020 90 DS furosemide (LASIX) 20 MG tablet- Last fill 10/18/2020 90 DS Ketotifen Fumarate (ITCHY EYE DROPS OP) - Last fill 10/17/20 levothyroxine (SYNTHROID) 137 MCG tablet- Last fill 10/03/2020 90 DS losartan (COZAAR) 50 MG tablet- Last fill 11/20/2020 90 DS Multiple Vitamin (MULTIVITAMIN) capsule- Last fill 03/14/13 Naphazoline HCl (CLEAR EYES OP) - Last fill 10/17/20 rOPINIRole (REQUIP) 0.25 MG tablet- Last fill 11/21/2020 60 DS vitamin C (ASCORBIC ACID) 500 MG tablet- Last fill 10/17/20 zinc gluconate 50 MG tablet- Last fill 10/17/20   Care Gaps: Covid 19 Vaccine: Overdue since 11/10/2020 (Dose 5 - Booster for [employer] series)  Star Rating Drugs: losartan (COZAAR) 50 MG tablet- Last fill 11/20/2020 90 DS  Samuel Mcconnell, CMA

## 2021-01-01 ENCOUNTER — Telehealth: Payer: Self-pay | Admitting: Family Medicine

## 2021-01-01 NOTE — Telephone Encounter (Signed)
Per Dr. Zigmund Daniel, please call the patient and offer the last slot available for tomorrow. 01/01/21.   Or you can offer to call him tomorrow for [place]'s acute slot.

## 2021-01-01 NOTE — Telephone Encounter (Signed)
Patient would like to know if he can be worked in Architectural technologist around 2:30pm. Michela Pitcher it is congestion in head in chest. Wanted to ask if he could be seen. Please advise.

## 2021-01-02 ENCOUNTER — Encounter: Payer: Self-pay | Admitting: Family Medicine

## 2021-01-02 ENCOUNTER — Other Ambulatory Visit: Payer: Self-pay

## 2021-01-02 ENCOUNTER — Ambulatory Visit (INDEPENDENT_AMBULATORY_CARE_PROVIDER_SITE_OTHER): Payer: Medicare HMO

## 2021-01-02 ENCOUNTER — Ambulatory Visit (INDEPENDENT_AMBULATORY_CARE_PROVIDER_SITE_OTHER): Payer: Medicare HMO | Admitting: Family Medicine

## 2021-01-02 VITALS — BP 93/55 | HR 57 | Ht 71.0 in | Wt 187.0 lb

## 2021-01-02 DIAGNOSIS — R051 Acute cough: Secondary | ICD-10-CM | POA: Diagnosis not present

## 2021-01-02 DIAGNOSIS — R5383 Other fatigue: Secondary | ICD-10-CM

## 2021-01-02 DIAGNOSIS — H6123 Impacted cerumen, bilateral: Secondary | ICD-10-CM | POA: Diagnosis not present

## 2021-01-02 DIAGNOSIS — I480 Paroxysmal atrial fibrillation: Secondary | ICD-10-CM | POA: Diagnosis not present

## 2021-01-02 DIAGNOSIS — H6981 Other specified disorders of Eustachian tube, right ear: Secondary | ICD-10-CM | POA: Diagnosis not present

## 2021-01-02 DIAGNOSIS — R059 Cough, unspecified: Secondary | ICD-10-CM | POA: Diagnosis not present

## 2021-01-02 DIAGNOSIS — J189 Pneumonia, unspecified organism: Secondary | ICD-10-CM | POA: Diagnosis not present

## 2021-01-02 MED ORDER — AMOXICILLIN-POT CLAVULANATE 875-125 MG PO TABS: 1.0000 | ORAL_TABLET | Freq: Two times a day (BID) | ORAL | 0 refills | Status: DC

## 2021-01-02 MED ORDER — AZITHROMYCIN 250 MG PO TABS: ORAL_TABLET | ORAL | 0 refills | Status: AC

## 2021-01-02 NOTE — Telephone Encounter (Signed)
Appointment has been made with PCP. No further questions at this time.

## 2021-01-02 NOTE — Patient Instructions (Signed)
Stay well hydrated.  You can try delsym as needed for cough.  I have sent over antibiotic.

## 2021-01-02 NOTE — Progress Notes (Signed)
Samuel Mcconnell - 87 y.o. male MRN [id number]  Date of birth: 1927-08-11  Subjective Chief Complaint  Patient presents with   chest congestion    HPI Samuel Mcconnell is a 87 year old male here today with complaint of cough and congestion.  Reports that symptoms started a few days ago.  He has had drainage into his chest.  Cough is nonproductive.  He denies shortness of breath or wheezing.  He does feel more tired than usual.  He has not had fever, chills, chest pain or palpitations. He is not currently taking anything for management of symptoms.  ROS:  A comprehensive ROS was completed and negative except as noted per HPI  Allergies  Allergen Reactions   Clindamycin/Lincomycin Other (See Comments)    Pt felt weird     Dabigatran Etexilate Mesylate Other (See Comments)    "felt weird"   Doxycycline Other (See Comments)    Dyspepsia    Past Medical History:  Diagnosis Date   Atrial fibrillation ([place])    radiofrequency ablation   Emphysema    Hypertension    Thyroid disease     Past Surgical History:  Procedure Laterality Date   CARDIAC SURGERY     CARDIOVERSION N/A 10/24/2019   Procedure: CARDIOVERSION;  Surgeon: Sanda Klein, MD;  Location: [place];  Service: Cardiovascular;  Laterality: N/A;   CARDIOVERSION N/A 10/22/2020   Procedure: CARDIOVERSION;  Surgeon: Thayer Headings, MD;  Location: [facility];  Service: Cardiovascular;  Laterality: N/A;   COLON SURGERY  1999   KNEE ARTHROSCOPY Left 07/2000   LOOP RECORDER IMPLANT  01/16/2010   Medtronic Reveal XT (Dr. Norlene Duel)    RADIOFREQUENCY ABLATION  06/13/2009   SHOULDER ARTHROSCOPY WITH ROTATOR CUFF REPAIR Left 04/1996   TIBIAL TUBERCLERPLASTY  ~ 10/2010   TRANSTHORACIC ECHOCARDIOGRAM  11/19/2011   EF=>55%; mild MR, mild mitral annular calcif; mild TR, normal RSVP; AV mildly sclerotic, mild AV regurg; trace pulm valve regurg     Social History   Socioeconomic History   Marital status: Married    Spouse name: Samuel Mcconnell    Number of children: 2   Years of education: 16   Highest education level: Bachelor's degree (e.g., BA, AB, BS)  Occupational History   Occupation: Retired  Tobacco Use   Smoking status: Former    Years: 46.00    Types: Cigarettes    Quit date: 03/14/2002    Years since quitting: 18.8   Smokeless tobacco: Never   Tobacco comments:    40 pack year history   Vaping Use   Vaping Use: Never used  Substance and Sexual Activity   Alcohol use: No   Drug use: No   Sexual activity: Not Currently    Partners: Female  Other Topics Concern   Not on file  Social History Narrative   Lives at [place] [facility] with his wife. He is still driving. He enjoys golf and gardening (when is he is able to).   Social Determinants of Health   Financial Resource Strain: Low Risk    Difficulty of Paying Living Expenses: Not hard at all  Food Insecurity: No Food Insecurity   Worried About Charity fundraiser in the Last Year: Never true   [place] in the Last Year: Never true  Transportation Needs: No Transportation Needs   Lack of Transportation (Medical): No   Lack of Transportation (Non-Medical): No  Physical Activity: Sufficiently Active   Days of Exercise per Week: 4 days   Minutes  of Exercise per Session: 50 min  Stress: No Stress Concern Present   Feeling of Stress : Not at all  Social Connections: Socially Integrated   Frequency of Communication with Friends and Family: More than three times a week   Frequency of Social Gatherings with Friends and Family: More than three times a week   Attends Religious Services: More than 4 times per year   Active Member of [employer] or Organizations: Yes   Attends Archivist Meetings: Never   Marital Status: Married    Family History  Problem Relation Age of Onset   Heart failure Father        MI - died @ 67   Heart failure Mother    Diabetes Son    Pancreatitis Son        also ETOH abuse    Cancer Maternal Grandmother    Heart  disease Maternal Grandfather    Atrial fibrillation Brother        dx'ed age 73   Atrial fibrillation Brother        dx'ed age 75   Heart failure Brother    Atrial fibrillation Sister    Hypertension Sister    Heart Problems Sister    Diabetes Sister    Heart failure Sister     Health Maintenance  Topic Date Due   COVID-19 Vaccine (5 - Booster for [place] series) 11/10/2020   TETANUS/TDAP  09/06/2029   INFLUENZA VACCINE  Completed   Zoster Vaccines- Shingrix  Completed   HPV VACCINES  Aged Out     ----------------------------------------------------------------------------------------------------------------------------------------------------------------------------------------------------------------- Physical Exam BP (!) 93/55 (BP Location: Left Arm, Patient Position: Sitting, Cuff Size: Normal)   Pulse (!) 57   Ht 5\' 11"  (1.803 m)   Wt 187 lb (84.8 kg)   SpO2 93%   BMI 26.08 kg/m   Physical Exam Constitutional:      Appearance: Normal appearance.  Eyes:     General: No scleral icterus. Cardiovascular:     Rate and Rhythm: Normal rate. Rhythm irregular.  Pulmonary:     Effort: Pulmonary effort is normal.     Comments: Slightly diminished breath sounds in the right lower lobe. Musculoskeletal:     Cervical back: Neck supple.  Neurological:     General: No focal deficit present.     Mental Status: He is alert.  Psychiatric:        Mood and Affect: Mood normal.        Behavior: Behavior normal.   EKG: Sinus rhythm with sinus arrhythmia.  Right bundle branch block. ------------------------------------------------------------------------------------------------------------------------------------------------------------------------------------------------------------------- Assessment and Plan  Community acquired pneumonia Chest x-ray ordered and there appears to be developing infiltrate in the right lower lobe.  Starting combination of Augmentin with  azithromycin.  He may use Delsym as needed for cough.  Instructed to contact clinic if having new or worsening symptoms.   Meds ordered this encounter  Medications   amoxicillin-clavulanate (AUGMENTIN) 875-125 MG tablet    Sig: Take 1 tablet by mouth 2 (two) times daily.    Dispense:  20 tablet    Refill:  0   azithromycin (ZITHROMAX) 250 MG tablet    Sig: Take 2 tablets on day 1, then 1 tablet daily on days 2 through 5    Dispense:  6 tablet    Refill:  0    No follow-ups on file.    This visit occurred during the SARS-CoV-2 public health emergency.  Safety protocols were in place, including screening questions  prior to the visit, additional usage of staff PPE, and extensive cleaning of exam room while observing appropriate contact time as indicated for disinfecting solutions.

## 2021-01-02 NOTE — Assessment & Plan Note (Signed)
Chest x-ray ordered and there appears to be developing infiltrate in the right lower lobe.  Starting combination of Augmentin with azithromycin.  He may use Delsym as needed for cough.  Instructed to contact clinic if having new or worsening symptoms.

## 2021-01-06 ENCOUNTER — Other Ambulatory Visit: Payer: Self-pay | Admitting: Cardiovascular Disease

## 2021-01-06 ENCOUNTER — Ambulatory Visit (INDEPENDENT_AMBULATORY_CARE_PROVIDER_SITE_OTHER): Payer: Medicare HMO | Admitting: Pharmacist

## 2021-01-06 DIAGNOSIS — I10 Essential (primary) hypertension: Secondary | ICD-10-CM | POA: Diagnosis not present

## 2021-01-06 DIAGNOSIS — J449 Chronic obstructive pulmonary disease, unspecified: Secondary | ICD-10-CM

## 2021-01-06 DIAGNOSIS — I480 Paroxysmal atrial fibrillation: Secondary | ICD-10-CM

## 2021-01-06 NOTE — Patient Instructions (Signed)
Visit Information   PATIENT GOALS:   Goals Addressed             This Visit's Progress    Medication Management       Patient Goals/Self-Care Activities Over the next 30 days, patient will:  take medications as prescribed  Follow Up Plan: The care management team will reach out to the patient again over the next 30 days.         Consent to CCM Services: Mr. Fullman was given information about Chronic Care Management services including:  CCM service includes personalized support from designated clinical staff supervised by his physician, including individualized plan of care and coordination with other care providers 24/7 contact phone numbers for assistance for urgent and routine care needs. Service will only be billed when office clinical staff spend 20 minutes or more in a month to coordinate care. Only one practitioner may furnish and bill the service in a calendar month. The patient may stop CCM services at any time (effective at the end of the month) by phone call to the office staff. The patient will be responsible for cost sharing (co-pay) of up to 20% of the service fee (after annual deductible is met).  Patient agreed to services and verbal consent obtained.   Patient verbalizes understanding of instructions provided today and agrees to view in MyChart.   Telephone follow up appointment with care management team member scheduled for: 1 month  Keesha J Kline   CLINICAL CARE PLAN: Patient Care Plan: Medication Management     Problem Identified: HTN, COPD, Afib      Long-Range Goal: Disease Progression Prevention   Start Date: 01/06/2021  This Visit's Progress: On track  Priority: High  Note:   Current Barriers:  Unable to self administer medications as prescribed Receives help from daughter, takes medicine   Pharmacist Clinical Goal(s):  Over the next 30 days, patient will maintain control of chronic conditions as evidenced by medication fill history, lab  values, and vital signs  contact provider office for questions/concerns as evidenced notation of same in electronic health record through collaboration with PharmD and provider.   Interventions: 1:1 collaboration with Matthews, Cody, DO regarding development and update of comprehensive plan of care as evidenced by provider attestation and co-signature Inter-disciplinary care team collaboration (see longitudinal plan of care) Comprehensive medication review performed; medication list updated in electronic medical record  Hypertension:  Controlled; current treatment:losartan 50mg daily, amiodarone 200mg daily, furosemide 20mg daily;   Current home readings: to be discussed at future visits  Denies hypotensive/hypertensive symptoms  Recommended continue current regimen,  Chronic Obstructive Pulmonary Disease:  Controlled; current treatment:advair BID;   0 exacerbations requiring treatment in the last 6 months   Recommended continue current regimen Atrial Fibrillation:  Controlled; current rate/rhythm control: amiodarone 200mg daily; anticoagulant treatment: apixaban 2.5mg BID  Recommended continue current regimen  Patient Goals/Self-Care Activities Over the next 30 days, patient will:  take medications as prescribed  Follow Up Plan: The care management team will reach out to the patient again over the next 30 days.       

## 2021-01-06 NOTE — Progress Notes (Signed)
Chronic Care Management Pharmacy Note  01/06/2021 Name:  Samuel Mcconnell MRN:  [id number] DOB:  Jan 04, 1928  Summary: addressed HTN, COPD, afib. Discussed medications with patient briefly who expressed no issues, questions or concerns. Patient described that daughter assists with getting medications from the pharmacy. Attempted to contact Hassan Rowan via listed cell-phone in chart, but unsuccessful and left voicemail.  Recommendations/Changes made from today's visit: none, continue current regimen, pending more thorough discussion w/patient's daughter  Plan: pharmacist will try to speak with daughter for f/u within 1 month  Subjective: Samuel Mcconnell is an 87 y.o. year old male who is a primary patient of Luetta Nutting, DO.  The CCM team was consulted for assistance with disease management and care coordination needs.    Engaged with patient by telephone for initial visit in response to provider referral for pharmacy case management and/or care coordination services.   Consent to Services:  The patient was given information about Chronic Care Management services, agreed to services, and gave verbal consent prior to initiation of services.  Please see initial visit note for detailed documentation.   Patient Care Team: Luetta Nutting, DO as PCP - General (Family Medicine) Sanda Klein, MD as PCP - Cardiology (Cardiology) [facility]ump, [facility] as Pharmacist (Pharmacist)  Recent office visits:  11/18/20 Luetta Nutting DO - Seen for hypothyroidism - No medication changes noted - Follow up in 6 months  09/16/20 Luetta Nutting DO - Seen for medicare annual wellness exam - No medication changes noted - Follow up in 1 year    Recent consult visits:  10/29/20 Kewon Barre PA - Cardiology - EKG ordered - No medication changes noted - Follow up with Dr Sallyanne Kuster  10/14/20 Thang Barre PA - Cardiology - Labs ordered - Cardioversion scheduled for Tuesday, August 2nd - No medication changes noted - Follow up  in the AF clinic one week post DCCV.   07/23/20 Jolene Schimke - Dermatology - Seen for seborrheic keratosis - No notes available    Hospital visits:    Admitted to the hospital on 10/22/20 due to cardioversion. Discharge date was 10/22/20. Discharged from [place]?Medications Started at [facility] Discharge:?? N/a   Medication Changes at Hospital Discharge: N/a   Medications Discontinued at Hospital Discharge: N/a   Medications that remain the same after Hospital Discharge:??  -All other medications will remain the same.     Admitted to the hospital on 08/26/20 due to Fatigue. Discharge date was 08/26/20. Discharged from [facility]?Medications Started at [facility] Discharge:?? -started benzonatate 200 MG capsule 1 capsule by mouth 3 times daily as needed for up to 7 days    Medication Changes at Hospital Discharge: N/a   Medications Discontinued at Hospital Discharge: N/a   Medications that remain the same after Hospital Discharge:??  -All other medications will remain the same.    Objective:  Lab Results  Component Value Date   CREATININE 1.59 (H) 10/14/2020   CREATININE 1.57 (H) 05/07/2020   CREATININE 1.53 (H) 02/29/2020    No results found for: HGBA1C Last diabetic Eye exam: No results found for: HMDIABEYEEXA  Last diabetic Foot exam: No results found for: HMDIABFOOTEX   No results found for: CHOL, TRIG, HDL, CHOLHDL, VLDL, LDLCALC, LDLDIRECT  Hepatic Function Latest Ref Rng & Units 10/14/2020 05/07/2020 02/29/2020  Total Protein 6.5 - 8.1 g/dL 6.4(L) 6.5 5.9(L)  Albumin 3.5 - 5.0 g/dL 3.7 - 3.6  AST 15 - 41 U/L 27 29 29   ALT 0 - 44 U/L 25 25 23   Alk Phosphatase 38 - 126 U/L 49 - 52  Total Bilirubin 0.3 - 1.2 mg/dL 0.9 0.4 0.7    Lab Results  Component Value Date/Time   TSH 3.256 10/14/2020 02:54 PM   TSH 3.82 06/19/2020 12:08 PM   TSH 6.96 (H) 05/07/2020 11:30 AM    CBC Latest Ref Rng & Units  10/14/2020 05/07/2020 02/29/2020  WBC 4.0 - 10.5 K/uL 9.9 8.4 7.4  Hemoglobin 13.0 - 17.0 g/dL 13.4 13.0(L) 12.0(L)  Hematocrit 39.0 - 52.0 % 41.9 40.2 38.1(L)  Platelets 150 - 400 K/uL 304 284 257     Social History   Tobacco Use  Smoking Status Former   Years: 46.00   Types: Cigarettes   Quit date: 03/14/2002   Years since quitting: 18.8  Smokeless Tobacco Never  Tobacco Comments   40 pack year history    BP Readings from Last 3 Encounters:  01/02/21 (!) 93/55  11/18/20 104/63  10/29/20 112/72   Pulse Readings from Last 3 Encounters:  01/02/21 (!) 57  11/18/20 (!) 58  10/29/20 61   Wt Readings from Last 3 Encounters:  01/02/21 187 lb (84.8 kg)  11/18/20 186 lb (84.4 kg)  10/29/20 188 lb 6.4 oz (85.5 kg)    Assessment: Review of patient past medical history, allergies, medications, health status, including review of consultants reports, laboratory and other test data, was performed as part of comprehensive evaluation and provision of chronic care management services.   SDOH:  (Social Determinants of Health) assessments and interventions performed:    CCM Care Plan  Allergies  Allergen Reactions   Clindamycin/Lincomycin Other (See Comments)    Pt felt weird     Dabigatran Etexilate Mesylate Other (See Comments)    "felt weird"   Doxycycline Other (See Comments)    Dyspepsia    Medications Reviewed Today     Reviewed by Leontine Locket, CMA (Certified Medical Assistant) on 01/02/21 at 1418  Med List Status: <None>   Medication Order Taking? Sig Documenting Provider Last Dose Status Informant  acetaminophen (TYLENOL) 500 MG tablet [id number] Yes Take 500 mg by mouth daily as needed for moderate pain. [provider] Taking Active Self  amiodarone (PACERONE) 200 MG tablet [id number] Yes Take 1 tablet (200 mg total) by mouth daily. Croitoru, Mihai, MD Taking Active Self  apixaban (ELIQUIS) 2.5 MG TABS tablet [id number] Yes Take 1 tablet (2.5 mg total) by  mouth 2 (two) times daily. Croitoru, Mihai, MD Taking Active Self  Boswellia-Glucosamine-Vit D (OSTEO BI-FLEX ONE PER DAY) TABS [id number] Yes Take 1 tablet by mouth daily. [provider] Taking Active Self  Cholecalciferol (DIALYVITE VITAMIN D 5000) 125 MCG (5000 UT) capsule [id number] Yes Take 5,000 Units by mouth daily with lunch. [provider] Taking Active Self  diphenhydramine-acetaminophen (TYLENOL PM) 25-500 MG TABS tablet [id number] Yes Take 1 tablet by mouth at bedtime. [provider] Taking Active Self  fexofenadine (ALLEGRA) 180 MG tablet [id number] Yes Take 180 mg by mouth at bedtime. [provider] Taking Active Self  fluticasone (FLONASE) 50 MCG/ACT nasal spray [id number] Yes Place 2 sprays into both nostrils daily. Luetta Nutting, DO Taking Active   Fluticasone-Salmeterol (ADVAIR) 250-50 MCG/DOSE AEPB [id number] Yes Inhale 1 puff into the lungs 2 (two) times daily. Croitoru, Mihai, MD Taking Active Self  furosemide (LASIX) 20 MG tablet [id number] Yes TAKE 1 TABLET EVERY DAY AS NEEDED FOR  EDEMA  Luetta Nutting, DO Taking Active   Ketotifen Fumarate (ITCHY EYE DROPS OP) [id number] Yes Place 1 drop into both eyes daily as needed (itchy eyes). [provider] Taking Active Self  levothyroxine (SYNTHROID) 137 MCG tablet [id number] Yes TAKE 1 TABLET (137 MCG TOTAL) BY MOUTH DAILY BEFORE BREAKFAST. Luetta Nutting, DO Taking Active Self  losartan (COZAAR) 50 MG tablet [id number] Yes TAKE 1 TABLET EVERY DAY Croitoru, Mihai, MD Taking Active   Multiple Vitamin (MULTIVITAMIN) capsule [id number] Yes Take 1 capsule by mouth daily. [provider] Taking Active Self  Naphazoline HCl (CLEAR EYES OP) [id number] Yes Place 1 drop into both eyes daily as needed (redness). [provider] Taking Active Self  rOPINIRole (REQUIP) 0.25 MG tablet [id number] Yes Take 1 tablet (0.25 mg total) by mouth at bedtime. Take 2 hours before bed Luetta Nutting, DO  Taking Active   vitamin C (ASCORBIC ACID) 500 MG tablet [id number] Yes Take 500 mg by mouth daily with lunch. [provider] Taking Active Self  zinc gluconate 50 MG tablet [id number] Yes Take 50 mg by mouth daily with lunch. [provider] Taking Active Self            Patient Active Problem List   Diagnosis Date Noted   Community acquired pneumonia 01/02/2021   Restless legs 11/18/2020   Persistent atrial fibrillation ([place]) 10/14/2020   Secondary hypercoagulable state ([place]) 10/14/2020   Well adult exam 05/07/2020   Laceration of right forearm 03/13/2020   Cellulitis 03/06/2020   Gait instability 11/26/2019   Hypothyroid 11/26/2019   Atypical atrial flutter ([place])    Fall from slip, trip, or stumble, initial encounter 09/08/2019   Bilateral lower extremity edema 09/08/2019   CSF leak from ear 05/16/2019   Chronic obstructive pulmonary disease ([place]) 09/14/2017   Essential hypertension 09/14/2017   Myringotomy tube status 02/25/2017   LVH (left ventricular hypertrophy) 03/28/2016   Gallbladder calculus without cholecystitis 07/22/2014   Fatty liver 07/22/2014   PAF (paroxysmal atrial fibrillation) ([place]) 03/15/2013   CKD (chronic kidney disease) stage 3, GFR 30-59 ml/min (HCC) 02/21/2013   Allergic rhinitis 06/24/2012    Immunization History  Administered Date(s) Administered   Fluad Quad(high Dose 65+) 01/22/2020, 11/18/2020   Influenza Split 12/25/2010   Influenza, High Dose Seasonal PF 01/14/2015, 12/31/2015, 12/24/2016, 01/21/2018, 01/21/2018, 12/14/2018   Influenza, Seasonal, Injecte, Preservative Fre 01/02/2014   Influenza-Unspecified 12/26/2012   PFIZER(Purple Top)SARS-COV-2 Vaccination 05/06/2019, 05/29/2019, 11/29/2019, 07/11/2020   Pneumococcal Conjugate-13 06/16/2013   Pneumococcal Polysaccharide-23 03/23/2006   Tdap 08/14/2014, 09/07/2019   Zoster Recombinat (Shingrix) 11/11/2017, 03/18/2018   Zoster, Live 08/14/2014    Conditions to be  addressed/monitored: Atrial Fibrillation, HTN, and COPD  There are no care plans that you recently modified to display for this patient.   Medication Assistance:  TBD  Patient's preferred pharmacy is:  [facility], [address] [place] [address] Phone: [phone] Fax: [phone]  [id number]: [phone]: [address] [place] [address]: [phone]: [phone] Uses pill box? Yes Pt endorses 80% compliance  Follow Up:  Patient agrees to Care Plan and Follow-up.  Plan: The care management team will reach out to the patient again over the next 30 days.  KLarinda Buttery PharmD Clinical Pharmacist [facility]  [facility]

## 2021-01-06 NOTE — Telephone Encounter (Signed)
Prescription refill request for Eliquis received. Indication:Afib Last office visit:8/22 Scr:1.5 Age: 87 Weight:84.8 kg  Prescription refilled

## 2021-01-22 DIAGNOSIS — D485 Neoplasm of uncertain behavior of skin: Secondary | ICD-10-CM | POA: Diagnosis not present

## 2021-01-22 DIAGNOSIS — D1801 Hemangioma of skin and subcutaneous tissue: Secondary | ICD-10-CM | POA: Diagnosis not present

## 2021-01-22 DIAGNOSIS — L570 Actinic keratosis: Secondary | ICD-10-CM | POA: Diagnosis not present

## 2021-01-22 DIAGNOSIS — R233 Spontaneous ecchymoses: Secondary | ICD-10-CM | POA: Diagnosis not present

## 2021-01-23 ENCOUNTER — Telehealth: Payer: Self-pay | Admitting: Cardiovascular Disease

## 2021-01-23 ENCOUNTER — Encounter (HOSPITAL_COMMUNITY): Payer: Self-pay | Admitting: Physician Assistant

## 2021-01-23 ENCOUNTER — Other Ambulatory Visit: Payer: Self-pay

## 2021-01-23 ENCOUNTER — Ambulatory Visit (HOSPITAL_COMMUNITY)
Admission: RE | Admit: 2021-01-23 | Discharge: 2021-01-23 | Disposition: A | Discharge status: Home / Self Care | Payer: Medicare HMO | Source: Ambulatory Visit | Attending: Physician Assistant | Admitting: Physician Assistant

## 2021-01-23 VITALS — BP 110/60 | HR 63 | Ht 71.0 in | Wt 187.6 lb

## 2021-01-23 DIAGNOSIS — J449 Chronic obstructive pulmonary disease, unspecified: Secondary | ICD-10-CM | POA: Diagnosis not present

## 2021-01-23 DIAGNOSIS — D6869 Other thrombophilia: Secondary | ICD-10-CM | POA: Diagnosis not present

## 2021-01-23 DIAGNOSIS — Z87891 Personal history of nicotine dependence: Secondary | ICD-10-CM | POA: Insufficient documentation

## 2021-01-23 DIAGNOSIS — I5032 Chronic diastolic (congestive) heart failure: Secondary | ICD-10-CM | POA: Diagnosis not present

## 2021-01-23 DIAGNOSIS — Z8616 Personal history of COVID-19: Secondary | ICD-10-CM | POA: Insufficient documentation

## 2021-01-23 DIAGNOSIS — I130 Hypertensive heart and chronic kidney disease with heart failure and stage 1 through stage 4 chronic kidney disease, or unspecified chronic kidney disease: Secondary | ICD-10-CM | POA: Insufficient documentation

## 2021-01-23 DIAGNOSIS — Z79899 Other long term (current) drug therapy: Secondary | ICD-10-CM | POA: Diagnosis not present

## 2021-01-23 DIAGNOSIS — I10 Essential (primary) hypertension: Secondary | ICD-10-CM

## 2021-01-23 DIAGNOSIS — R42 Dizziness and giddiness: Secondary | ICD-10-CM | POA: Diagnosis not present

## 2021-01-23 DIAGNOSIS — I4819 Other persistent atrial fibrillation: Secondary | ICD-10-CM | POA: Diagnosis not present

## 2021-01-23 DIAGNOSIS — Z7901 Long term (current) use of anticoagulants: Secondary | ICD-10-CM | POA: Insufficient documentation

## 2021-01-23 DIAGNOSIS — N189 Chronic kidney disease, unspecified: Secondary | ICD-10-CM | POA: Diagnosis not present

## 2021-01-23 MED ORDER — LOSARTAN POTASSIUM 50 MG PO TABS: 25.0000 mg | ORAL_TABLET | Freq: Every day | ORAL | 1 refills | Status: DC

## 2021-01-23 NOTE — Patient Instructions (Signed)
Decrease losartan to 25mg  once a day at bedtime (1/2 of the 50mg  tablet)

## 2021-01-23 NOTE — Telephone Encounter (Addendum)
Returned pt. Call about her father being back in A. Fib. She stated his pulse was 107 and BP was 93/74. Daughter states, "Patient had a big day yesterday-many appointments... got up on the middle of the night and got weak and lightheaded" pt. Is still taking his meds.    A. Fib clinic was contacted but did not hear back, consulted MD in office and she recommended patient going to ED to be evaluated.   Called patient back to inform them of the doctors recommendation and daughter said pulse had returned to 64 and pt. Was no longer filling the flutter. Daughter said that if problem returned they would come to the emergency department.

## 2021-01-23 NOTE — Telephone Encounter (Signed)
Reached out to A. Fib clinic to see if they have availability to see patient

## 2021-01-23 NOTE — Progress Notes (Signed)
Primary Care Physician: Luetta Nutting, DO Primary Cardiologist: Dr Sallyanne Kuster  Primary Electrophysiologist: none Referring Physician: Dr Narda Rutherford Weyenberg is a 87 y.o. male with a history of HTN, CKD, chronic diastolic CHF, atypical atrial flutter, atrial fibrillation, and COPD who presents for follow up in the [facility].  The patient was initially diagnosed with atrial fibrillation remotely and had an ablation in 2011. Patient is on Eliquis for a CHADS2VASC score of 4. He did have a DCCV 10/24/19. He was in his usual state of health until 10/13/20 when he started experiencing symptoms of generalized fatigue and lightheadedness. This was similar to his previous afib symptoms. Of note, he did have COVID about one month prior. Patient is s/p DCCV on 10/22/20.   On follow up today, patient had an episode last evening of low BP, elevated heart rate and dizziness. He had taken his Lasix late and had to get up frequently during the night to urinate. He became very dizzy while urinating and slowly lowered himself to the ground. EMS was called and his BP was 93-74 with HR 107. He did say he had some "fluttering" in his chest at the time. He laid on his bed and the symptoms resolved. He feels back to his usual self today. He is in SR.   Today, he denies symptoms of chest pain, shortness of breath, orthopnea, PND, lower extremity edema, presyncope, syncope, snoring, daytime somnolence, bleeding, or neurologic sequela. The patient is tolerating medications without difficulties and is otherwise without complaint today.    Atrial Fibrillation Risk Factors:  he does not have symptoms or diagnosis of sleep apnea. he does not have a history of rheumatic fever. he does not have a history of alcohol use.   he has a BMI of Body mass index is 26.16 kg/m.Marland Kitchen Filed Weights   01/23/21 1444  Weight: 85.1 kg     Family History  Problem Relation Age of Onset   Heart failure  Father        MI - died @ 8   Heart failure Mother    Diabetes Son    Pancreatitis Son        also ETOH abuse    Cancer Maternal Grandmother    Heart disease Maternal Grandfather    Atrial fibrillation Brother        dx'ed age 51   Atrial fibrillation Brother        dx'ed age 36   Heart failure Brother    Atrial fibrillation Sister    Hypertension Sister    Heart Problems Sister    Diabetes Sister    Heart failure Sister      Atrial Fibrillation Management history:  Previous antiarrhythmic drugs: amiodarone  Previous cardioversions: 10/24/19, 10/22/20 Previous ablations: 05/2009 CHADS2VASC score: 4 Anticoagulation history: Eliquis   Past Medical History:  Diagnosis Date   Atrial fibrillation ([place])    radiofrequency ablation   Emphysema    Hypertension    Thyroid disease    Past Surgical History:  Procedure Laterality Date   CARDIAC SURGERY     CARDIOVERSION N/A 10/24/2019   Procedure: CARDIOVERSION;  Surgeon: Sanda Klein, MD;  Location: [facility];  Service: Cardiovascular;  Laterality: N/A;   CARDIOVERSION N/A 10/22/2020   Procedure: CARDIOVERSION;  Surgeon: Thayer Headings, MD;  Location: [facility];  Service: Cardiovascular;  Laterality: N/A;   COLON SURGERY  1999   KNEE ARTHROSCOPY Left 07/2000   LOOP RECORDER IMPLANT  01/16/2010   Medtronic Reveal XT (Dr. Norlene Duel)    RADIOFREQUENCY ABLATION  06/13/2009   SHOULDER ARTHROSCOPY WITH ROTATOR CUFF REPAIR Left 04/1996   TIBIAL TUBERCLERPLASTY  ~ 10/2010   TRANSTHORACIC ECHOCARDIOGRAM  11/19/2011   EF=>55%; mild MR, mild mitral annular calcif; mild TR, normal RSVP; AV mildly sclerotic, mild AV regurg; trace pulm valve regurg     Current Outpatient Medications  Medication Sig Dispense Refill   acetaminophen (TYLENOL) 500 MG tablet Take 500 mg by mouth daily as needed for moderate pain.     amiodarone (PACERONE) 200 MG tablet Take 1 tablet (200 mg total) by mouth daily. 90 tablet 3   amoxicillin-clavulanate  (AUGMENTIN) 875-125 MG tablet Take 1 tablet by mouth 2 (two) times daily. 20 tablet 0   Boswellia-Glucosamine-Vit D (OSTEO BI-FLEX ONE PER DAY) TABS Take 1 tablet by mouth daily.     Cholecalciferol (DIALYVITE VITAMIN D 5000) 125 MCG (5000 UT) capsule Take 5,000 Units by mouth daily with lunch.     diphenhydramine-acetaminophen (TYLENOL PM) 25-500 MG TABS tablet Take 1 tablet by mouth at bedtime.     ELIQUIS 2.5 MG TABS tablet TAKE 1 TABLET TWICE DAILY (DOSE CHANGE) 180 tablet 3   fexofenadine (ALLEGRA) 180 MG tablet Take 180 mg by mouth at bedtime.     fluticasone (FLONASE) 50 MCG/ACT nasal spray Place 2 sprays into both nostrils daily. 16 g 3   Fluticasone-Salmeterol (ADVAIR) 250-50 MCG/DOSE AEPB Inhale 1 puff into the lungs 2 (two) times daily. 180 each 1   furosemide (LASIX) 20 MG tablet TAKE 1 TABLET EVERY DAY AS NEEDED FOR  EDEMA 90 tablet 1   Ketotifen Fumarate (ITCHY EYE DROPS OP) Place 1 drop into both eyes daily as needed (itchy eyes).     levothyroxine (SYNTHROID) 137 MCG tablet TAKE 1 TABLET (137 MCG TOTAL) BY MOUTH DAILY BEFORE BREAKFAST. 90 tablet 2   Multiple Vitamin (MULTIVITAMIN) capsule Take 1 capsule by mouth daily.     Naphazoline HCl (CLEAR EYES OP) Place 1 drop into both eyes daily as needed (redness).     rOPINIRole (REQUIP) 0.25 MG tablet Take 1 tablet (0.25 mg total) by mouth at bedtime. Take 2 hours before bed 30 tablet 1   vitamin C (ASCORBIC ACID) 500 MG tablet Take 500 mg by mouth daily with lunch.     zinc gluconate 50 MG tablet Take 50 mg by mouth daily with lunch.     losartan (COZAAR) 50 MG tablet Take 0.5 tablets (25 mg total) by mouth at bedtime. 90 tablet 1   No current facility-administered medications for this encounter.    Allergies  Allergen Reactions   Clindamycin/Lincomycin Other (See Comments)    Pt felt weird     Dabigatran Etexilate Mesylate Other (See Comments)    "felt weird"   Doxycycline Other (See Comments)    Dyspepsia    Social  History   Socioeconomic History   Marital status: Married    Spouse name: Barnett Applebaum   Number of children: 2   Years of education: 16   Highest education level: Bachelor's degree (e.g., BA, AB, BS)  Occupational History   Occupation: Retired  Tobacco Use   Smoking status: Former    Years: 46.00    Types: Cigarettes    Quit date: 03/14/2002    Years since quitting: 18.8   Smokeless tobacco: Never   Tobacco comments:    Former smoker 01/23/2021  Vaping Use   Vaping Use: Never used  Substance and  Sexual Activity   Alcohol use: No   Drug use: No   Sexual activity: Not Currently    Partners: Female  Other Topics Concern   Not on file  Social History Narrative   Lives at [facility] [place] with his wife. He is still driving. He enjoys golf and gardening (when is he is able to).   Social Determinants of Health   Financial Resource Strain: Low Risk    Difficulty of Paying Living Expenses: Not hard at all  Food Insecurity: No Food Insecurity   Worried About Charity fundraiser in the Last Year: Never true   [place] in the Last Year: Never true  Transportation Needs: No Transportation Needs   Lack of Transportation (Medical): No   Lack of Transportation (Non-Medical): No  Physical Activity: Sufficiently Active   Days of Exercise per Week: 4 days   Minutes of Exercise per Session: 50 min  Stress: No Stress Concern Present   Feeling of Stress : Not at all  Social Connections: Socially Integrated   Frequency of Communication with Friends and Family: More than three times a week   Frequency of Social Gatherings with Friends and Family: More than three times a week   Attends Religious Services: More than 4 times per year   Active Member of [employer] or Organizations: Yes   Attends Archivist Meetings: Never   Marital Status: Married  Human resources officer Violence: Not At Risk   Fear of Current or Ex-Partner: No   Emotionally Abused: No   Physically Abused: No   Sexually  Abused: No     ROS- All systems are reviewed and negative except as per the HPI above.  Physical Exam: Vitals:   01/23/21 1444  BP: 110/60  Pulse: 63  Weight: 85.1 kg  Height: 5\' 11"  (1.803 m)    GEN- The patient is a well appearing elderly male, alert and oriented x 3 today.   HEENT-head normocephalic, atraumatic, sclera clear, conjunctiva pink, hearing intact, trachea midline. Lungs- Clear to ausculation bilaterally, normal work of breathing Heart- Regular rate and rhythm, no murmurs, rubs or gallops  GI- soft, NT, ND, + BS Extremities- no clubbing, cyanosis, or edema MS- no significant deformity or atrophy Skin- no rash or lesion Psych- euthymic mood, full affect Neuro- strength and sensation are intact   Wt Readings from Last 3 Encounters:  01/23/21 85.1 kg  01/02/21 84.8 kg  11/18/20 84.4 kg    EKG today demonstrates  SR, RBBB Vent. rate 63 BPM PR interval 202 ms QRS duration 148 ms QT/QTcB 454/464 ms  Echo 03/26/16 demonstrated  - Left ventricle: The cavity size was normal. There was moderate    concentric hypertrophy. Systolic function was vigorous. The    estimated ejection fraction was in the range of 65% to 70%. Wall    motion was normal; there were no regional wall motion    abnormalities. Doppler parameters are consistent with abnormal    left ventricular relaxation (grade 1 diastolic dysfunction).    Doppler parameters are consistent with indeterminate ventricular    filling pressure.  - Aortic valve: Transvalvular velocity was within the normal range.    There was no stenosis. There was mild regurgitation.  - Mitral valve: Transvalvular velocity was within the normal range.    There was no evidence for stenosis. There was no regurgitation.  - Left atrium: The atrium was moderately dilated.  - Right ventricle: The cavity size was normal. Wall thickness was  normal. Systolic function was normal.  - Tricuspid valve: There was trivial regurgitation.   - Pulmonary arteries: Systolic pressure was within the normal    range.   Epic records are reviewed at length today  CHA2DS2-VASc Score = 4  The patient's score is based upon: CHF History: 1 HTN History: 1 Diabetes History: 0 Stroke History: 0 Vascular Disease History: 0 Age Score: 2 Gender Score: 0     ASSESSMENT AND PLAN: 1. Persistent Atrial Fibrillation/atypical atrial flutter The patient's CHA2DS2-VASc score is 4, indicating a 4.8% annual risk of stroke.   Patient in [place], possible he had a brief episode.  Continue amiodarone 200 mg daily Continue Eliquis 2.5 mg BID  2. Secondary Hypercoagulable State (ICD10:  D68.69) The patient is at significant risk for stroke/thromboembolism based upon his CHA2DS2-VASc Score of 4.  Continue Apixaban (Eliquis).   3. Chronic diastolic CHF No signs or symptoms of fluid overload today.  4. HTN Patient having low readings at home associated with dizziness. Will decrease losartan to 25 mg daily. Encouraged him to take diuretic earlier in the day.    Follow up with Dr Sallyanne Kuster as scheduled.    [facility] [address] [place], [place] [address] [phone] 01/23/2021 3:36 PM

## 2021-01-23 NOTE — Telephone Encounter (Signed)
Patient c/o Palpitations:  High priority if patient c/o lightheadedness, shortness of breath, or chest pain  How long have you had palpitations/irregular HR/ Afib? Are you having the symptoms now?  Patient's daughter states the patient went into afib early this morning and he is still in afib  Are you currently experiencing lightheadedness, SOB or CP?  No   Do you have a history of afib (atrial fibrillation) or irregular heart rhythm?  Yes   Have you checked your BP or HR? (document readings if available):  BP: 94/73  HR: 107  Are you experiencing any other symptoms?  Weakness

## 2021-01-24 ENCOUNTER — Other Ambulatory Visit: Payer: Self-pay | Admitting: Cardiovascular Disease

## 2021-02-09 ENCOUNTER — Emergency Department (HOSPITAL_COMMUNITY): Payer: Medicare HMO

## 2021-02-09 ENCOUNTER — Emergency Department (HOSPITAL_COMMUNITY)
Admission: EM | Admit: 2021-02-09 | Discharge: 2021-02-10 | Disposition: A | Discharge status: Home / Self Care | Payer: Medicare HMO | Attending: Emergency Medicine | Admitting: Emergency Medicine

## 2021-02-09 DIAGNOSIS — R58 Hemorrhage, not elsewhere classified: Secondary | ICD-10-CM | POA: Diagnosis not present

## 2021-02-09 DIAGNOSIS — I129 Hypertensive chronic kidney disease with stage 1 through stage 4 chronic kidney disease, or unspecified chronic kidney disease: Secondary | ICD-10-CM | POA: Insufficient documentation

## 2021-02-09 DIAGNOSIS — Z79899 Other long term (current) drug therapy: Secondary | ICD-10-CM | POA: Insufficient documentation

## 2021-02-09 DIAGNOSIS — S0181XA Laceration without foreign body of other part of head, initial encounter: Secondary | ICD-10-CM | POA: Diagnosis not present

## 2021-02-09 DIAGNOSIS — S0003XA Contusion of scalp, initial encounter: Secondary | ICD-10-CM | POA: Diagnosis not present

## 2021-02-09 DIAGNOSIS — S81012A Laceration without foreign body, left knee, initial encounter: Secondary | ICD-10-CM | POA: Insufficient documentation

## 2021-02-09 DIAGNOSIS — M1712 Unilateral primary osteoarthritis, left knee: Secondary | ICD-10-CM | POA: Diagnosis not present

## 2021-02-09 DIAGNOSIS — W19XXXA Unspecified fall, initial encounter: Secondary | ICD-10-CM | POA: Diagnosis not present

## 2021-02-09 DIAGNOSIS — Y92009 Unspecified place in unspecified non-institutional (private) residence as the place of occurrence of the external cause: Secondary | ICD-10-CM | POA: Insufficient documentation

## 2021-02-09 DIAGNOSIS — Z7951 Long term (current) use of inhaled steroids: Secondary | ICD-10-CM | POA: Diagnosis not present

## 2021-02-09 DIAGNOSIS — E039 Hypothyroidism, unspecified: Secondary | ICD-10-CM | POA: Insufficient documentation

## 2021-02-09 DIAGNOSIS — Z043 Encounter for examination and observation following other accident: Secondary | ICD-10-CM | POA: Diagnosis not present

## 2021-02-09 DIAGNOSIS — Z87891 Personal history of nicotine dependence: Secondary | ICD-10-CM | POA: Diagnosis not present

## 2021-02-09 DIAGNOSIS — S0121XA Laceration without foreign body of nose, initial encounter: Secondary | ICD-10-CM | POA: Insufficient documentation

## 2021-02-09 DIAGNOSIS — I10 Essential (primary) hypertension: Secondary | ICD-10-CM | POA: Diagnosis not present

## 2021-02-09 DIAGNOSIS — S81011A Laceration without foreign body, right knee, initial encounter: Secondary | ICD-10-CM | POA: Insufficient documentation

## 2021-02-09 DIAGNOSIS — S0101XA Laceration without foreign body of scalp, initial encounter: Secondary | ICD-10-CM | POA: Diagnosis not present

## 2021-02-09 DIAGNOSIS — I4819 Other persistent atrial fibrillation: Secondary | ICD-10-CM | POA: Diagnosis not present

## 2021-02-09 DIAGNOSIS — W01198A Fall on same level from slipping, tripping and stumbling with subsequent striking against other object, initial encounter: Secondary | ICD-10-CM | POA: Diagnosis not present

## 2021-02-09 DIAGNOSIS — M25561 Pain in right knee: Secondary | ICD-10-CM | POA: Diagnosis not present

## 2021-02-09 DIAGNOSIS — Z7901 Long term (current) use of anticoagulants: Secondary | ICD-10-CM | POA: Diagnosis not present

## 2021-02-09 DIAGNOSIS — J449 Chronic obstructive pulmonary disease, unspecified: Secondary | ICD-10-CM | POA: Diagnosis not present

## 2021-02-09 DIAGNOSIS — N183 Chronic kidney disease, stage 3 unspecified: Secondary | ICD-10-CM | POA: Diagnosis not present

## 2021-02-09 DIAGNOSIS — S0990XA Unspecified injury of head, initial encounter: Secondary | ICD-10-CM | POA: Diagnosis not present

## 2021-02-09 DIAGNOSIS — I4891 Unspecified atrial fibrillation: Secondary | ICD-10-CM | POA: Diagnosis not present

## 2021-02-09 LAB — CBC WITH DIFFERENTIAL/PLATELET
Abs Immature Granulocytes: 0.02 10*3/uL (ref 0.00–0.07)
Basophils Absolute: 0.1 10*3/uL (ref 0.0–0.1)
Basophils Relative: 1 %
Eosinophils Absolute: 0.2 10*3/uL (ref 0.0–0.5)
Eosinophils Relative: 3 %
HCT: 39.5 % (ref 39.0–52.0)
Hemoglobin: 13 g/dL (ref 13.0–17.0)
Immature Granulocytes: 0 %
Lymphocytes Relative: 25 %
Lymphs Abs: 1.8 10*3/uL (ref 0.7–4.0)
MCH: 30.3 pg (ref 26.0–34.0)
MCHC: 32.9 g/dL (ref 30.0–36.0)
MCV: 92.1 fL (ref 80.0–100.0)
Monocytes Absolute: 0.8 10*3/uL (ref 0.1–1.0)
Monocytes Relative: 11 %
Neutro Abs: 4.4 10*3/uL (ref 1.7–7.7)
Neutrophils Relative %: 60 %
Platelets: 278 10*3/uL (ref 150–400)
RBC: 4.29 MIL/uL (ref 4.22–5.81)
RDW: 13 % (ref 11.5–15.5)
WBC: 7.3 10*3/uL (ref 4.0–10.5)
nRBC: 0 % (ref 0.0–0.2)

## 2021-02-09 LAB — BASIC METABOLIC PANEL
Anion gap: 9 (ref 5–15)
BUN: 25 mg/dL — ABNORMAL HIGH (ref 8–23)
CO2: 27 mmol/L (ref 22–32)
Calcium: 9.1 mg/dL (ref 8.9–10.3)
Chloride: 100 mmol/L (ref 98–111)
Creatinine, Ser: 1.58 mg/dL — ABNORMAL HIGH (ref 0.61–1.24)
GFR, Estimated: 41 mL/min — ABNORMAL LOW (ref >60)
Glucose, Bld: 115 mg/dL — ABNORMAL HIGH (ref 70–99)
Potassium: 4.8 mmol/L (ref 3.5–5.1)
Sodium: 136 mmol/L (ref 135–145)

## 2021-02-09 MED ORDER — LIDOCAINE-EPINEPHRINE (PF) 2 %-1:200000 IJ SOLN
20.0000 mL | Freq: Once | INTRAMUSCULAR | Status: AC
  Administered 2021-02-09: 20 mL
  Filled 2021-02-09: qty 20

## 2021-02-09 NOTE — Progress Notes (Addendum)
Chaplain responded to fall on thinners. Pt is not available.  Chaplain is inquiring about family.  Daughter, Jeanella Cara, arrived. Chaplain escorted her to room, provided emotional support and orientation.  Chaplain also greeted pt.    Please contact as needed for further support.  [place], [place] Pager:  [phone]    02/09/21 2200  Clinical Encounter Type  Visited With Patient not available  Visit Type Initial;Trauma  Stress Factors  Patient Stress Factors Health changes

## 2021-02-09 NOTE — ED Notes (Signed)
Trauma Response Nurse Note-  Reason for Call / Reason for Trauma activation:   - Level 2 trauma, fall on blood thinner.   Plan of Care as of this note:  - Waiting on imaging results.   Event Summary:   - Pt came in as a level 2 trauma, fall on eliquis. Pt is a very pleasant man who stated he was trying to get into his sleep attire when he fell over, hitting his head on the wall. Pt denies having loss of consciousness. Pt states he is hoping to go home. CT scans completed and family at bedside.

## 2021-02-09 NOTE — Progress Notes (Signed)
Orthopedic Tech Progress Note Patient Details:  Cowen Pesqueira September 15, 1927 [id number]  Patient ID: Casper Harrison, male   DOB: [birth year], 87 y.o.   MRN: [id number] I attended trauma page Karolee Stamps 02/09/2021, 11:04 PM

## 2021-02-09 NOTE — ED Provider Notes (Signed)
[facility] EMERGENCY DEPARTMENT Provider Note   CSN: [id number] Arrival date & time: 02/09/21  2157     History Chief Complaint  Patient presents with   Samuel Mcconnell is a 87 y.o. male.  Patient presents to the emergency department with a chief complaint of fall and head injury.  He states that he was at home changing into his pajamas, and slipped on some wood flooring.  States that he hit his head on the ground.  He did not lose consciousness.  He sustained multiple lacerations to his head, 1 to the left scalp, forehead, and nose.  He is anticoagulated on Eliquis.  He denies numbness, weakness, or tingling of his extremities.  He also sustained abrasions to his bilateral anterior knees.  Denies any treatment prior to arrival.  The history is provided by the patient. No language interpreter was used.      Past Medical History:  Diagnosis Date   Atrial fibrillation ([place])    radiofrequency ablation   Emphysema    Hypertension    Thyroid disease     Patient Active Problem List   Diagnosis Date Noted   Community acquired pneumonia 01/02/2021   Restless legs 11/18/2020   Persistent atrial fibrillation ([place]) 10/14/2020   Secondary hypercoagulable state ([place]) 10/14/2020   Well adult exam 05/07/2020   Laceration of right forearm 03/13/2020   Cellulitis 03/06/2020   Gait instability 11/26/2019   Hypothyroid 11/26/2019   Atypical atrial flutter ([place])    Fall from slip, trip, or stumble, initial encounter 09/08/2019   Bilateral lower extremity edema 09/08/2019   CSF leak from ear 05/16/2019   Chronic obstructive pulmonary disease ([place]) 09/14/2017   Essential hypertension 09/14/2017   Myringotomy tube status 02/25/2017   LVH (left ventricular hypertrophy) 03/28/2016   Gallbladder calculus without cholecystitis 07/22/2014   Fatty liver 07/22/2014   PAF (paroxysmal atrial fibrillation) ([place]) 03/15/2013   CKD (chronic kidney disease) stage 3, GFR  30-59 ml/min (HCC) 02/21/2013   Allergic rhinitis 06/24/2012    Past Surgical History:  Procedure Laterality Date   CARDIAC SURGERY     CARDIOVERSION N/A 10/24/2019   Procedure: CARDIOVERSION;  Surgeon: Sanda Klein, MD;  Location: [place];  Service: Cardiovascular;  Laterality: N/A;   CARDIOVERSION N/A 10/22/2020   Procedure: CARDIOVERSION;  Surgeon: Thayer Headings, MD;  Location: [place];  Service: Cardiovascular;  Laterality: N/A;   COLON SURGERY  1999   KNEE ARTHROSCOPY Left 07/2000   LOOP RECORDER IMPLANT  01/16/2010   [employer]tronic Reveal XT (Dr. Norlene Duel)    RADIOFREQUENCY ABLATION  06/13/2009   SHOULDER ARTHROSCOPY WITH ROTATOR CUFF REPAIR Left 04/1996   TIBIAL TUBERCLERPLASTY  ~ 10/2010   TRANSTHORACIC ECHOCARDIOGRAM  11/19/2011   EF=>55%; mild MR, mild mitral annular calcif; mild TR, normal RSVP; AV mildly sclerotic, mild AV regurg; trace pulm valve regurg        Family History  Problem Relation Age of Onset   Heart failure Father        MI - died @ 10   Heart failure Mother    Diabetes Son    Pancreatitis Son        also ETOH abuse    Cancer Maternal Grandmother    Heart disease Maternal Grandfather    Atrial fibrillation Brother        dx'ed age 60   Atrial fibrillation Brother        dx'ed age 21   Heart failure Brother  Atrial fibrillation Sister    Hypertension Sister    Heart Problems Sister    Diabetes Sister    Heart failure Sister     Social History   Tobacco Use   Smoking status: Former    Years: 46.00    Types: Cigarettes    Quit date: 03/14/2002    Years since quitting: 18.9   Smokeless tobacco: Never   Tobacco comments:    Former smoker 01/23/2021  Vaping Use   Vaping Use: Never used  Substance Use Topics   Alcohol use: No   Drug use: No    Home Medications Prior to Admission medications   Medication Sig Start Date End Date Taking? Authorizing Provider  acetaminophen (TYLENOL) 500 MG tablet Take 500 mg by mouth daily as  needed for moderate pain.    [provider]  amiodarone (PACERONE) 200 MG tablet Take 1 tablet (200 mg total) by mouth daily. 03/25/20 03/20/21  Croitoru, Mihai, MD  amoxicillin-clavulanate (AUGMENTIN) 875-125 MG tablet Take 1 tablet by mouth 2 (two) times daily. 01/02/21   Luetta Nutting, DO  Boswellia-Glucosamine-Vit D (OSTEO BI-FLEX ONE PER DAY) TABS Take 1 tablet by mouth daily.    [provider]  Cholecalciferol (DIALYVITE VITAMIN D 5000) 125 MCG (5000 UT) capsule Take 5,000 Units by mouth daily with lunch.    [provider]  diphenhydramine-acetaminophen (TYLENOL PM) 25-500 MG TABS tablet Take 1 tablet by mouth at bedtime.    [provider]  ELIQUIS 2.5 MG TABS tablet TAKE 1 TABLET TWICE DAILY (DOSE CHANGE) 01/06/21   Croitoru, Mihai, MD  fexofenadine (ALLEGRA) 180 MG tablet Take 180 mg by mouth at bedtime.    [provider]  fluticasone (FLONASE) 50 MCG/ACT nasal spray Place 2 sprays into both nostrils daily. 11/18/20 02/16/21  Luetta Nutting, DO  fluticasone-salmeterol (ADVAIR) 250-50 MCG/ACT AEPB INHALE 1 PUFF TWICE DAILY INTO THE LUNGS (NEED MD APPOINTMENT FOR REFILLS) 01/24/21   Croitoru, Dani Gobble, MD  furosemide (LASIX) 20 MG tablet TAKE 1 TABLET EVERY DAY AS NEEDED FOR  EDEMA 10/17/20   Luetta Nutting, DO  Ketotifen Fumarate (ITCHY EYE DROPS OP) Place 1 drop into both eyes daily as needed (itchy eyes).    [provider]  levothyroxine (SYNTHROID) 137 MCG tablet TAKE 1 TABLET (137 MCG TOTAL) BY MOUTH DAILY BEFORE BREAKFAST. 10/02/20   Luetta Nutting, DO  losartan (COZAAR) 50 MG tablet Take 0.5 tablets (25 mg total) by mouth at bedtime. 01/23/21   Fenton, Clint R, PA  Multiple Vitamin (MULTIVITAMIN) capsule Take 1 capsule by mouth daily.    [provider]  Naphazoline HCl (CLEAR EYES OP) Place 1 drop into both eyes daily as needed (redness).    [provider]  rOPINIRole (REQUIP) 0.25 MG tablet Take 1 tablet (0.25 mg  total) by mouth at bedtime. Take 2 hours before bed 11/21/20   Luetta Nutting, DO  vitamin C (ASCORBIC ACID) 500 MG tablet Take 500 mg by mouth daily with lunch.    [provider]  zinc gluconate 50 MG tablet Take 50 mg by mouth daily with lunch.    [provider]    Allergies    Clindamycin/lincomycin, Dabigatran etexilate mesylate, and Doxycycline  Review of Systems   Review of Systems  All other systems reviewed and are negative.  Physical Exam Updated Vital Signs BP (!) 151/80   Pulse 62   Temp 98.4 F (36.9 C)   Resp 12   SpO2 96%   Physical Exam  Vitals and nursing note reviewed.  Constitutional:      General: He is not in acute distress.    Appearance: He is well-developed.  HENT:     Head: Normocephalic and atraumatic.     Comments: 5 cm semi circular laceration to left parietal scalp 2 cm linear laceration to center of forehead 1.5 cm vertical laceration along the bridge of the nose Eyes:     Conjunctiva/sclera: Conjunctivae normal.  Cardiovascular:     Rate and Rhythm: Normal rate and regular rhythm.     Heart sounds: No murmur heard. Pulmonary:     Effort: Pulmonary effort is normal. No respiratory distress.     Breath sounds: Normal breath sounds.  Abdominal:     Palpations: Abdomen is soft.     Tenderness: There is no abdominal tenderness.  Musculoskeletal:        General: No swelling. Normal range of motion.     Cervical back: Neck supple.  Skin:    General: Skin is warm and dry.     Capillary Refill: Capillary refill takes less than 2 seconds.     Comments: Skin tears to bilateral knees  Neurological:     Mental Status: He is alert and oriented to person, place, and time.     Comments: CN III-XII intact, speech is clear, movements are goal oriented, baseline strength in extremities  Psychiatric:        Mood and Affect: Mood normal.    ED Results / Procedures / Treatments   Labs (all labs ordered are listed, but only abnormal  results are displayed) Labs Reviewed  BASIC METABOLIC PANEL - Abnormal; Notable for the following components:      Result Value   Glucose, Bld 115 (*)    BUN 25 (*)    Creatinine, Ser 1.58 (*)    GFR, Estimated 41 (*)    All other components within normal limits  CBC WITH DIFFERENTIAL/PLATELET  CBG MONITORING, ED    EKG EKG Interpretation  Date/Time:  Sunday February 09 2021 22:11:02 EST Ventricular Rate:  66 PR Interval:  79 QRS Duration: 181 QT Interval:  471 QTC Calculation: 101 R Axis:   -1 Text Interpretation: Sinus rhythm Short PR interval Right bundle branch block No significant change since last tracing Confirmed by Dorie Rank [phone]) on 02/09/2021 10:35:24 PM  Radiology CT HEAD WO CONTRAST (5MM)  Result Date: 02/09/2021 CLINICAL DATA:  Fall, head injury, chronic anticoagulation. Head trauma, coagulopathy (Age 24-64y); Head trauma, minor (Age >= 65y) EXAM: CT HEAD WITHOUT CONTRAST CT CERVICAL SPINE WITHOUT CONTRAST TECHNIQUE: Multidetector CT imaging of the head and cervical spine was performed following the standard protocol without intravenous contrast. Multiplanar CT image reconstructions of the cervical spine were also generated. COMPARISON:  02/29/2020 FINDINGS: CT HEAD FINDINGS Brain: Normal anatomic configuration. Parenchymal volume loss is commensurate with the patient's age. Mild periventricular white matter changes are present likely reflecting the sequela of small vessel ischemia. No abnormal intra or extra-axial mass lesion or fluid collection. No abnormal mass effect or midline shift. No evidence of acute intracranial hemorrhage or infarct. Ventricular size is normal. Cerebellum unremarkable. Vascular: No asymmetric hyperdense vasculature at the skull base. Skull: Intact Sinuses/Orbits: Paranasal sinuses are clear. Ocular lenses have been removed. Orbits are otherwise unremarkable. Other: Mastoid air cells and middle ear cavities are clear. There is soft tissue  swelling and subcutaneous gas along the nasal bridge as well as a small frontal and left parietal scalp hematoma. CT CERVICAL SPINE FINDINGS  Alignment: Normal alignment.  No listhesis. Skull base and vertebrae: Craniocervical alignment is normal. The atlantodental interval is not widened. No acute fracture of the cervical spine. Vertebral body height has been preserved. Soft tissues and spinal canal: No prevertebral fluid or swelling. No visible canal hematoma. Disc levels: There is intervertebral disc space narrowing and endplate remodeling J1-H4 in keeping with changes of severe degenerative disc disease. The prevertebral soft tissues are not thickened on sagittal reformats. Review of the axial images demonstrates multilevel advanced uncovertebral arthrosis without significant associated neuroforaminal narrowing or canal stenosis. Upper chest: Negative. Other: None IMPRESSION: No acute intracranial abnormality. No calvarial fracture. Soft tissue swelling superficial to the nasal bridge and multiple small scalp hematoma as noted above. No acute fracture or listhesis of the cervical spine. Electronically Signed   By: Fidela Salisbury M.D.   On: 02/09/2021 22:55   CT Cervical Spine Wo Contrast  Result Date: 02/09/2021 CLINICAL DATA:  Fall, head injury, chronic anticoagulation. Head trauma, coagulopathy (Age 66-64y); Head trauma, minor (Age >= 65y) EXAM: CT HEAD WITHOUT CONTRAST CT CERVICAL SPINE WITHOUT CONTRAST TECHNIQUE: Multidetector CT imaging of the head and cervical spine was performed following the standard protocol without intravenous contrast. Multiplanar CT image reconstructions of the cervical spine were also generated. COMPARISON:  02/29/2020 FINDINGS: CT HEAD FINDINGS Brain: Normal anatomic configuration. Parenchymal volume loss is commensurate with the patient's age. Mild periventricular white matter changes are present likely reflecting the sequela of small vessel ischemia. No abnormal intra or  extra-axial mass lesion or fluid collection. No abnormal mass effect or midline shift. No evidence of acute intracranial hemorrhage or infarct. Ventricular size is normal. Cerebellum unremarkable. Vascular: No asymmetric hyperdense vasculature at the skull base. Skull: Intact Sinuses/Orbits: Paranasal sinuses are clear. Ocular lenses have been removed. Orbits are otherwise unremarkable. Other: Mastoid air cells and middle ear cavities are clear. There is soft tissue swelling and subcutaneous gas along the nasal bridge as well as a small frontal and left parietal scalp hematoma. CT CERVICAL SPINE FINDINGS Alignment: Normal alignment.  No listhesis. Skull base and vertebrae: Craniocervical alignment is normal. The atlantodental interval is not widened. No acute fracture of the cervical spine. Vertebral body height has been preserved. Soft tissues and spinal canal: No prevertebral fluid or swelling. No visible canal hematoma. Disc levels: There is intervertebral disc space narrowing and endplate remodeling R7-E0 in keeping with changes of severe degenerative disc disease. The prevertebral soft tissues are not thickened on sagittal reformats. Review of the axial images demonstrates multilevel advanced uncovertebral arthrosis without significant associated neuroforaminal narrowing or canal stenosis. Upper chest: Negative. Other: None IMPRESSION: No acute intracranial abnormality. No calvarial fracture. Soft tissue swelling superficial to the nasal bridge and multiple small scalp hematoma as noted above. No acute fracture or listhesis of the cervical spine. Electronically Signed   By: Fidela Salisbury M.D.   On: 02/09/2021 22:55   DG Knee Complete 4 Views Left  Result Date: 02/09/2021 CLINICAL DATA:  Recent fall with knee pain, initial encounter EXAM: LEFT KNEE - COMPLETE 4+ VIEW COMPARISON:  10/23/2017 FINDINGS: Tricompartmental degenerative changes are noted. No joint effusion is seen. No acute fracture or  dislocation is noted. No soft tissue changes are seen. IMPRESSION: Degenerative change without acute abnormality Electronically Signed   By: Inez Catalina M.D.   On: 02/09/2021 22:34   DG Knee Complete 4 Views Right  Result Date: 02/09/2021 CLINICAL DATA:  Recent fall with right knee pain, initial encounter EXAM: RIGHT KNEE - COMPLETE  4+ VIEW COMPARISON:  03/01/2019 FINDINGS: Degenerative changes are noted most prominent in the medial joint space. No joint effusion is seen. No acute fracture or dislocation is noted. IMPRESSION: Degenerative change without acute abnormality. Electronically Signed   By: Inez Catalina M.D.   On: 02/09/2021 22:35    Procedures .Marland KitchenLaceration Repair  Date/Time: 02/10/2021 12:18 AM Performed by: Montine Circle, PA-C Authorized by: Montine Circle, PA-C   Consent:    Consent obtained:  Verbal   Consent given by:  Patient   Risks discussed:  Infection, need for additional repair, pain, poor cosmetic result and poor wound healing   Alternatives discussed:  No treatment and delayed treatment Universal protocol:    Procedure explained and questions answered to patient or proxy's satisfaction: yes     Relevant documents present and verified: yes     Test results available: yes     Imaging studies available: yes     Required blood products, implants, devices, and special equipment available: yes     Site/side marked: yes     Immediately prior to procedure, a time out was called: yes     Patient identity confirmed:  Verbally with patient Anesthesia:    Anesthesia method:  Local infiltration   Local anesthetic:  Lidocaine 1% WITH epi Laceration details:    Location:  Scalp   Scalp location:  L parietal   Length (cm):  3 Pre-procedure details:    Preparation:  Patient was prepped and draped in usual sterile fashion Exploration:    Contaminated: no   Treatment:    Area cleansed with:  Saline   Irrigation method:  Syringe Skin repair:    Repair method:   Staples   Number of staples:  5 Approximation:    Approximation:  Close .Marland KitchenLaceration Repair  Date/Time: 02/10/2021 12:19 AM Performed by: Montine Circle, PA-C Authorized by: Montine Circle, PA-C   Consent:    Consent obtained:  Verbal   Consent given by:  Patient   Risks discussed:  Infection, need for additional repair, pain, poor cosmetic result and poor wound healing   Alternatives discussed:  No treatment and delayed treatment Universal protocol:    Procedure explained and questions answered to patient or proxy's satisfaction: yes     Relevant documents present and verified: yes     Test results available: yes     Imaging studies available: yes     Required blood products, implants, devices, and special equipment available: yes     Site/side marked: yes     Immediately prior to procedure, a time out was called: yes     Patient identity confirmed:  Verbally with patient Anesthesia:    Anesthesia method:  Local infiltration   Local anesthetic:  Lidocaine 1% WITH epi Laceration details:    Location:  Face   Face location:  Nose   Length (cm):  1.5 Pre-procedure details:    Preparation:  Patient was prepped and draped in usual sterile fashion Treatment:    Area cleansed with:  Saline   Irrigation method:  Syringe Skin repair:    Repair method:  Sutures   Suture size:  4-0   Wound skin closure material used: vicryl.   Suture technique:  Simple interrupted and figure eight   Number of sutures:  6 Approximation:    Approximation:  Close Repair type:    Repair type:  Simple Post-procedure details:    Dressing:  Adhesive bandage and antibiotic ointment .Marland KitchenLaceration Repair  Date/Time: 02/10/2021 12:20 AM Performed  by: Montine Circle, PA-C Authorized by: Montine Circle, PA-C   Consent:    Consent obtained:  Verbal   Consent given by:  Patient   Risks discussed:  Infection, need for additional repair, pain, poor cosmetic result and poor wound healing    Alternatives discussed:  No treatment and delayed treatment Universal protocol:    Procedure explained and questions answered to patient or proxy's satisfaction: yes     Relevant documents present and verified: yes     Test results available: yes     Imaging studies available: yes     Required blood products, implants, devices, and special equipment available: yes     Site/side marked: yes     Immediately prior to procedure, a time out was called: yes     Patient identity confirmed:  Verbally with patient Anesthesia:    Anesthesia method:  Local infiltration   Local anesthetic:  Lidocaine 1% WITH epi Laceration details:    Location:  Face   Face location:  Forehead   Length (cm):  2 Treatment:    Area cleansed with:  Saline   Irrigation method:  Syringe Skin repair:    Repair method:  Sutures   Suture size:  4-0   Wound skin closure material used: vicryl.   Suture technique:  Simple interrupted   Number of sutures:  5 Approximation:    Approximation:  Close Repair type:    Repair type:  Simple Post-procedure details:    Dressing:  Non-adherent dressing and antibiotic ointment   Procedure completion:  Tolerated well, no immediate complications   Medications Ordered in ED Medications  lidocaine-EPINEPHrine (XYLOCAINE W/EPI) 2 %-1:200000 (PF) injection 20 mL (has no administration in time range)    ED Course  I have reviewed the triage vital signs and the nursing notes.  Pertinent labs & imaging results that were available during my care of the patient were reviewed by me and considered in my medical decision making (see chart for details).    MDM Rules/Calculators/A&P                          Patient here after a slip and fall at home.  He fell striking his head on the ground after slipping in his socks on hardwood floors.  He was brought to the emergency department as a level 2 trauma.  He is anticoagulated on Eliquis.  CT scan showed no intracranial bleed or skull  fracture.  He did sustain a laceration to his left parietal scalp, forehead, nose.  These were repaired by me as above.  He also had bilateral skin tears to his knees, which were cleaned and dressed.  We discussed the potential for delayed bleeding and went over return precautions.  Patient and daughter understand and agree with the plan.  He is at his normal neurologic baseline.  He is stable for discharge.  Final Clinical Impression(s) / ED Diagnoses Final diagnoses:  Fall, initial encounter  Laceration of scalp, initial encounter  Facial laceration, initial encounter    Rx / DC Orders ED Discharge Orders     None        Montine Circle, PA-C 02/10/21 Alvira Philips, MD 02/11/21 [phone]

## 2021-02-09 NOTE — ED Notes (Signed)
Called to bring patient to CT. Told both rooms full with level 1s.

## 2021-02-09 NOTE — ED Triage Notes (Signed)
EMS arrival. Fall from standing. Head strike effecting forehead and nose noted. Abrasions to knees bilaterally. Negative LOC. Witnessed by wife. Doesn't complain of pain. Takes Eliquis.

## 2021-02-17 ENCOUNTER — Ambulatory Visit: Payer: Medicare HMO | Admitting: Cardiovascular Disease

## 2021-02-17 ENCOUNTER — Encounter: Payer: Self-pay | Admitting: Cardiovascular Disease

## 2021-02-17 VITALS — BP 130/66 | HR 60 | Ht 71.0 in | Wt 190.8 lb

## 2021-02-17 DIAGNOSIS — I5032 Chronic diastolic (congestive) heart failure: Secondary | ICD-10-CM | POA: Diagnosis not present

## 2021-02-17 DIAGNOSIS — I495 Sick sinus syndrome: Secondary | ICD-10-CM | POA: Diagnosis not present

## 2021-02-17 DIAGNOSIS — I480 Paroxysmal atrial fibrillation: Secondary | ICD-10-CM | POA: Diagnosis not present

## 2021-02-17 DIAGNOSIS — J449 Chronic obstructive pulmonary disease, unspecified: Secondary | ICD-10-CM

## 2021-02-17 DIAGNOSIS — N1831 Chronic kidney disease, stage 3a: Secondary | ICD-10-CM

## 2021-02-17 DIAGNOSIS — I10 Essential (primary) hypertension: Secondary | ICD-10-CM | POA: Diagnosis not present

## 2021-02-17 NOTE — Progress Notes (Addendum)
Cardiology Office Note    Date:  02/17/2021   ID:  Samuel Mcconnell, DOB 1928/02/17, MRN [id number]  PCP:  Luetta Nutting, DO  Cardiologist:   Sanda Klein, MD   Chief Complaint  Patient presents with   Atrial Flutter     History of Present Illness:  Samuel Mcconnell is a 87 y.o. male with history of radiofrequency ablation for atrial fibrillation roughly 10 years ago, recently recurrent as atrial fibrillation for which he underwent elective cardioversion successfully in August 2022.  He has a tendency to have sinus bradycardia as well as first-degree AV block and right bundle branch block.  His dose of losartan was decreased on November 3 when he was seen in the atrial fibrillation clinic when his blood pressure was low.  He had had some palpitations the day before that were associated with a heart rate over 107 (heart rate is usually in the 50-60 range) and his blood pressure is 93/74.  He called EMS but by the time they arrived he had returned to baseline.  He has generally had very infrequent and very brief palpitations and none in the last few weeks.  He continues to feel that he is a little unsteady on his legs and walks cautiously.  On November 20 he actually had a fall while he was trying to pull on his pajama pants.  His socks slipped on the floor and he fell and hit his head.  He has several staples in the parietal area and stitches across the bridge of his nose and forehead.  Head and cervical spine CTs did not show any serious injuries other than scalp hematoma.  He is recovering well from this.  He remains compliant with Eliquis and has not had any other falls or bleeding problems.  He denies shortness of breath at rest or with activity does not have orthopnea, PND or edema.  He does not have chest pain.  On June 17 he fell down the basement stairs while carrying heavy equipment.  On arrival to the hospital [facility]) he was found to be in atrial fibrillation.  Looking back his  family wonders whether the arrhythmia may have started a month or 2 before, when he started complaining of some fatigue.  He was hospitalized and rate control was achieved on a combination of metoprolol and diltiazem.  Eliquis was started.  He had an echocardiogram that showed normal left ventricular systolic function with mild to moderate tricuspid regurgitation and moderate pulmonary hypertension.    His last echocardiogram in in the [facility] system was performed in 2018 and showed normal left ventricular systolic function and no clear evidence of diastolic dysfunction (his tissue Doppler velocities were actually probably normal for almost 87 years old).  The left atrium was "moderately dilated" (systolic diameter was only 36 mm, but indexed volume was 28.5 which I believe represents very mild dilation).  There is no serious valvular problem.  He underwent a event monitor as if there were 2018 that showed PACs and PVCs but no atrial fibrillation.  He has hypertension, COPD, treated hypothyroidism and acid reflux. He has a long-standing right bundle branch block.  Past Medical History:  Diagnosis Date   Atrial fibrillation ([place])    radiofrequency ablation   Emphysema    Hypertension    Thyroid disease     Past Surgical History:  Procedure Laterality Date   CARDIAC SURGERY     CARDIOVERSION N/A 10/24/2019   Procedure: CARDIOVERSION;  Surgeon: Sanda Klein, MD;  Location: [place];  Service: Cardiovascular;  Laterality: N/A;   CARDIOVERSION N/A 10/22/2020   Procedure: CARDIOVERSION;  Surgeon: Thayer Headings, MD;  Location: [place];  Service: Cardiovascular;  Laterality: N/A;   COLON SURGERY  1999   KNEE ARTHROSCOPY Left 07/2000   LOOP RECORDER IMPLANT  01/16/2010   [employer] Reveal XT (Dr. Norlene Duel)    RADIOFREQUENCY ABLATION  06/13/2009   SHOULDER ARTHROSCOPY WITH ROTATOR CUFF REPAIR Left 04/1996   TIBIAL TUBERCLERPLASTY  ~ 10/2010   TRANSTHORACIC ECHOCARDIOGRAM  11/19/2011    EF=>55%; mild MR, mild mitral annular calcif; mild TR, normal RSVP; AV mildly sclerotic, mild AV regurg; trace pulm valve regurg     Current Medications: Outpatient Medications Prior to Visit  Medication Sig Dispense Refill   acetaminophen (TYLENOL) 500 MG tablet Take 500 mg by mouth daily as needed for moderate pain.     amiodarone (PACERONE) 200 MG tablet Take 1 tablet (200 mg total) by mouth daily. 90 tablet 3   Boswellia-Glucosamine-Vit D (OSTEO BI-FLEX ONE PER DAY) TABS Take 1 tablet by mouth daily.     Cholecalciferol (DIALYVITE VITAMIN D 5000) 125 MCG (5000 UT) capsule Take 5,000 Units by mouth daily with lunch.     diphenhydramine-acetaminophen (TYLENOL PM) 25-500 MG TABS tablet Take 1 tablet by mouth at bedtime.     ELIQUIS 2.5 MG TABS tablet TAKE 1 TABLET TWICE DAILY (DOSE CHANGE) 180 tablet 3   fluticasone (FLONASE) 50 MCG/ACT nasal spray Place 2 sprays into both nostrils daily. 16 g 3   fluticasone-salmeterol (ADVAIR) 250-50 MCG/ACT AEPB INHALE 1 PUFF TWICE DAILY INTO THE LUNGS (NEED MD APPOINTMENT FOR REFILLS) 180 each 1   furosemide (LASIX) 20 MG tablet TAKE 1 TABLET EVERY DAY AS NEEDED FOR  EDEMA 90 tablet 1   Ketotifen Fumarate (ITCHY EYE DROPS OP) Place 1 drop into both eyes daily as needed (itchy eyes).     levothyroxine (SYNTHROID) 137 MCG tablet TAKE 1 TABLET (137 MCG TOTAL) BY MOUTH DAILY BEFORE BREAKFAST. 90 tablet 2   losartan (COZAAR) 50 MG tablet Take 0.5 tablets (25 mg total) by mouth at bedtime. 90 tablet 1   Multiple Vitamin (MULTIVITAMIN) capsule Take 1 capsule by mouth daily.     vitamin C (ASCORBIC ACID) 500 MG tablet Take 500 mg by mouth daily with lunch.     zinc gluconate 50 MG tablet Take 50 mg by mouth daily with lunch.     amoxicillin-clavulanate (AUGMENTIN) 875-125 MG tablet Take 1 tablet by mouth 2 (two) times daily. 20 tablet 0   fexofenadine (ALLEGRA) 180 MG tablet Take 180 mg by mouth at bedtime. (Patient not taking: Reported on 02/17/2021)      Naphazoline HCl (CLEAR EYES OP) Place 1 drop into both eyes daily as needed (redness). (Patient not taking: Reported on 02/17/2021)     rOPINIRole (REQUIP) 0.25 MG tablet Take 1 tablet (0.25 mg total) by mouth at bedtime. Take 2 hours before bed (Patient not taking: Reported on 02/17/2021) 30 tablet 1   No facility-administered medications prior to visit.     Allergies:   Clindamycin/lincomycin, Dabigatran etexilate mesylate, and Doxycycline   Social History   Socioeconomic History   Marital status: Married    Spouse name: Barnett Applebaum   Number of children: 2   Years of education: 16   Highest education level: Bachelor's degree (e.g., BA, AB, BS)  Occupational History   Occupation: Retired  Tobacco Use   Smoking status: Former    Years: 46.00  Types: Cigarettes    Quit date: 03/14/2002    Years since quitting: 18.9   Smokeless tobacco: Never   Tobacco comments:    Former smoker 01/23/2021  Vaping Use   Vaping Use: Never used  Substance and Sexual Activity   Alcohol use: No   Drug use: No   Sexual activity: Not Currently    Partners: Female  Other Topics Concern   Not on file  Social History Narrative   Lives at [place] [facility] with his wife. He is still driving. He enjoys golf and gardening (when is he is able to).   Social Determinants of Health   Financial Resource Strain: Low Risk    Difficulty of Paying Living Expenses: Not hard at all  Food Insecurity: No Food Insecurity   Worried About Charity fundraiser in the Last Year: Never true   [place] in the Last Year: Never true  Transportation Needs: No Transportation Needs   Lack of Transportation (Medical): No   Lack of Transportation (Non-Medical): No  Physical Activity: Sufficiently Active   Days of Exercise per Week: 4 days   Minutes of Exercise per Session: 50 min  Stress: No Stress Concern Present   Feeling of Stress : Not at all  Social Connections: Socially Integrated   Frequency of Communication  with Friends and Family: More than three times a week   Frequency of Social Gatherings with Friends and Family: More than three times a week   Attends Religious Services: More than 4 times per year   Active Member of [employer] or Organizations: Yes   Attends Archivist Meetings: Never   Marital Status: Married     Family History:  The patient's family history includes Atrial fibrillation in his brother, brother, and sister; Cancer in his maternal grandmother; Diabetes in his sister and son; Heart Problems in his sister; Heart disease in his maternal grandfather; Heart failure in his brother, father, mother, and sister; Hypertension in his sister; Pancreatitis in his son.   ROS:   Please see the history of present illness.    ROS All other systems are reviewed and are negative.   PHYSICAL EXAM:   VS:  BP 130/66 (BP Location: Left Arm, Patient Position: Sitting, Cuff Size: Normal)   Pulse 60   Ht 5\' 11"  (1.803 m)   Wt 190 lb 12.8 oz (86.5 kg)   SpO2 98%   BMI 26.61 kg/m       General: Alert, oriented x3, no distress, appears younger than stated age. Head: Staples in area of left parietal scalp hematoma, stitches across bridge of nose and mid forehead, PERRL, EOMI, no exophtalmos or lid lag, no myxedema, no xanthelasma; normal ears, nose and oropharynx Neck: normal jugular venous pulsations and no hepatojugular reflux; brisk carotid pulses without delay and no carotid bruits Chest: clear to auscultation, no signs of consolidation by percussion or palpation, normal fremitus, symmetrical and full respiratory excursions Cardiovascular: normal position and quality of the apical impulse, regular rhythm, normal first and second heart sounds, no murmurs, rubs or gallops Abdomen: no tenderness or distention, no masses by palpation, no abnormal pulsatility or arterial bruits, normal bowel sounds, no hepatosplenomegaly Extremities: no clubbing, cyanosis or edema; 2+ radial, ulnar and brachial  pulses bilaterally; 2+ right femoral, posterior tibial and dorsalis pedis pulses; 2+ left femoral, posterior tibial and dorsalis pedis pulses; no subclavian or femoral bruits Neurological: grossly nonfocal Psych: Normal mood and affect    Wt Readings from Last  3 Encounters:  02/17/21 190 lb 12.8 oz (86.5 kg)  02/09/21 187 lb 9.8 oz (85.1 kg)  01/23/21 187 lb 9.6 oz (85.1 kg)      Studies/Labs Reviewed:   ECHO [facility]  Result Date: 09/10/2019 Left Ventricle: Normal left ventricle. Wall thickness is normal. Systolic function is normal. EF: 55-60%. Wall motion cannot be accurately assessed. Doppler parameters are indeterminate for diastolic function.  Right Ventricle: Right ventricle is mildly dilated. Abnormal tricuspid annular plane systolic excursion (TAPSE) <1.7 cm.  Right Atrium: Right atrium is mildly dilated.  Tricuspid Valve: Tricuspid valve structure is normal. There is mild to moderate regurgitation. The right ventricular systolic pressure is moderately elevated (50-59 mmHg).   [facility] CT 09/07/2019  IMPRESSION:  1. No rib fracture or pneumothorax identified.  2. Stable atherosclerosis.  3. No definite soft tissue organ injury identified.  4. No significant change hypodense focus left kidney.  5. No free air or fluid identified.    EKG:  EKG is not ordered today.  ECG from 02/09/2021 was personally reviewed and shows sinus rhythm with first-degree AV block and right bundle branch block, similar to previous tracings.  Cholesterol  Recent Labs: 08/28/2019 TSH 3.53 Hemoglobin 13.5  BMET    Component Value Date/Time   NA 136 02/09/2021 2209   NA 141 02/29/2020 0958   K 4.8 02/09/2021 2209   CL 100 02/09/2021 2209   CO2 27 02/09/2021 2209   GLUCOSE 115 (H) 02/09/2021 2209   BUN 25 (H) 02/09/2021 2209   BUN 27 02/29/2020 0958   CREATININE 1.58 (H) 02/09/2021 2209   CREATININE 1.57 (H) 05/07/2020 1130   CALCIUM 9.1 02/09/2021 2209   GFRNONAA 41 (L) 02/09/2021 2209    GFRNONAA 38 (L) 05/07/2020 1130   GFRAA 44 (L) 05/07/2020 1130     Lipid Panel 08/28/2019 The request for 160, triglycerides 79, HDL 41, LDL 104  Lipid Panel  No results found for: CHOL, TRIG, HDL, CHOLHDL, VLDL, LDLCALC, LDLDIRECT, LABVLDL    Additional studies/ records that were reviewed today include:  Notes from hospitalization at [facility]. Spirometry 05/16/2019 showing FEV1 1.77 L (68%), FVC 2.51 L) 70%), mild restrictive ventilatory impairment unchanged  ASSESSMENT:    1. PAF (paroxysmal atrial fibrillation) ([place])   2. Tachycardia-bradycardia syndrome ([place])   3. Chronic diastolic heart failure ([place])   4. Chronic obstructive pulmonary disease, unspecified COPD type ([place])   5. Essential hypertension   6. Stage 3a chronic kidney disease ([facility])      PLAN:  In order of problems listed above:  AFib/atypical atrial flutter: Possibly had a recurrence in early November that was brief and did not require hospitalization.  So far tolerating amiodarone without side effects.  His fall is concerning, but I do not think there is a reason to stop his Eliquis at this point.CHADSVasc at least 4 (age 60, HTN, CHF). Tachybradycardia syndrome: It is possible that his conduction abnormalities will worsen as the amiodarone fully kicks in (been on it for about 3 months now).  Monitor closely for bradycardia arrhythmia. CHF: He has minimal swelling in his right ankle for which she takes furosemide most days of the week with good control.  He should avoid taking excessive diuretics since this could lead to orthostatic hypotension and syncope.   COPD:  on long-acting bronchodilators.  Will be stopping his beta-blocker altogether.   HTN: Blood pressure is in acceptable range.  Keep on the lower dose losartan 25 mg once daily CKD 3a: Baseline GFR seems  to be around 50, GFR 1.2-1.4.  When he was seen in the emergency room with his fall, creatinine was higher at 1.58, suggesting a possible component  of hypovolemia.   Medication Adjustments/Labs and Tests Ordered: Current medicines are reviewed at length with the patient today.  Concerns regarding medicines are outlined above.  Medication changes, Labs and Tests ordered today are listed in the Patient Instructions below. Patient Instructions  Medication Instructions:  No changes *If you need a refill on your cardiac medications before your next appointment, please call your pharmacy*   Lab Work: Your provider would like for you to return in January to have the following labs drawn: CMET and TSH. You do not need an appointment for the lab. Once in our office lobby there is a podium where you can sign in and ring the doorbell to alert [place] that you are here. The lab is open from 8:00 am to 4:30 pm; closed for lunch from 12:45pm-1:45pm.  If you have labs (blood work) drawn today and your tests are completely normal, you will receive your results only by: [place] (if you have MyChart) OR A paper copy in the mail If you have any lab test that is abnormal or we need to change your treatment, we will call you to review the results.   Testing/Procedures: None ordered   Follow-Up: At [facility], you and your health needs are our priority.  As part of our continuing mission to provide you with exceptional heart care, we have created designated Provider Care Teams.  These Care Teams include your primary Cardiologist (physician) and Advanced Practice Providers (APPs -  Physician Assistants and Nurse Practitioners) who all work together to provide you with the care you need, when you need it.  We recommend signing up for the patient portal called "MyChart".  Sign up information is provided on this After Visit Summary.  MyChart is used to connect with patients for Virtual Visits (Telemedicine).  Patients are able to view lab/test results, encounter notes, upcoming appointments, etc.  Non-urgent messages can be sent to your provider as  well.   To learn more about what you can do with MyChart, go to [email or web address].    Your next appointment:   6 month(s)  The format for your next appointment:   In Person  Provider:   Sanda Klein, MD       Signed, Sanda Klein, MD  02/17/2021 9:29 PM    [address], [place], [place]  [address] Phone: [phone]; Fax: [phone]

## 2021-02-17 NOTE — Patient Instructions (Signed)
Medication Instructions:  No changes *If you need a refill on your cardiac medications before your next appointment, please call your pharmacy*   Lab Work: Your provider would like for you to return in January to have the following labs drawn: CMET and TSH. You do not need an appointment for the lab. Once in our office lobby there is a podium where you can sign in and ring the doorbell to alert [place] that you are here. The lab is open from 8:00 am to 4:30 pm; closed for lunch from 12:45pm-1:45pm.  If you have labs (blood work) drawn today and your tests are completely normal, you will receive your results only by: [place] (if you have MyChart) OR A paper copy in the mail If you have any lab test that is abnormal or we need to change your treatment, we will call you to review the results.   Testing/Procedures: None ordered   Follow-Up: At [facility], you and your health needs are our priority.  As part of our continuing mission to provide you with exceptional heart care, we have created designated Provider Care Teams.  These Care Teams include your primary Cardiologist (physician) and Advanced Practice Providers (APPs -  Physician Assistants and Nurse Practitioners) who all work together to provide you with the care you need, when you need it.  We recommend signing up for the patient portal called "MyChart".  Sign up information is provided on this After Visit Summary.  MyChart is used to connect with patients for Virtual Visits (Telemedicine).  Patients are able to view lab/test results, encounter notes, upcoming appointments, etc.  Non-urgent messages can be sent to your provider as well.   To learn more about what you can do with MyChart, go to [email or web address].    Your next appointment:   6 month(s)  The format for your next appointment:   In Person  Provider:   Sanda Klein, MD

## 2021-02-18 ENCOUNTER — Encounter: Payer: Self-pay | Admitting: Family Medicine

## 2021-02-18 ENCOUNTER — Other Ambulatory Visit: Payer: Self-pay

## 2021-02-18 ENCOUNTER — Ambulatory Visit (INDEPENDENT_AMBULATORY_CARE_PROVIDER_SITE_OTHER): Payer: Medicare HMO | Admitting: Family Medicine

## 2021-02-18 DIAGNOSIS — J3489 Other specified disorders of nose and nasal sinuses: Secondary | ICD-10-CM | POA: Diagnosis not present

## 2021-02-18 DIAGNOSIS — S0101XA Laceration without foreign body of scalp, initial encounter: Secondary | ICD-10-CM | POA: Diagnosis not present

## 2021-02-18 MED ORDER — MUPIROCIN 2 % EX OINT: 1.0000 "application " | TOPICAL_OINTMENT | Freq: Two times a day (BID) | CUTANEOUS | 0 refills | Status: AC

## 2021-02-18 NOTE — Assessment & Plan Note (Signed)
Recommend saline spray and humidifier at home.  Adding 1 week course of mupirocin as well.

## 2021-02-18 NOTE — Progress Notes (Signed)
Samuel Mcconnell - 87 y.o. male MRN [id number]  Date of birth: 01/07/1928  Subjective Chief Complaint  Patient presents with   Suture / Staple Removal    HPI Samuel Mcconnell is a 87 year old male here today for follow-up of recent ER visit.  He unfortunately had a fall at home while trying to get dressed.  He was putting on his pants and slipped hitting his head.  Seen in the emergency room for laceration to his scalp, the bridge of his nose and forehead.  He had CT scan showing scalp hematoma but no other abnormalities.  Laceration to forehead and nose was repaired with absorbable suture.  Laceration to scalp was repaired with staples.  He has done well since discharge and denies any headaches or dizziness.  He is here to have staples removed today.  He has noted some crusting and scabbing inside of his nose.  He does use nasal spray occasionally.  He does not have a humidifier in his home.  ROS:  A comprehensive ROS was completed and negative except as noted per HPI  Allergies  Allergen Reactions   Clindamycin/Lincomycin Other (See Comments)    Pt felt weird     Dabigatran Etexilate Mesylate Other (See Comments)    "felt weird"   Doxycycline Other (See Comments)    Dyspepsia    Past Medical History:  Diagnosis Date   Atrial fibrillation ([place])    radiofrequency ablation   Emphysema    Hypertension    Thyroid disease     Past Surgical History:  Procedure Laterality Date   CARDIAC SURGERY     CARDIOVERSION N/A 10/24/2019   Procedure: CARDIOVERSION;  Surgeon: Sanda Klein, MD;  Location: [place];  Service: Cardiovascular;  Laterality: N/A;   CARDIOVERSION N/A 10/22/2020   Procedure: CARDIOVERSION;  Surgeon: Thayer Headings, MD;  Location: [place];  Service: Cardiovascular;  Laterality: N/A;   COLON SURGERY  1999   KNEE ARTHROSCOPY Left 07/2000   LOOP RECORDER IMPLANT  01/16/2010   Medtronic Reveal XT (Dr. Norlene Duel)    RADIOFREQUENCY ABLATION  06/13/2009   SHOULDER  ARTHROSCOPY WITH ROTATOR CUFF REPAIR Left 04/1996   TIBIAL TUBERCLERPLASTY  ~ 10/2010   TRANSTHORACIC ECHOCARDIOGRAM  11/19/2011   EF=>55%; mild MR, mild mitral annular calcif; mild TR, normal RSVP; AV mildly sclerotic, mild AV regurg; trace pulm valve regurg     Social History   Socioeconomic History   Marital status: Married    Spouse name: Barnett Applebaum   Number of children: 2   Years of education: 16   Highest education level: Bachelor's degree (e.g., BA, AB, BS)  Occupational History   Occupation: Retired  Tobacco Use   Smoking status: Former    Years: 46.00    Types: Cigarettes    Quit date: 03/14/2002    Years since quitting: 18.9   Smokeless tobacco: Never   Tobacco comments:    Former smoker 01/23/2021  Vaping Use   Vaping Use: Never used  Substance and Sexual Activity   Alcohol use: No   Drug use: No   Sexual activity: Not Currently    Partners: Female  Other Topics Concern   Not on file  Social History Narrative   Lives at [employer] [facility] with his wife. He is still driving. He enjoys golf and gardening (when is he is able to).   Social Determinants of Health   Financial Resource Strain: Low Risk    Difficulty of Paying Living Expenses: Not hard at all  Food  Insecurity: No Food Insecurity   Worried About Charity fundraiser in the Last Year: Never true   Ran Out of Food in the Last Year: Never true  Transportation Needs: No Transportation Needs   Lack of Transportation (Medical): No   Lack of Transportation (Non-Medical): No  Physical Activity: Sufficiently Active   Days of Exercise per Week: 4 days   Minutes of Exercise per Session: 50 min  Stress: No Stress Concern Present   Feeling of Stress : Not at all  Social Connections: Socially Integrated   Frequency of Communication with Friends and Family: More than three times a week   Frequency of Social Gatherings with Friends and Family: More than three times a week   Attends Religious Services: More than 4 times  per year   Active Member of [employer] or Organizations: Yes   Attends Archivist Meetings: Never   Marital Status: Married    Family History  Problem Relation Age of Onset   Heart failure Father        MI - died @ 76   Heart failure Mother    Diabetes Son    Pancreatitis Son        also ETOH abuse    Cancer Maternal Grandmother    Heart disease Maternal Grandfather    Atrial fibrillation Brother        dx'ed age 41   Atrial fibrillation Brother        dx'ed age 14   Heart failure Brother    Atrial fibrillation Sister    Hypertension Sister    Heart Problems Sister    Diabetes Sister    Heart failure Sister     Health Maintenance  Topic Date Due   COVID-19 Vaccine (5 - Booster for Pfizer series) 09/05/2020   TETANUS/TDAP  09/06/2029   Pneumonia Vaccine 58+ Years old  Completed   INFLUENZA VACCINE  Completed   Zoster Vaccines- Shingrix  Completed   HPV VACCINES  Aged Out     ----------------------------------------------------------------------------------------------------------------------------------------------------------------------------------------------------------------- Physical Exam BP 140/65 (BP Location: Left Arm, Patient Position: Sitting, Cuff Size: Large)   Pulse 77   Ht 5\' 11"  (1.803 m)   Wt 192 lb (87.1 kg)   SpO2 98%   BMI 26.78 kg/m   Physical Exam Constitutional:      Appearance: Normal appearance.  Cardiovascular:     Rate and Rhythm: Normal rate and regular rhythm.  Pulmonary:     Effort: Pulmonary effort is normal.     Breath sounds: Normal breath sounds.  Skin:    Comments: Laceration to scalp appears to be healing well.  There is still small residual hematoma overlying this area.  5 staples removed today.  Lacerations to forehead and nose appear to be healing well.  Absorbable suture will remain in place.  Neurological:     Mental Status: He is alert.     ------------------------------------------------------------------------------------------------------------------------------------------------------------------------------------------------------------------- Assessment and Plan  Scalp laceration Staples removed today.  I think this will continue to heal well and residual hematoma should resolve.  Dry nares Recommend saline spray and humidifier at home.  Adding 1 week course of mupirocin as well.   Meds ordered this encounter  Medications   mupirocin ointment (BACTROBAN) 2 %    Sig: Apply 1 application topically 2 (two) times daily for 7 days.    Dispense:  22 g    Refill:  0    No follow-ups on file.    This visit occurred during the SARS-CoV-2  public health emergency.  Safety protocols were in place, including screening questions prior to the visit, additional usage of staff PPE, and extensive cleaning of exam room while observing appropriate contact time as indicated for disinfecting solutions.

## 2021-02-18 NOTE — Patient Instructions (Signed)
Continue to keep areas clean.  Try nasal saline and humidifier Use mupirocin ointment twice daily x7 days inside the nose.

## 2021-02-18 NOTE — Assessment & Plan Note (Signed)
Staples removed today.  I think this will continue to heal well and residual hematoma should resolve.

## 2021-03-13 ENCOUNTER — Telehealth: Payer: Self-pay | Admitting: Neurology

## 2021-03-13 NOTE — Telephone Encounter (Signed)
Patient left vm stating he is having some coughing and feels like it is traveling to his chest. Can we call and get him a virtual appt with any provider to discuss?

## 2021-03-13 NOTE — Telephone Encounter (Signed)
Pt has been scheduled for a virtual visit for tomorrow morning with Caleen Jobs. AM

## 2021-03-14 ENCOUNTER — Encounter: Payer: Self-pay | Admitting: Family Medicine

## 2021-03-14 ENCOUNTER — Telehealth (INDEPENDENT_AMBULATORY_CARE_PROVIDER_SITE_OTHER): Payer: Medicare HMO | Admitting: Family Medicine

## 2021-03-14 DIAGNOSIS — J329 Chronic sinusitis, unspecified: Secondary | ICD-10-CM

## 2021-03-14 DIAGNOSIS — J40 Bronchitis, not specified as acute or chronic: Secondary | ICD-10-CM | POA: Diagnosis not present

## 2021-03-14 MED ORDER — AMOXICILLIN-POT CLAVULANATE 875-125 MG PO TABS: 1.0000 | ORAL_TABLET | Freq: Two times a day (BID) | ORAL | 0 refills | Status: AC

## 2021-03-14 MED ORDER — GUAIFENESIN ER 600 MG PO TB12: 1200.0000 mg | ORAL_TABLET | Freq: Two times a day (BID) | ORAL | 2 refills | Status: DC

## 2021-03-14 NOTE — Progress Notes (Signed)
Virtual Visit via Telephone Note  I connected with  Samuel Mcconnell on 03/14/21 at  8:10 AM EST by telephone and verified that I am speaking with the correct person using two identifiers.   I discussed the limitations, risks, security and privacy concerns of performing an evaluation and management service by telephone and the availability of in person appointments. I also discussed with the patient that there may be a patient responsible charge related to this service. The patient expressed understanding and agreed to proceed.  Participating parties included in this telephone visit include: The patient and the nurse practitioner listed.  The patient is: At home I am: In the office  Subjective:    CC: cough  HPI: Samuel Mcconnell is a 87 y.o. year old male presenting today via telephone visit to discuss cough.  Patient reporting 5 to 6 days of cough.  Reports the cough is productive, sometimes he is able to get up yellow sputum but not always.  He has had a mild headache and sinus pressure as well that improves with Tylenol.  He denies any ear pain, chest pain, dyspnea at rest, nausea, vomiting, diarrhea, nasal congestion, fevers.  Reports that his worst symptom is a hacking cough that can lead to coughing fits making it hard for him to catch his breath.    Past medical history, Surgical history, Family history not pertinant except as noted below, Social history, Allergies, and medications have been entered into the medical record, reviewed, and corrections made.   Review of Systems:  All review of systems negative except what is listed in the HPI  Objective:    General:  Patient speaking clearly in complete sentences. No shortness of breath noted.   Alert and oriented x3.   Normal judgment.  No apparent acute distress.  Impression and Recommendations:    1. Sinobronchitis - amoxicillin-clavulanate (AUGMENTIN) 875-125 MG tablet; Take 1 tablet by mouth 2 (two) times daily for 10 days.   Dispense: 20 tablet; Refill: 0 - guaiFENesin (MUCINEX) 600 MG 12 hr tablet; Take 2 tablets (1,200 mg total) by mouth 2 (two) times daily.  Dispense: 30 tablet; Refill: 2  Starting Augmentin given his symptoms going into a long weekend. Adding some mucinex as well. Continue supportive measures including rest, hydration, humidifier use, steam showers, warm compresses to sinuses, warm liquids with lemon and honey, and over-the-counter cough, cold, and analgesics as needed.  Patient aware of signs/symptoms requiring further/urgent evaluation.   Follow-up if symptoms worsen or fail to improve - prefer in-office follow-up if needed for further assessment.      I discussed the assessment and treatment plan with the patient. The patient was provided an opportunity to ask questions and all were answered. The patient agreed with the plan and demonstrated an understanding of the instructions.   The patient was advised to call back or seek an in-person evaluation if the symptoms worsen or if the condition fails to improve as anticipated.  I provided 20 minutes of non-face-to-face time during this TELEPHONE encounter.    Terrilyn Saver, NP

## 2021-04-11 DIAGNOSIS — I10 Essential (primary) hypertension: Secondary | ICD-10-CM | POA: Diagnosis not present

## 2021-04-11 DIAGNOSIS — I480 Paroxysmal atrial fibrillation: Secondary | ICD-10-CM | POA: Diagnosis not present

## 2021-04-12 LAB — COMPREHENSIVE METABOLIC PANEL
ALT: 22 IU/L (ref 0–44)
AST: 31 IU/L (ref 0–40)
Albumin/Globulin Ratio: 2 (ref 1.2–2.2)
Albumin: 4 g/dL (ref 3.5–4.6)
Alkaline Phosphatase: 62 IU/L (ref 44–121)
BUN/Creatinine Ratio: 14 (ref 10–24)
BUN: 22 mg/dL (ref 10–36)
Bilirubin Total: 0.4 mg/dL (ref 0.0–1.2)
CO2: 25 mmol/L (ref 20–29)
Calcium: 9.3 mg/dL (ref 8.6–10.2)
Chloride: 103 mmol/L (ref 96–106)
Creatinine, Ser: 1.52 mg/dL — ABNORMAL HIGH (ref 0.76–1.27)
Globulin, Total: 2 g/dL (ref 1.5–4.5)
Glucose: 82 mg/dL (ref 70–99)
Potassium: 4.8 mmol/L (ref 3.5–5.2)
Sodium: 140 mmol/L (ref 134–144)
Total Protein: 6 g/dL (ref 6.0–8.5)
eGFR: 43 mL/min/{1.73_m2} — ABNORMAL LOW (ref >59)

## 2021-04-12 LAB — TSH: TSH: 5.5 u[IU]/mL — ABNORMAL HIGH (ref 0.450–4.500)

## 2021-04-15 ENCOUNTER — Other Ambulatory Visit: Payer: Self-pay | Admitting: *Deleted

## 2021-04-15 DIAGNOSIS — I480 Paroxysmal atrial fibrillation: Secondary | ICD-10-CM

## 2021-04-15 DIAGNOSIS — Z79899 Other long term (current) drug therapy: Secondary | ICD-10-CM

## 2021-04-21 ENCOUNTER — Other Ambulatory Visit: Payer: Self-pay | Admitting: Cardiovascular Disease

## 2021-04-21 DIAGNOSIS — I480 Paroxysmal atrial fibrillation: Secondary | ICD-10-CM

## 2021-05-21 ENCOUNTER — Ambulatory Visit (INDEPENDENT_AMBULATORY_CARE_PROVIDER_SITE_OTHER): Payer: Medicare HMO | Admitting: Family Medicine

## 2021-05-21 ENCOUNTER — Other Ambulatory Visit: Payer: Self-pay

## 2021-05-21 ENCOUNTER — Encounter: Payer: Self-pay | Admitting: Family Medicine

## 2021-05-21 VITALS — BP 139/65 | HR 64 | Temp 97.8°F | Ht 71.0 in | Wt 190.0 lb

## 2021-05-21 DIAGNOSIS — J309 Allergic rhinitis, unspecified: Secondary | ICD-10-CM

## 2021-05-21 DIAGNOSIS — W010XXA Fall on same level from slipping, tripping and stumbling without subsequent striking against object, initial encounter: Secondary | ICD-10-CM | POA: Diagnosis not present

## 2021-05-21 DIAGNOSIS — I480 Paroxysmal atrial fibrillation: Secondary | ICD-10-CM | POA: Diagnosis not present

## 2021-05-21 DIAGNOSIS — I10 Essential (primary) hypertension: Secondary | ICD-10-CM | POA: Diagnosis not present

## 2021-05-21 MED ORDER — LOSARTAN POTASSIUM 25 MG PO TABS: 25.0000 mg | ORAL_TABLET | Freq: Every day | ORAL | 1 refills | Status: DC

## 2021-05-21 MED ORDER — FLUTICASONE PROPIONATE 50 MCG/ACT NA SUSP: 2.0000 | Freq: Every day | NASAL | 3 refills | Status: DC

## 2021-05-21 NOTE — Patient Instructions (Signed)
Great to see you! ?If areas on legs and arms are not healing well let me know.  ?See me again in 6 months or sooner if needed.  We'll plan to get a full lab panel at this visit.  ?

## 2021-05-21 NOTE — Assessment & Plan Note (Signed)
>>  ASSESSMENT AND PLAN FOR PAF (PAROXYSMAL ATRIAL FIBRILLATION) ([facility]) WRITTEN ON 05/21/2021 10:44 PM BY MATTHEWS, CODY, DO  Regular rate and rhythm on exam today.  Management per cardiology.  Stable with amiodarone .  Tolerating Eliquis  for anticoagulation.

## 2021-05-21 NOTE — Assessment & Plan Note (Signed)
Regular rate and rhythm on exam today.  Management per cardiology.  Stable with amiodarone.  Tolerating Eliquis for anticoagulation. ?

## 2021-05-21 NOTE — Assessment & Plan Note (Signed)
Blood pressure remains well controlled at this time.  Recommend continuation of current medications. ?

## 2021-05-21 NOTE — Progress Notes (Signed)
?Samuel Mcconnell - 88 y.o. male MRN [id number]  Date of birth: 07-15-1927 ? ?Subjective ?Chief Complaint  ?Patient presents with  ? Follow-up  ? Thyroid Problem  ? ? ?HPI ?Samuel Mcconnell is a 88 year old male here today for follow-up visit.  Reports he is doing well at this time.  He did have a recent fall after slipping when getting out of bed.  He does have a small skin tear on his left lower leg and right forearm.  ? ?He does continue to see cardiology on a regular basis for management of atrial fibrillation.  Stable with amiodarone as well as Eliquis for anticoagulation.  He denies chest pain, shortness of breath, palpitations, headaches or vision changes. ? ?TSH was recently elevated.  His cardiologist plans to recheck this again in 3 months.  He denies any symptoms related to hypothyroidism. ? ?ROS:  A comprehensive ROS was completed and negative except as noted per HPI ? ?Allergies  ?Allergen Reactions  ? Clindamycin/Lincomycin Other (See Comments)  ?  Pt felt weird  ?  ? Dabigatran Etexilate Mesylate Other (See Comments)  ?  "felt weird"  ? Doxycycline Other (See Comments)  ?  Dyspepsia  ? ? ?Past Medical History:  ?Diagnosis Date  ? Atrial fibrillation ([place])   ? radiofrequency ablation  ? Emphysema   ? Hypertension   ? Thyroid disease   ? ? ?Past Surgical History:  ?Procedure Laterality Date  ? CARDIAC SURGERY    ? CARDIOVERSION N/A 10/24/2019  ? Procedure: CARDIOVERSION;  Surgeon: Sanda Klein, MD;  Location: [place];  Service: Cardiovascular;  Laterality: N/A;  ? CARDIOVERSION N/A 10/22/2020  ? Procedure: CARDIOVERSION;  Surgeon: Thayer Headings, MD;  Location: [place];  Service: Cardiovascular;  Laterality: N/A;  ? [place]  ? KNEE ARTHROSCOPY Left 07/2000  ? LOOP RECORDER IMPLANT  01/16/2010  ? Medtronic Reveal XT (Dr. Norlene Duel)   ? RADIOFREQUENCY ABLATION  06/13/2009  ? SHOULDER ARTHROSCOPY WITH ROTATOR CUFF REPAIR Left 04/1996  ? TIBIAL TUBERCLERPLASTY  ~ 10/2010  ? TRANSTHORACIC ECHOCARDIOGRAM   11/19/2011  ? EF=>55%; mild MR, mild mitral annular calcif; mild TR, normal RSVP; AV mildly sclerotic, mild AV regurg; trace pulm valve regurg   ? ? ?Social History  ? ?Socioeconomic History  ? Marital status: Married  ?  Spouse name: Barnett Applebaum  ? Number of children: 2  ? Years of education: 24  ? Highest education level: Bachelor's degree (e.g., BA, AB, BS)  ?Occupational History  ? Occupation: Retired  ?Tobacco Use  ? Smoking status: Former  ?  Years: 46.00  ?  Types: Cigarettes  ?  Quit date: 03/14/2002  ?  Years since quitting: 19.2  ? Smokeless tobacco: Never  ? Tobacco comments:  ?  Former smoker 01/23/2021  ?Vaping Use  ? Vaping Use: Never used  ?Substance and Sexual Activity  ? Alcohol use: No  ? Drug use: No  ? Sexual activity: Not Currently  ?  Partners: Female  ?Other Topics Concern  ? Not on file  ?Social History Narrative  ? Lives at [facility] [place] with his wife. He is still driving. He enjoys golf and gardening (when is he is able to).  ? ?Social Determinants of Health  ? ?Financial Resource Strain: Low Risk   ? Difficulty of Paying Living Expenses: Not hard at all  ?Food Insecurity: No Food Insecurity  ? Worried About Charity fundraiser in the Last Year: Never true  ? Ran Out of Food  in the Last Year: Never true  ?Transportation Needs: No Transportation Needs  ? Lack of Transportation (Medical): No  ? Lack of Transportation (Non-Medical): No  ?Physical Activity: Sufficiently Active  ? Days of Exercise per Week: 4 days  ? Minutes of Exercise per Session: 50 min  ?Stress: No Stress Concern Present  ? Feeling of Stress : Not at all  ?Social Connections: Socially Integrated  ? Frequency of Communication with Friends and Family: More than three times a week  ? Frequency of Social Gatherings with Friends and Family: More than three times a week  ? Attends Religious Services: More than 4 times per year  ? Active Member of Clubs or Organizations: Yes  ? Attends Archivist Meetings: Never  ? Marital  Status: Married  ? ? ?Family History  ?Problem Relation Age of Onset  ? Heart failure Father   ?     MI - died @ 29  ? Heart failure Mother   ? Diabetes Son   ? Pancreatitis Son   ?     also ETOH abuse   ? Cancer Maternal Grandmother   ? Heart disease Maternal Grandfather   ? Atrial fibrillation Brother   ?     dx'ed age 75  ? Atrial fibrillation Brother   ?     dx'ed age 68  ? Heart failure Brother   ? Atrial fibrillation Sister   ? Hypertension Sister   ? Heart Problems Sister   ? Diabetes Sister   ? Heart failure Sister   ? ? ?Health Maintenance  ?Topic Date Due  ? TETANUS/TDAP  09/06/2029  ? Pneumonia Vaccine 3+ Years old  Completed  ? INFLUENZA VACCINE  Completed  ? COVID-19 Vaccine  Completed  ? Zoster Vaccines- Shingrix  Completed  ? HPV VACCINES  Aged Out  ? ? ? ?----------------------------------------------------------------------------------------------------------------------------------------------------------------------------------------------------------------- ?Physical Exam ?BP 139/65   Pulse 64   Temp 97.8 ?F (36.6 ?C)   Ht 5\' 11"  (1.803 m)   Wt 190 lb (86.2 kg)   SpO2 96%   BMI 26.50 kg/m?  ? ?Physical Exam ?Constitutional:   ?   Appearance: Normal appearance.  ?Eyes:  ?   General: No scleral icterus. ?Cardiovascular:  ?   Rate and Rhythm: Normal rate and regular rhythm.  ?Musculoskeletal:  ?   Cervical back: Neck supple.  ?Skin: ?   Comments: Small skin tear on right arm.  Skin is reapproximated well.  No bleeding noted. ? ?Small skin tear on the left lower leg.  Skin is reapproximated well on this.  No significant bleeding noted.  ?Neurological:  ?   Mental Status: He is alert.  ?Psychiatric:     ?   Mood and Affect: Mood normal.     ?   Behavior: Behavior normal.   ? ? ?------------------------------------------------------------------------------------------------------------------------------------------------------------------------------------------------------------------- ?Assessment and Plan ? ?PAF (paroxysmal atrial fibrillation) ([place]) ?Regular rate and rhythm on exam today.  Management per cardiology.  Stable with amiodarone.  Tolerating Eliquis for anticoagulation. ? ?Essential hypertension ?Blood pressure remains well controlled at this time.  Recommend continuation of current medications. ? ?Fall from slip, trip, or stumble, initial encounter ?Skin tear on forearm and left lower extremity from recent fall.  We discussed using socks or slippers with rubberized or gripper bottoms. ? ? ?Meds ordered this encounter  ?Medications  ? fluticasone (FLONASE) 50 MCG/ACT nasal spray  ?  Sig: Place 2 sprays into both nostrils daily.  ?  Dispense:  16 g  ?  Refill:  3  ? losartan (COZAAR) 25 MG tablet  ?  Sig: Take 1 tablet (25 mg total) by mouth at bedtime.  ?  Dispense:  90 tablet  ?  Refill:  1  ? ? ?Return in about 6 months (around 11/21/2021) for HTN/Thyroid. ? ? ? ?This visit occurred during the SARS-CoV-2 public health emergency.  Safety protocols were in place, including screening questions prior to the visit, additional usage of staff PPE, and extensive cleaning of exam room while observing appropriate contact time as indicated for disinfecting solutions.  ? ?

## 2021-05-21 NOTE — Assessment & Plan Note (Signed)
Skin tear on forearm and left lower extremity from recent fall.  We discussed using socks or slippers with rubberized or gripper bottoms. ?

## 2021-05-23 DIAGNOSIS — L97921 Non-pressure chronic ulcer of unspecified part of left lower leg limited to breakdown of skin: Secondary | ICD-10-CM | POA: Diagnosis not present

## 2021-05-23 DIAGNOSIS — B351 Tinea unguium: Secondary | ICD-10-CM | POA: Diagnosis not present

## 2021-06-19 ENCOUNTER — Other Ambulatory Visit: Payer: Self-pay | Admitting: Family Medicine

## 2021-06-19 DIAGNOSIS — I517 Cardiomegaly: Secondary | ICD-10-CM

## 2021-06-23 ENCOUNTER — Emergency Department
Admission: EM | Admit: 2021-06-23 | Discharge: 2021-06-23 | Disposition: A | Discharge status: Home / Self Care | Payer: Medicare HMO | Source: Home / Self Care

## 2021-06-23 ENCOUNTER — Emergency Department (INDEPENDENT_AMBULATORY_CARE_PROVIDER_SITE_OTHER): Payer: Medicare HMO

## 2021-06-23 DIAGNOSIS — R059 Cough, unspecified: Secondary | ICD-10-CM

## 2021-06-23 DIAGNOSIS — J449 Chronic obstructive pulmonary disease, unspecified: Secondary | ICD-10-CM

## 2021-06-23 DIAGNOSIS — I4891 Unspecified atrial fibrillation: Secondary | ICD-10-CM | POA: Diagnosis not present

## 2021-06-23 DIAGNOSIS — I700 Atherosclerosis of aorta: Secondary | ICD-10-CM | POA: Diagnosis not present

## 2021-06-23 DIAGNOSIS — J189 Pneumonia, unspecified organism: Secondary | ICD-10-CM | POA: Diagnosis not present

## 2021-06-23 MED ORDER — AMOXICILLIN-POT CLAVULANATE 875-125 MG PO TABS: 1.0000 | ORAL_TABLET | Freq: Two times a day (BID) | ORAL | 0 refills | Status: DC

## 2021-06-23 MED ORDER — AZITHROMYCIN 250 MG PO TABS: 250.0000 mg | ORAL_TABLET | Freq: Every day | ORAL | 0 refills | Status: DC

## 2021-06-23 MED ORDER — BENZONATATE 200 MG PO CAPS: 200.0000 mg | ORAL_CAPSULE | Freq: Three times a day (TID) | ORAL | 0 refills | Status: AC | PRN

## 2021-06-23 MED ORDER — PROMETHAZINE-DM 6.25-15 MG/5ML PO SYRP: 5.0000 mL | ORAL_SOLUTION | Freq: Two times a day (BID) | ORAL | 0 refills | Status: DC | PRN

## 2021-06-23 NOTE — Discharge Instructions (Addendum)
Advised/informed family of CXR results this morning.  Advised wife and daughter please take medication as directed with food to completion.  Advised patient may take Tessalon Perles daily or as needed for cough.  Advised may use Promethazine DM prior to sleep for exacerbated cough.  Advised do not use Promethazine DM and Tessalon Perles together.  Encouraged patient to increase daily water intake while taking these medications.  Advised patient if symptoms worsen and/or unresolved please follow-up with PCP or here for further evaluation. ?

## 2021-06-23 NOTE — ED Provider Notes (Signed)
?[place] ? ? ? ?CSN: [id number] ?Arrival date & time: 06/23/21  0488 ? ? ?  ? ?History   ?Chief Complaint ?Chief Complaint  ?Patient presents with  ? Cough  ?  Nasal congestion and coughing. X3 days  ? ? ?HPI ?Samuel Mcconnell is a 88 y.o. male.  ? ?HPI 88 year old male presents with nasal congestion and coughing for 3 days.  Patient is vaccinated for COVID-19 and influenza.  Patient is accompanied by his this morning.  PMH significant for paroxysmal A-fib, COPD, HTN, LVH, and CKD stage III. ? ?Past Medical History:  ?Diagnosis Date  ? Atrial fibrillation ([place])   ? radiofrequency ablation  ? Emphysema   ? Hypertension   ? Thyroid disease   ? ? ?Patient Active Problem List  ? Diagnosis Date Noted  ? Scalp laceration 02/18/2021  ? Dry nares 02/18/2021  ? Restless legs 11/18/2020  ? Persistent atrial fibrillation ([place]) 10/14/2020  ? Secondary hypercoagulable state ([place]) 10/14/2020  ? Well adult exam 05/07/2020  ? Gait instability 11/26/2019  ? Hypothyroid 11/26/2019  ? Atypical atrial flutter ([place])   ? Fall from slip, trip, or stumble, initial encounter 09/08/2019  ? Bilateral lower extremity edema 09/08/2019  ? 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 Chronic obstructive pulmonary disease ([place]) 09/14/2017  ? Essential hypertension 09/14/2017  ? Myringotomy tube status 02/25/2017  ? LVH (left ventricular hypertrophy) 03/28/2016  ? Gallbladder calculus without cholecystitis 07/22/2014  ? Fatty liver 07/22/2014  ? PAF (paroxysmal atrial fibrillation) ([place]) 03/15/2013  ? CKD (chronic kidney disease) stage 3, GFR 30-59 ml/min ([facility]) 02/21/2013  ? Allergic rhinitis 06/24/2012  ? ? ?Past Surgical History:  ?Procedure Laterality Date  ? CARDIAC SURGERY    ? CARDIOVERSION N/A 10/24/2019  ? Procedure: CARDIOVERSION;  Surgeon: Sanda Klein, MD;  Location: [facility];  Service: Cardiovascular;  Laterality: N/A;  ? CARDIOVERSION N/A 10/22/2020  ? Procedure: CARDIOVERSION;  Surgeon: Thayer Headings, MD;  Location: [facility];   Service: Cardiovascular;  Laterality: N/A;  ? [place]  ? KNEE ARTHROSCOPY Left 07/2000  ? LOOP RECORDER IMPLANT  01/16/2010  ? [employer] Reveal XT (Dr. Norlene Duel)   ? RADIOFREQUENCY ABLATION  06/13/2009  ? SHOULDER ARTHROSCOPY WITH ROTATOR CUFF REPAIR Left 04/1996  ? TIBIAL TUBERCLERPLASTY  ~ 10/2010  ? TRANSTHORACIC ECHOCARDIOGRAM  11/19/2011  ? EF=>55%; mild MR, mild mitral annular calcif; mild TR, normal RSVP; AV mildly sclerotic, mild AV regurg; trace pulm valve regurg   ? ? ? ? ? ?Home Medications   ? ?Prior to Admission medications   ?Medication Sig Start Date End Date Taking? Authorizing Provider  ?acetaminophen (TYLENOL) 500 MG tablet Take 500 mg by mouth daily as needed for moderate pain.   Yes [provider]  ?amiodarone (PACERONE) 200 MG tablet TAKE 1 TABLET EVERY DAY 04/21/21  Yes Croitoru, Mihai, MD  ?amoxicillin-clavulanate (AUGMENTIN) 875-125 MG tablet Take 1 tablet by mouth every 12 (twelve) hours for 10 days. 06/23/21 07/03/21 Yes Eliezer Lofts, FNP  ?azithromycin (ZITHROMAX) 250 MG tablet Take 1 tablet (250 mg total) by mouth daily. Take first 2 tablets together, then 1 every day until finished. 06/23/21  Yes Eliezer Lofts, FNP  ?benzonatate (TESSALON) 200 MG capsule Take 1 capsule (200 mg total) by mouth 3 (three) times daily as needed for up to 7 days for cough. 06/23/21 06/30/21 Yes Eliezer Lofts, FNP  ?Boswellia-Glucosamine-Vit D (OSTEO BI-FLEX ONE PER DAY) TABS Take 1 tablet by mouth daily.   Yes [provider]  ?Cholecalciferol (DIALYVITE VITAMIN D 5000) 125 MCG (5000 UT) capsule Take 5,000 Units by mouth daily with lunch.   Yes [provider]  ?diphenhydramine-acetaminophen (TYLENOL PM) 25-500 MG TABS tablet Take 1 tablet by mouth at bedtime.   Yes [provider]  ?ELIQUIS 2.5 MG TABS tablet TAKE 1 TABLET TWICE DAILY (DOSE CHANGE) 01/06/21  Yes Croitoru, Mihai, MD  ?fluticasone (FLONASE) 50 MCG/ACT nasal spray Place 2 sprays into both nostrils daily.  05/21/21 08/19/21 Yes Luetta Nutting, DO  ?fluticasone-salmeterol (ADVAIR) 250-50 MCG/ACT AEPB INHALE 1 PUFF TWICE DAILY INTO THE LUNGS (NEED MD APPOINTMENT FOR REFILLS) 01/24/21  Yes Croitoru, Mihai, MD  ?furosemide (LASIX) 20 MG tablet TAKE 1 TABLET EVERY DAY AS NEEDED FOR  EDEMA 06/19/21  Yes Luetta Nutting, DO  ?Ketotifen Fumarate (ITCHY EYE DROPS OP) Place 1 drop into both eyes daily as needed (itchy eyes).   Yes [provider]  ?levothyroxine (SYNTHROID) 137 MCG tablet TAKE 1 TABLET (137 MCG TOTAL) BY MOUTH DAILY BEFORE BREAKFAST. 10/02/20  Yes Luetta Nutting, DO  ?losartan (COZAAR) 25 MG tablet Take 1 tablet (25 mg total) by mouth at bedtime. 05/21/21  Yes Luetta Nutting, DO  ?Multiple Vitamin (MULTIVITAMIN) capsule Take 1 capsule by mouth daily.   Yes [provider]  ?promethazine-dextromethorphan (PROMETHAZINE-DM) 6.25-15 MG/5ML syrup Take 5 mLs by mouth 2 (two) times daily as needed for cough. 06/23/21  Yes Eliezer Lofts, FNP  ?vitamin C (ASCORBIC ACID) 500 MG tablet Take 500 mg by mouth daily with lunch.   Yes [provider]  ?zinc gluconate 50 MG tablet Take 50 mg by mouth daily with lunch.   Yes [provider]  ? ? ?Family History ?Family History  ?Problem Relation Age of Onset  ? Heart failure Father   ?     MI - died @ 84  ? Heart failure Mother   ? Diabetes Son   ? Pancreatitis Son   ?     also ETOH abuse   ? Cancer Maternal Grandmother   ? Heart disease Maternal Grandfather   ? Atrial fibrillation Brother   ?     dx'ed age 20  ? Atrial fibrillation Brother   ?     dx'ed age 29  ? Heart failure Brother   ? Atrial fibrillation Sister   ? Hypertension Sister   ? Heart Problems Sister   ? Diabetes Sister   ? Heart failure Sister   ? ? ?Social History ?Social History  ? ?Tobacco Use  ? Smoking status: Former  ?  Years: 46.00  ?  Types: Cigarettes  ?  Quit date: 03/14/2002  ?  Years since quitting: 19.2  ? Smokeless tobacco: Never  ? Tobacco comments:  ?  Former smoker  01/23/2021  ?Vaping Use  ? Vaping Use: Never used  ?Substance Use Topics  ? Alcohol use: No  ? Drug use: No  ? ? ? ?Allergies   ?Clindamycin/lincomycin, Dabigatran etexilate mesylate, and Doxycycline ? ? ?Review of Systems ?Review of Systems  ?HENT:  Positive for congestion.   ?Respiratory:  Positive for cough.   ?All other systems reviewed and are negative. ? ? ?Physical Exam ?Triage Vital Signs ?ED Triage Vitals [06/23/21 0938]  ?Enc Vitals Group  ?   BP   ?   Pulse   ?   Resp   ?   Temp   ?   Temp src   ?   SpO2   ?   Weight  186 lb (84.4 kg)  ?   Height 6' (1.829 m)  ?   Head Circumference   ?   Peak Flow   ?   Pain Score 0  ?   Pain Loc   ?   Pain Edu?   ?   Excl. in [place]?   ? ?No data found. ? ?Updated Vital Signs ?BP (!) 160/82 (BP Location: Right Arm)   Pulse 80   Temp 98.9 ?F (37.2 ?C) (Oral)   Resp 16   Ht 6' (1.829 m)   Wt 186 lb (84.4 kg)   SpO2 98%   BMI 25.23 kg/m?  ? ?Physical Exam ?Vitals and nursing note reviewed.  ?Constitutional:   ?   General: He is not in acute distress. ?   Appearance: Normal appearance. He is normal weight. He is ill-appearing.  ?HENT:  ?   Head: Normocephalic and atraumatic.  ?   Right Ear: Tympanic membrane, ear canal and external ear normal.  ?   Left Ear: Tympanic membrane, ear canal and external ear normal.  ?   Mouth/Throat:  ?   Mouth: Mucous membranes are moist.  ?   Pharynx: Oropharynx is clear.  ?Eyes:  ?   Extraocular Movements: Extraocular movements intact.  ?   Conjunctiva/sclera: Conjunctivae normal.  ?   Pupils: Pupils are equal, round, and reactive to light.  ?Cardiovascular:  ?   Rate and Rhythm: Normal rate and regular rhythm.  ?   Pulses: Normal pulses.  ?   Heart sounds: Normal heart sounds.  ?Pulmonary:  ?   Effort: Pulmonary effort is normal. No respiratory distress.  ?   Breath sounds: Rhonchi present. No wheezing or rales.  ?   Comments: Diffuse scattered rhonchi, diminished breath sounds bibasilarly, and infrequent nonproductive cough  noted ?Musculoskeletal:     ?   General: Normal range of motion.  ?   Cervical back: Normal range of motion and neck supple.  ?Skin: ?   General: Skin is warm and dry.  ?Neurological:  ?   General: No focal deficit present.

## 2021-06-23 NOTE — ED Triage Notes (Signed)
Pt states that he has some nasal congestion, coughing, and fatigue. X3 days ? ? ?Pt is vaccinated for covid.  ?Pt has had flu vaccine.  ?

## 2021-07-02 ENCOUNTER — Inpatient Hospital Stay (HOSPITAL_COMMUNITY)
Admission: EM | Admit: 2021-07-02 | Discharge: 2021-07-05 | DRG: 194 | Disposition: A | Discharge status: Home Health | Payer: Medicare HMO | Attending: Internal Medicine | Admitting: Internal Medicine

## 2021-07-02 ENCOUNTER — Encounter (HOSPITAL_COMMUNITY): Payer: Self-pay | Admitting: *Deleted

## 2021-07-02 ENCOUNTER — Emergency Department (HOSPITAL_COMMUNITY): Payer: Medicare HMO

## 2021-07-02 DIAGNOSIS — E039 Hypothyroidism, unspecified: Secondary | ICD-10-CM | POA: Diagnosis present

## 2021-07-02 DIAGNOSIS — I480 Paroxysmal atrial fibrillation: Secondary | ICD-10-CM | POA: Diagnosis present

## 2021-07-02 DIAGNOSIS — J432 Centrilobular emphysema: Secondary | ICD-10-CM | POA: Diagnosis not present

## 2021-07-02 DIAGNOSIS — R627 Adult failure to thrive: Secondary | ICD-10-CM | POA: Diagnosis present

## 2021-07-02 DIAGNOSIS — R9431 Abnormal electrocardiogram [ECG] [EKG]: Secondary | ICD-10-CM | POA: Diagnosis not present

## 2021-07-02 DIAGNOSIS — Z87891 Personal history of nicotine dependence: Secondary | ICD-10-CM

## 2021-07-02 DIAGNOSIS — J189 Pneumonia, unspecified organism: Principal | ICD-10-CM | POA: Diagnosis present

## 2021-07-02 DIAGNOSIS — I440 Atrioventricular block, first degree: Secondary | ICD-10-CM | POA: Diagnosis present

## 2021-07-02 DIAGNOSIS — Z881 Allergy status to other antibiotic agents status: Secondary | ICD-10-CM | POA: Diagnosis not present

## 2021-07-02 DIAGNOSIS — E871 Hypo-osmolality and hyponatremia: Secondary | ICD-10-CM | POA: Diagnosis not present

## 2021-07-02 DIAGNOSIS — Z888 Allergy status to other drugs, medicaments and biological substances status: Secondary | ICD-10-CM

## 2021-07-02 DIAGNOSIS — Z7951 Long term (current) use of inhaled steroids: Secondary | ICD-10-CM | POA: Diagnosis not present

## 2021-07-02 DIAGNOSIS — J439 Emphysema, unspecified: Secondary | ICD-10-CM | POA: Diagnosis not present

## 2021-07-02 DIAGNOSIS — R109 Unspecified abdominal pain: Secondary | ICD-10-CM | POA: Diagnosis not present

## 2021-07-02 DIAGNOSIS — I5032 Chronic diastolic (congestive) heart failure: Secondary | ICD-10-CM | POA: Diagnosis present

## 2021-07-02 DIAGNOSIS — J441 Chronic obstructive pulmonary disease with (acute) exacerbation: Secondary | ICD-10-CM | POA: Diagnosis not present

## 2021-07-02 DIAGNOSIS — Z8249 Family history of ischemic heart disease and other diseases of the circulatory system: Secondary | ICD-10-CM

## 2021-07-02 DIAGNOSIS — I700 Atherosclerosis of aorta: Secondary | ICD-10-CM | POA: Diagnosis not present

## 2021-07-02 DIAGNOSIS — Z66 Do not resuscitate: Secondary | ICD-10-CM | POA: Diagnosis present

## 2021-07-02 DIAGNOSIS — I10 Essential (primary) hypertension: Secondary | ICD-10-CM | POA: Diagnosis present

## 2021-07-02 DIAGNOSIS — R0602 Shortness of breath: Secondary | ICD-10-CM | POA: Diagnosis not present

## 2021-07-02 DIAGNOSIS — J690 Pneumonitis due to inhalation of food and vomit: Secondary | ICD-10-CM | POA: Diagnosis present

## 2021-07-02 DIAGNOSIS — Z20822 Contact with and (suspected) exposure to covid-19: Secondary | ICD-10-CM | POA: Diagnosis not present

## 2021-07-02 DIAGNOSIS — J449 Chronic obstructive pulmonary disease, unspecified: Principal | ICD-10-CM | POA: Diagnosis present

## 2021-07-02 DIAGNOSIS — Z79899 Other long term (current) drug therapy: Secondary | ICD-10-CM

## 2021-07-02 DIAGNOSIS — Z7901 Long term (current) use of anticoagulants: Secondary | ICD-10-CM

## 2021-07-02 DIAGNOSIS — I4819 Other persistent atrial fibrillation: Secondary | ICD-10-CM | POA: Diagnosis present

## 2021-07-02 DIAGNOSIS — I110 Hypertensive heart disease with heart failure: Secondary | ICD-10-CM | POA: Diagnosis present

## 2021-07-02 DIAGNOSIS — R0600 Dyspnea, unspecified: Secondary | ICD-10-CM | POA: Diagnosis not present

## 2021-07-02 DIAGNOSIS — Z79890 Hormone replacement therapy (postmenopausal): Secondary | ICD-10-CM | POA: Diagnosis not present

## 2021-07-02 DIAGNOSIS — I4510 Unspecified right bundle-branch block: Secondary | ICD-10-CM | POA: Diagnosis present

## 2021-07-02 LAB — MAGNESIUM: Magnesium: 1.9 mg/dL (ref 1.7–2.4)

## 2021-07-02 LAB — CBC WITH DIFFERENTIAL/PLATELET
Abs Immature Granulocytes: 0.08 10*3/uL — ABNORMAL HIGH (ref 0.00–0.07)
Basophils Absolute: 0 10*3/uL (ref 0.0–0.1)
Basophils Relative: 0 %
Eosinophils Absolute: 0.1 10*3/uL (ref 0.0–0.5)
Eosinophils Relative: 1 %
HCT: 33.9 % — ABNORMAL LOW (ref 39.0–52.0)
Hemoglobin: 11 g/dL — ABNORMAL LOW (ref 13.0–17.0)
Immature Granulocytes: 1 %
Lymphocytes Relative: 14 %
Lymphs Abs: 1.4 10*3/uL (ref 0.7–4.0)
MCH: 29.1 pg (ref 26.0–34.0)
MCHC: 32.4 g/dL (ref 30.0–36.0)
MCV: 89.7 fL (ref 80.0–100.0)
Monocytes Absolute: 1.1 10*3/uL — ABNORMAL HIGH (ref 0.1–1.0)
Monocytes Relative: 11 %
Neutro Abs: 7.4 10*3/uL (ref 1.7–7.7)
Neutrophils Relative %: 73 %
Platelets: 433 10*3/uL — ABNORMAL HIGH (ref 150–400)
RBC: 3.78 MIL/uL — ABNORMAL LOW (ref 4.22–5.81)
RDW: 13.8 % (ref 11.5–15.5)
WBC: 10.1 10*3/uL (ref 4.0–10.5)
nRBC: 0 % (ref 0.0–0.2)

## 2021-07-02 LAB — BRAIN NATRIURETIC PEPTIDE: B Natriuretic Peptide: 131.1 pg/mL — ABNORMAL HIGH (ref 0.0–100.0)

## 2021-07-02 LAB — COMPREHENSIVE METABOLIC PANEL
ALT: 23 U/L (ref 0–44)
AST: 30 U/L (ref 15–41)
Albumin: 2.9 g/dL — ABNORMAL LOW (ref 3.5–5.0)
Alkaline Phosphatase: 44 U/L (ref 38–126)
Anion gap: 7 (ref 5–15)
BUN: 18 mg/dL (ref 8–23)
CO2: 26 mmol/L (ref 22–32)
Calcium: 8.9 mg/dL (ref 8.9–10.3)
Chloride: 99 mmol/L (ref 98–111)
Creatinine, Ser: 1.34 mg/dL — ABNORMAL HIGH (ref 0.61–1.24)
GFR, Estimated: 50 mL/min — ABNORMAL LOW (ref >60)
Glucose, Bld: 101 mg/dL — ABNORMAL HIGH (ref 70–99)
Potassium: 4.7 mmol/L (ref 3.5–5.1)
Sodium: 132 mmol/L — ABNORMAL LOW (ref 135–145)
Total Bilirubin: 0.7 mg/dL (ref 0.3–1.2)
Total Protein: 6.1 g/dL — ABNORMAL LOW (ref 6.5–8.1)

## 2021-07-02 LAB — RESP PANEL BY RT-PCR (FLU A&B, COVID) ARPGX2
Influenza A by PCR: NEGATIVE
Influenza B by PCR: NEGATIVE
SARS Coronavirus 2 by RT PCR: NEGATIVE

## 2021-07-02 LAB — TROPONIN I (HIGH SENSITIVITY)
Troponin I (High Sensitivity): 11 ng/L (ref <18)
Troponin I (High Sensitivity): 10 ng/L (ref <18)

## 2021-07-02 MED ORDER — LEVOTHYROXINE SODIUM 25 MCG PO TABS: 125.0000 ug | ORAL_TABLET | Freq: Every day | ORAL | Status: DC

## 2021-07-02 MED ORDER — VITAMIN D3 25 MCG (1000 UNIT) PO TABS
5000.0000 [IU] | ORAL_TABLET | Freq: Every day | ORAL | Status: DC
  Administered 2021-07-04: 5000 [IU] via ORAL
  Filled 2021-07-02 (×3): qty 5

## 2021-07-02 MED ORDER — METHYLPREDNISOLONE SODIUM SUCC 125 MG IJ SOLR
80.0000 mg | Freq: Every day | INTRAMUSCULAR | Status: DC
  Administered 2021-07-03: 80 mg via INTRAVENOUS
  Filled 2021-07-02: qty 2

## 2021-07-02 MED ORDER — BENZONATATE 100 MG PO CAPS: 100.0000 mg | ORAL_CAPSULE | Freq: Three times a day (TID) | ORAL | Status: DC | PRN

## 2021-07-02 MED ORDER — METHYLPREDNISOLONE SODIUM SUCC 125 MG IJ SOLR
125.0000 mg | Freq: Once | INTRAMUSCULAR | Status: AC
  Administered 2021-07-02: 125 mg via INTRAVENOUS
  Filled 2021-07-02: qty 2

## 2021-07-02 MED ORDER — FLUTICASONE PROPIONATE 50 MCG/ACT NA SUSP
2.0000 | Freq: Every day | NASAL | Status: DC | PRN
  Administered 2021-07-04: 2 via NASAL
  Filled 2021-07-02: qty 16

## 2021-07-02 MED ORDER — SODIUM CHLORIDE 0.9 % IV SOLN
2.0000 g | Freq: Two times a day (BID) | INTRAVENOUS | Status: DC
  Administered 2021-07-02 – 2021-07-05 (×6): 2 g via INTRAVENOUS
  Filled 2021-07-02 (×6): qty 12.5

## 2021-07-02 MED ORDER — MULTIVITAMINS PO CAPS: 1.0000 | ORAL_CAPSULE | Freq: Every day | ORAL | Status: DC

## 2021-07-02 MED ORDER — ACETAMINOPHEN 500 MG PO TABS: 500.0000 mg | ORAL_TABLET | Freq: Every day | ORAL | Status: DC | PRN

## 2021-07-02 MED ORDER — DIPHENHYDRAMINE-APAP (SLEEP) 25-500 MG PO TABS: 1.0000 | ORAL_TABLET | Freq: Every evening | ORAL | Status: DC | PRN

## 2021-07-02 MED ORDER — APIXABAN 2.5 MG PO TABS
2.5000 mg | ORAL_TABLET | Freq: Two times a day (BID) | ORAL | Status: DC
  Administered 2021-07-02 – 2021-07-05 (×6): 2.5 mg via ORAL
  Filled 2021-07-02 (×6): qty 1

## 2021-07-02 MED ORDER — FUROSEMIDE 10 MG/ML IJ SOLN
40.0000 mg | Freq: Once | INTRAMUSCULAR | Status: AC
Start: 2021-07-02 — End: 2021-07-02
  Administered 2021-07-02: 40 mg via INTRAVENOUS
  Filled 2021-07-02: qty 4

## 2021-07-02 MED ORDER — AMIODARONE HCL 200 MG PO TABS
200.0000 mg | ORAL_TABLET | Freq: Every day | ORAL | Status: DC
  Administered 2021-07-03 – 2021-07-05 (×3): 200 mg via ORAL
  Filled 2021-07-02 (×3): qty 1

## 2021-07-02 MED ORDER — ALBUTEROL SULFATE (2.5 MG/3ML) 0.083% IN NEBU
2.5000 mg | INHALATION_SOLUTION | Freq: Once | RESPIRATORY_TRACT | Status: AC
  Administered 2021-07-02: 2.5 mg via RESPIRATORY_TRACT
  Filled 2021-07-02: qty 3

## 2021-07-02 MED ORDER — LEVOTHYROXINE SODIUM 25 MCG PO TABS
137.0000 ug | ORAL_TABLET | Freq: Every day | ORAL | Status: DC
  Administered 2021-07-03 – 2021-07-05 (×3): 137 ug via ORAL
  Filled 2021-07-02 (×3): qty 1

## 2021-07-02 MED ORDER — IOHEXOL 300 MG/ML  SOLN
100.0000 mL | Freq: Once | INTRAMUSCULAR | Status: AC | PRN
  Administered 2021-07-02: 100 mL via INTRAVENOUS

## 2021-07-02 MED ORDER — ADULT MULTIVITAMIN W/MINERALS CH
1.0000 | ORAL_TABLET | Freq: Every day | ORAL | Status: DC
  Administered 2021-07-03 – 2021-07-05 (×3): 1 via ORAL
  Filled 2021-07-02 (×3): qty 1

## 2021-07-02 MED ORDER — IPRATROPIUM BROMIDE 0.02 % IN SOLN
0.5000 mg | Freq: Once | RESPIRATORY_TRACT | Status: AC
  Administered 2021-07-02: 0.5 mg via RESPIRATORY_TRACT
  Filled 2021-07-02: qty 2.5

## 2021-07-02 MED ORDER — GUAIFENESIN ER 600 MG PO TB12
600.0000 mg | ORAL_TABLET | Freq: Two times a day (BID) | ORAL | Status: DC
  Administered 2021-07-02 – 2021-07-05 (×6): 600 mg via ORAL
  Filled 2021-07-02 (×6): qty 1

## 2021-07-02 MED ORDER — IPRATROPIUM-ALBUTEROL 0.5-2.5 (3) MG/3ML IN SOLN
3.0000 mL | Freq: Three times a day (TID) | RESPIRATORY_TRACT | Status: DC
  Administered 2021-07-02 – 2021-07-04 (×4): 3 mL via RESPIRATORY_TRACT
  Filled 2021-07-02 (×2): qty 3
  Filled 2021-07-02: qty 6
  Filled 2021-07-02: qty 3

## 2021-07-02 NOTE — ED Notes (Signed)
Pt hand cold to touch, O2 not picking up well. Pt states he is not short of breath, given warm blanket to warm hands and will recheck ?

## 2021-07-02 NOTE — ED Provider Triage Note (Signed)
Emergency Medicine Provider Triage Evaluation Note ? ?TRUE Garciamartinez , a 88 y.o. male  was evaluated in triage.  Pt complains of persistent cough, shortness of breath.  Patient has history of COPD, A-fib.  States over the past week he has been sleeping in a recliner, has peripheral edema, cough is productive of yellow sputum.  Denies current fever.  He states shortness of breath is worse enough that he is unable to use his daily inhaler.  Does have moderate conversational dyspnea on exam.  Bruising on the abdomen which is new.  Family states they noticed that today.  Denies any trauma or injury. ? ?He is maintaining his sats on room air in the low 90s.  Significant coughing spells during exam. ? ?Review of Systems  ?Positive: As above ?Negative: As above ? ?Physical Exam  ?BP (!) 115/55 (BP Location: Left Arm)   Pulse 66   Temp 98.5 ?F (36.9 ?C) (Oral)   Resp 15   SpO2 91%  ?Gen:   Awake, no distress   ?Resp:  Normal effort  ?MSK:   Moves extremities without difficulty  ?Other:  Bruising noted to the abdomen.  Abdomen tender with guarding. ? ?Medical Decision Making  ?Medically screening exam initiated at 12:00 PM.  Appropriate orders placed.  Ammon Muscatello was informed that the remainder of the evaluation will be completed by another provider, this initial triage assessment does not replace that evaluation, and the importance of remaining in the ED until their evaluation is complete. ? ? ?  ?Evlyn Courier, PA-C ?07/02/21 1204 ? ?

## 2021-07-02 NOTE — ED Triage Notes (Signed)
Patient and family member reports pt was [facility] and diagnosed with possible right pneumonia, given antibiotics x 10 days. Has productive cough with yellow sputum, no fever, reports not feeling any better. No resp distress is noted at triage. ?

## 2021-07-02 NOTE — ED Notes (Signed)
The  pt  has pneumonia and has been coughing more for the past week  coughing on arrival  sometimes productive  bruises over ahd back and abdomen ?

## 2021-07-02 NOTE — ED Provider Notes (Signed)
?[place] ?Provider Note ? ? ?CSN: [id number] ?Arrival date & time: 07/02/21  1014 ? ?  ? ?History ? ?Chief Complaint  ?Patient presents with  ? Shortness of Breath  ? Cough  ? ? ?Samuel Mcconnell is a 88 y.o. male. ? ?Patient has ready finished the Zithromax.  Has 1 day left of Augmentin.  Was diagnosed with pneumonia about 9 days ago.  Still with cough and shortness of breath especially with exertion.  Still with sputum production.  Denies any fevers or chills.  Was not treated with steroids.  Breathing treatments at home have not helped much. ? ?The history is provided by the patient.  ?Shortness of Breath ?Severity:  Moderate ?Onset quality:  Gradual ?Duration:  1 week ?Timing:  Constant ?Progression:  Worsening ?Chronicity:  New ?Context comment:  COIPD, afib hx, treat for pneumonia recently ?Relieved by:  Nothing ?Worsened by:  Deep breathing ?Associated symptoms: abdominal pain, cough and wheezing   ?Associated symptoms: no chest pain, no claudication, no diaphoresis, no ear pain, no fever, no headaches, no hemoptysis, no neck pain, no sore throat, no sputum production, no syncope and no swollen glands   ?Cough ?Associated symptoms: shortness of breath and wheezing   ?Associated symptoms: no chest pain, no diaphoresis, no ear pain, no fever, no headaches and no sore throat   ? ?  ? ?Home Medications ?Prior to Admission medications   ?Medication Sig Start Date End Date Taking? Authorizing Provider  ?Ketotifen Fumarate (ITCHY EYE DROPS OP) Place 1 drop into both eyes daily as needed (itchy eyes).   Yes [provider]  ?acetaminophen (TYLENOL) 500 MG tablet Take 500 mg by mouth daily as needed for moderate pain.    [provider]  ?amiodarone (PACERONE) 200 MG tablet TAKE 1 TABLET EVERY DAY 04/21/21   Croitoru, Mihai, MD  ?amoxicillin-clavulanate (AUGMENTIN) 875-125 MG tablet Take 1 tablet by mouth every 12 (twelve) hours for 10 days. 06/23/21 07/03/21  Eliezer Lofts, FNP  ?azithromycin (ZITHROMAX) 250 MG tablet Take 1 tablet (250 mg total) by mouth daily. Take first 2 tablets together, then 1 every day until finished. ?Patient not taking: Reported on 07/02/2021 06/23/21   Eliezer Lofts, FNP  ?Boswellia-Glucosamine-Vit D (OSTEO BI-FLEX ONE PER DAY) TABS Take 1 tablet by mouth daily.    [provider]  ?Cholecalciferol (DIALYVITE VITAMIN D 5000) 125 MCG (5000 UT) capsule Take 5,000 Units by mouth daily with lunch.    [provider]  ?diphenhydramine-acetaminophen (TYLENOL PM) 25-500 MG TABS tablet Take 1 tablet by mouth at bedtime.    [provider]  ?ELIQUIS 2.5 MG TABS tablet TAKE 1 TABLET TWICE DAILY (DOSE CHANGE) 01/06/21   Croitoru, Mihai, MD  ?fluticasone (FLONASE) 50 MCG/ACT nasal spray Place 2 sprays into both nostrils daily. 05/21/21 08/19/21  Luetta Nutting, DO  ?fluticasone-salmeterol (ADVAIR) 250-50 MCG/ACT AEPB INHALE 1 PUFF TWICE DAILY INTO THE LUNGS (NEED MD APPOINTMENT FOR REFILLS) 01/24/21   Croitoru, Mihai, MD  ?furosemide (LASIX) 20 MG tablet TAKE 1 TABLET EVERY DAY AS NEEDED FOR  EDEMA 06/19/21   Luetta Nutting, DO  ?levothyroxine (SYNTHROID) 137 MCG tablet TAKE 1 TABLET (137 MCG TOTAL) BY MOUTH DAILY BEFORE BREAKFAST. 10/02/20   Luetta Nutting, DO  ?losartan (COZAAR) 25 MG tablet Take 1 tablet (25 mg total) by mouth at bedtime. 05/21/21   Luetta Nutting, DO  ?Multiple Vitamin (MULTIVITAMIN) capsule Take 1 capsule by mouth daily.    [provider]  ?promethazine-dextromethorphan (PROMETHAZINE-DM)  6.25-15 MG/5ML syrup Take 5 mLs by mouth 2 (two) times daily as needed for cough. ?Patient not taking: Reported on 07/02/2021 06/23/21   Eliezer Lofts, FNP  ?vitamin C (ASCORBIC ACID) 500 MG tablet Take 500 mg by mouth daily with lunch.    [provider]  ?zinc gluconate 50 MG tablet Take 50 mg by mouth daily with lunch.    [provider]  ?   ? ?Allergies    ?Clindamycin/lincomycin, Dabigatran etexilate  mesylate, and Doxycycline   ? ?Review of Systems   ?Review of Systems  ?Constitutional:  Negative for diaphoresis and fever.  ?HENT:  Negative for ear pain and sore throat.   ?Respiratory:  Positive for cough, shortness of breath and wheezing. Negative for hemoptysis and sputum production.   ?Cardiovascular:  Negative for chest pain, claudication and syncope.  ?Gastrointestinal:  Positive for abdominal pain.  ?Musculoskeletal:  Negative for neck pain.  ?Neurological:  Negative for headaches.  ?Hematological:  Bruises/bleeds easily (new bruising to abdmoen).  ? ?Physical Exam ?Updated Vital Signs ?BP (!) 120/51   Pulse 65   Temp 98.5 ?F (36.9 ?C) (Oral)   Resp (!) 22   SpO2 91%  ?Physical Exam ?Vitals and nursing note reviewed.  ?Constitutional:   ?   General: He is not in acute distress. ?   Appearance: He is well-developed. He is not ill-appearing.  ?HENT:  ?   Head: Normocephalic and atraumatic.  ?Eyes:  ?   Extraocular Movements: Extraocular movements intact.  ?   Conjunctiva/sclera: Conjunctivae normal.  ?   Pupils: Pupils are equal, round, and reactive to light.  ?Cardiovascular:  ?   Rate and Rhythm: Normal rate and regular rhythm.  ?   Pulses: Normal pulses.  ?   Heart sounds: Normal heart sounds. No murmur heard. ?Pulmonary:  ?   Effort: Tachypnea present. No respiratory distress.  ?   Breath sounds: Decreased breath sounds and wheezing present.  ?Abdominal:  ?   Palpations: Abdomen is soft.  ?   Tenderness: There is no abdominal tenderness.  ?Musculoskeletal:     ?   General: No swelling.  ?   Cervical back: Neck supple.  ?Skin: ?   General: Skin is warm and dry.  ?   Capillary Refill: Capillary refill takes less than 2 seconds.  ?Neurological:  ?   General: No focal deficit present.  ?   Mental Status: He is alert.  ?Psychiatric:     ?   Mood and Affect: Mood normal.  ? ? ?ED Results / Procedures / Treatments   ?Labs ?(all labs ordered are listed, but only abnormal results are displayed) ?Labs Reviewed   ?CBC WITH DIFFERENTIAL/PLATELET - Abnormal; Notable for the following components:  ?    Result Value  ? RBC 3.78 (*)   ? Hemoglobin 11.0 (*)   ? HCT 33.9 (*)   ? Platelets 433 (*)   ? Monocytes Absolute 1.1 (*)   ? Abs Immature Granulocytes 0.08 (*)   ? All other components within normal limits  ?COMPREHENSIVE METABOLIC PANEL - Abnormal; Notable for the following components:  ? Sodium 132 (*)   ? Glucose, Bld 101 (*)   ? Creatinine, Ser 1.34 (*)   ? Total Protein 6.1 (*)   ? Albumin 2.9 (*)   ? GFR, Estimated 50 (*)   ? All other components within normal limits  ?BRAIN NATRIURETIC PEPTIDE - Abnormal; Notable for the following components:  ? B Natriuretic Peptide 131.1 (*)   ?  All other components within normal limits  ?RESP PANEL BY RT-PCR (FLU A&B, COVID) ARPGX2  ?MAGNESIUM  ?TROPONIN I (HIGH SENSITIVITY)  ?TROPONIN I (HIGH SENSITIVITY)  ? ? ?EKG ?EKG Interpretation ? ?Date/Time:  Wednesday July 02 2021 11:32:33 EDT ?Ventricular Rate:  71 ?PR Interval:  256 ?QRS Duration: 176 ?QT Interval:  458 ?QTC Calculation: 497 ?R Axis:   168 ?Text Interpretation: Sinus rhythm with 1st degree A-V block with Premature supraventricular complexes and with occasional Premature ventricular complexes Right bundle branch block Abnormal ECG artifact` When compared with ECG of 09-Feb-2021 22:11, PREVIOUS ECG IS PRESENT Confirmed by Lennice Sites [phone]) on 07/02/2021 4:10:41 PM ? ?Radiology ?DG Chest 2 View ? ?Result Date: 07/02/2021 ?CLINICAL DATA:  Dyspnea EXAM: CHEST - 2 VIEW COMPARISON:  06/23/2021 FINDINGS: Bilateral lower lobe airspace disease concerning for multilobar pneumonia. No significant pleural effusion. No pneumothorax. Stable cardiomediastinal silhouette. No aggressive osseous lesion. IMPRESSION: 1. Bilateral lower lobe airspace disease concerning for multilobar pneumonia. Electronically Signed   By: Kathreen Devoid M.D.   On: 07/02/2021 12:26  ? ?CT Angio Chest PE W and/or Wo Contrast ? ?Result Date: 07/02/2021 ?CLINICAL  DATA:  sob EXAM: CT ANGIOGRAPHY CHEST WITH CONTRAST TECHNIQUE: Multidetector CT imaging of the chest was performed using the standard protocol during bolus administration of intravenous contrast. Multiplanar CT i

## 2021-07-02 NOTE — H&P (Signed)
?History and Physical  ? ? ?Samuel Mcconnell DZH:[id number] DOB: 05-10-1927 DOA: 07/02/2021 ? ?PCP: Luetta Nutting, DO ?Patient coming from: home ? ?I have personally briefly reviewed patient's old medical records in [place] ? ?Chief Complaint: cough, sob,  ? ?HPI: Samuel Mcconnell is a 88 y.o. male with medical history significant of afib on eliquis, chronic diastolic CHF, COPD ?Was seen at urgent care 10 days ago for cough found to have pneumonia he finished 9 days of antibiotics, report continue have cough and feeling more short of breath ? ?ED Course:  ?Data reviewed: ?Blood pressure (!) 120/51, pulse 65, temperature 98.5 ?F (36.9 ?C), temperature source Oral, resp. rate (!) 22, SpO2 91 % on room air ? ?CTA chest no PE, showed multifocal pneumonia ?CT ab/pel without acute findings ? ?WBC 10, sodium 132, creatinine 1.34 (which is close to baseline),  BNP 131, troponin 11-10 ? ?Per EDP lung exam is consistent with COPD which is tight and wheezing , he received Solu-Medrol and nebulizer treatment, also received cefepime  for pneumonia that failed outpatient antibiotic therapy ? ?COVID screening ordered not collected yet ? ?Hospitalist called to admit the patient ? ? ?Review of Systems: As per HPI otherwise all other systems reviewed and are negative. ? ? ?Past Medical History:  ?Diagnosis Date  ? Atrial fibrillation ([place])   ? radiofrequency ablation  ? Emphysema   ? Hypertension   ? Thyroid disease   ? ? ?Past Surgical History:  ?Procedure Laterality Date  ? CARDIAC SURGERY    ? CARDIOVERSION N/A 10/24/2019  ? Procedure: CARDIOVERSION;  Surgeon: Sanda Klein, MD;  Location: [facility];  Service: Cardiovascular;  Laterality: N/A;  ? CARDIOVERSION N/A 10/22/2020  ? Procedure: CARDIOVERSION;  Surgeon: Thayer Headings, MD;  Location: [facility];  Service: Cardiovascular;  Laterality: N/A;  ? [place]  ? KNEE ARTHROSCOPY Left 07/2000  ? LOOP RECORDER IMPLANT  01/16/2010  ? [employer] Reveal XT (Dr. Norlene Duel)   ? RADIOFREQUENCY ABLATION  06/13/2009  ? SHOULDER ARTHROSCOPY WITH ROTATOR CUFF REPAIR Left 04/1996  ? TIBIAL TUBERCLERPLASTY  ~ 10/2010  ? TRANSTHORACIC ECHOCARDIOGRAM  11/19/2011  ? EF=>55%; mild MR, mild mitral annular calcif; mild TR, normal RSVP; AV mildly sclerotic, mild AV regurg; trace pulm valve regurg   ? ? ?Social History ? reports that he quit smoking about 19 years ago. His smoking use included cigarettes. He has never used smokeless tobacco. He reports that he does not drink alcohol and does not use drugs. ? ?Allergies  ?Allergen Reactions  ? Clindamycin/Lincomycin Other (See Comments)  ?  Patient "felt weird" ?  ? Dabigatran Etexilate Mesylate Other (See Comments)  ?  "felt weird" (name brand Pradaxa)  ? Doxycycline Other (See Comments)  ?  Dyspepsia  ? ? ?Family History  ?Problem Relation Age of Onset  ? Heart failure Father   ?     MI - died @ 76  ? Heart failure Mother   ? Diabetes Son   ? Pancreatitis Son   ?     also ETOH abuse   ? Cancer Maternal Grandmother   ? Heart disease Maternal Grandfather   ? Atrial fibrillation Brother   ?     dx'ed age 36  ? Atrial fibrillation Brother   ?     dx'ed age 20  ? Heart failure Brother   ? Atrial fibrillation Sister   ? Hypertension Sister   ? Heart Problems Sister   ? Diabetes Sister   ?  Heart failure Sister   ? ? ?Prior to Admission medications   ?Medication Sig Start Date End Date Taking? Authorizing Provider  ?acetaminophen (TYLENOL) 500 MG tablet Take 500 mg by mouth daily as needed for moderate pain.   Yes [provider]  ?albuterol (VENTOLIN HFA) 108 (90 Base) MCG/ACT inhaler Inhale 2 puffs into the lungs every 6 (six) hours as needed for wheezing or shortness of breath.   Yes [provider]  ?amiodarone (PACERONE) 200 MG tablet TAKE 1 TABLET EVERY DAY ?Patient taking differently: Take 200 mg by mouth daily. 04/21/21  Yes Croitoru, Mihai, MD  ?amoxicillin-clavulanate (AUGMENTIN) 875-125 MG tablet Take 1 tablet by mouth every  12 (twelve) hours for 10 days. 06/23/21 07/03/21 Yes Eliezer Lofts, FNP  ?benzonatate (TESSALON) 200 MG capsule Take 200 mg by mouth 3 (three) times daily.   Yes [provider]  ?Cholecalciferol (VITAMIN D3) 125 MCG (5000 UT) TABS Take 5,000 Units by mouth daily with lunch.   Yes [provider]  ?DELSYM 30 MG/5ML liquid Take 15-30 mg by mouth every 12 (twelve) hours as needed for cough.   Yes [provider]  ?diphenhydramine-acetaminophen (TYLENOL PM) 25-500 MG TABS tablet Take 1 tablet by mouth at bedtime as needed (for sleeplessness).   Yes [provider]  ?ELIQUIS 2.5 MG TABS tablet TAKE 1 TABLET TWICE DAILY (DOSE CHANGE) ?Patient taking differently: Take 2.5 mg by mouth 2 (two) times daily. 01/06/21  Yes Croitoru, Dani Gobble, MD  ?fluticasone-salmeterol (ADVAIR) 250-50 MCG/ACT AEPB INHALE 1 PUFF TWICE DAILY INTO THE LUNGS (NEED MD APPOINTMENT FOR REFILLS) ?Patient taking differently: Inhale 1 puff into the lungs in the morning and at bedtime. 01/24/21  Yes Croitoru, Mihai, MD  ?Ketotifen Fumarate (ITCHY EYE DROPS OP) Place 1 drop into both eyes daily as needed (itchy eyes).   Yes [provider]  ?levothyroxine (SYNTHROID) 137 MCG tablet TAKE 1 TABLET (137 MCG TOTAL) BY MOUTH DAILY BEFORE BREAKFAST. 10/02/20  Yes Luetta Nutting, DO  ?losartan (COZAAR) 50 MG tablet Take 25 mg by mouth See admin instructions. Take 25 mg by mouth every other night only if diastolic reading is at least 50 or greater   Yes [provider]  ?azithromycin (ZITHROMAX) 250 MG tablet Take 1 tablet (250 mg total) by mouth daily. Take first 2 tablets together, then 1 every day until finished. ?Patient not taking: Reported on 07/02/2021 06/23/21   Eliezer Lofts, FNP  ?Boswellia-Glucosamine-Vit D (OSTEO BI-FLEX ONE PER DAY) TABS Take 1 tablet by mouth daily. ?Patient not taking: Reported on 07/02/2021    [provider]  ?fluticasone (FLONASE) 50 MCG/ACT nasal spray Place 2 sprays into  both nostrils daily. ?Patient taking differently: Place 2 sprays into both nostrils daily as needed for allergies or rhinitis. 05/21/21 08/19/21  Luetta Nutting, DO  ?furosemide (LASIX) 20 MG tablet TAKE 1 TABLET EVERY DAY AS NEEDED FOR  EDEMA ?Patient not taking: Reported on 07/02/2021 06/19/21   Luetta Nutting, DO  ?losartan (COZAAR) 25 MG tablet Take 1 tablet (25 mg total) by mouth at bedtime. ?Patient not taking: Reported on 07/02/2021 05/21/21   Luetta Nutting, DO  ?Multiple Vitamin (MULTIVITAMIN) capsule Take 1 capsule by mouth daily.    [provider]  ?promethazine-dextromethorphan (PROMETHAZINE-DM) 6.25-15 MG/5ML syrup Take 5 mLs by mouth 2 (two) times daily as needed for cough. ?Patient not taking: Reported on 07/02/2021 06/23/21   Eliezer Lofts, FNP  ?vitamin C (ASCORBIC ACID) 500 MG tablet Take 500 mg by mouth daily with lunch. ?  Patient not taking: Reported on 07/02/2021    [provider]  ?zinc gluconate 50 MG tablet Take 50 mg by mouth daily with lunch. ?Patient not taking: Reported on 07/02/2021    [provider]  ? ? ?Physical Exam: ?Vitals:  ? 07/02/21 1129 07/02/21 1437  ?BP: (!) 115/55 (!) 120/51  ?Pulse: 66 65  ?Resp: 15 (!) 22  ?Temp: 98.5 ?F (36.9 ?C)   ?TempSrc: Oral   ?SpO2: 91% 91%  ? ? ?Constitutional: NAD, calm, aaox3, hard of hearing ?Eyes: PERRL, lids and conjunctivae normal ?ENMT: Mucous membranes are moist.  ?Respiratory:  mild scattered wheezing, diminished breath sound right lower lobe. Normal respiratory effort. No accessory muscle use. + cough ?Cardiovascular: Regular rate and rhythm,   ?Abdomen: no tenderness, not distended, Bowel sounds positive.  ?Musculoskeletal: + bilateral lower extremity pitting edema, right > left .  ?Skin: no rashes, lesions, ulcers. No induration ?Neurologic: CN 2-12 grossly intact. Sensation intact, generalized weakness  ?Psychiatric: Normal judgment and insight. Alert and oriented x 3. Normal mood.  ? ? ?Labs on Admission: I have  personally reviewed following labs and imaging studies ? ?CBC: ?Recent Labs  ?Lab 07/02/21 ?1232  ?WBC 10.1  ?NEUTROABS 7.4  ?HGB 11.0*  ?HCT 33.9*  ?MCV 89.7  ?PLT 433*  ? ? ?Basic Metabolic Panel: ?Recent Labs

## 2021-07-02 NOTE — ED Notes (Signed)
Admitting MD at bedside.

## 2021-07-03 ENCOUNTER — Other Ambulatory Visit: Payer: Self-pay

## 2021-07-03 DIAGNOSIS — J189 Pneumonia, unspecified organism: Secondary | ICD-10-CM | POA: Diagnosis not present

## 2021-07-03 LAB — COMPREHENSIVE METABOLIC PANEL
ALT: 24 U/L (ref 0–44)
AST: 28 U/L (ref 15–41)
Albumin: 2.7 g/dL — ABNORMAL LOW (ref 3.5–5.0)
Alkaline Phosphatase: 44 U/L (ref 38–126)
Anion gap: 8 (ref 5–15)
BUN: 21 mg/dL (ref 8–23)
CO2: 28 mmol/L (ref 22–32)
Calcium: 9 mg/dL (ref 8.9–10.3)
Chloride: 99 mmol/L (ref 98–111)
Creatinine, Ser: 1.49 mg/dL — ABNORMAL HIGH (ref 0.61–1.24)
GFR, Estimated: 44 mL/min — ABNORMAL LOW (ref >60)
Glucose, Bld: 177 mg/dL — ABNORMAL HIGH (ref 70–99)
Potassium: 4.8 mmol/L (ref 3.5–5.1)
Sodium: 135 mmol/L (ref 135–145)
Total Bilirubin: 0.6 mg/dL (ref 0.3–1.2)
Total Protein: 6.1 g/dL — ABNORMAL LOW (ref 6.5–8.1)

## 2021-07-03 LAB — TSH: TSH: 1.145 u[IU]/mL (ref 0.350–4.500)

## 2021-07-03 LAB — PROCALCITONIN: Procalcitonin: <0.1 ng/mL

## 2021-07-03 LAB — CBC
HCT: 34.2 % — ABNORMAL LOW (ref 39.0–52.0)
Hemoglobin: 11.5 g/dL — ABNORMAL LOW (ref 13.0–17.0)
MCH: 29.5 pg (ref 26.0–34.0)
MCHC: 33.6 g/dL (ref 30.0–36.0)
MCV: 87.7 fL (ref 80.0–100.0)
Platelets: 460 10*3/uL — ABNORMAL HIGH (ref 150–400)
RBC: 3.9 MIL/uL — ABNORMAL LOW (ref 4.22–5.81)
RDW: 13.4 % (ref 11.5–15.5)
WBC: 7.4 10*3/uL (ref 4.0–10.5)
nRBC: 0 % (ref 0.0–0.2)

## 2021-07-03 LAB — STREP PNEUMONIAE URINARY ANTIGEN: Strep Pneumo Urinary Antigen: NEGATIVE

## 2021-07-03 LAB — MRSA NEXT GEN BY PCR, NASAL: MRSA by PCR Next Gen: NOT DETECTED

## 2021-07-03 MED ORDER — METHYLPREDNISOLONE SODIUM SUCC 40 MG IJ SOLR
40.0000 mg | Freq: Every day | INTRAMUSCULAR | Status: DC
  Administered 2021-07-04 – 2021-07-05 (×2): 40 mg via INTRAVENOUS
  Filled 2021-07-03 (×2): qty 1

## 2021-07-03 NOTE — Progress Notes (Signed)
?PROGRESS NOTE ? ? ? ?Samuel Mcconnell  ONG:[id number] DOB: [birth year] DOA: 07/02/2021 ?PCP: Luetta Nutting, DO  ? ? ? ?Brief Narrative:  ?Samuel Mcconnell is a 88 y.o. male with medical history significant of afib on eliquis, chronic diastolic CHF, COPD ?Was seen at urgent care 10 days ago for cough found to have pneumonia he finished 9 days of antibiotics, report continue have cough and feeling more short of breath ?  ?Subjective: ? ?Reports urinated a lot after lasix yesterday, lower extremity edema has much improved ?Still coughs, denies chest pain, no fever, no wheezing today ?Family at bedside  ? ?Assessment & Plan: ? Principal Problem: ?  Multifocal pneumonia ?Active Problems: ?  PAF (paroxysmal atrial fibrillation) ([place]) ?  Chronic obstructive pulmonary disease (H[facility]) ?  Essential hypertension ? ? ?Pneumonia failed outpatient treatment/COPD exacerbation ?Negative COVID screening ?MRSA screening negative ?sputum culture collected on 4/13, in process ?Procalcitonin level <0.1 ?Still coughs, wheezing has resolved on exam, continue to have rhonchi on lung exam ?Continue steroid, nebulizer, antibiotics, add on Mucinex ?Follow-up CT chest in 6-8 weeks following treatment is recommended to ensure complete resolution. ? ? ?  ?A-fib, paroxysmal ?Currently sinus rhythm, with first-degree AV block, chronic right bundle branch block ?Continue home medication amiodarone and Eliquis ?  ?Diastolic CHF ?Edema much improved after iv lasix on 4/12, will hold off lasix today,  monitor bp,  volume status and cr  ?  ?Hypertension ?Bp stable currently  ?  ?Hypothyroidism ?Continue synthroid  ?  ?FTT, PT eval ? ?Incidental findings on CT chest /ab/pel: ? ?Left pleural calcified plaque, nonspecific but can be seen in the ?setting of asbestos exposure or previous trauma/injury ? ?Borderline aneurysm of the ascending thoracic aorta measures 4.0 ?cm. Recommend annual imaging followup by CTA or MRA. This ?recommendation follows 2010 ? ? 1.4 cm  noncalcified stone versus mass in the dependent aspect of ?the nondilated gallbladder. Recommend elective outpatient ultrasound ?for further characterization. ?   ? ? ? ?I have Reviewed nursing notes, Vitals, pain scores, I/o's, Lab results and  imaging results since pt's last encounter, details please see discussion above  ?I ordered the following labs:  ?Unresulted Labs (From admission, onward)  ? ?  Start     Ordered  ? 07/04/21 0500  CBC with Differential/Platelet  Tomorrow morning,   R       ? 07/03/21 1811  ? 07/04/21 4401  Basic metabolic panel  Tomorrow morning,   R       ? 07/03/21 1811  ? 07/04/21 0500  Magnesium  Tomorrow morning,   R       ? 07/03/21 1811  ? 07/04/21 0500  Brain natriuretic peptide  Tomorrow morning,   R       ? 07/03/21 1811  ? 07/03/21 0500  Procalcitonin  Daily,   R     ? 07/02/21 2024  ? 07/02/21 1842  Expectorated Sputum Assessment w Gram Stain, Rflx to Resp Cult  Once,   R       ? 07/02/21 1841  ? ?  ?  ? ?  ? ? ? ?DVT prophylaxis: apixaban (ELIQUIS) tablet 2.5 mg Start: 07/02/21 2200 ?apixaban (ELIQUIS) tablet 2.5 mg  ? ?Code Status:   Code Status: DNR ? ?Family Communication: family at bedside  ?Disposition:  ? ?Status is: Inpatient ? ?Dispo: The patient is from: home , independent living      ?  Anticipated d/c is to: Home , may need home health           ?             Anticipated d/c date is: 24-48hrs pending clinical improvement, follow up on sputum culture  ? ?Antimicrobials:   ? ?Anti-infectives (From admission, onward)  ? ? Start     Dose/Rate Route Frequency Ordered Stop  ? 07/02/21 1700  ceFEPIme (MAXIPIME) 2 g in sodium chloride 0.9 % 100 mL IVPB       ? 2 g ?200 mL/hr over 30 Minutes Intravenous Every 12 hours 07/02/21 1641    ? ?  ? ? ? ? ? ?Objective: ?Vitals:  ? 07/03/21 1200 07/03/21 1500 07/03/21 1600 07/03/21 1702  ?BP: 129/71 115/60 138/70 108/66  ?Pulse: 95 75 75 79  ?Resp: (!) '22 15 16 18  '$ ?Temp:    99 ?F (37.2 ?C)  ?TempSrc:    Oral  ?SpO2: 94% 96%  94% 93%  ? ? ?Intake/Output Summary (Last 24 hours) at 07/03/2021 1811 ?Last data filed at 07/03/2021 1709 ?Gross per 24 hour  ?Intake 100 ml  ?Output 3000 ml  ?Net -2900 ml  ? ?There were no vitals filed for this visit. ? ?Examination: ? ?General exam: alert, awake, appear stronger, pleasant ?Respiratory system: improved aeration,wheezing has resolved , continue to have rhonchi, more on left lower basis, Respiratory effort normal, continue to have intermittent cough ?Cardiovascular system:  RRR.  ?Gastrointestinal system: Abdomen is nondistended, soft and nontender.  Normal bowel sounds heard. ?Central nervous system: Alert and oriented. No focal neurological deficits. ?Extremities:  bilateral lower extremity pitting edema has much improved ?Skin: No rashes, lesions or ulcers ?Psychiatry: Judgement and insight appear normal. Mood & affect appropriate.  ? ? ? ?Data Reviewed: I have personally reviewed  labs and visualized  imaging studies since the last encounter and formulate the plan  ? ? ? ? ? ? ?Scheduled Meds: ? amiodarone  200 mg Oral Daily  ? apixaban  2.5 mg Oral BID  ? cholecalciferol  5,000 Units Oral Q lunch  ? guaiFENesin  600 mg Oral BID  ? ipratropium-albuterol  3 mL Nebulization TID  ? levothyroxine  137 mcg Oral Q0600  ? [START ON 07/04/2021] methylPREDNISolone (SOLU-MEDROL) injection  40 mg Intravenous Daily  ? multivitamin with minerals  1 tablet Oral Daily  ? ?Continuous Infusions: ? ceFEPime (MAXIPIME) IV Stopped (07/03/21 0845)  ? ? ? LOS: 1 day  ? ? ? ?Florencia Reasons, MD PhD FACP ?[facility] ? ?Available via Epic secure chat 7am-7pm for nonurgent issues ?Please page for urgent issues ?To page the attending provider between 7A-7P or the covering provider during after hours 7P-7A, please log into the web site [email or web address] and access using universal [place] password for that web site. If you do not have the password, please call the hospital operator. ? ? ? ?07/03/2021, 6:11 PM  ? ? ?

## 2021-07-04 ENCOUNTER — Inpatient Hospital Stay (HOSPITAL_COMMUNITY): Payer: Medicare HMO

## 2021-07-04 DIAGNOSIS — J189 Pneumonia, unspecified organism: Secondary | ICD-10-CM | POA: Diagnosis not present

## 2021-07-04 LAB — BASIC METABOLIC PANEL
Anion gap: 8 (ref 5–15)
BUN: 29 mg/dL — ABNORMAL HIGH (ref 8–23)
CO2: 24 mmol/L (ref 22–32)
Calcium: 8.6 mg/dL — ABNORMAL LOW (ref 8.9–10.3)
Chloride: 98 mmol/L (ref 98–111)
Creatinine, Ser: 1.49 mg/dL — ABNORMAL HIGH (ref 0.61–1.24)
GFR, Estimated: 44 mL/min — ABNORMAL LOW (ref >60)
Glucose, Bld: 166 mg/dL — ABNORMAL HIGH (ref 70–99)
Potassium: 4.5 mmol/L (ref 3.5–5.1)
Sodium: 130 mmol/L — ABNORMAL LOW (ref 135–145)

## 2021-07-04 LAB — CBC WITH DIFFERENTIAL/PLATELET
Abs Immature Granulocytes: 0.18 10*3/uL — ABNORMAL HIGH (ref 0.00–0.07)
Basophils Absolute: 0 10*3/uL (ref 0.0–0.1)
Basophils Relative: 0 %
Eosinophils Absolute: 0 10*3/uL (ref 0.0–0.5)
Eosinophils Relative: 0 %
HCT: 32.4 % — ABNORMAL LOW (ref 39.0–52.0)
Hemoglobin: 11 g/dL — ABNORMAL LOW (ref 13.0–17.0)
Immature Granulocytes: 1 %
Lymphocytes Relative: 3 %
Lymphs Abs: 0.8 10*3/uL (ref 0.7–4.0)
MCH: 29.7 pg (ref 26.0–34.0)
MCHC: 34 g/dL (ref 30.0–36.0)
MCV: 87.6 fL (ref 80.0–100.0)
Monocytes Absolute: 0.5 10*3/uL (ref 0.1–1.0)
Monocytes Relative: 2 %
Neutro Abs: 23.2 10*3/uL — ABNORMAL HIGH (ref 1.7–7.7)
Neutrophils Relative %: 94 %
Platelets: 492 10*3/uL — ABNORMAL HIGH (ref 150–400)
RBC: 3.7 MIL/uL — ABNORMAL LOW (ref 4.22–5.81)
RDW: 13.3 % (ref 11.5–15.5)
WBC: 24.7 10*3/uL — ABNORMAL HIGH (ref 4.0–10.5)
nRBC: 0 % (ref 0.0–0.2)

## 2021-07-04 LAB — MAGNESIUM: Magnesium: 2 mg/dL (ref 1.7–2.4)

## 2021-07-04 LAB — PROCALCITONIN: Procalcitonin: <0.1 ng/mL

## 2021-07-04 LAB — BRAIN NATRIURETIC PEPTIDE: B Natriuretic Peptide: 182.4 pg/mL — ABNORMAL HIGH (ref 0.0–100.0)

## 2021-07-04 MED ORDER — BUDESONIDE 0.25 MG/2ML IN SUSP
0.2500 mg | Freq: Two times a day (BID) | RESPIRATORY_TRACT | Status: DC
  Administered 2021-07-04 – 2021-07-05 (×3): 0.25 mg via RESPIRATORY_TRACT
  Filled 2021-07-04 (×3): qty 2

## 2021-07-04 MED ORDER — MELATONIN 3 MG PO TABS
3.0000 mg | ORAL_TABLET | Freq: Every day | ORAL | Status: DC
  Administered 2021-07-04: 3 mg via ORAL
  Filled 2021-07-04: qty 1

## 2021-07-04 MED ORDER — IPRATROPIUM-ALBUTEROL 0.5-2.5 (3) MG/3ML IN SOLN: 3.0000 mL | Freq: Two times a day (BID) | RESPIRATORY_TRACT | Status: DC

## 2021-07-04 MED ORDER — SALINE SPRAY 0.65 % NA SOLN
1.0000 | NASAL | Status: DC | PRN
  Filled 2021-07-04: qty 44

## 2021-07-04 MED ORDER — IPRATROPIUM-ALBUTEROL 0.5-2.5 (3) MG/3ML IN SOLN
3.0000 mL | Freq: Two times a day (BID) | RESPIRATORY_TRACT | Status: DC
  Administered 2021-07-04 – 2021-07-05 (×2): 3 mL via RESPIRATORY_TRACT
  Filled 2021-07-04 (×2): qty 3

## 2021-07-04 MED ORDER — FLUTICASONE PROPIONATE 50 MCG/ACT NA SUSP
2.0000 | Freq: Every day | NASAL | Status: DC
  Administered 2021-07-04 – 2021-07-05 (×2): 2 via NASAL
  Filled 2021-07-04: qty 16

## 2021-07-04 NOTE — Evaluation (Signed)
Physical Therapy Evaluation ?Patient Details ?Name: Samuel Mcconnell ?MRN: [id number] ?DOB: [birth year] ?Today's Date: 07/04/2021 ? ?History of Present Illness ? Demetrice Combes is a 88 y.o. male admitted 4/12 with cough and SOB.  PNA. PMH:  afib on eliquis, chronic diastolic CHF, COPD  ?Clinical Impression ? Pt admitted with above diagnosis. Pt was able to ambulate on unit with supervision/min guard assist with rW. Has RW at home.  HHPT recommended for safety eval and to work on pts balance as he does have some balance impairment with challenges.  Due to pts' fatigue with activity, recommend pt get a wheelchair for community mobility.  Continue acute PT.   Pt currently with functional limitations due to the deficits listed below (see PT Problem List). Pt will benefit from skilled PT to increase their independence and safety with mobility to allow discharge to the venue listed below.      ?   ? ?Recommendations for follow up therapy are one component of a multi-disciplinary discharge planning process, led by the attending physician.  Recommendations may be updated based on patient status, additional functional criteria and insurance authorization. ? ?Follow Up Recommendations Home health PT ? ?  ?Assistance Recommended at Discharge PRN  ?Patient can return home with the following ? Direct supervision/assist for medications management;Assistance with cooking/housework ? ?  ?Equipment Recommendations Wheelchair (18x16 lightweight wheelchair with foot rests, anti tippers and desk armrests);Wheelchair cushion (18x16 pressure relieving cushion)  ?Recommendations for Other Services ?    ?  ?Functional Status Assessment Patient has had a recent decline in their functional status and demonstrates the ability to make significant improvements in function in a reasonable and predictable amount of time.  ? ?  ?Precautions / Restrictions Precautions ?Precautions: Fall ?Restrictions ?Weight Bearing Restrictions: No  ? ?  ? ?Mobility ? Bed  Mobility ?Overal bed mobility: Independent ?  ?  ?  ?  ?  ?  ?  ?  ? ?Transfers ?Overall transfer level: Needs assistance ?Equipment used: Rolling walker (2 wheels) ?Transfers: Sit to/from Stand ?Sit to Stand: Supervision, Min guard ?  ?  ?  ?  ?  ?General transfer comment: Needed cues for hand placement only. ?  ? ?Ambulation/Gait ?Ambulation/Gait assistance: Min guard ?Gait Distance (Feet): 200 Feet ?Assistive device: Rolling walker (2 wheels) ?Gait Pattern/deviations: Step-through pattern, Decreased stride length ?  ?Gait velocity interpretation: <1.31 ft/sec, indicative of household ambulator ?  ?General Gait Details: Pt was able to ambulate with use of RW with overall good safety awareness. pt with weakness in left LE and at times incr weight shift bil feet however no LOB and able to self correct.  Did cue pt to make sure to bring RW back with him and then reach back with hands for the chair when sitting.  Pt's daughter does share the information with PT in regards that pt does not always lock brakes on his rollator nor does he keep it with him close.  Encouraged pt regarding rollator safety with transitions and pt does agree to slow down and try to heed safety instruction. ? ?Stairs ?  ?  ?  ?  ?  ? ?Wheelchair Mobility ?  ? ?Modified Rankin (Stroke Patients Only) ?  ? ?  ? ?Balance Overall balance assessment: Needs assistance ?Sitting-balance support: No upper extremity supported, Feet supported ?Sitting balance-Leahy Scale: Fair ?  ?  ?Standing balance support: Bilateral upper extremity supported, During functional activity ?Standing balance-Leahy Scale: Poor ?Standing balance comment: Pt relies on UE support  for balance. ?  ?  ?  ?  ?  ?  ?  ?  ?  ?  ?  ?   ? ? ? ?Pertinent Vitals/Pain Pain Assessment ?Pain Assessment: No/denies pain  ? ? ?Home Living Family/patient expects to be discharged to:: Private residence ?Living Arrangements: Spouse/significant other ?Available Help at Discharge: Family;Available  PRN/intermittently (wife can provide supervision) ?Type of Home: Independent living facility ?Home Access: Level entry ?  ?  ?  ?Home Layout: One level ?Home Equipment: Rollator (4 wheels);Shower seat - built in;Grab bars - tub/shower;Grab bars - toilet ?Additional Comments: pt goes to gym and gets on bike  ?  ?Prior Function Prior Level of Function : Independent/Modified Independent;Driving;History of Falls (last six months) (3 falls since 2020) ?  ?  ?  ?  ?  ?  ?  ?ADLs Comments: daughter double checks meds on occasion; daughter assists with grocery shopping and meal prep, wife has Alzheimers ?  ? ? ?Hand Dominance  ? Dominant Hand: Right ? ?  ?Extremity/Trunk Assessment  ? Upper Extremity Assessment ?Upper Extremity Assessment: Defer to OT evaluation ?  ? ?Lower Extremity Assessment ?Lower Extremity Assessment: Generalized weakness;LLE deficits/detail ?LLE Deficits / Details: grossly 3/5 ?  ? ?Cervical / Trunk Assessment ?Cervical / Trunk Assessment: Kyphotic (slightly kyphotic)  ?Communication  ? Communication: HOH  ?Cognition Arousal/Alertness: Awake/alert ?Behavior During Therapy: [facility] for tasks assessed/performed ?Overall Cognitive Status: Within Functional Limits for tasks assessed ?  ?  ?  ?  ?  ?  ?  ?  ?  ?  ?  ?  ?  ?  ?  ?  ?  ?  ?  ? ?  ?General Comments General comments (skin integrity, edema, etc.): VSS ? ?  ?Exercises    ? ?Assessment/Plan  ?  ?PT Assessment Patient needs continued PT services  ?PT Problem List Decreased activity tolerance;Decreased balance;Decreased mobility;Decreased knowledge of use of DME;Decreased safety awareness;Decreased knowledge of precautions;Cardiopulmonary status limiting activity ? ?   ?  ?PT Treatment Interventions DME instruction;Gait training;Functional mobility training;Therapeutic activities;Therapeutic exercise;Balance training;Patient/family education   ? ?PT Goals (Current goals can be found in the Care Plan section)  ?Acute Rehab PT Goals ?Patient Stated Goal:  to go home ?PT Goal Formulation: With patient ?Time For Goal Achievement: 07/18/21 ?Potential to Achieve Goals: Good ? ?  ?Frequency Min 3X/week ?  ? ? ?Co-evaluation   ?  ?  ?  ?  ? ? ?  ?AM-PAC PT "6 Clicks" Mobility  ?Outcome Measure Help needed turning from your back to your side while in a flat bed without using bedrails?: None ?Help needed moving from lying on your back to sitting on the side of a flat bed without using bedrails?: None ?Help needed moving to and from a bed to a chair (including a wheelchair)?: A Little ?Help needed standing up from a chair using your arms (e.g., wheelchair or bedside chair)?: A Little ?Help needed to walk in hospital room?: A Little ?Help needed climbing 3-5 steps with a railing? : A Lot ?6 Click Score: 19 ? ?  ?End of Session Equipment Utilized During Treatment: Gait belt ?Activity Tolerance: Patient limited by fatigue ?Patient left: in chair;with call bell/phone within reach;with chair alarm set;with family/visitor present ?Nurse Communication: Mobility status ?PT Visit Diagnosis: Unsteadiness on feet (R26.81);Muscle weakness (generalized) (M62.81) ?  ? ?Time: 0930-1000 ?PT Time Calculation (min) (ACUTE ONLY): 30 min ? ? ?Charges:   PT Evaluation ?$PT Eval Moderate Complexity:  1 Mod ?PT Treatments ?$Gait Training: 8-22 mins ?  ?   ? ? ?Beckett Maden M,PT ?Acute Rehab Services ?[phone] ?[phone] (pager)  ? ?Lakely Elmendorf F Krishang Reading ?07/04/2021, 12:54 PM ? ?

## 2021-07-04 NOTE — Progress Notes (Signed)
Mobility Specialist: Progress Note ? ? 07/04/21 1630  ?Mobility  ?Activity Ambulated with assistance in hallway  ?Level of Assistance Contact guard assist, steadying assist  ?Assistive Device Four wheel walker  ?Distance Ambulated (ft) 300 ft  ?Activity Response Tolerated well  ?$Mobility charge 1 Mobility  ? ?Received pt in bed having no complaints and agreeable to mobility. Asymptomatic throughout ambulation, returned back to bed w/ call bell in reach and all needs met. ? ?Samuel Mcconnell ?Mobility Specialist ?Mobility Specialist [place]: [phone] ?Mobility Specialist [place]: [phone] ? ?

## 2021-07-04 NOTE — Progress Notes (Signed)
Modified Barium Swallow Progress Note ? ?Patient Details  ?Name: Samuel Mcconnell ?MRN: [id number] ?Date of Birth: [birth year] ? ?Today's Date: 07/04/2021 ? ?Modified Barium Swallow completed.  Full report located under Chart Review in the Imaging Section. ? ?Brief recommendations include the following: ? ?Clinical Impression ? Pt presents with pharyngeal dysphagia characterized by reduced tongue base retraction, reduced anterior laryngeal movement, and a pharyngeal delay. He demonstrated vallecular residue, pyriform sinus residue, and intermittently incomplete epiglottic inversion. Penetration (PAS 5) was noted before the swallow and aspiration (PAS 7) was observed during the swallow with thin liquids secondary to the pharyngeal delay and incomplete epglottic inversion. Penetration (PAS 3) was also noted with nectar thick liquids via cup. A chin tuck posture improved laryngeal invasion with thin liquids to (PAS 3,5). A chin tuck posture with left head turn was effective in eliminating laryngeal invasion of thin liquids, and in reducing pharyngeal residue even when consecutive swallows of thin liquids were used via straw. Coughing was effective in expelling some penetrant, but not aspirated material. Moderate vallecular residue was noted and aspiration (PAS 7) was observed with thin liquids when pt used a liquid wash, but used a left head turn without the chin tuck. Considering the amount of residue with regular texture solids and the reports of the pt and family that intake rate is rapid and mastication inadequate, a dysphagia 3 diet with thin liquids is recommended at this time with observance of swallowing precautions. SLP will follow for dyspahgia treatment. ?  ?Swallow Evaluation Recommendations ? ?   ? ? SLP Diet Recommendations: Dysphagia 3 (Mech soft) solids;Thin liquid ? ? Liquid Administration via: Cup;No straw ? ? Medication Administration: Whole meds with puree ? ? Supervision: Patient able to self feed ? ?  Compensations: Slow rate;Small sips/bites;Follow solids with liquid;Chin tuck (with left head turn) ? ? Postural Changes: Remain semi-upright after after feeds/meals (Comment);Seated upright at 90 degrees ? ? Oral Care Recommendations: Oral care BID ? ?   ? ?Samuel Mcconnell, [place], Samuel Mcconnell ?Acute Rehabilitation Services ?Office number [phone] ?Pager [phone] ? ? ?Samuel Mcconnell ?07/04/2021,3:26 PM ?

## 2021-07-04 NOTE — Progress Notes (Signed)
?PROGRESS NOTE ? ? ? ?Samuel Mcconnell  KGU:[id number] DOB: 12-Jul-1927 DOA: 07/02/2021 ?PCP: Luetta Nutting, DO  ? ? ? ?Brief Narrative:  ?Samuel Mcconnell is a 88 y.o. male with medical history significant of afib on eliquis, chronic diastolic CHF, COPD ?Was seen at urgent care 10 days ago for cough found to have pneumonia he finished 9 days of antibiotics, report continue have cough and feeling more short of breath ?  ?Subjective: ? ?Cough and sob has much improved ? Still feel weak, needs two assist to get up to the bathroom ?Not able to sleep well last night ?Denies pain ?No fever, no wheezing ? ?Sodium is low ?Family at bedside  ? ?Assessment & Plan: ? Principal Problem: ?  Multifocal pneumonia ?Active Problems: ?  PAF (paroxysmal atrial fibrillation) ([place]) ?  Chronic obstructive pulmonary disease (HCC) ?  Essential hypertension ? ? ?Pneumonia failed outpatient treatment/COPD exacerbation ?Negative COVID screening ?MRSA screening negative ?sputum culture collected on 4/13, in process, so far no growth ?Procalcitonin level <0.1 ?Less  cough, less sob, wheezing has resolved on exam,  I did not hear rhonchi today, but lung sounds diminished on the RLL  ?taper steroid, nebulizer, antibiotics, add on Mucinex ?Wbc is up , likely due to steroids, taper, repeat cbc in am ?Follow-up CT chest in 6-8 weeks following treatment is recommended to ensure complete resolution. ? ??chocking, will get swallow eval ?  ?A-fib, paroxysmal ?Currently sinus rhythm, with first-degree AV block, chronic right bundle branch block ?Continue home medication amiodarone and Eliquis ?  ?Diastolic CHF ?Edema much improved after iv lasix on 4/12, will hold off lasix today,  monitor bp,  volume status and cr  ? ?Hyponatremia ?Does not appear volume overloaded ?From lasix? ?Hold lasix ?Encourage oral intake ?Repeat bmp in am ?  ?Hypertension ?Bp stable currently  ?  ?Hypothyroidism ?Continue synthroid  ?  ?FTT, PT eval recommended home health , case manage  consulted  ? ?Incidental findings on CT chest /ab/pel, needs outpatient follow up, reviewed with daughter at bedside : ? ?Left pleural calcified plaque, nonspecific but can be seen in the ?setting of asbestos exposure or previous trauma/injury ? ?Borderline aneurysm of the ascending thoracic aorta measures 4.0 ?cm. Recommend annual imaging followup by CTA or MRA. This ?recommendation follows 2010 ? ? 1.4 cm noncalcified stone versus mass in the dependent aspect of ?the nondilated gallbladder. Recommend elective outpatient ultrasound ?for further characterization. ?   ? ? ? ?I have Reviewed nursing notes, Vitals, pain scores, I/o's, Lab results and  imaging results since pt's last encounter, details please see discussion above  ?I ordered the following labs:  ?Unresulted Labs (From admission, onward)  ? ?  Start     Ordered  ? 07/05/21 0500  CBC with Differential/Platelet  Tomorrow morning,   R       ? 07/04/21 1554  ? 07/05/21 6283  Basic metabolic panel  Tomorrow morning,   R       ? 07/04/21 1554  ? ?  ?  ? ?  ? ? ? ?DVT prophylaxis: apixaban (ELIQUIS) tablet 2.5 mg Start: 07/02/21 2200 ?apixaban (ELIQUIS) tablet 2.5 mg  ? ?Code Status:   Code Status: DNR ? ?Family Communication: family at bedside  ?Disposition:  ? ?Status is: Inpatient ? ?Dispo: The patient is from: home , independent living      ?             Anticipated d/c is to: Home with home health           ?  Anticipated d/c date is: possible tomorrow, follow up on sodium level, volume status, continue taper steroid ? ?Antimicrobials:   ? ?Anti-infectives (From admission, onward)  ? ? Start     Dose/Rate Route Frequency Ordered Stop  ? 07/02/21 1700  ceFEPIme (MAXIPIME) 2 g in sodium chloride 0.9 % 100 mL IVPB       ? 2 g ?200 mL/hr over 30 Minutes Intravenous Every 12 hours 07/02/21 1641    ? ?  ? ? ? ? ? ?Objective: ?Vitals:  ? 07/04/21 0543 07/04/21 0726 07/04/21 0815 07/04/21 1416  ?BP: 126/74 (!) 145/81  136/73  ?Pulse: 84 60  83  ?Resp:  '16 18  17  '$ ?Temp: 98 ?F (36.7 ?C) 98.4 ?F (36.9 ?C)  98.2 ?F (36.8 ?C)  ?TempSrc: Oral Oral  Oral  ?SpO2: 93% 93% 97% 90%  ? ? ?Intake/Output Summary (Last 24 hours) at 07/04/2021 1558 ?Last data filed at 07/04/2021 1300 ?Gross per 24 hour  ?Intake 480 ml  ?Output 1850 ml  ?Net -1370 ml  ? ?There were no vitals filed for this visit. ? ?Examination: ? ?General exam: alert, awake, appear stronger, pleasant ?Respiratory system: improved aeration,wheezing and rhonchi has resolved, diminished at basis, more on the RLL, Respiratory effort normal, less intermittent cough ?Cardiovascular system:  RRR.  ?Gastrointestinal system: Abdomen is nondistended, soft and nontender.  Normal bowel sounds heard. ?Central nervous system: Alert and oriented. No focal neurological deficits. ?Extremities:  bilateral lower extremity pitting edema has much improved ?Skin: No rashes, lesions or ulcers ?Psychiatry: Judgement and insight appear normal. Mood & affect appropriate.  ? ? ? ?Data Reviewed: I have personally reviewed  labs and visualized  imaging studies since the last encounter and formulate the plan  ? ? ? ? ? ? ?Scheduled Meds: ? amiodarone  200 mg Oral Daily  ? apixaban  2.5 mg Oral BID  ? budesonide (PULMICORT) nebulizer solution  0.25 mg Nebulization BID  ? cholecalciferol  5,000 Units Oral Q lunch  ? fluticasone  2 spray Each Nare Daily  ? guaiFENesin  600 mg Oral BID  ? ipratropium-albuterol  3 mL Nebulization TID  ? levothyroxine  137 mcg Oral Q0600  ? melatonin  3 mg Oral QHS  ? methylPREDNISolone (SOLU-MEDROL) injection  40 mg Intravenous Daily  ? multivitamin with minerals  1 tablet Oral Daily  ? ?Continuous Infusions: ? ceFEPime (MAXIPIME) IV 2 g (07/04/21 0512)  ? ? ? LOS: 2 days  ? ? ? ?Florencia Reasons, MD PhD FACP ?[facility] ? ?Available via Epic secure chat 7am-7pm for nonurgent issues ?Please page for urgent issues ?To page the attending provider between 7A-7P or the covering provider during after hours 7P-7A, please  log into the web site [email or web address] and access using universal [place] password for that web site. If you do not have the password, please call the hospital operator. ? ? ? ?07/04/2021, 3:58 PM  ? ? ?

## 2021-07-04 NOTE — Progress Notes (Signed)
Speech Language Pathology Treatment: Dysphagia  ?Patient Details ?Name: Samuel Mcconnell ?MRN: [id number] ?DOB: 08/27/1927 ?Today's Date: 07/04/2021 ?Time: 5726-2035 ?SLP Time Calculation (min) (ACUTE ONLY): 23 min ? ?Assessment / Plan / Recommendation ?Clinical Impression ? Pt was seen for dysphagia treatment with his daughter present. Pt and his daughter were educated regarding the results of the modified barium swallow study, diet recommendations, and swallowing precautions. Video recording of the study was used to facilitate education and both parties verbalized understanding regarding all areas of education. Both parties were also educated regarding swallowing and respiration reciprocity and the pt's increased risk for aspiration during periods of dyspnea. Pt's daughter reported that discharge is planned for tomorrow. Literature was provided regarding dysphagia, his dysphagia 3 diet, swallowing precautions, and respiration/swallowing. All of the pt's and his daughter's questions were answered to their satisfaction and they verbalized agreement with home health SLP services for dysphagia treatment. Pt demonstrated coughing with thin liquids via straw when the chin tuck with left head turn was not used, but consistently tolerated thin liquids via cup when swallowing precautions were observed. SLP will continue to follow pt.   ?  ?HPI HPI: Pt is a 88 y.o. male who presented to the ED with cough and SOB. She was seen at urgent care 10 days prior to admission for cough and was found to have pneumonia; pt finished 9 days of antibiotics, but reported continued cough and worsening SOB. CT chest: Extensive multifocal pneumonia predominantly of the bilateral lower lobes. SLP consulted due to "Intermittent chocking" whean eating too quickly. PMH: afib on eliquis, chronic diastolic CHF, COPD ?  ?   ?SLP Plan ? Continue with current plan of care ? ?  ?  ?Recommendations for follow up therapy are one component of a  multi-disciplinary discharge planning process, led by the attending physician.  Recommendations may be updated based on patient status, additional functional criteria and insurance authorization. ?  ? ?Recommendations  ?Diet recommendations: Dysphagia 3 (mechanical soft);Thin liquid ?Liquids provided via: Cup;No straw ?Medication Administration: Whole meds with puree ?Supervision: Patient able to self feed;Intermittent supervision to cue for compensatory strategies ?Compensations: Slow rate;Small sips/bites;Follow solids with liquid;Chin tuck (with left head turn) ?Postural Changes and/or Swallow Maneuvers: Chin tuck;Head turn left during swallow;Upright 30-60 min after meal;Seated upright 90 degrees  ?   ?    ?   ? ? ? ? Oral Care Recommendations: Oral care BID ?Follow Up Recommendations: Home health SLP ?Assistance recommended at discharge: Intermittent Supervision/Assistance ?SLP Visit Diagnosis: Dysphagia, pharyngeal phase (R13.13) ?Plan: Continue with current plan of care ? ? ? ? ?  ?  ?Kadyn Chovan I. Hardin Negus, [place], CCC-SLP ?Acute Rehabilitation Services ?Office number [phone] ?Pager [phone] ? ? ?Horton Marshall ? ?07/04/2021, 5:21 PM ? ? ? ? ?

## 2021-07-04 NOTE — Evaluation (Signed)
Clinical/Bedside Swallow Evaluation ?Patient Details  ?Name: Samuel Mcconnell ?MRN: [id number] ?Date of Birth: 08-19-1927 ? ?Today's Date: 07/04/2021 ?Time: SLP Start Time (ACUTE ONLY): 3295 SLP Stop Time (ACUTE ONLY): 1884 ?SLP Time Calculation (min) (ACUTE ONLY): 14 min ? ?Past Medical History:  ?Past Medical History:  ?Diagnosis Date  ? Atrial fibrillation ([place])   ? radiofrequency ablation  ? Emphysema   ? Hypertension   ? Thyroid disease   ? ?Past Surgical History:  ?Past Surgical History:  ?Procedure Laterality Date  ? CARDIAC SURGERY    ? CARDIOVERSION N/A 10/24/2019  ? Procedure: CARDIOVERSION;  Surgeon: Sanda Klein, MD;  Location: [facility];  Service: Cardiovascular;  Laterality: N/A;  ? CARDIOVERSION N/A 10/22/2020  ? Procedure: CARDIOVERSION;  Surgeon: Thayer Headings, MD;  Location: [facility];  Service: Cardiovascular;  Laterality: N/A;  ? [place]  ? KNEE ARTHROSCOPY Left 07/2000  ? LOOP RECORDER IMPLANT  01/16/2010  ? [employer] Reveal XT (Dr. Norlene Duel)   ? RADIOFREQUENCY ABLATION  06/13/2009  ? SHOULDER ARTHROSCOPY WITH ROTATOR CUFF REPAIR Left 04/1996  ? TIBIAL TUBERCLERPLASTY  ~ 10/2010  ? TRANSTHORACIC ECHOCARDIOGRAM  11/19/2011  ? EF=>55%; mild MR, mild mitral annular calcif; mild TR, normal RSVP; AV mildly sclerotic, mild AV regurg; trace pulm valve regurg   ? ?HPI:  ?Pt is a 88 y.o. male who presented to the ED with cough and SOB. She was seen at urgent care 10 days prior to admission for cough and was found to have pneumonia; pt finished 9 days of antibiotics, but reported continued cough and worsening SOB. CT chest: Extensive multifocal pneumonia predominantly of the bilateral lower lobes. SLP consulted due to "Intermittent chocking" whean eating too quickly. PMH: afib on eliquis, chronic diastolic CHF, COPD  ?  ?Assessment / Plan / Recommendation  ?Clinical Impression ? Pt was seen for bedside swallow evaluation with  his wife and daughter present. Pt's family reported that the pt eats  too quickly and coughs inconsistently during meals. Pt reported that he has been most symptomatic when he is SOB and he admitted that he does eat too quickly at times. Oral mechanism exam was [facility] and he presented with full dentures. He demonstrated inconsistent signs of aspiration with thin liquids and regular/mixed consistency boluses, but no other symptoms of oropharyngeal dysphagia were noted. A modified barium swallow study is recommended to further assess swallow function. It is scheduled for today at 1400; his current diet will be continued until it is completed. ?SLP Visit Diagnosis: Dysphagia, unspecified (R13.10) ?   ?Aspiration Risk ? Mild aspiration risk  ?  ?Diet Recommendation Regular;Thin liquid (continue current diet until study is completed)  ? ?Liquid Administration via: Cup ?Medication Administration: Whole meds with puree ?Supervision: Patient able to self feed;Intermittent supervision to cue for compensatory strategies ?Postural Changes: Seated upright at 90 degrees  ?  ?Other  Recommendations Oral Care Recommendations: Oral care BID   ? ?Recommendations for follow up therapy are one component of a multi-disciplinary discharge planning process, led by the attending physician.  Recommendations may be updated based on patient status, additional functional criteria and insurance authorization. ? ?Follow up Recommendations Home health SLP  ? ? ?  ?Assistance Recommended at Discharge    ?Functional Status Assessment Patient has not had a recent decline in their functional status  ?Frequency and Duration min 2x/week  ?2 weeks ?  ?   ? ?Prognosis Prognosis for Safe Diet Advancement: Good ?Barriers to Reach Goals: Time post onset  ? ?  ? ?  Swallow Study   ?General Date of Onset: 07/03/21 ?HPI: Pt is a 88 y.o. male who presented to the ED with cough and SOB. She was seen at urgent care 10 days prior to admission for cough and was found to have pneumonia; pt finished 9 days of antibiotics, but reported  continued cough and worsening SOB. CT chest: Extensive multifocal pneumonia predominantly of the bilateral lower lobes. SLP consulted due to "Intermittent chocking" whean eating too quickly. PMH: afib on eliquis, chronic diastolic CHF, COPD ?Type of Study: Bedside Swallow Evaluation ?Previous Swallow Assessment: none ?Diet Prior to this Study: Regular;Thin liquids ?Temperature Spikes Noted: No ?Respiratory Status: Room air ?History of Recent Intubation: No ?Behavior/Cognition: Alert;Cooperative;Pleasant mood ?Oral Cavity Assessment: Within Functional Limits ?Oral Care Completed by SLP: No ?Oral Cavity - Dentition: Dentures, top;Dentures, bottom ?Vision: Functional for self-feeding ?Patient Positioning: Upright in chair;Postural control adequate for testing ?Baseline Vocal Quality: Normal ?Volitional Cough: Strong ?Volitional Swallow: Able to elicit  ?  ?Oral/Motor/Sensory Function Overall Oral Motor/Sensory Function: Within functional limits   ?Ice Chips Ice chips: Not tested   ?Thin Liquid Thin Liquid: Impaired ?Presentation: Straw ?Pharyngeal  Phase Impairments: Cough - Delayed  ?  ?Nectar Thick Nectar Thick Liquid: Not tested   ?Honey Thick Honey Thick Liquid: Not tested   ?Puree Puree: Within functional limits ?Presentation: Spoon   ?Solid ? ? ?  Solid: Impaired ?Presentation: Self Fed ?Pharyngeal Phase Impairments: Cough - Immediate;Cough - Delayed  ? ?  ?Tiannah Greenly I. Hardin Negus, [place], CCC-SLP ?Acute Rehabilitation Services ?Office number [phone] ?Pager [phone] ? ?Samuel Mcconnell ?07/04/2021,1:01 PM ? ? ? ? ? ?

## 2021-07-05 DIAGNOSIS — J189 Pneumonia, unspecified organism: Secondary | ICD-10-CM | POA: Diagnosis not present

## 2021-07-05 LAB — CBC WITH DIFFERENTIAL/PLATELET
Abs Immature Granulocytes: 0.22 10*3/uL — ABNORMAL HIGH (ref 0.00–0.07)
Basophils Absolute: 0 10*3/uL (ref 0.0–0.1)
Basophils Relative: 0 %
Eosinophils Absolute: 0 10*3/uL (ref 0.0–0.5)
Eosinophils Relative: 0 %
HCT: 32.7 % — ABNORMAL LOW (ref 39.0–52.0)
Hemoglobin: 10.7 g/dL — ABNORMAL LOW (ref 13.0–17.0)
Immature Granulocytes: 1 %
Lymphocytes Relative: 3 %
Lymphs Abs: 0.6 10*3/uL — ABNORMAL LOW (ref 0.7–4.0)
MCH: 29.1 pg (ref 26.0–34.0)
MCHC: 32.7 g/dL (ref 30.0–36.0)
MCV: 88.9 fL (ref 80.0–100.0)
Monocytes Absolute: 0.6 10*3/uL (ref 0.1–1.0)
Monocytes Relative: 2 %
Neutro Abs: 23.4 10*3/uL — ABNORMAL HIGH (ref 1.7–7.7)
Neutrophils Relative %: 94 %
Platelets: 498 10*3/uL — ABNORMAL HIGH (ref 150–400)
RBC: 3.68 MIL/uL — ABNORMAL LOW (ref 4.22–5.81)
RDW: 13.5 % (ref 11.5–15.5)
WBC: 24.9 10*3/uL — ABNORMAL HIGH (ref 4.0–10.5)
nRBC: 0 % (ref 0.0–0.2)

## 2021-07-05 LAB — BASIC METABOLIC PANEL
Anion gap: 7 (ref 5–15)
BUN: 34 mg/dL — ABNORMAL HIGH (ref 8–23)
CO2: 25 mmol/L (ref 22–32)
Calcium: 8.8 mg/dL — ABNORMAL LOW (ref 8.9–10.3)
Chloride: 103 mmol/L (ref 98–111)
Creatinine, Ser: 1.41 mg/dL — ABNORMAL HIGH (ref 0.61–1.24)
GFR, Estimated: 47 mL/min — ABNORMAL LOW (ref >60)
Glucose, Bld: 140 mg/dL — ABNORMAL HIGH (ref 70–99)
Potassium: 4.4 mmol/L (ref 3.5–5.1)
Sodium: 135 mmol/L (ref 135–145)

## 2021-07-05 MED ORDER — ADULT MULTIVITAMIN W/MINERALS CH: 1.0000 | ORAL_TABLET | Freq: Every day | ORAL | Status: DC

## 2021-07-05 MED ORDER — PREDNISONE 10 MG PO TABS: ORAL_TABLET | ORAL | 0 refills | Status: DC

## 2021-07-05 MED ORDER — CEFDINIR 300 MG PO CAPS: 300.0000 mg | ORAL_CAPSULE | Freq: Two times a day (BID) | ORAL | Status: DC

## 2021-07-05 MED ORDER — CEFDINIR 300 MG PO CAPS
300.0000 mg | ORAL_CAPSULE | Freq: Every day | ORAL | 0 refills | Status: AC
Start: 2021-07-06 — End: 2021-07-09

## 2021-07-05 MED ORDER — CEFDINIR 300 MG PO CAPS
300.0000 mg | ORAL_CAPSULE | Freq: Every day | ORAL | Status: DC
  Filled 2021-07-05: qty 1

## 2021-07-05 NOTE — Discharge Summary (Signed)
? ?Discharge Summary ? ?Samuel Mcconnell SNK:[id number] DOB: 1927/06/24 ? ?PCP: Luetta Nutting, DO ? ?Admit date: 07/02/2021 ?Discharge date: 07/05/2021 ? ?Time spent: 22mns, more than 50% time spent on coordination of care.  ? ?Recommendations for Outpatient Follow-up:  ?F/u with PCP within a week  for hospital discharge follow up, repeat cbc/bmp at follow up, Follow-up CT chest in 6-8 weeks  is recommended to ensure complete resolution of lung infiltrates  ?Home health PT/speech ?Wheelchair ? ?Pending labs to follow up: ? ?Final sputum culture ? ?Discharge Diagnoses:  ?Active Hospital Problems  ? Diagnosis Date Noted  ? Multifocal pneumonia 07/02/2021  ? Chronic obstructive pulmonary disease ([place] 09/14/2017  ? Essential hypertension 09/14/2017  ? PAF (paroxysmal atrial fibrillation) ([place] 03/15/2013  ?  ?Resolved Hospital Problems  ?No resolved problems to display.  ? ? ?Discharge Condition: stable ? ?Diet recommendation:  ? ?Diet recommendations: Dysphagia 3 (mechanical soft);Thin liquid ?Liquids provided via: Cup;No straw ?Medication Administration: Whole meds with puree ?Supervision: Patient able to self feed;Intermittent supervision to cue for compensatory strategies ?Compensations: Slow rate;Small sips/bites;Follow solids with liquid;Chin tuck (with left head turn) ?Postural Changes and/or Swallow Maneuvers: Chin tuck;Head turn left during swallow;Upright 30-60 min after meal;Seated upright 90 degrees   ?     ?    ?     ?  ?  ?  ?  Oral Care Recommendations: Oral care BID ?Follow Up Recommendations: Home health SLP ?Assistance recommended at discharge: Intermittent Supervision/Assistance ?SLP Visit Diagnosis: Dysphagia, pharyngeal phase (R13.13)  ? ? ? ? ?History of present illness:  ?Chief Complaint: cough, sob,  ?  ?HPI: Samuel Mcconnell a 88y.o. male with medical history significant of afib on eliquis, chronic diastolic CHF, COPD ?Was seen at urgent care 10 days ago for cough found to have pneumonia he  finished 9 days of antibiotics, report continue have cough and feeling more short of breath ?  ?ED Course:  ?Data reviewed: ?Blood pressure (!) 120/51, pulse 65, temperature 98.5 ?F (36.9 ?C), temperature source Oral, resp. rate (!) 22, SpO2 91 % on room air ?  ?CTA chest no PE, showed multifocal pneumonia ?CT ab/pel without acute findings ?  ?WBC 10, sodium 132, creatinine 1.34 (which is close to baseline),  BNP 131, troponin 11-10 ?  ?Per EDP lung exam is consistent with COPD which is tight and wheezing , he received Solu-Medrol and nebulizer treatment, also received cefepime  for pneumonia that failed outpatient antibiotic therapy ?  ?COVID screening ordered not collected yet ?  ?Hospitalist called to admit the patient ? ?Hospital Course:  ?Principal Problem: ?  Multifocal pneumonia ?Active Problems: ?  PAF (paroxysmal atrial fibrillation) ([facility] ?  Chronic obstructive pulmonary disease (HCC) ?  Essential hypertension ? ? ?Pneumonia failed outpatient treatment/COPD exacerbation ?Negative COVID screening ?MRSA screening negative ?sputum culture collected on 4/13, in process, so far no growth ?Procalcitonin level <0.1 ?Seen by speech does has risk of aspiration, diet modification recommendation as above mentioned, continue aspiration precaution  ?Treated with steroid, nebulizer, cefepime, Mucinex, clinically significantly improved, patient desires to go home, will discharge on prednisone taper and ominicef for total of 3 days  ?Wbc is up , likely due to steroids, taper, repeat cbc at hospital discharge follow up ?Follow-up CT chest in 6-8 weeks following treatment is recommended to ensure complete resolution. ?  ?A-fib, paroxysmal ?Currently sinus rhythm, with first-degree AV block, chronic right bundle branch block ?Continue home medication amiodarone and Eliquis ?  ?Diastolic CHF ?Presents with bilateral  lower extremity pitting edema,  ?Edema much improved after iv lasix on 4/12,  ? volume status and cr stable at  discharge ?On oral  lasix  prn at home, resume ?  ?Hyponatremia ?Does not appear volume overloaded ?From lasix? Vs lab error ?Encourage oral intake ?Sodium normalized ?Repeat bmp at hospital discharge follow up ?  ?Hypertension ?Bp stable on current regimen  ?  ?Hypothyroidism ?Continue synthroid  ?  ?FTT, PT eval recommended home health and wheelchair, case manage consulted  ?  ?Incidental findings on CT chest /ab/pel, needs outpatient follow up, reviewed with daughter at bedside : ?  ?Left pleural calcified plaque, nonspecific but can be seen in the ?setting of asbestos exposure or previous trauma/injury ?  ?Borderline aneurysm of the ascending thoracic aorta measures 4.0 ?cm. Recommend annual imaging followup by CTA or MRA. This ?recommendation follows 2010 ?  ? 1.4 cm noncalcified stone versus mass in the dependent aspect of ?the nondilated gallbladder. Recommend elective outpatient ultrasound ?for further characterization. ?   ?  ?Discharge Exam: ?BP (!) 149/74 (BP Location: Right Arm)   Pulse 70   Temp 97.7 ?F (36.5 ?C) (Oral)   Resp 16   SpO2 90%  ? ?General: NAD, pleasant  ?Cardiovascular: RRR ?Respiratory: normal respiratory effort, improved aeration ? ? ? ?Discharge Instructions   ? ? Diet general   Complete by: As directed ?  ? Dysphagia 3 diet , soft with thin liquid ?Meds whole with puree, no straws, chin tuck with left head turn for solids and liquids ?Continue aspiration precaution  ? Increase activity slowly   Complete by: As directed ?  ? ?  ? ?Allergies as of 07/05/2021   ? ?   Reactions  ? Clindamycin/lincomycin Other (See Comments)  ? Patient "felt weird"  ? Dabigatran Etexilate Mesylate Other (See Comments)  ? "felt weird" (name brand Pradaxa)  ? Doxycycline Other (See Comments)  ? Dyspepsia  ? ?  ? ?  ?Medication List  ?  ? ?STOP taking these medications   ? ?amoxicillin-clavulanate 875-125 MG tablet ?Commonly known as: AUGMENTIN ?  ?azithromycin 250 MG tablet ?Commonly known as: ZITHROMAX ?   ?Osteo Bi-Flex One Per Day Tabs ?  ?promethazine-dextromethorphan 6.25-15 MG/5ML syrup ?Commonly known as: PROMETHAZINE-DM ?  ? ?  ? ?TAKE these medications   ? ?acetaminophen 500 MG tablet ?Commonly known as: TYLENOL ?Take 500 mg by mouth daily as needed for moderate pain. ?  ?albuterol 108 (90 Base) MCG/ACT inhaler ?Commonly known as: VENTOLIN HFA ?Inhale 2 puffs into the lungs every 6 (six) hours as needed for wheezing or shortness of breath. ?  ?amiodarone 200 MG tablet ?Commonly known as: PACERONE ?TAKE 1 TABLET EVERY DAY ?  ?benzonatate 200 MG capsule ?Commonly known as: TESSALON ?Take 200 mg by mouth 3 (three) times daily. ?  ?cefdinir 300 MG capsule ?Commonly known as: OMNICEF ?Take 1 capsule (300 mg total) by mouth daily for 3 days. ?Start taking on: July 06, 2021 ?  ?Delsym 30 MG/5ML liquid ?Generic drug: dextromethorphan ?Take 15-30 mg by mouth every 12 (twelve) hours as needed for cough. ?  ?diphenhydramine-acetaminophen 25-500 MG Tabs tablet ?Commonly known as: TYLENOL PM ?Take 1 tablet by mouth at bedtime as needed (for sleeplessness). ?  ?Eliquis 2.5 MG Tabs tablet ?Generic drug: apixaban ?TAKE 1 TABLET TWICE DAILY (DOSE CHANGE) ?What changed: See the new instructions. ?  ?fluticasone 50 MCG/ACT nasal spray ?Commonly known as: FLONASE ?Place 2 sprays into both nostrils daily. ?What changed:  ?  when to take this ?reasons to take this ?  ?fluticasone-salmeterol 250-50 MCG/ACT Aepb ?Commonly known as: ADVAIR ?INHALE 1 PUFF TWICE DAILY INTO THE LUNGS (NEED MD APPOINTMENT FOR REFILLS) ?What changed: See the new instructions. ?  ?furosemide 20 MG tablet ?Commonly known as: LASIX ?TAKE 1 TABLET EVERY DAY AS NEEDED FOR  EDEMA ?  ?ITCHY EYE DROPS OP ?Place 1 drop into both eyes daily as needed (itchy eyes). ?  ?levothyroxine 137 MCG tablet ?Commonly known as: SYNTHROID ?TAKE 1 TABLET (137 MCG TOTAL) BY MOUTH DAILY BEFORE BREAKFAST. ?  ?losartan 25 MG tablet ?Commonly known as: COZAAR ?Take 1 tablet (25 mg  total) by mouth at bedtime. ?What changed: Another medication with the same name was removed. Continue taking this medication, and follow the directions you see here. ?  ?multivitamin capsule ?Take 1 capsule by mo

## 2021-07-05 NOTE — Progress Notes (Signed)
Mobility Specialist: Progress Note ? ? 07/05/21 1017  ?Mobility  ?Activity Ambulated with assistance in hallway  ?Level of Assistance Minimal assist, patient does 75% or more  ?Assistive Device Four wheel walker  ?Distance Ambulated (ft) 550 ft ?(225'x2)  ?Activity Response Tolerated well  ?$Mobility charge 1 Mobility  ? ?Pt received in the chair and agreeable to ambulation. Stopped x1 for seated break d/t coughing spell, otherwise asymptomatic throughout. Pt back to recliner after session with family present in the room. Hopeful for discharge soon.  ? ?Harrell Gave Tahir Blank ?Mobility Specialist ?Mobility Specialist [place]: [phone] ?Mobility Specialist [place]: [phone] ? ?

## 2021-07-05 NOTE — Progress Notes (Signed)
Pt has DC order. AVS was given and explain to pt, daughter was included in the teaching as she is involved in the care. Wheelchair will be delivered at home per CM. ?

## 2021-07-05 NOTE — TOC Initial Note (Signed)
Transition of Care (TOC) - Initial/Assessment Note  ? ? ?Patient Details  ?Name: Samuel Mcconnell ?MRN: [id number] ?Date of Birth: September 02, 1927 ? ?Transition of Care (TOC) CM/SW Contact:    ?Brentt Fread G., RN ?Phone Number: ?07/05/2021, 9:17 AM ? ?Clinical Narrative:   ? ?88 y.o. male with medical history significant of afib on eliquis, chronic diastolic CHF, COPD ?Was seen at urgent care 10 days ago for cough found to have pneumonia he finished 9 days of antibiotics, report continue have cough and feeling more short of breath ? ?RNCM received HH and DME orders.  RNCM discussed with patient's daughter, Chauncey [place], choices for services,  Patient's daughter with no preference so Jasmine at [employer] contacted with DME order for wheelchair and accepted-to be delivered to patient's room prior to discharge.  Georgina Snell at [facility] contacted for [facility] services-PT and SLP and accepted.  Patient's daughter, Hassan Rowan, is contact-[phone]. ?            ?Expected Discharge Plan: Home/Self Care ?Barriers to Discharge: No Barriers Identified ? ?Patient Goals and CMS Choice ?Patient states their goals for this hospitalization and ongoing recovery are:: return to home ?CMS Medicare.gov Compare Post Acute Care list provided to:: Patient Represenative (must comment) (daughter) ?Choice offered to / list presented to : [facility] POA / Guardian ?Expected Discharge Plan and Services ?Expected Discharge Plan: Home/Self Care ?  ?Discharge Planning Services: CM Consult ?Post Acute Care Choice: Durable Medical Equipment, Home Health ?Living arrangements for the past 2 months: Apartment ?Expected Discharge Date: 07/05/21               ?DME Arranged: Wheelchair manual ?DME Agency: [facility] ?Date DME Agency Contacted: 07/05/21 ?Time DME Agency Contacted: [phone] ?Representative spoke with at DME Agency: Delana Meyer ?HH Arranged: PT (SLP) ?[place] Agency: [place] ?Date HH Agency Contacted: 07/05/21 ?Time [place]: [phone] ?Representative spoke with at [place]: Georgina Snell ?Prior Living Arrangements/Services ?Living arrangements for the past 2 months: Apartment ?Lives with:: Spouse ?Patient language and need for interpreter reviewed:: Yes ?Do you feel safe going back to the place where you live?: Yes      ?Need for Family Participation in Patient Care: No (Comment) ?Care giver support system in place?: Yes (comment) ?Current home services: DME, Homehealth aide ?  ?Activities of Daily Living ?Home Assistive Devices/Equipment: None ?ADL Screening (condition at time of admission) ?Patient's cognitive ability adequate to safely complete daily activities?: Yes ?Is the patient deaf or have difficulty hearing?: Yes ?Does the patient have difficulty seeing, even when wearing glasses/contacts?: No ?Does the patient have difficulty concentrating, remembering, or making decisions?: No ?Patient able to express need for assistance with ADLs?: No ?Does the patient have difficulty dressing or bathing?: No ?Independently performs ADLs?: Yes (appropriate for developmental age) ?Does the patient have difficulty walking or climbing stairs?: Yes ?Weakness of Legs: Both ?Weakness of Arms/Hands: None ? ?Permission Sought/Granted ?  ?  ?   ?   ?   ?   ?Emotional Assessment ?Appearance:: Appears stated age ?Attitude/Demeanor/Rapport: Engaged ?Affect (typically observed): Accepting ?Orientation: : Oriented to Self, Oriented to Place, Oriented to  Time, Oriented to Situation ?  ?Psych Involvement: No (comment) ? ?Admission diagnosis:  Multifocal pneumonia [J18.9] ?Chronic obstructive pulmonary disease, unspecified COPD type ([place]) [J44.9] ?Community acquired pneumonia, unspecified laterality [J18.9] ?Patient Active Problem List  ? Diagnosis Date Noted  ? Multifocal pneumonia 07/02/2021  ? Scalp laceration 02/18/2021  ? Dry nares 02/18/2021  ? Restless legs 11/18/2020  ? Persistent atrial fibrillation ([place])  10/14/2020  ? Secondary hypercoagulable state ([place]) 10/14/2020  ? Well adult exam 05/07/2020   ? Gait instability 11/26/2019  ? Hypothyroid 11/26/2019  ? Atypical atrial flutter ([place])   ? Fall from slip, trip, or stumble, initial encounter 09/08/2019  ? Bilateral lower extremity edema 09/08/2019  ? 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 Chronic obstructive pulmonary disease ([place]) 09/14/2017  ? Essential hypertension 09/14/2017  ? Myringotomy tube status 02/25/2017  ? LVH (left ventricular hypertrophy) 03/28/2016  ? Gallbladder calculus without cholecystitis 07/22/2014  ? Fatty liver 07/22/2014  ? PAF (paroxysmal atrial fibrillation) ([place]) 03/15/2013  ? CKD (chronic kidney disease) stage 3, GFR 30-59 ml/min (HCC) 02/21/2013  ? Allergic rhinitis 06/24/2012  ? ?PCP:  Luetta Nutting, DO ?Pharmacy:   ?[facility] ?[place] [address] ?Phone: [phone] Fax: [phone] ? ?[id number]?Phone: [phone]: [phone]? ?Social Determinants of Health (SDOH) Interventions ?  ?Readmission Risk Interventions ?   ? View : No data to display.  ?  ?  ?  ? ? ? ?

## 2021-07-06 LAB — CULTURE, RESPIRATORY W GRAM STAIN: Culture: NORMAL

## 2021-07-07 LAB — EXPECTORATED SPUTUM ASSESSMENT W GRAM STAIN, RFLX TO RESP C

## 2021-07-11 ENCOUNTER — Encounter: Payer: Self-pay | Admitting: Family Medicine

## 2021-07-11 ENCOUNTER — Ambulatory Visit (INDEPENDENT_AMBULATORY_CARE_PROVIDER_SITE_OTHER): Payer: Medicare HMO | Admitting: Family Medicine

## 2021-07-11 ENCOUNTER — Ambulatory Visit (INDEPENDENT_AMBULATORY_CARE_PROVIDER_SITE_OTHER): Payer: Medicare HMO

## 2021-07-11 VITALS — BP 106/42 | HR 62 | Ht 72.0 in | Wt 181.0 lb

## 2021-07-11 DIAGNOSIS — J189 Pneumonia, unspecified organism: Secondary | ICD-10-CM

## 2021-07-11 MED ORDER — AMBULATORY NON FORMULARY MEDICATION: 0 refills | Status: AC

## 2021-07-11 MED ORDER — AZITHROMYCIN 500 MG PO TABS: 500.0000 mg | ORAL_TABLET | Freq: Every day | ORAL | 0 refills | Status: AC

## 2021-07-11 MED ORDER — CEFDINIR 300 MG PO CAPS: 300.0000 mg | ORAL_CAPSULE | Freq: Two times a day (BID) | ORAL | 0 refills | Status: AC

## 2021-07-12 LAB — CBC WITH DIFFERENTIAL/PLATELET
Absolute Monocytes: 1263 cells/uL — ABNORMAL HIGH (ref 200–950)
Basophils Absolute: 11 cells/uL (ref 0–200)
Basophils Relative: 0.1 %
Eosinophils Absolute: 171 cells/uL (ref 15–500)
Eosinophils Relative: 1.6 %
HCT: 38.1 % — ABNORMAL LOW (ref 38.5–50.0)
Hemoglobin: 11.8 g/dL — ABNORMAL LOW (ref 13.2–17.1)
Lymphs Abs: 1873 cells/uL (ref 850–3900)
MCH: 28.7 pg (ref 27.0–33.0)
MCHC: 31 g/dL — ABNORMAL LOW (ref 32.0–36.0)
MCV: 92.7 fL (ref 80.0–100.0)
MPV: 9.7 fL (ref 7.5–12.5)
Monocytes Relative: 11.8 %
Neutro Abs: 7383 cells/uL (ref 1500–7800)
Neutrophils Relative %: 69 %
Platelets: 444 10*3/uL — ABNORMAL HIGH (ref 140–400)
RBC: 4.11 10*6/uL — ABNORMAL LOW (ref 4.20–5.80)
RDW: 13.4 % (ref 11.0–15.0)
Total Lymphocyte: 17.5 %
WBC: 10.7 10*3/uL (ref 3.8–10.8)

## 2021-07-12 LAB — COMPLETE METABOLIC PANEL WITH GFR
AG Ratio: 1.5 (calc) (ref 1.0–2.5)
ALT: 41 U/L (ref 9–46)
AST: 25 U/L (ref 10–35)
Albumin: 3.4 g/dL — ABNORMAL LOW (ref 3.6–5.1)
Alkaline phosphatase (APISO): 51 U/L (ref 35–144)
BUN/Creatinine Ratio: 19 (calc) (ref 6–22)
BUN: 28 mg/dL — ABNORMAL HIGH (ref 7–25)
CO2: 31 mmol/L (ref 20–32)
Calcium: 8.5 mg/dL — ABNORMAL LOW (ref 8.6–10.3)
Chloride: 100 mmol/L (ref 98–110)
Creat: 1.44 mg/dL — ABNORMAL HIGH (ref 0.70–1.22)
Globulin: 2.3 g/dL (calc) (ref 1.9–3.7)
Glucose, Bld: 87 mg/dL (ref 65–139)
Potassium: 5.1 mmol/L (ref 3.5–5.3)
Sodium: 135 mmol/L (ref 135–146)
Total Bilirubin: 0.6 mg/dL (ref 0.2–1.2)
Total Protein: 5.7 g/dL — ABNORMAL LOW (ref 6.1–8.1)
eGFR: 46 mL/min/{1.73_m2} — ABNORMAL LOW (ref >=60)

## 2021-07-13 NOTE — Assessment & Plan Note (Signed)
Concern for worsening pneumonia in LLL.  Will go ahead and repeat CXR today.  Recheck CMP/CBC today.  ? ? ?Update:  CXR with worsening LLL pneumonia.  Initially was going to treat with levaquin due to recent hospitalization,  however there is significant interaction with his amiodarone.  Adding cefdinir and azithromycin.  If worsening wiill likely need readmission for IV Abx.   ?

## 2021-07-13 NOTE — Progress Notes (Signed)
?Samuel Mcconnell - 88 y.o. male MRN [id number]  Date of birth: 08/29/1927 ? ?Subjective ?Chief Complaint  ?Patient presents with  ? Hospitalization Follow-up  ? ? ?HPI ?Samuel Mcconnell is a 88 y.o. male here today for hospital follow up.  He was hospitalized from 4/12-4/15/23 due to multifocal pneumonia.  Treated with IV antibiotics and steroids.  Transitioned to PO omnicef and prednisone prior to discharge.  Concern about possible aspiration contributing as well.  SLP seen in hospital and is working with him through home health.  Recommend whole meds in puree, regular liquids.  Reports that he continues to have poor energy levels.  Hasn't quite bounced back like he has in the past.  He denies increased shortness of breath, fever or chills.  ? ?ROS:  A comprehensive ROS was completed and negative except as noted per HPI ? ?Allergies  ?Allergen Reactions  ? Clindamycin/Lincomycin Other (See Comments)  ?  Patient "felt weird" ?  ? Dabigatran Etexilate Mesylate Other (See Comments)  ?  "felt weird" (name brand Pradaxa)  ? Doxycycline Other (See Comments)  ?  Dyspepsia  ? ? ?Past Medical History:  ?Diagnosis Date  ? Atrial fibrillation ([place])   ? radiofrequency ablation  ? Emphysema   ? Hypertension   ? Thyroid disease   ? ? ?Past Surgical History:  ?Procedure Laterality Date  ? CARDIAC SURGERY    ? CARDIOVERSION N/A 10/24/2019  ? Procedure: CARDIOVERSION;  Surgeon: Sanda Klein, MD;  Location: [facility];  Service: Cardiovascular;  Laterality: N/A;  ? CARDIOVERSION N/A 10/22/2020  ? Procedure: CARDIOVERSION;  Surgeon: Thayer Headings, MD;  Location: [facility];  Service: Cardiovascular;  Laterality: N/A;  ? [place]  ? KNEE ARTHROSCOPY Left 07/2000  ? LOOP RECORDER IMPLANT  01/16/2010  ? Medtronic Reveal XT (Dr. Norlene Duel)   ? RADIOFREQUENCY ABLATION  06/13/2009  ? SHOULDER ARTHROSCOPY WITH ROTATOR CUFF REPAIR Left 04/1996  ? TIBIAL TUBERCLERPLASTY  ~ 10/2010  ? TRANSTHORACIC ECHOCARDIOGRAM  11/19/2011  ? EF=>55%;  mild MR, mild mitral annular calcif; mild TR, normal RSVP; AV mildly sclerotic, mild AV regurg; trace pulm valve regurg   ? ? ?Social History  ? ?Socioeconomic History  ? Marital status: Married  ?  Spouse name: Barnett Applebaum  ? Number of children: 2  ? Years of education: 66  ? Highest education level: Bachelor's degree (e.g., BA, AB, BS)  ?Occupational History  ? Occupation: Retired  ?Tobacco Use  ? Smoking status: Former  ?  Years: 46.00  ?  Types: Cigarettes  ?  Quit date: 03/14/2002  ?  Years since quitting: 19.3  ? Smokeless tobacco: Never  ? Tobacco comments:  ?  Former smoker 01/23/2021  ?Vaping Use  ? Vaping Use: Never used  ?Substance and Sexual Activity  ? Alcohol use: No  ? Drug use: No  ? Sexual activity: Not Currently  ?  Partners: Female  ?Other Topics Concern  ? Not on file  ?Social History Narrative  ? Lives [place] [facility] [place] with his wife. He is still driving. He enjoys golf and gardening (when is he is able to).  ? ?Social Determinants of Health  ? ?Financial Resource Strain: Low Risk   ? Difficulty of Paying Living Expenses: Not hard at all  ?Food Insecurity: No Food Insecurity  ? Worried About Charity fundraiser in the Last Year: Never true  ? Ran Out of Food in the Last Year: Never true  ?Transportation Needs: No Transportation Needs  ?  Lack of Transportation (Medical): No  ? Lack of Transportation (Non-Medical): No  ?Physical Activity: Sufficiently Active  ? Days of Exercise per Week: 4 days  ? Minutes of Exercise per Session: 50 min  ?Stress: No Stress Concern Present  ? Feeling of Stress : Not at all  ?Social Connections: Socially Integrated  ? Frequency of Communication with Friends and Family: More than three times a week  ? Frequency of Social Gatherings with Friends and Family: More than three times a week  ? Attends Religious Services: More than 4 times per year  ? Active Member of Clubs or Organizations: Yes  ? Attends Archivist Meetings: Never  ? Marital Status: Married   ? ? ?Family History  ?Problem Relation Age of Onset  ? Heart failure Father   ?     MI - died @ 43  ? Heart failure Mother   ? Diabetes Son   ? Pancreatitis Son   ?     also ETOH abuse   ? Cancer Maternal Grandmother   ? Heart disease Maternal Grandfather   ? Atrial fibrillation Brother   ?     dx'ed age 86  ? Atrial fibrillation Brother   ?     dx'ed age 9  ? Heart failure Brother   ? Atrial fibrillation Sister   ? Hypertension Sister   ? Heart Problems Sister   ? Diabetes Sister   ? Heart failure Sister   ? ? ?Health Maintenance  ?Topic Date Due  ? INFLUENZA VACCINE  10/21/2021  ? TETANUS/TDAP  09/06/2029  ? Pneumonia Vaccine 44+ Years old  Completed  ? COVID-19 Vaccine  Completed  ? Zoster Vaccines- Shingrix  Completed  ? HPV VACCINES  Aged Out  ? ? ? ?----------------------------------------------------------------------------------------------------------------------------------------------------------------------------------------------------------------- ?Physical Exam ?BP (!) 106/42 (BP Location: Left Arm, Patient Position: Sitting, Cuff Size: Normal)   Pulse 62   Ht 6' (1.829 m)   Wt 181 lb (82.1 kg)   SpO2 98%   BMI 24.55 kg/m?  ? ?Physical Exam ?Constitutional:   ?   Appearance: Normal appearance.  ?Cardiovascular:  ?   Rate and Rhythm: Normal rate and regular rhythm.  ?Pulmonary:  ?   Comments: Decreased breath sounds with rhonchi LLL.  ?Neurological:  ?   General: No focal deficit present.  ?   Mental Status: He is alert.  ?Psychiatric:     ?   Mood and Affect: Mood normal.     ?   Behavior: Behavior normal.  ? ? ?------------------------------------------------------------------------------------------------------------------------------------------------------------------------------------------------------------------- ?Assessment and Plan ? ?Multifocal pneumonia ?Concern for worsening pneumonia in LLL.  Will go ahead and repeat CXR today.  Recheck CMP/CBC today.  ? ? ?Update:  CXR with  worsening LLL pneumonia.  Initially was going to treat with levaquin due to recent hospitalization,  however there is significant interaction with his amiodarone.  Adding cefdinir and azithromycin.  If worsening wiill likely need readmission for IV Abx.   ? ? ?Meds ordered this encounter  ?Medications  ? AMBULATORY NON FORMULARY MEDICATION  ?  Sig: Please provide toilet riser/elevator with arms.  Diagnosis: Generalized weakness R53.1  ?  Dispense:  1 Units  ?  Refill:  0  ? cefdinir (OMNICEF) 300 MG capsule  ?  Sig: Take 1 capsule (300 mg total) by mouth 2 (two) times daily for 7 days.  ?  Dispense:  14 capsule  ?  Refill:  0  ? azithromycin (ZITHROMAX) 500 MG tablet  ?  Sig: Take  1 tablet (500 mg total) by mouth daily for 3 days.  ?  Dispense:  3 tablet  ?  Refill:  0  ? ? ?No follow-ups on file. ? ? ? ?This visit occurred during the SARS-CoV-2 public health emergency.  Safety protocols were in place, including screening questions prior to the visit, additional usage of staff PPE, and extensive cleaning of exam room while observing appropriate contact time as indicated for disinfecting solutions.  ? ?

## 2021-07-17 ENCOUNTER — Encounter: Payer: Self-pay | Admitting: Family Medicine

## 2021-07-23 ENCOUNTER — Other Ambulatory Visit: Payer: Self-pay | Admitting: Cardiovascular Disease

## 2021-07-23 ENCOUNTER — Other Ambulatory Visit: Payer: Self-pay | Admitting: Family Medicine

## 2021-07-23 DIAGNOSIS — I10 Essential (primary) hypertension: Secondary | ICD-10-CM

## 2021-07-24 ENCOUNTER — Encounter: Payer: Self-pay | Admitting: Family Medicine

## 2021-07-24 ENCOUNTER — Ambulatory Visit (INDEPENDENT_AMBULATORY_CARE_PROVIDER_SITE_OTHER): Payer: Medicare HMO | Admitting: Family Medicine

## 2021-07-24 VITALS — BP 136/65 | HR 65 | Temp 97.5°F | Ht 72.0 in | Wt 180.0 lb

## 2021-07-24 DIAGNOSIS — J189 Pneumonia, unspecified organism: Secondary | ICD-10-CM

## 2021-07-24 NOTE — Progress Notes (Signed)
?Samuel Mcconnell - 88 y.o. male MRN [id number]  Date of birth: [birth year] ? ?Subjective ?Chief Complaint  ?Patient presents with  ? Follow-up  ? ? ?HPI ?Samuel Mcconnell is a 88 y.o. male here today for follow up of pneumonia.  Was hospitalized initially with R sided pneumonia.  Found to have left sided infiltrate at follow up and continued to feel fatigued. Completed course of cefdinir and azithromycin.  Today he reports that he is feeling much better.  HE has completed antibiotics.  He continues to work with PT and SLP.  He finds this helpful but feels like he could do the things they are doing on his own.   Denies fever, chills, chest pain, or dyspnea.  ? ?ROS:  A comprehensive ROS was completed and negative except as noted per HPI ? ?Allergies  ?Allergen Reactions  ? Clindamycin/Lincomycin Other (See Comments)  ?  Patient "felt weird" ?  ? Dabigatran Etexilate Mesylate Other (See Comments)  ?  "felt weird" (name brand Pradaxa)  ? Doxycycline Other (See Comments)  ?  Dyspepsia  ? ? ?Past Medical History:  ?Diagnosis Date  ? Atrial fibrillation ([place])   ? radiofrequency ablation  ? Emphysema   ? Hypertension   ? Thyroid disease   ? ? ?Past Surgical History:  ?Procedure Laterality Date  ? CARDIAC SURGERY    ? CARDIOVERSION N/A 10/24/2019  ? Procedure: CARDIOVERSION;  Surgeon: Sanda Klein, MD;  Location: [place];  Service: Cardiovascular;  Laterality: N/A;  ? CARDIOVERSION N/A 10/22/2020  ? Procedure: CARDIOVERSION;  Surgeon: Thayer Headings, MD;  Location: [place];  Service: Cardiovascular;  Laterality: N/A;  ? [place]  ? KNEE ARTHROSCOPY Left 07/2000  ? LOOP RECORDER IMPLANT  01/16/2010  ? Medtronic Reveal XT (Dr. Norlene Duel)   ? RADIOFREQUENCY ABLATION  06/13/2009  ? SHOULDER ARTHROSCOPY WITH ROTATOR CUFF REPAIR Left 04/1996  ? TIBIAL TUBERCLERPLASTY  ~ 10/2010  ? TRANSTHORACIC ECHOCARDIOGRAM  11/19/2011  ? EF=>55%; mild MR, mild mitral annular calcif; mild TR, normal RSVP; AV mildly sclerotic, mild AV  regurg; trace pulm valve regurg   ? ? ?Social History  ? ?Socioeconomic History  ? Marital status: Married  ?  Spouse name: Barnett Applebaum  ? Number of children: 2  ? Years of education: 64  ? Highest education level: Bachelor's degree (e.g., BA, AB, BS)  ?Occupational History  ? Occupation: Retired  ?Tobacco Use  ? Smoking status: Former  ?  Years: 46.00  ?  Types: Cigarettes  ?  Quit date: 03/14/2002  ?  Years since quitting: 19.3  ? Smokeless tobacco: Never  ? Tobacco comments:  ?  Former smoker 01/23/2021  ?Vaping Use  ? Vaping Use: Never used  ?Substance and Sexual Activity  ? Alcohol use: No  ? Drug use: No  ? Sexual activity: Not Currently  ?  Partners: Female  ?Other Topics Concern  ? Not on file  ?Social History Narrative  ? Lives [place] [facility] [place] with his wife. He is still driving. He enjoys golf and gardening (when is he is able to).  ? ?Social Determinants of Health  ? ?Financial Resource Strain: Low Risk   ? Difficulty of Paying Living Expenses: Not hard at all  ?Food Insecurity: No Food Insecurity  ? Worried About Charity fundraiser in the Last Year: Never true  ? Ran Out of Food in the Last Year: Never true  ?Transportation Needs: No Transportation Needs  ? Lack of Transportation (Medical): No  ?  Lack of Transportation (Non-Medical): No  ?Physical Activity: Sufficiently Active  ? Days of Exercise per Week: 4 days  ? Minutes of Exercise per Session: 50 min  ?Stress: No Stress Concern Present  ? Feeling of Stress : Not at all  ?Social Connections: Socially Integrated  ? Frequency of Communication with Friends and Family: More than three times a week  ? Frequency of Social Gatherings with Friends and Family: More than three times a week  ? Attends Religious Services: More than 4 times per year  ? Active Member of Clubs or Organizations: Yes  ? Attends Archivist Meetings: Never  ? Marital Status: Married  ? ? ?Family History  ?Problem Relation Age of Onset  ? Heart failure Father   ?     MI - died  @ 66  ? Heart failure Mother   ? Diabetes Son   ? Pancreatitis Son   ?     also ETOH abuse   ? Cancer Maternal Grandmother   ? Heart disease Maternal Grandfather   ? Atrial fibrillation Brother   ?     dx'ed age 73  ? Atrial fibrillation Brother   ?     dx'ed age 55  ? Heart failure Brother   ? Atrial fibrillation Sister   ? Hypertension Sister   ? Heart Problems Sister   ? Diabetes Sister   ? Heart failure Sister   ? ? ?Health Maintenance  ?Topic Date Due  ? INFLUENZA VACCINE  10/21/2021  ? TETANUS/TDAP  09/06/2029  ? Pneumonia Vaccine 71+ Years old  Completed  ? COVID-19 Vaccine  Completed  ? Zoster Vaccines- Shingrix  Completed  ? HPV VACCINES  Aged Out  ? ? ? ?----------------------------------------------------------------------------------------------------------------------------------------------------------------------------------------------------------------- ?Physical Exam ?BP 136/65   Pulse 65   Temp (!) 97.5 ?F (36.4 ?C) (Oral)   Ht 6' (1.829 m)   Wt 180 lb (81.6 kg)   SpO2 92%   BMI 24.41 kg/m?  ? ?Physical Exam ?Constitutional:   ?   Appearance: Normal appearance.  ?Eyes:  ?   General: No scleral icterus. ?Cardiovascular:  ?   Rate and Rhythm: Normal rate and regular rhythm.  ?Pulmonary:  ?   Effort: Pulmonary effort is normal.  ?   Breath sounds: Normal breath sounds.  ?Musculoskeletal:  ?   Cervical back: Neck supple.  ?Neurological:  ?   Mental Status: He is alert.  ?Psychiatric:     ?   Mood and Affect: Mood normal.     ?   Behavior: Behavior normal.  ? ? ?------------------------------------------------------------------------------------------------------------------------------------------------------------------------------------------------------------------- ?Assessment and Plan ? ?Multifocal pneumonia ?Clinically seems to have resolved at this point.  He is feeling much better.  Will repeat CXR in 4-6 weeks to ensure complete resolution.  Follow up for new/worsening symptoms.  Continue aspiration precautions and continue to work with SLP.   ? ? ?No orders of the defined types were placed in this encounter. ? ? ?No follow-ups on file. ? ? ? ?This visit occurred during the SARS-CoV-2 public health emergency.  Safety protocols were in place, including screening questions prior to the visit, additional usage of staff PPE, and extensive cleaning of exam room while observing appropriate contact time as indicated for disinfecting solutions.  ? ?

## 2021-07-24 NOTE — Assessment & Plan Note (Signed)
Clinically seems to have resolved at this point.  He is feeling much better.  Will repeat CXR in 4-6 weeks to ensure complete resolution.  Follow up for new/worsening symptoms. Continue aspiration precautions and continue to work with SLP.   ?

## 2021-08-11 DIAGNOSIS — L570 Actinic keratosis: Secondary | ICD-10-CM | POA: Diagnosis not present

## 2021-09-15 ENCOUNTER — Other Ambulatory Visit: Payer: Self-pay | Admitting: Cardiovascular Disease

## 2021-09-17 ENCOUNTER — Encounter: Payer: Self-pay | Admitting: Family Medicine

## 2021-09-18 ENCOUNTER — Ambulatory Visit (INDEPENDENT_AMBULATORY_CARE_PROVIDER_SITE_OTHER): Payer: Medicare HMO

## 2021-09-18 DIAGNOSIS — J189 Pneumonia, unspecified organism: Secondary | ICD-10-CM

## 2021-10-25 ENCOUNTER — Other Ambulatory Visit: Payer: Self-pay

## 2021-10-25 ENCOUNTER — Inpatient Hospital Stay (HOSPITAL_COMMUNITY)
Admission: EM | Admit: 2021-10-25 | Discharge: 2021-10-29 | DRG: 177 | Disposition: A | Discharge status: Home Health | Payer: Medicare HMO | Attending: Internal Medicine | Admitting: Internal Medicine

## 2021-10-25 ENCOUNTER — Emergency Department (HOSPITAL_COMMUNITY): Payer: Medicare HMO

## 2021-10-25 ENCOUNTER — Encounter (HOSPITAL_COMMUNITY): Payer: Self-pay

## 2021-10-25 DIAGNOSIS — Z87891 Personal history of nicotine dependence: Secondary | ICD-10-CM

## 2021-10-25 DIAGNOSIS — W19XXXA Unspecified fall, initial encounter: Secondary | ICD-10-CM | POA: Diagnosis present

## 2021-10-25 DIAGNOSIS — N183 Chronic kidney disease, stage 3 unspecified: Secondary | ICD-10-CM | POA: Diagnosis present

## 2021-10-25 DIAGNOSIS — Z79890 Hormone replacement therapy (postmenopausal): Secondary | ICD-10-CM

## 2021-10-25 DIAGNOSIS — I5032 Chronic diastolic (congestive) heart failure: Secondary | ICD-10-CM

## 2021-10-25 DIAGNOSIS — Z888 Allergy status to other drugs, medicaments and biological substances status: Secondary | ICD-10-CM

## 2021-10-25 DIAGNOSIS — R1312 Dysphagia, oropharyngeal phase: Secondary | ICD-10-CM | POA: Diagnosis present

## 2021-10-25 DIAGNOSIS — K5900 Constipation, unspecified: Secondary | ICD-10-CM | POA: Diagnosis present

## 2021-10-25 DIAGNOSIS — I480 Paroxysmal atrial fibrillation: Secondary | ICD-10-CM | POA: Diagnosis not present

## 2021-10-25 DIAGNOSIS — J9601 Acute respiratory failure with hypoxia: Secondary | ICD-10-CM | POA: Diagnosis present

## 2021-10-25 DIAGNOSIS — Z8249 Family history of ischemic heart disease and other diseases of the circulatory system: Secondary | ICD-10-CM | POA: Diagnosis not present

## 2021-10-25 DIAGNOSIS — R509 Fever, unspecified: Secondary | ICD-10-CM | POA: Diagnosis not present

## 2021-10-25 DIAGNOSIS — A419 Sepsis, unspecified organism: Secondary | ICD-10-CM | POA: Diagnosis not present

## 2021-10-25 DIAGNOSIS — Z79899 Other long term (current) drug therapy: Secondary | ICD-10-CM

## 2021-10-25 DIAGNOSIS — I130 Hypertensive heart and chronic kidney disease with heart failure and stage 1 through stage 4 chronic kidney disease, or unspecified chronic kidney disease: Secondary | ICD-10-CM | POA: Diagnosis not present

## 2021-10-25 DIAGNOSIS — U071 COVID-19: Secondary | ICD-10-CM | POA: Diagnosis present

## 2021-10-25 DIAGNOSIS — I517 Cardiomegaly: Secondary | ICD-10-CM

## 2021-10-25 DIAGNOSIS — N1831 Chronic kidney disease, stage 3a: Secondary | ICD-10-CM | POA: Diagnosis present

## 2021-10-25 DIAGNOSIS — E039 Hypothyroidism, unspecified: Secondary | ICD-10-CM | POA: Diagnosis not present

## 2021-10-25 DIAGNOSIS — J189 Pneumonia, unspecified organism: Principal | ICD-10-CM

## 2021-10-25 DIAGNOSIS — I10 Essential (primary) hypertension: Secondary | ICD-10-CM | POA: Diagnosis present

## 2021-10-25 DIAGNOSIS — J439 Emphysema, unspecified: Secondary | ICD-10-CM | POA: Diagnosis not present

## 2021-10-25 DIAGNOSIS — J690 Pneumonitis due to inhalation of food and vomit: Secondary | ICD-10-CM | POA: Diagnosis not present

## 2021-10-25 DIAGNOSIS — Z833 Family history of diabetes mellitus: Secondary | ICD-10-CM

## 2021-10-25 DIAGNOSIS — I4819 Other persistent atrial fibrillation: Secondary | ICD-10-CM | POA: Diagnosis present

## 2021-10-25 DIAGNOSIS — Z7901 Long term (current) use of anticoagulants: Secondary | ICD-10-CM

## 2021-10-25 DIAGNOSIS — J449 Chronic obstructive pulmonary disease, unspecified: Secondary | ICD-10-CM | POA: Diagnosis present

## 2021-10-25 DIAGNOSIS — J1282 Pneumonia due to coronavirus disease 2019: Secondary | ICD-10-CM | POA: Diagnosis not present

## 2021-10-25 DIAGNOSIS — N1832 Chronic kidney disease, stage 3b: Secondary | ICD-10-CM | POA: Diagnosis present

## 2021-10-25 DIAGNOSIS — R531 Weakness: Secondary | ICD-10-CM | POA: Diagnosis not present

## 2021-10-25 LAB — CBC WITH DIFFERENTIAL/PLATELET
Abs Immature Granulocytes: 0.03 10*3/uL (ref 0.00–0.07)
Basophils Absolute: 0 10*3/uL (ref 0.0–0.1)
Basophils Relative: 1 %
Eosinophils Absolute: 0 10*3/uL (ref 0.0–0.5)
Eosinophils Relative: 0 %
HCT: 37.6 % — ABNORMAL LOW (ref 39.0–52.0)
Hemoglobin: 12.4 g/dL — ABNORMAL LOW (ref 13.0–17.0)
Immature Granulocytes: 0 %
Lymphocytes Relative: 12 %
Lymphs Abs: 0.9 10*3/uL (ref 0.7–4.0)
MCH: 28.9 pg (ref 26.0–34.0)
MCHC: 33 g/dL (ref 30.0–36.0)
MCV: 87.6 fL (ref 80.0–100.0)
Monocytes Absolute: 1.2 10*3/uL — ABNORMAL HIGH (ref 0.1–1.0)
Monocytes Relative: 15 %
Neutro Abs: 5.7 10*3/uL (ref 1.7–7.7)
Neutrophils Relative %: 72 %
Platelets: 217 10*3/uL (ref 150–400)
RBC: 4.29 MIL/uL (ref 4.22–5.81)
RDW: 13.6 % (ref 11.5–15.5)
WBC: 7.9 10*3/uL (ref 4.0–10.5)
nRBC: 0 % (ref 0.0–0.2)

## 2021-10-25 LAB — COMPREHENSIVE METABOLIC PANEL
ALT: 27 U/L (ref 0–44)
AST: 41 U/L (ref 15–41)
Albumin: 3.7 g/dL (ref 3.5–5.0)
Alkaline Phosphatase: 47 U/L (ref 38–126)
Anion gap: 7 (ref 5–15)
BUN: 23 mg/dL (ref 8–23)
CO2: 26 mmol/L (ref 22–32)
Calcium: 9 mg/dL (ref 8.9–10.3)
Chloride: 104 mmol/L (ref 98–111)
Creatinine, Ser: 1.51 mg/dL — ABNORMAL HIGH (ref 0.61–1.24)
GFR, Estimated: 43 mL/min — ABNORMAL LOW (ref >60)
Glucose, Bld: 106 mg/dL — ABNORMAL HIGH (ref 70–99)
Potassium: 4.6 mmol/L (ref 3.5–5.1)
Sodium: 137 mmol/L (ref 135–145)
Total Bilirubin: 0.9 mg/dL (ref 0.3–1.2)
Total Protein: 6.3 g/dL — ABNORMAL LOW (ref 6.5–8.1)

## 2021-10-25 LAB — URINALYSIS, ROUTINE W REFLEX MICROSCOPIC
Bilirubin Urine: NEGATIVE
Glucose, UA: NEGATIVE mg/dL
Hgb urine dipstick: NEGATIVE
Ketones, ur: NEGATIVE mg/dL
Leukocytes,Ua: NEGATIVE
Nitrite: NEGATIVE
Protein, ur: NEGATIVE mg/dL
Specific Gravity, Urine: 1.018 (ref 1.005–1.030)
pH: 6 (ref 5.0–8.0)

## 2021-10-25 LAB — LACTIC ACID, PLASMA
Lactic Acid, Venous: 1 mmol/L (ref 0.5–1.9)
Lactic Acid, Venous: 0.9 mmol/L (ref 0.5–1.9)

## 2021-10-25 LAB — SARS CORONAVIRUS 2 BY RT PCR: SARS Coronavirus 2 by RT PCR: POSITIVE — AB

## 2021-10-25 MED ORDER — DIPHENHYDRAMINE-APAP (SLEEP) 25-500 MG PO TABS: 1.0000 | ORAL_TABLET | Freq: Every evening | ORAL | Status: DC | PRN

## 2021-10-25 MED ORDER — ACETAMINOPHEN 325 MG PO TABS
650.0000 mg | ORAL_TABLET | Freq: Once | ORAL | Status: AC | PRN
  Administered 2021-10-25: 650 mg via ORAL
  Filled 2021-10-25: qty 2

## 2021-10-25 MED ORDER — LEVOTHYROXINE SODIUM 25 MCG PO TABS
137.0000 ug | ORAL_TABLET | Freq: Every day | ORAL | Status: DC
  Administered 2021-10-26 – 2021-10-29 (×4): 137 ug via ORAL
  Filled 2021-10-25 (×4): qty 1

## 2021-10-25 MED ORDER — ACETAMINOPHEN 500 MG PO TABS: 500.0000 mg | ORAL_TABLET | Freq: Four times a day (QID) | ORAL | Status: DC | PRN

## 2021-10-25 MED ORDER — ACETAMINOPHEN 500 MG PO TABS: 500.0000 mg | ORAL_TABLET | Freq: Every evening | ORAL | Status: DC | PRN

## 2021-10-25 MED ORDER — ALBUTEROL SULFATE HFA 108 (90 BASE) MCG/ACT IN AERS
2.0000 | INHALATION_SPRAY | Freq: Four times a day (QID) | RESPIRATORY_TRACT | Status: DC | PRN
Start: 2021-10-25 — End: 2021-10-25

## 2021-10-25 MED ORDER — ONDANSETRON HCL 4 MG PO TABS: 4.0000 mg | ORAL_TABLET | Freq: Four times a day (QID) | ORAL | Status: DC | PRN

## 2021-10-25 MED ORDER — SODIUM CHLORIDE 0.9 % IV SOLN
3.0000 g | Freq: Two times a day (BID) | INTRAVENOUS | Status: DC
  Administered 2021-10-26 – 2021-10-27 (×3): 3 g via INTRAVENOUS
  Filled 2021-10-25 (×4): qty 8

## 2021-10-25 MED ORDER — ONDANSETRON HCL 4 MG/2ML IJ SOLN: 4.0000 mg | Freq: Four times a day (QID) | INTRAMUSCULAR | Status: DC | PRN

## 2021-10-25 MED ORDER — AMIODARONE HCL 200 MG PO TABS
200.0000 mg | ORAL_TABLET | Freq: Every day | ORAL | Status: DC
  Administered 2021-10-26 – 2021-10-29 (×4): 200 mg via ORAL
  Filled 2021-10-25 (×5): qty 1

## 2021-10-25 MED ORDER — SODIUM CHLORIDE 0.9 % IV BOLUS
1000.0000 mL | Freq: Once | INTRAVENOUS | Status: AC
  Administered 2021-10-25: 1000 mL via INTRAVENOUS

## 2021-10-25 MED ORDER — SODIUM CHLORIDE 0.9 % IV SOLN
1.0000 g | Freq: Once | INTRAVENOUS | Status: AC
  Administered 2021-10-25: 1 g via INTRAVENOUS
  Filled 2021-10-25: qty 10

## 2021-10-25 MED ORDER — ALBUTEROL SULFATE HFA 108 (90 BASE) MCG/ACT IN AERS
2.0000 | INHALATION_SPRAY | Freq: Four times a day (QID) | RESPIRATORY_TRACT | Status: DC | PRN
  Filled 2021-10-25: qty 6.7

## 2021-10-25 MED ORDER — APIXABAN 2.5 MG PO TABS
2.5000 mg | ORAL_TABLET | Freq: Two times a day (BID) | ORAL | Status: DC
  Administered 2021-10-25 – 2021-10-29 (×8): 2.5 mg via ORAL
  Filled 2021-10-25 (×8): qty 1

## 2021-10-25 MED ORDER — MOMETASONE FURO-FORMOTEROL FUM 200-5 MCG/ACT IN AERO
2.0000 | INHALATION_SPRAY | Freq: Two times a day (BID) | RESPIRATORY_TRACT | Status: DC
  Administered 2021-10-25 – 2021-10-29 (×8): 2 via RESPIRATORY_TRACT
  Filled 2021-10-25 (×2): qty 8.8

## 2021-10-25 MED ORDER — DIPHENHYDRAMINE HCL 25 MG PO CAPS: 25.0000 mg | ORAL_CAPSULE | Freq: Every evening | ORAL | Status: DC | PRN

## 2021-10-25 MED ORDER — SODIUM CHLORIDE 0.9 % IV SOLN
500.0000 mg | Freq: Once | INTRAVENOUS | Status: AC
  Administered 2021-10-25: 500 mg via INTRAVENOUS
  Filled 2021-10-25: qty 5

## 2021-10-25 NOTE — Progress Notes (Signed)
NEW ADMISSION NOTE New Admission Note:   Arrival Method: stretcher Mental Orientation: A&o x4 Telemetry: box 3 Assessment: Completed Skin: intact  bruising IV: bilateral arms Pain: zero Tubes: none present Safety Measures: Safety Fall Prevention Plan has been given, discussed and signed Admission: Completed [facility] Orientation: Patient has been orientated to the room, unit and staff.  Family: daughter wife and son in law present  Orders have been reviewed and implemented. Will continue to monitor the patient. Call light has been placed within reach and bed alarm has been activated.   Berneta Levins, RN

## 2021-10-25 NOTE — Progress Notes (Signed)
Pharmacy Antibiotic Note  Samuel Mcconnell is a 88 y.o. male admitted on 10/25/2021 with aspiration pneumonia.  Patient with known dysphagia and CXR findings for early or atypical pneumonia. Pharmacy has been consulted for ampicillin/sulbactam dosing. Received azithromycin and ceftriaxone x1.   ClCr 33 ml/min is borderline with Scr slightly above baseline.  Plan: Ampicillin/sulbactam 3g q12 hr  Monitor cultures, clinical status, renal function, vancomycin level Narrow abx as able and f/u duration     Height: '5\' 11"'$  (180.3 cm) Weight: 82.6 kg (182 lb) IBW/kg (Calculated) : 75.3  Temp (24hrs), Avg:100.6 F (38.1 C), Min:99.9 F (37.7 C), Max:101.3 F (38.5 C)  Recent Labs  Lab 10/25/21 1400  WBC 7.9  CREATININE 1.51*  LATICACIDVEN 1.0    Estimated Creatinine Clearance: 33.2 mL/min (A) (by C-G formula based on SCr of 1.51 mg/dL (H)).    Allergies  Allergen Reactions   Clindamycin/Lincomycin Other (See Comments)    Patient "felt weird"    Dabigatran Etexilate Mesylate Other (See Comments)    "felt weird" (name brand Pradaxa)   Doxycycline Other (See Comments)    Dyspepsia    Antimicrobials this admission: ampsulb 8/5 >>   Azithro 8/5 CTX 8/5   Microbiology results: 8/5 BCx: pending 8/5 UCx: pending   Thank you for allowing pharmacy to be a part of this patient's care.  Benetta Spar, PharmD, BCPS, BCCP Clinical Pharmacist  Please check AMION for all [place] phone numbers After 10:00 PM, call [place] [phone]

## 2021-10-25 NOTE — ED Notes (Signed)
Pt.unable to get urine per pt. at

## 2021-10-25 NOTE — H&P (Signed)
History and Physical    Samuel Mcconnell PPJ:[id number] DOB: [birth year] DOA: 10/25/2021  Referring MD/NP/PA: EDP PCP:  Patient coming from: Home  Chief Complaint: weakness  HPI: Samuel Mcconnell is a 88/M with history of COPD, paroxysmal A-fib, hypertension, hypothyroidism, history of dysphagia and prior pneumonia presented to the ED with weakness and fever that started today.  Patient is a very poor historian, reports feeling weaker than usual yesterday, he fell this morning and was quite unsteady, daughter came to check on him and found him to be febrile and weak with some confusion, history of cough reported for last 2 to 3 days. ED Course: Fever of 101.3 WBC 7.9, creatinine 1.5, chest x-ray with bibasilar opacities concerning for pneumonia  Review of Systems: As per HPI otherwise 14 point review of systems negative.   Past Medical History:  Diagnosis Date   Atrial fibrillation ([place])    radiofrequency ablation   Emphysema    Hypertension    Thyroid disease     Past Surgical History:  Procedure Laterality Date   CARDIAC SURGERY     CARDIOVERSION N/A 10/24/2019   Procedure: CARDIOVERSION;  Surgeon: Sanda Klein, MD;  Location: [place];  Service: Cardiovascular;  Laterality: N/A;   CARDIOVERSION N/A 10/22/2020   Procedure: CARDIOVERSION;  Surgeon: Thayer Headings, MD;  Location: [place];  Service: Cardiovascular;  Laterality: N/A;   COLON SURGERY  1999   KNEE ARTHROSCOPY Left 07/2000   LOOP RECORDER IMPLANT  01/16/2010   Medtronic Reveal XT (Dr. Norlene Duel)    RADIOFREQUENCY ABLATION  06/13/2009   SHOULDER ARTHROSCOPY WITH ROTATOR CUFF REPAIR Left 04/1996   TIBIAL TUBERCLERPLASTY  ~ 10/2010   TRANSTHORACIC ECHOCARDIOGRAM  11/19/2011   EF=>55%; mild MR, mild mitral annular calcif; mild TR, normal RSVP; AV mildly sclerotic, mild AV regurg; trace pulm valve regurg      reports that he quit smoking about 19 years ago. His smoking use included cigarettes. He has never used smokeless  tobacco. He reports that he does not drink alcohol and does not use drugs.  Allergies  Allergen Reactions   Clindamycin/Lincomycin Other (See Comments)    Patient "felt weird"    Dabigatran Etexilate Mesylate Other (See Comments)    "felt weird" (name brand Pradaxa)   Doxycycline Other (See Comments)    Dyspepsia    Family History  Problem Relation Age of Onset   Heart failure Father        MI - died @ 72   Heart failure Mother    Diabetes Son    Pancreatitis Son        also ETOH abuse    Cancer Maternal Grandmother    Heart disease Maternal Grandfather    Atrial fibrillation Brother        dx'ed age 30   Atrial fibrillation Brother        dx'ed age 62   Heart failure Brother    Atrial fibrillation Sister    Hypertension Sister    Heart Problems Sister    Diabetes Sister    Heart failure Sister      Prior to Admission medications   Medication Sig Start Date End Date Taking? Authorizing Provider  acetaminophen (TYLENOL) 500 MG tablet Take 500 mg by mouth daily as needed for moderate pain.    [provider]  albuterol (VENTOLIN HFA) 108 (90 Base) MCG/ACT inhaler Inhale 2 puffs into the lungs every 6 (six) hours as needed for wheezing or shortness of breath.  [provider]  AMBULATORY NON FORMULARY MEDICATION Please provide toilet riser/elevator with arms.  Diagnosis: Generalized weakness R53.1 07/11/21   Luetta Nutting, DO  amiodarone (PACERONE) 200 MG tablet TAKE 1 TABLET EVERY DAY Patient taking differently: Take 200 mg by mouth daily. 04/21/21   Croitoru, Mihai, MD  Cholecalciferol (VITAMIN D3) 125 MCG (5000 UT) TABS Take 5,000 Units by mouth daily with lunch.    [provider]  diphenhydramine-acetaminophen (TYLENOL PM) 25-500 MG TABS tablet Take 1 tablet by mouth at bedtime as needed (for sleeplessness).    [provider]  ELIQUIS 2.5 MG TABS tablet TAKE 1 TABLET TWICE DAILY (DOSE CHANGE) Patient taking differently: Take 2.5 mg  by mouth 2 (two) times daily. 01/06/21   Croitoru, Mihai, MD  fluticasone (FLONASE) 50 MCG/ACT nasal spray Place 2 sprays into both nostrils daily. Patient taking differently: Place 2 sprays into both nostrils daily as needed for allergies or rhinitis. 05/21/21 08/19/21  Luetta Nutting, DO  fluticasone-salmeterol (ADVAIR) 250-50 MCG/ACT AEPB INHALE 1 PUFF TWICE DAILY INTO THE LUNGS (NEED MD APPOINTMENT FOR REFILLS) 09/17/21   Croitoru, Dani Gobble, MD  furosemide (LASIX) 20 MG tablet TAKE 1 TABLET EVERY DAY AS NEEDED FOR  EDEMA 06/19/21   Luetta Nutting, DO  Ketotifen Fumarate (ITCHY EYE DROPS OP) Place 1 drop into both eyes daily as needed (itchy eyes).    [provider]  levothyroxine (SYNTHROID) 137 MCG tablet TAKE 1 TABLET  DAILY BEFORE BREAKFAST. 07/23/21   Luetta Nutting, DO  losartan (COZAAR) 25 MG tablet Take 1 tablet (25 mg total) by mouth at bedtime. 05/21/21   Luetta Nutting, DO  Multiple Vitamin (MULTIVITAMIN) capsule Take 1 capsule by mouth daily.    [provider]  vitamin C (ASCORBIC ACID) 500 MG tablet Take 500 mg by mouth daily with lunch.    [provider]  zinc gluconate 50 MG tablet Take 50 mg by mouth daily with lunch.    [provider]    Physical Exam: Vitals:   10/25/21 1358 10/25/21 1515 10/25/21 1556 10/25/21 1556  BP:  (!) 98/52 (!) 101/56   Pulse:  (!) 125 (!) 58   Resp:  18 20   Temp:   99.9 F (37.7 C)   TempSrc:   Oral   SpO2:  (!) 89% 99% 99%  Weight: 82.6 kg     Height: '5\' 11"'$  (1.803 m)       Elderly chronically ill male sitting up in bed, awake alert oriented to self and place, moderate cognitive deficits CVS: S1-S2, regular rhythm Lungs: Scattered rhonchi at the bases otherwise clear Abdomen: Soft, nontender, bowel sounds present Extremities: 1+ edema in both ankles Neuro: Moves all extremities, no localizing signs  Labs on Admission: I have personally reviewed following labs and imaging studies  CBC: Recent Labs  Lab  10/25/21 1400  WBC 7.9  NEUTROABS 5.7  HGB 12.4*  HCT 37.6*  MCV 87.6  PLT 277   Basic Metabolic Panel: Recent Labs  Lab 10/25/21 1400  NA 137  K 4.6  CL 104  CO2 26  GLUCOSE 106*  BUN 23  CREATININE 1.51*  CALCIUM 9.0   GFR: Estimated Creatinine Clearance: 33.2 mL/min (A) (by C-G formula based on SCr of 1.51 mg/dL (H)). Liver Function Tests: Recent Labs  Lab 10/25/21 1400  AST 41  ALT 27  ALKPHOS 47  BILITOT 0.9  PROT 6.3*  ALBUMIN 3.7   No results for input(s): "LIPASE", "AMYLASE" in the last 168 hours.  No results for input(s): "AMMONIA" in the last 168 hours. Coagulation Profile: No results for input(s): "INR", "PROTIME" in the last 168 hours. Cardiac Enzymes: No results for input(s): "CKTOTAL", "CKMB", "CKMBINDEX", "TROPONINI" in the last 168 hours. BNP (last 3 results) No results for input(s): "PROBNP" in the last 8760 hours. HbA1C: No results for input(s): "HGBA1C" in the last 72 hours. CBG: No results for input(s): "GLUCAP" in the last 168 hours. Lipid Profile: No results for input(s): "CHOL", "HDL", "LDLCALC", "TRIG", "CHOLHDL", "LDLDIRECT" in the last 72 hours. Thyroid Function Tests: No results for input(s): "TSH", "T4TOTAL", "FREET4", "T3FREE", "THYROIDAB" in the last 72 hours. Anemia Panel: No results for input(s): "VITAMINB12", "FOLATE", "FERRITIN", "TIBC", "IRON", "RETICCTPCT" in the last 72 hours. Urine analysis:    Component Value Date/Time   COLORURINE STRAW (A) 01/16/2010 2200   APPEARANCEUR CLEAR 01/16/2010 2200   LABSPEC 1.003 (L) 01/16/2010 2200   PHURINE 6.5 01/16/2010 2200   GLUCOSEU NEGATIVE 01/16/2010 2200   HGBUR NEGATIVE 01/16/2010 2200   BILIRUBINUR NEGATIVE 01/16/2010 2200   KETONESUR NEGATIVE 01/16/2010 2200   PROTEINUR NEGATIVE 01/16/2010 2200   UROBILINOGEN 0.2 01/16/2010 2200   NITRITE NEGATIVE 01/16/2010 2200   LEUKOCYTESUR  01/16/2010 2200    NEGATIVE MICROSCOPIC NOT DONE ON URINES WITH NEGATIVE PROTEIN, BLOOD,  LEUKOCYTES, NITRITE, OR GLUCOSE <1000 mg/dL.   Sepsis Labs: '@LABRCNTIP'$ (procalcitonin:4,lacticidven:4) )No results found for this or any previous visit (from the past 240 hour(s)).   Radiological Exams on Admission: DG Chest 2 View  Result Date: 10/25/2021 CLINICAL DATA:  Fever, low oxygen saturation. EXAM: CHEST - 2 VIEW COMPARISON:  October 18, 2021 FINDINGS: Tortuosity and calcific atherosclerotic disease of the aorta. Cardiomediastinal silhouette is normal. Mediastinal contours appear intact. Streaky peribronchial airspace opacities in bilateral lung bases. Osseous structures are without acute abnormality. Soft tissues are grossly normal. IMPRESSION: 1. Streaky peribronchial airspace opacities in bilateral lung bases may represent early or atypical pneumonia. 2. Tortuosity and calcific atherosclerotic disease of the aorta. Electronically Signed   By: Fidela Salisbury M.D.   On: 10/25/2021 14:28    EKG: pending  Assessment/Plan    Aspiration pneumonia ([place])  -Known history of dysphagia and prior pneumonia -SLP evaluation from April reviewed -Start IV Unasyn -Repeat SLP evaluation -Follow-up blood cultures, COVID PCR was also sent on admission, pending at this time    PAF (paroxysmal atrial fibrillation) (H[facility]) -Currently in sinus rhythm, continue amiodarone and Eliquis    Chronic obstructive pulmonary disease ([facility]) -Stable no wheezing, continue albuterol as needed and ICS/LABA    Essential hypertension -Stable , will hold ARB today    Hypothyroid -Continue Synthroid    CKD 3a -Creatinine stable at baseline, holding ARB    Chronic diastolic CHF (congestive heart failure) ([place]) -Last echo 2018 with preserved  -Has 1+ peripheral edema, however with soft BPs will hold Lasix today -Resume in 1 to 2 days as blood pressure tolerates   DVT prophylaxis: eliquis Code Status: Full Code, I recommended reconsideration of this Family Communication: d/w daughter at bedside Disposition  Plan: [place] home Consults called: none Admission status: inpatient  Domenic Polite MD [facility]   10/25/2021, 4:42 PM

## 2021-10-25 NOTE — ED Triage Notes (Signed)
Pt arrived POV from home c/o a fever and generalized weakness since last night.

## 2021-10-25 NOTE — ED Notes (Signed)
Dr. Darl Householder request that pt has assistance walking with pulse ox on, but Mr. Groesbeck states he uses a rolling walker and requires assistance getting up to sitting position at this time. Information relayed to MD.

## 2021-10-25 NOTE — ED Provider Notes (Signed)
[facility] EMERGENCY DEPARTMENT Provider Note   CSN: [id number] Arrival date & time: 10/25/21  1347     History  Chief Complaint  Patient presents with   Fever   Weakness    Samuel Mcconnell is a 88 y.o. male hx of pneumonia, A-fib on Eliquis here presenting with fever and weakness.  Patient lives at a independent facility.  Patient states that he has been weak all over.  Has some nonproductive cough and generalized weakness.  Patient was hospitalized for pneumonia back in April and required IV antibiotics.  The history is provided by the patient.       Home Medications Prior to Admission medications   Medication Sig Start Date End Date Taking? Authorizing Provider  acetaminophen (TYLENOL) 500 MG tablet Take 500 mg by mouth daily as needed for moderate pain.    [provider]  albuterol (VENTOLIN HFA) 108 (90 Base) MCG/ACT inhaler Inhale 2 puffs into the lungs every 6 (six) hours as needed for wheezing or shortness of breath.    [provider]  AMBULATORY NON FORMULARY MEDICATION Please provide toilet riser/elevator with arms.  Diagnosis: Generalized weakness R53.1 07/11/21   Luetta Nutting, DO  amiodarone (PACERONE) 200 MG tablet TAKE 1 TABLET EVERY DAY Patient taking differently: Take 200 mg by mouth daily. 04/21/21   Croitoru, Mihai, MD  Cholecalciferol (VITAMIN D3) 125 MCG (5000 UT) TABS Take 5,000 Units by mouth daily with lunch.    [provider]  diphenhydramine-acetaminophen (TYLENOL PM) 25-500 MG TABS tablet Take 1 tablet by mouth at bedtime as needed (for sleeplessness).    [provider]  ELIQUIS 2.5 MG TABS tablet TAKE 1 TABLET TWICE DAILY (DOSE CHANGE) Patient taking differently: Take 2.5 mg by mouth 2 (two) times daily. 01/06/21   Croitoru, Mihai, MD  fluticasone (FLONASE) 50 MCG/ACT nasal spray Place 2 sprays into both nostrils daily. Patient taking differently: Place 2 sprays into both nostrils daily as needed  for allergies or rhinitis. 05/21/21 08/19/21  Luetta Nutting, DO  fluticasone-salmeterol (ADVAIR) 250-50 MCG/ACT AEPB INHALE 1 PUFF TWICE DAILY INTO THE LUNGS (NEED MD APPOINTMENT FOR REFILLS) 09/17/21   Croitoru, Dani Gobble, MD  furosemide (LASIX) 20 MG tablet TAKE 1 TABLET EVERY DAY AS NEEDED FOR  EDEMA 06/19/21   Luetta Nutting, DO  Ketotifen Fumarate (ITCHY EYE DROPS OP) Place 1 drop into both eyes daily as needed (itchy eyes).    [provider]  levothyroxine (SYNTHROID) 137 MCG tablet TAKE 1 TABLET  DAILY BEFORE BREAKFAST. 07/23/21   Luetta Nutting, DO  losartan (COZAAR) 25 MG tablet Take 1 tablet (25 mg total) by mouth at bedtime. 05/21/21   Luetta Nutting, DO  Multiple Vitamin (MULTIVITAMIN) capsule Take 1 capsule by mouth daily.    [provider]  vitamin C (ASCORBIC ACID) 500 MG tablet Take 500 mg by mouth daily with lunch.    [provider]  zinc gluconate 50 MG tablet Take 50 mg by mouth daily with lunch.    [provider]      Allergies    Clindamycin/lincomycin, Dabigatran etexilate mesylate, and Doxycycline    Review of Systems   Review of Systems  Constitutional:  Positive for fever.  Neurological:  Positive for weakness.  All other systems reviewed and are negative.   Physical Exam Updated Vital Signs BP (!) 98/52   Pulse (!) 125   Temp (!) 101.3 F (38.5 C) (Oral)   Resp 18   Ht '5\' 11"'$  (1.803 m)  Wt 82.6 kg   SpO2 (!) 89%   BMI 25.38 kg/m  Physical Exam Vitals and nursing note reviewed.  Constitutional:      Appearance: Normal appearance.  HENT:     Head: Normocephalic.     Nose: Nose normal.     Mouth/Throat:     Mouth: Mucous membranes are moist.  Eyes:     Extraocular Movements: Extraocular movements intact.     Pupils: Pupils are equal, round, and reactive to light.  Cardiovascular:     Rate and Rhythm: Regular rhythm. Tachycardia present.     Heart sounds: Normal heart sounds.  Pulmonary:     Comments: Crackles  bilateral base Abdominal:     General: Abdomen is flat.     Palpations: Abdomen is soft.  Musculoskeletal:        General: Normal range of motion.     Cervical back: Normal range of motion and neck supple.  Skin:    General: Skin is warm.     Capillary Refill: Capillary refill takes less than 2 seconds.  Neurological:     General: No focal deficit present.     Mental Status: He is alert and oriented to person, place, and time.  Psychiatric:        Mood and Affect: Mood normal.        Behavior: Behavior normal.     ED Results / Procedures / Treatments   Labs (all labs ordered are listed, but only abnormal results are displayed) Labs Reviewed  COMPREHENSIVE METABOLIC PANEL - Abnormal; Notable for the following components:      Result Value   Glucose, Bld 106 (*)    Creatinine, Ser 1.51 (*)    Total Protein 6.3 (*)    GFR, Estimated 43 (*)    All other components within normal limits  CBC WITH DIFFERENTIAL/PLATELET - Abnormal; Notable for the following components:   Hemoglobin 12.4 (*)    HCT 37.6 (*)    Monocytes Absolute 1.2 (*)    All other components within normal limits  URINE CULTURE  SARS CORONAVIRUS 2 BY RT PCR  CULTURE, BLOOD (ROUTINE X 2)  CULTURE, BLOOD (ROUTINE X 2)  LACTIC ACID, PLASMA  LACTIC ACID, PLASMA  URINALYSIS, ROUTINE W REFLEX MICROSCOPIC    EKG EKG Interpretation  Date/Time:  Saturday October 25 2021 13:59:17 EDT Ventricular Rate:  79 PR Interval:  220 QRS Duration: 138 QT Interval:  464 QTC Calculation: 532 R Axis:   86 Text Interpretation: Sinus rhythm with 1st degree A-V block Right bundle branch block Abnormal ECG When compared with ECG of 02-Jul-2021 11:32, PREVIOUS ECG IS PRESENT No previous ECGs available Confirmed by Wandra Arthurs [phone]) on 10/25/2021 3:04:05 PM  Radiology DG Chest 2 View  Result Date: 10/25/2021 CLINICAL DATA:  Fever, low oxygen saturation. EXAM: CHEST - 2 VIEW COMPARISON:  October 18, 2021 FINDINGS: Tortuosity and  calcific atherosclerotic disease of the aorta. Cardiomediastinal silhouette is normal. Mediastinal contours appear intact. Streaky peribronchial airspace opacities in bilateral lung bases. Osseous structures are without acute abnormality. Soft tissues are grossly normal. IMPRESSION: 1. Streaky peribronchial airspace opacities in bilateral lung bases may represent early or atypical pneumonia. 2. Tortuosity and calcific atherosclerotic disease of the aorta. Electronically Signed   By: Fidela Salisbury M.D.   On: 10/25/2021 14:28    Procedures Procedures    CRITICAL CARE Performed by: Wandra Arthurs   Total critical care time: 30 minutes  Critical care time was exclusive of separately  billable procedures and treating other patients.  Critical care was necessary to treat or prevent imminent or life-threatening deterioration.  Critical care was time spent personally by me on the following activities: development of treatment plan with patient and/or surrogate as well as nursing, discussions with consultants, evaluation of patient's response to treatment, examination of patient, obtaining history from patient or surrogate, ordering and performing treatments and interventions, ordering and review of laboratory studies, ordering and review of radiographic studies, pulse oximetry and re-evaluation of patient's condition.   Medications Ordered in ED Medications  sodium chloride 0.9 % bolus 1,000 mL (has no administration in time range)  cefTRIAXone (ROCEPHIN) 1 g in sodium chloride 0.9 % 100 mL IVPB (has no administration in time range)  azithromycin (ZITHROMAX) 500 mg in sodium chloride 0.9 % 250 mL IVPB (has no administration in time range)  acetaminophen (TYLENOL) tablet 650 mg (650 mg Oral Given 10/25/21 1404)    ED Course/ Medical Decision Making/ A&P                           Medical Decision Making Samuel Mcconnell is a 88 y.o. male here with weakness, cough, hypoxia. Patient is febrile and  tachycardic. Borderline O2 of 89-90%. Concerned for possible pneumonia vs COVID. Patient anticoagulated with eliquis so I doubt PE. Will get cbc, cmp, lactate, cultures, CXR, UA. Will hydrate and reassess  4:06 PM I reviewed patient's labs and independently interpreted xray. Patient became hypotensive to upper 90s. O2 dropped to 89%. CXR showed pneumonia. Will admit for sepsis, CAP   Problems Addressed: Community acquired pneumonia, unspecified laterality: acute illness or injury Sepsis, due to unspecified organism, unspecified whether acute organ dysfunction present [facility]): acute illness or injury  Amount and/or Complexity of Data Reviewed Labs: ordered. Decision-making details documented in ED Course. Radiology: ordered and independent interpretation performed. Decision-making details documented in ED Course. ECG/medicine tests: ordered and independent interpretation performed. Decision-making details documented in ED Course.  Risk OTC drugs. Decision regarding hospitalization.    Final Clinical Impression(s) / ED Diagnoses Final diagnoses:  None    Rx / DC Orders ED Discharge Orders     None         Drenda Freeze, MD 10/25/21 [phone]

## 2021-10-26 DIAGNOSIS — J690 Pneumonitis due to inhalation of food and vomit: Secondary | ICD-10-CM | POA: Diagnosis not present

## 2021-10-26 LAB — CBC
HCT: 35.1 % — ABNORMAL LOW (ref 39.0–52.0)
Hemoglobin: 11.3 g/dL — ABNORMAL LOW (ref 13.0–17.0)
MCH: 28.6 pg (ref 26.0–34.0)
MCHC: 32.2 g/dL (ref 30.0–36.0)
MCV: 88.9 fL (ref 80.0–100.0)
Platelets: 186 10*3/uL (ref 150–400)
RBC: 3.95 MIL/uL — ABNORMAL LOW (ref 4.22–5.81)
RDW: 14 % (ref 11.5–15.5)
WBC: 7.1 10*3/uL (ref 4.0–10.5)
nRBC: 0 % (ref 0.0–0.2)

## 2021-10-26 LAB — COMPREHENSIVE METABOLIC PANEL
ALT: 48 U/L — ABNORMAL HIGH (ref 0–44)
AST: 73 U/L — ABNORMAL HIGH (ref 15–41)
Albumin: 3.2 g/dL — ABNORMAL LOW (ref 3.5–5.0)
Alkaline Phosphatase: 40 U/L (ref 38–126)
Anion gap: 6 (ref 5–15)
BUN: 25 mg/dL — ABNORMAL HIGH (ref 8–23)
CO2: 26 mmol/L (ref 22–32)
Calcium: 8.4 mg/dL — ABNORMAL LOW (ref 8.9–10.3)
Chloride: 105 mmol/L (ref 98–111)
Creatinine, Ser: 1.53 mg/dL — ABNORMAL HIGH (ref 0.61–1.24)
GFR, Estimated: 42 mL/min — ABNORMAL LOW (ref >60)
Glucose, Bld: 106 mg/dL — ABNORMAL HIGH (ref 70–99)
Potassium: 4.4 mmol/L (ref 3.5–5.1)
Sodium: 137 mmol/L (ref 135–145)
Total Bilirubin: 0.6 mg/dL (ref 0.3–1.2)
Total Protein: 5.7 g/dL — ABNORMAL LOW (ref 6.5–8.1)

## 2021-10-26 LAB — URINE CULTURE: Culture: NO GROWTH

## 2021-10-26 LAB — FERRITIN: Ferritin: 68 ng/mL (ref 24–336)

## 2021-10-26 LAB — C-REACTIVE PROTEIN: CRP: 9.4 mg/dL — ABNORMAL HIGH (ref <1.0)

## 2021-10-26 MED ORDER — NIRMATRELVIR/RITONAVIR (PAXLOVID) TABLET (RENAL DOSING): 2.0000 | ORAL_TABLET | Freq: Two times a day (BID) | ORAL | Status: DC

## 2021-10-26 MED ORDER — FUROSEMIDE 40 MG PO TABS
40.0000 mg | ORAL_TABLET | Freq: Every day | ORAL | Status: DC
  Administered 2021-10-26 – 2021-10-29 (×4): 40 mg via ORAL
  Filled 2021-10-26 (×4): qty 1

## 2021-10-26 MED ORDER — MOLNUPIRAVIR EUA 200MG CAPSULE
4.0000 | ORAL_CAPSULE | Freq: Two times a day (BID) | ORAL | Status: DC
  Administered 2021-10-26 – 2021-10-29 (×7): 800 mg via ORAL
  Filled 2021-10-26 (×2): qty 4

## 2021-10-26 NOTE — Evaluation (Signed)
Clinical/Bedside Swallow Evaluation Patient Details  Name: Samuel Mcconnell MRN: [id number] Date of Birth: [birth year]  Today's Date: 10/26/2021 Time: SLP Start Time (ACUTE ONLY): 45 SLP Stop Time (ACUTE ONLY): 1540 SLP Time Calculation (min) (ACUTE ONLY): 25 min  Past Medical History:  Past Medical History:  Diagnosis Date   Atrial fibrillation ([place])    radiofrequency ablation   Emphysema    Hypertension    Thyroid disease    Past Surgical History:  Past Surgical History:  Procedure Laterality Date   CARDIAC SURGERY     CARDIOVERSION N/A 10/24/2019   Procedure: CARDIOVERSION;  Surgeon: Sanda Klein, MD;  Location: [facility];  Service: Cardiovascular;  Laterality: N/A;   CARDIOVERSION N/A 10/22/2020   Procedure: CARDIOVERSION;  Surgeon: Thayer Headings, MD;  Location: [facility];  Service: Cardiovascular;  Laterality: N/A;   [place] ARTHROSCOPY Left 07/2000   LOOP RECORDER IMPLANT  01/16/2010   [employer] Reveal XT (Dr. Norlene Duel)    RADIOFREQUENCY ABLATION  06/13/2009   SHOULDER ARTHROSCOPY WITH ROTATOR CUFF REPAIR Left 04/1996   TIBIAL TUBERCLERPLASTY  ~ 10/2010   TRANSTHORACIC ECHOCARDIOGRAM  11/19/2011   EF=>55%; mild MR, mild mitral annular calcif; mild TR, normal RSVP; AV mildly sclerotic, mild AV regurg; trace pulm valve regurg    HPI:  Samuel Mcconnell is a 89/M with history of COPD, paroxysmal A-fib, hypertension, hypothyroidism, history of dysphagia and prior pneumonia presented to the ED with weakness and fever on 8/5.  Patient is a very poor historian, reports feeling weaker than usual yesterday, then sustained a fall and was quite unsteady, daughter came to check on him and found him to be febrile and weak with some confusion, history of cough reported for last 2 to 3 days.  ED Course: Fever of 101.3 WBC 7.9, creatinine 1.5, chest x-ray with bibasilar opacities concerning for pneumonia. Patient seen by SLP during april, 2023 admission. MBS complete noting  penetration of thin liquids, avoided using a left head turn and chin tuck.    Assessment / Plan / Recommendation  Clinical Impression  Bedside evaluation revealed what appears to be a functional oropharyngeal swallow with seemingly good airway protection. Patient with intermittent coughing at baseline which continued during po intake but did not appear to worsen or change during or after the swallow. He reports no recent overt difficutly swallowing prior to admission but admits that although he recalls all precautions given to him during last admission (chin tuck, left head turn, no straws), he does not follow them consistently at home. Note that patient currently with PNA and although aspiration may have contributed, SLP favoring relation to Covid as he has been without respiratory issues since discharge in April 2023. Discussed recommendation possiblitiies with patient including continuing current diet with use of precautions as he is realistically able vs repeat instrumental testing to determine current state of dysphagia. He wishes to re-evaluate function before discharge. Will plan for instrumental testing 8/7. SLP Visit Diagnosis: Dysphagia, oropharyngeal phase (R13.12)    Aspiration Risk       Diet Recommendation Regular;Thin liquid   Liquid Administration via: Cup;No straw Medication Administration: Whole meds with liquid Supervision: Patient able to self feed Compensations: Slow rate;Small sips/bites Postural Changes: Seated upright at 90 degrees    Other  Recommendations Oral Care Recommendations: Oral care BID    Recommendations for follow up therapy are one component of a multi-disciplinary discharge planning process, led by the attending physician.  Recommendations may be updated based  on patient status, additional functional criteria and insurance authorization.  Follow up Recommendations  (TBD)      Assistance Recommended at Discharge    Functional Status Assessment     Frequency and Duration            Prognosis        Swallow Study   General HPI: Samuel Mcconnell is a 89/M with history of COPD, paroxysmal A-fib, hypertension, hypothyroidism, history of dysphagia and prior pneumonia presented to the ED with weakness and fever on 8/5.  Patient is a very poor historian, reports feeling weaker than usual yesterday, then sustained a fall and was quite unsteady, daughter came to check on him and found him to be febrile and weak with some confusion, history of cough reported for last 2 to 3 days.  ED Course: Fever of 101.3 WBC 7.9, creatinine 1.5, chest x-ray with bibasilar opacities concerning for pneumonia. Patient seen by SLP during april, 2023 admission. MBS complete noting penetration of thin liquids, avoided using a left head turn and chin tuck. Type of Study: Bedside Swallow Evaluation Previous Swallow Assessment: see HPI-recommended dysphagia 3, thin liquid Diet Prior to this Study: Regular;Thin liquids Temperature Spikes Noted: No Respiratory Status: Room air History of Recent Intubation: No Behavior/Cognition: Alert;Cooperative;Pleasant mood Oral Cavity Assessment: Within Functional Limits Oral Care Completed by SLP: No Oral Cavity - Dentition: Dentures, top;Dentures, bottom Vision: Functional for self-feeding Self-Feeding Abilities: Able to feed self Patient Positioning: Upright in bed Baseline Vocal Quality: Normal Volitional Cough: Strong Volitional Swallow: Able to elicit    Oral/Motor/Sensory Function Overall Oral Motor/Sensory Function: Within functional limits   Ice Chips Ice chips: Not tested   Thin Liquid Thin Liquid: Within functional limits Presentation: Cup;Self Fed;Straw    Nectar Thick Nectar Thick Liquid: Not tested   Honey Thick Honey Thick Liquid: Not tested   Puree Puree: Within functional limits Presentation: Self Fed;Spoon   Solid    Kymere Fullington MA, CCC-SLP  Solid: Within functional limits Presentation: Self Fed       America Sandall Meryl 10/26/2021,3:56 PM

## 2021-10-26 NOTE — Progress Notes (Signed)
PROGRESS NOTE    Samuel Mcconnell  OZH:[id number] DOB: Dec 24, 1927 DOA: 10/25/2021 PCP: Luetta Nutting, DO  Samuel Mcconnell is a 89/M with history of COPD, paroxysmal A-fib, hypertension, hypothyroidism, history of dysphagia and prior pneumonia presented to the ED with weakness and fever on 8/5.  Patient is a very poor historian, reports feeling weaker than usual yesterday, then sustained a fall and was quite unsteady, daughter came to check on him and found him to be febrile and weak with some confusion, history of cough reported for last 2 to 3 days. ED Course: Fever of 101.3 WBC 7.9, creatinine 1.5, chest x-ray with bibasilar opacities concerning for pneumonia   Subjective: Feels much better, some cough noted, fever improving  Assessment and Plan:    Aspiration pneumonia ([place])  -Known history of dysphagia and prior pneumonia -SLP evaluation from April reviewed -Clinically improving, continue IV Unasyn -Repeat SLP evaluation today -Blood cultures negative thus far  SARS COVID-19 infection -Incidentally noted to be positive, clinically do not suspect symptoms are secondary to this, check ferritin, CRP, start Paxlovid X 5 days     PAF (paroxysmal atrial fibrillation) (HCC) -Currently in sinus rhythm, continue amiodarone and Eliquis     Chronic obstructive pulmonary disease (HCC) -Stable no wheezing, continue albuterol as needed and ICS/LABA     Essential hypertension -Stable , will hold ARB today     Hypothyroid -Continue Synthroid     CKD 3a -Creatinine stable at baseline, holding ARB     Chronic diastolic CHF (congestive heart failure) ([place]) -Last echo 2018 with preserved  -Has 1+ peripheral edema, will restart oral Lasix today     DVT prophylaxis: eliquis Code Status: Full Code, I recommended reconsideration of this Family Communication: d/w daughter at bedside yesterday Disposition Plan: Likely home in 1-2days  Consultants:    Procedures:   Antimicrobials:     Objective: Vitals:   10/25/21 2328 10/26/21 0523 10/26/21 0757 10/26/21 0943  BP: (!) 126/59 (!) 144/78  (!) 109/52  Pulse: 69 79  65  Resp: '18 18  16  '$ Temp: 99.9 F (37.7 C) 99.4 F (37.4 C)    TempSrc: Oral Oral    SpO2: 97% 98% 98% 95%  Weight:      Height:        Intake/Output Summary (Last 24 hours) at 10/26/2021 1027 Last data filed at 10/26/2021 0600 Gross per 24 hour  Intake 290.93 ml  Output 550 ml  Net -259.07 ml   Filed Weights   10/25/21 1358 10/25/21 1813  Weight: 82.6 kg 83.3 kg    Examination:  General exam: Pleasant chronically ill elderly male sitting up in bed, AAOx3, no distress CVS: S1-S2, regular rhythm Lungs: Rhonchi at both bases Abdomen: Soft, nontender, bowel sounds present Extremities: 1+ edema at the ankles Skin: No rashes Psychiatry:  Mood & affect appropriate.     Data Reviewed:   CBC: Recent Labs  Lab 10/25/21 1400 10/26/21 0805  WBC 7.9 7.1  NEUTROABS 5.7  --   HGB 12.4* 11.3*  HCT 37.6* 35.1*  MCV 87.6 88.9  PLT 217 629   Basic Metabolic Panel: Recent Labs  Lab 10/25/21 1400 10/26/21 0805  NA 137 137  K 4.6 4.4  CL 104 105  CO2 26 26  GLUCOSE 106* 106*  BUN 23 25*  CREATININE 1.51* 1.53*  CALCIUM 9.0 8.4*   GFR: Estimated Creatinine Clearance: 32.8 mL/min (A) (by C-G formula based on SCr of 1.53 mg/dL (H)). Liver Function Tests: Recent Labs  Lab 10/25/21 1400 10/26/21 0805  AST 41 73*  ALT 27 48*  ALKPHOS 47 40  BILITOT 0.9 0.6  PROT 6.3* 5.7*  ALBUMIN 3.7 3.2*   No results for input(s): "LIPASE", "AMYLASE" in the last 168 hours. No results for input(s): "AMMONIA" in the last 168 hours. Coagulation Profile: No results for input(s): "INR", "PROTIME" in the last 168 hours. Cardiac Enzymes: No results for input(s): "CKTOTAL", "CKMB", "CKMBINDEX", "TROPONINI" in the last 168 hours. BNP (last 3 results) No results for input(s): "PROBNP" in the last 8760 hours. HbA1C: No results for input(s):  "HGBA1C" in the last 72 hours. CBG: No results for input(s): "GLUCAP" in the last 168 hours. Lipid Profile: No results for input(s): "CHOL", "HDL", "LDLCALC", "TRIG", "CHOLHDL", "LDLDIRECT" in the last 72 hours. Thyroid Function Tests: No results for input(s): "TSH", "T4TOTAL", "FREET4", "T3FREE", "THYROIDAB" in the last 72 hours. Anemia Panel: Recent Labs    10/26/21 0805  FERRITIN 68   Urine analysis:    Component Value Date/Time   COLORURINE YELLOW 10/25/2021 1832   APPEARANCEUR CLEAR 10/25/2021 1832   LABSPEC 1.018 10/25/2021 1832   PHURINE 6.0 10/25/2021 1832   GLUCOSEU NEGATIVE 10/25/2021 1832   HGBUR NEGATIVE 10/25/2021 1832   BILIRUBINUR NEGATIVE 10/25/2021 1832   KETONESUR NEGATIVE 10/25/2021 1832   PROTEINUR NEGATIVE 10/25/2021 1832   UROBILINOGEN 0.2 01/16/2010 2200   NITRITE NEGATIVE 10/25/2021 1832   LEUKOCYTESUR NEGATIVE 10/25/2021 1832   Sepsis Labs: '@LABRCNTIP'$ (procalcitonin:4,lacticidven:4)  ) Recent Results (from the past 240 hour(s))  SARS Coronavirus 2 by RT PCR (hospital order, performed in [place] hospital lab) *cepheid single result test* Anterior Nasal Swab     Status: Abnormal   Collection Time: 10/25/21  3:15 PM   Specimen: Anterior Nasal Swab  Result Value Ref Range Status   SARS Coronavirus 2 by RT PCR POSITIVE (A) NEGATIVE Final    Comment: (NOTE) SARS-CoV-2 target nucleic acids are DETECTED  SARS-CoV-2 RNA is generally detectable in upper respiratory specimens  during the acute phase of infection.  Positive results are indicative  of the presence of the identified virus, but do not rule out bacterial infection or co-infection with other pathogens not detected by the test.  Clinical correlation with patient history and  other diagnostic information is necessary to determine patient infection status.  The expected result is negative.  Fact Sheet for Patients:   https://www.patel.info/   Fact Sheet for Healthcare  Providers:   https://hall.com/    This test is not yet approved or cleared by the [place] FDA and  has been authorized for detection and/or diagnosis of SARS-CoV-2 by FDA under an Emergency Use Authorization (EUA).  This EUA will remain in effect (meaning this test can be used) for the duration of  the COVID-19 declaration under Section 564(b)(1)  of the Act, 21 U.S.C. section 360-bbb-3(b)(1), unless the authorization is terminated or revoked sooner.   Performed at [facility], [place] [address], [place], [place] [address]   Blood culture (routine x 2)     Status: None (Preliminary result)   Collection Time: 10/25/21  3:36 PM   Specimen: BLOOD  Result Value Ref Range Status   Specimen Description BLOOD LEFT ANTECUBITAL  Final   Special Requests   Final    BOTTLES DRAWN AEROBIC AND ANAEROBIC Blood Culture results may not be optimal due to an excessive volume of blood received in culture bottles   Culture   Final    NO GROWTH < 24 HOURS Performed  at [facility], [place] [address], [place], [place] [address]    Report Status PENDING  Incomplete  Blood culture (routine x 2)     Status: None (Preliminary result)   Collection Time: 10/25/21  3:41 PM   Specimen: BLOOD RIGHT ARM  Result Value Ref Range Status   Specimen Description BLOOD RIGHT ARM  Final   Special Requests   Final    BOTTLES DRAWN AEROBIC AND ANAEROBIC Blood Culture adequate volume   Culture   Final    NO GROWTH < 24 HOURS Performed at [facility], [place] [address], [place], [place] [address]    Report Status PENDING  Incomplete     Radiology Studies: DG Chest 2 View  Result Date: 10/25/2021 CLINICAL DATA:  Fever, low oxygen saturation. EXAM: CHEST - 2 VIEW COMPARISON:  October 18, 2021 FINDINGS: Tortuosity and calcific atherosclerotic disease of the aorta. Cardiomediastinal silhouette is normal. Mediastinal contours appear intact. Streaky peribronchial airspace opacities in  bilateral lung bases. Osseous structures are without acute abnormality. Soft tissues are grossly normal. IMPRESSION: 1. Streaky peribronchial airspace opacities in bilateral lung bases may represent early or atypical pneumonia. 2. Tortuosity and calcific atherosclerotic disease of the aorta. Electronically Signed   By: Fidela Salisbury M.D.   On: 10/25/2021 14:28     Scheduled Meds:  amiodarone  200 mg Oral Daily   apixaban  2.5 mg Oral BID   levothyroxine  137 mcg Oral Q0600   mometasone-formoterol  2 puff Inhalation BID   nirmatrelvir/ritonavir EUA (renal dosing)  2 tablet Oral BID   Continuous Infusions:  ampicillin-sulbactam (UNASYN) IV 3 g (10/26/21 0532)     LOS: 1 day    Time spent: 65mn  PDomenic Polite MD [facility]   10/26/2021, 10:27 AM

## 2021-10-26 NOTE — Plan of Care (Signed)
  Problem: Nutrition: Goal: Adequate nutrition will be maintained Outcome: Progressing   Problem: Coping: Goal: Level of anxiety will decrease Outcome: Progressing   Problem: Elimination: Goal: Will not experience complications related to urinary retention Outcome: Progressing   Problem: Pain Managment: Goal: General experience of comfort will improve Outcome: Progressing   

## 2021-10-27 DIAGNOSIS — J690 Pneumonitis due to inhalation of food and vomit: Secondary | ICD-10-CM | POA: Diagnosis not present

## 2021-10-27 LAB — CBC
HCT: 34.6 % — ABNORMAL LOW (ref 39.0–52.0)
Hemoglobin: 11.7 g/dL — ABNORMAL LOW (ref 13.0–17.0)
MCH: 29.3 pg (ref 26.0–34.0)
MCHC: 33.8 g/dL (ref 30.0–36.0)
MCV: 86.7 fL (ref 80.0–100.0)
Platelets: 186 10*3/uL (ref 150–400)
RBC: 3.99 MIL/uL — ABNORMAL LOW (ref 4.22–5.81)
RDW: 13.7 % (ref 11.5–15.5)
WBC: 6.9 10*3/uL (ref 4.0–10.5)
nRBC: 0 % (ref 0.0–0.2)

## 2021-10-27 LAB — BASIC METABOLIC PANEL
Anion gap: 7 (ref 5–15)
BUN: 26 mg/dL — ABNORMAL HIGH (ref 8–23)
CO2: 28 mmol/L (ref 22–32)
Calcium: 8.2 mg/dL — ABNORMAL LOW (ref 8.9–10.3)
Chloride: 101 mmol/L (ref 98–111)
Creatinine, Ser: 1.63 mg/dL — ABNORMAL HIGH (ref 0.61–1.24)
GFR, Estimated: 39 mL/min — ABNORMAL LOW (ref >60)
Glucose, Bld: 125 mg/dL — ABNORMAL HIGH (ref 70–99)
Potassium: 3.9 mmol/L (ref 3.5–5.1)
Sodium: 136 mmol/L (ref 135–145)

## 2021-10-27 LAB — C-REACTIVE PROTEIN: CRP: 8.8 mg/dL — ABNORMAL HIGH (ref <1.0)

## 2021-10-27 LAB — FERRITIN: Ferritin: 88 ng/mL (ref 24–336)

## 2021-10-27 MED ORDER — PANTOPRAZOLE SODIUM 40 MG PO TBEC
40.0000 mg | DELAYED_RELEASE_TABLET | Freq: Every day | ORAL | Status: DC
  Administered 2021-10-27 – 2021-10-28 (×2): 40 mg via ORAL
  Filled 2021-10-27 (×2): qty 1

## 2021-10-27 MED ORDER — AMOXICILLIN-POT CLAVULANATE 875-125 MG PO TABS
1.0000 | ORAL_TABLET | Freq: Two times a day (BID) | ORAL | Status: DC
Start: 2021-10-27 — End: 2021-10-29
  Administered 2021-10-27 – 2021-10-29 (×4): 1 via ORAL
  Filled 2021-10-27 (×4): qty 1

## 2021-10-27 MED ORDER — SENNOSIDES-DOCUSATE SODIUM 8.6-50 MG PO TABS
1.0000 | ORAL_TABLET | Freq: Two times a day (BID) | ORAL | Status: DC
  Administered 2021-10-27 – 2021-10-28 (×4): 1 via ORAL
  Filled 2021-10-27 (×6): qty 1

## 2021-10-27 MED ORDER — IPRATROPIUM-ALBUTEROL 0.5-2.5 (3) MG/3ML IN SOLN
3.0000 mL | Freq: Four times a day (QID) | RESPIRATORY_TRACT | Status: DC
  Administered 2021-10-27: 3 mL via RESPIRATORY_TRACT
  Filled 2021-10-27: qty 3

## 2021-10-27 MED ORDER — POLYETHYLENE GLYCOL 3350 17 G PO PACK
17.0000 g | PACK | Freq: Every day | ORAL | Status: DC
  Administered 2021-10-27 – 2021-10-28 (×2): 17 g via ORAL
  Filled 2021-10-27 (×3): qty 1

## 2021-10-27 NOTE — Evaluation (Signed)
Occupational Therapy Evaluation Patient Details Name: Samuel Mcconnell MRN: [id number] DOB: [birth year] Today's Date: 10/27/2021   History of Present Illness Pt is a 88 y/o male admitted secondary to weakness and fever. Found to have PNA, and was Covid +. PMH includes HTN, dCHF, CKD, a fib, COPD.   Clinical Impression   Prior to this admission, patient living at [facility] with wife, walking with a walker, and going to the gym 3x at week. Currently, patient limited by fatigue and decreased activity tolerance, but highly motivated to participate. Patient requiring 2 person hand held assist with initial sit<>stand (no walker in room) but able to progress to one person hand held assist to take steps to recliner from [facility]. OT will follow acutely, but anticipate no need for OT services at discharge.      Recommendations for follow up therapy are one component of a multi-disciplinary discharge planning process, led by the attending physician.  Recommendations may be updated based on patient status, additional functional criteria and insurance authorization.   Follow Up Recommendations  No OT follow up    Assistance Recommended at Discharge Intermittent Supervision/Assistance  Patient can return home with the following A little help with walking and/or transfers;A little help with bathing/dressing/bathroom;Assistance with cooking/housework;Direct supervision/assist for medications management;Direct supervision/assist for financial management;Assist for transportation;Help with stairs or ramp for entrance    Functional Status Assessment  Patient has had a recent decline in their functional status and demonstrates the ability to make significant improvements in function in a reasonable and predictable amount of time.  Equipment Recommendations  None recommended by OT    Recommendations for Other Services       Precautions / Restrictions Precautions Precautions: Fall Restrictions Weight Bearing  Restrictions: No      Mobility Bed Mobility Overal bed mobility: Needs Assistance Bed Mobility: Supine to Sit     Supine to sit: Supervision     General bed mobility comments: supervision for safety.    Transfers Overall transfer level: Needs assistance Equipment used: 2 person hand held assist Transfers: Sit to/from Stand, Bed to chair/wheelchair/BSC Sit to Stand: Min assist, +2 physical assistance Stand pivot transfers: Min assist, +2 physical assistance, +2 safety/equipment         General transfer comment: Required min A +2 for lift assist and steadying to stand and transfer to [facility]. Mild shakiness noted.      Balance Overall balance assessment: Needs assistance Sitting-balance support: No upper extremity supported, Feet supported Sitting balance-Leahy Scale: Good     Standing balance support: Single extremity supported Standing balance-Leahy Scale: Fair                             ADL either performed or assessed with clinical judgement   ADL Overall ADL's : Needs assistance/impaired Eating/Feeding: Set up;Sitting   Grooming: Set up;Sitting   Upper Body Bathing: Sitting;Minimal assistance   Lower Body Bathing: Minimal assistance;Moderate assistance;Sitting/lateral leans;Sit to/from stand   Upper Body Dressing : Set up;Sitting   Lower Body Dressing: Minimal assistance;Moderate assistance;Sitting/lateral leans;Sit to/from stand   Toilet Transfer: Minimal assistance;Cueing for safety;Cueing for sequencing;Stand-pivot;Ambulation;Rolling walker (2 wheels)   Toileting- Clothing Manipulation and Hygiene: Maximal assistance;Sitting/lateral lean;Sit to/from stand Toileting - Clothing Manipulation Details (indicate cue type and reason): required assist for peri care in standing     Functional mobility during ADLs: Minimal assistance;Rolling walker (2 wheels);Cueing for sequencing;Cueing for safety General ADL Comments: Patient presenting with  decreased activity  tolerance and need for increased assist with ADLs     Vision Baseline Vision/History: 1 Wears glasses Ability to See in Adequate Light: 0 Adequate Patient Visual Report: No change from baseline       Perception     Praxis      Pertinent Vitals/Pain Pain Assessment Pain Assessment: Faces Faces Pain Scale: No hurt     Hand Dominance Right   Extremity/Trunk Assessment Upper Extremity Assessment Upper Extremity Assessment: Generalized weakness   Lower Extremity Assessment Lower Extremity Assessment: Defer to PT evaluation   Cervical / Trunk Assessment Cervical / Trunk Assessment: Kyphotic   Communication Communication Communication: HOH   Cognition Arousal/Alertness: Awake/alert Behavior During Therapy: WFL for tasks assessed/performed Overall Cognitive Status: Within Functional Limits for tasks assessed                                       General Comments       Exercises     Shoulder Instructions      Home Living Family/patient expects to be discharged to:: Private residence Living Arrangements: Spouse/significant other Available Help at Discharge: Family Type of Home: Independent living facility Home Access: Level entry     Home Layout: One level     Bathroom Shower/Tub: Occupational psychologist: Standard Bathroom Accessibility: Yes   Home Equipment: Rollator (4 wheels);Shower seat - built in;Grab bars - tub/shower;Grab bars - toilet   Additional Comments: At [facility]      Prior Functioning/Environment Prior Level of Function : Independent/Modified Independent             Mobility Comments: Used rollator for mobility. Went to gym regularly ADLs Comments: daughter double checks meds on occasion; daughter assists with grocery shopping and meal prep, wife has Alzheimers        OT Problem List: Decreased strength;Decreased activity tolerance;Impaired balance (sitting and/or  standing);Cardiopulmonary status limiting activity      OT Treatment/Interventions: Self-care/ADL training;Therapeutic exercise;Energy conservation;DME and/or AE instruction;Manual therapy;Therapeutic activities;Patient/family education;Balance training    OT Goals(Current goals can be found in the care plan section) Acute Rehab OT Goals Patient Stated Goal: to get back to the gym OT Goal Formulation: With patient Time For Goal Achievement: 11/10/21 Potential to Achieve Goals: Good ADL Goals Pt Will Perform Lower Body Bathing: Independently;sit to/from stand;sitting/lateral leans Pt Will Perform Lower Body Dressing: Independently;sitting/lateral leans;sit to/from stand Pt Will Transfer to Toilet: Independently;regular height toilet;grab bars Pt Will Perform Toileting - Clothing Manipulation and hygiene: Independently;sitting/lateral leans;sit to/from stand Additional ADL Goal #1: Patient will be able to engage in functional task in standing for 3-5 minutes without need for seated rest break in order to increase overall activity tolerance.  OT Frequency: Min 2X/week    Co-evaluation PT/OT/SLP Co-Evaluation/Treatment: Yes Reason for Co-Treatment: To address functional/ADL transfers;For patient/therapist safety PT goals addressed during session: Mobility/safety with mobility;Balance OT goals addressed during session: ADL's and self-care;Proper use of Adaptive equipment and DME      AM-PAC OT "6 Clicks" Daily Activity     Outcome Measure Help from another person eating meals?: A Little Help from another person taking care of personal grooming?: A Little Help from another person toileting, which includes using toliet, bedpan, or urinal?: A Lot Help from another person bathing (including washing, rinsing, drying)?: A Little Help from another person to put on and taking off regular upper body clothing?: A Little Help from  another person to put on and taking off regular lower body clothing?:  A Little 6 Click Score: 17   End of Session Equipment Utilized During Treatment: Gait belt Nurse Communication: Mobility status  Activity Tolerance: Patient tolerated treatment well Patient left: in chair;with call bell/phone within reach;with chair alarm set  OT Visit Diagnosis: Unsteadiness on feet (R26.81);Muscle weakness (generalized) (M62.81)                Time: 1030-1051 OT Time Calculation (min): 21 min Charges:  OT General Charges $OT Visit: 1 Visit OT Evaluation $OT Eval Moderate Complexity: 1 Mod  Samuel Mcconnell, OTR/L Acute Rehabilitation Services [phone]   Ascencion Dike 10/27/2021, 1:53 PM

## 2021-10-27 NOTE — Evaluation (Signed)
Physical Therapy Evaluation Patient Details Name: Samuel Mcconnell MRN: [id number] DOB: [birth year] Today's Date: 10/27/2021  History of Present Illness  Pt is a 88 y/o mael admitted secondary to weakness and fever. Found to have PNA, and was Covid +. PMH includes HTN, dCHF, CKD, a fib, COPD.  Clinical Impression  Pt admitted secondary to problem above with deficits below. Pt requiring min A for transfers and short distance ambulation this session. Weakness and unsteadiness noted throughout. Pt normally is mod I and goes to the gym. Reports his family can assist as needed upon d/c. Recommending HHPT at d/c to address current deficits as long as family can provide necessary assist. If family unable to provide necessary assist, recommend SNF level therapies. Will continue to follow acutely.      Recommendations for follow up therapy are one component of a multi-disciplinary discharge planning process, led by the attending physician.  Recommendations may be updated based on patient status, additional functional criteria and insurance authorization.  Follow Up Recommendations Home health PT      Assistance Recommended at Discharge Intermittent Supervision/Assistance  Patient can return home with the following  A little help with bathing/dressing/bathroom;A little help with walking and/or transfers;Assistance with cooking/housework;Help with stairs or ramp for entrance;Assist for transportation    Equipment Recommendations None recommended by PT  Recommendations for Other Services       Functional Status Assessment Patient has had a recent decline in their functional status and demonstrates the ability to make significant improvements in function in a reasonable and predictable amount of time.     Precautions / Restrictions Precautions Precautions: Fall Restrictions Weight Bearing Restrictions: No      Mobility  Bed Mobility Overal bed mobility: Needs Assistance Bed Mobility: Supine to  Sit     Supine to sit: Supervision     General bed mobility comments: supervision for safety.    Transfers Overall transfer level: Needs assistance Equipment used: 2 person hand held assist Transfers: Sit to/from Stand, Bed to chair/wheelchair/BSC Sit to Stand: Min assist, +2 physical assistance Stand pivot transfers: Min assist, +2 physical assistance, +2 safety/equipment         General transfer comment: Required min A +2 for lift assist and steadying to stand and transfer to [facility]. Mild shakiness noted.    Ambulation/Gait Ambulation/Gait assistance: Min assist, +2 safety/equipment Gait Distance (Feet): 3 Feet Assistive device: 1 person hand held assist Gait Pattern/deviations: Step-through pattern, Decreased stride length Gait velocity: decreased     General Gait Details: Min A for steadying to ambulate short distance from [facility] to chair. Min A for steadying.  Stairs            Wheelchair Mobility    Modified Rankin (Stroke Patients Only)       Balance Overall balance assessment: Needs assistance Sitting-balance support: No upper extremity supported, Feet supported Sitting balance-Leahy Scale: Good     Standing balance support: Single extremity supported Standing balance-Leahy Scale: Fair                               Pertinent Vitals/Pain Pain Assessment Pain Assessment: Faces Faces Pain Scale: No hurt    Home Living Family/patient expects to be discharged to:: Private residence Living Arrangements: Spouse/significant other Available Help at Discharge: Family Type of Home: Independent living facility Home Access: Level entry       Home Layout: One level Home Equipment: Rollator (4 wheels);Shower seat - built  in;Grab bars - tub/shower;Grab bars - toilet Additional Comments: At [facility]    Prior Function Prior Level of Function : Independent/Modified Independent             Mobility Comments: Used rollator for mobility.  Went to gym regularly       Hand Dominance        Extremity/Trunk Assessment   Upper Extremity Assessment Upper Extremity Assessment: Defer to OT evaluation    Lower Extremity Assessment Lower Extremity Assessment: Generalized weakness    Cervical / Trunk Assessment Cervical / Trunk Assessment: Kyphotic  Communication   Communication: HOH  Cognition Arousal/Alertness: Awake/alert Behavior During Therapy: WFL for tasks assessed/performed Overall Cognitive Status: Within Functional Limits for tasks assessed                                          General Comments      Exercises     Assessment/Plan    PT Assessment Patient needs continued PT services  PT Problem List Decreased strength;Decreased balance;Decreased activity tolerance;Decreased mobility;Decreased knowledge of use of DME       PT Treatment Interventions DME instruction;Gait training;Functional mobility training;Therapeutic activities;Therapeutic exercise;Balance training;Patient/family education    PT Goals (Current goals can be found in the Care Plan section)  Acute Rehab PT Goals Patient Stated Goal: to go home PT Goal Formulation: With patient Time For Goal Achievement: 11/10/21 Potential to Achieve Goals: Good    Frequency Min 3X/week     Co-evaluation PT/OT/SLP Co-Evaluation/Treatment: Yes Reason for Co-Treatment: For patient/therapist safety;To address functional/ADL transfers PT goals addressed during session: Mobility/safety with mobility;Balance         AM-PAC PT "6 Clicks" Mobility  Outcome Measure Help needed turning from your back to your side while in a flat bed without using bedrails?: None Help needed moving from lying on your back to sitting on the side of a flat bed without using bedrails?: A Little Help needed moving to and from a bed to a chair (including a wheelchair)?: A Little Help needed standing up from a chair using your arms (e.g., wheelchair or  bedside chair)?: A Little Help needed to walk in hospital room?: A Little Help needed climbing 3-5 steps with a railing? : A Lot 6 Click Score: 18    End of Session Equipment Utilized During Treatment: Gait belt Activity Tolerance: Patient tolerated treatment well Patient left: in chair;with call bell/phone within reach;with chair alarm set Nurse Communication: Mobility status PT Visit Diagnosis: Other abnormalities of gait and mobility (R26.89);Muscle weakness (generalized) (M62.81);Unsteadiness on feet (R26.81)    Time: 1030-1051 PT Time Calculation (min) (ACUTE ONLY): 21 min   Charges:   PT Evaluation $PT Eval Moderate Complexity: 1 Mod          Reuel Derby, PT, DPT  Acute Rehabilitation Services  Office: [phone]   Rudean Hitt 10/27/2021, 12:22 PM

## 2021-10-27 NOTE — Progress Notes (Signed)
PROGRESS NOTE    Samuel Mcconnell  ALP:[id number] DOB: 1927-04-06 DOA: 10/25/2021 PCP: Luetta Nutting, DO  Samuel Mcconnell is a 89/M with history of COPD, paroxysmal A-fib, hypertension, hypothyroidism, history of dysphagia and prior pneumonia presented to the ED with weakness and fever on 8/5.  Patient is a very poor historian, reports feeling weaker than usual yesterday, then sustained a fall and was quite unsteady, daughter came to check on him and found him to be febrile and weak with some confusion, history of cough reported for last 2 to 3 days. ED Course: Fever of 101.3 WBC 7.9, creatinine 1.5, chest x-ray with bibasilar opacities concerning for pneumonia  Subjective: -breathing better, c/o abd discomfort and constipation  Assessment and Plan:    Aspiration pneumonia ([place])  -Known history of dysphagia and prior pneumonia -SLP evaluation from April reviewed -Clinically improving, on IV Unasyn, change to Po augmentin today -Repeat SLP evaluation completed, oropharyngeal dysphagia noted, recommended regular diet with thin liquids -Blood cultures negative thus far  SARS COVID-19 infection -Incidentally noted to be positive, clinically do not suspect symptoms are secondary to this, check ferritin, CRP, now on molnupiravir day 2/5     PAF (paroxysmal atrial fibrillation) (HCC) -Currently in sinus rhythm, continue amiodarone and Eliquis     Chronic obstructive pulmonary disease (HCC) -Stable no wheezing, continue albuterol as needed and ICS/LABA     Essential hypertension -Stable , will hold ARB     Hypothyroid -Continue Synthroid     CKD 3a -Creatinine stable at baseline, holding ARB     Chronic diastolic CHF (congestive heart failure) ([place]) -Last echo 2018 with preserved  -Continue oral Lasix     DVT prophylaxis: eliquis Code Status: Full Code, I recommended reconsideration of this Family Communication: no family at bedside, called and updated daughter Hassan Rowan Disposition  Plan: Likely home in 1-2 days  Consultants:    Procedures:   Antimicrobials:    Objective: Vitals:   10/26/21 2118 10/27/21 0633 10/27/21 0831 10/27/21 0854  BP: 137/77 (!) 148/72 114/70   Pulse: 72 66 97   Resp: '19 16 15   '$ Temp: 98.7 F (37.1 C) 98.6 F (37 C) 98 F (36.7 C)   TempSrc:  Oral Oral   SpO2: 98% 96% (!) 85% 98%  Weight:      Height:        Intake/Output Summary (Last 24 hours) at 10/27/2021 1332 Last data filed at 10/27/2021 1100 Gross per 24 hour  Intake 868.89 ml  Output 2550 ml  Net -1681.11 ml   Filed Weights   10/25/21 1358 10/25/21 1813  Weight: 82.6 kg 83.3 kg    Examination:  General exam: Pleasant chronically ill male sitting up in bed, AAOx3, no distress CVS: S1-S2, regular rhythm Lungs: Bilateral scattered rhonchi  Abd: soft, NT, BS present Ext: trace edema, Skin: No rashes Psychiatry:  Mood & affect appropriate.     Data Reviewed:   CBC: Recent Labs  Lab 10/25/21 1400 10/26/21 0805 10/27/21 0914  WBC 7.9 7.1 6.9  NEUTROABS 5.7  --   --   HGB 12.4* 11.3* 11.7*  HCT 37.6* 35.1* 34.6*  MCV 87.6 88.9 86.7  PLT 217 186 353   Basic Metabolic Panel: Recent Labs  Lab 10/25/21 1400 10/26/21 0805 10/27/21 0914  NA 137 137 136  K 4.6 4.4 3.9  CL 104 105 101  CO2 '26 26 28  '$ GLUCOSE 106* 106* 125*  BUN 23 25* 26*  CREATININE 1.51* 1.53* 1.63*  CALCIUM 9.0  8.4* 8.2*   GFR: Estimated Creatinine Clearance: 30.8 mL/min (A) (by C-G formula based on SCr of 1.63 mg/dL (H)). Liver Function Tests: Recent Labs  Lab 10/25/21 1400 10/26/21 0805  AST 41 73*  ALT 27 48*  ALKPHOS 47 40  BILITOT 0.9 0.6  PROT 6.3* 5.7*  ALBUMIN 3.7 3.2*   No results for input(s): "LIPASE", "AMYLASE" in the last 168 hours. No results for input(s): "AMMONIA" in the last 168 hours. Coagulation Profile: No results for input(s): "INR", "PROTIME" in the last 168 hours. Cardiac Enzymes: No results for input(s): "CKTOTAL", "CKMB", "CKMBINDEX",  "TROPONINI" in the last 168 hours. BNP (last 3 results) No results for input(s): "PROBNP" in the last 8760 hours. HbA1C: No results for input(s): "HGBA1C" in the last 72 hours. CBG: No results for input(s): "GLUCAP" in the last 168 hours. Lipid Profile: No results for input(s): "CHOL", "HDL", "LDLCALC", "TRIG", "CHOLHDL", "LDLDIRECT" in the last 72 hours. Thyroid Function Tests: No results for input(s): "TSH", "T4TOTAL", "FREET4", "T3FREE", "THYROIDAB" in the last 72 hours. Anemia Panel: Recent Labs    10/26/21 0805 10/27/21 0914  FERRITIN 68 88   Urine analysis:    Component Value Date/Time   COLORURINE YELLOW 10/25/2021 1832   APPEARANCEUR CLEAR 10/25/2021 1832   LABSPEC 1.018 10/25/2021 1832   PHURINE 6.0 10/25/2021 1832   GLUCOSEU NEGATIVE 10/25/2021 1832   HGBUR NEGATIVE 10/25/2021 1832   BILIRUBINUR NEGATIVE 10/25/2021 1832   KETONESUR NEGATIVE 10/25/2021 1832   PROTEINUR NEGATIVE 10/25/2021 1832   UROBILINOGEN 0.2 01/16/2010 2200   NITRITE NEGATIVE 10/25/2021 1832   LEUKOCYTESUR NEGATIVE 10/25/2021 1832   Sepsis Labs: '@LABRCNTIP'$ (procalcitonin:4,lacticidven:4)  ) Recent Results (from the past 240 hour(s))  Urine Culture     Status: None   Collection Time: 10/25/21  3:15 PM   Specimen: Urine, Clean Catch  Result Value Ref Range Status   Specimen Description URINE, CLEAN CATCH  Final   Special Requests NONE  Final   Culture   Final    NO GROWTH Performed at [facility], [place] [address] [place], [place] [address]    Report Status 10/26/2021 FINAL  Final  SARS Coronavirus 2 by RT PCR (hospital order, performed in [facility] hospital lab) *cepheid single result test* Anterior Nasal Swab     Status: Abnormal   Collection Time: 10/25/21  3:15 PM   Specimen: Anterior Nasal Swab  Result Value Ref Range Status   SARS Coronavirus 2 by RT PCR POSITIVE (A) NEGATIVE Final    Comment: (NOTE) SARS-CoV-2 target nucleic acids are DETECTED  SARS-CoV-2 RNA is  generally detectable in upper respiratory specimens  during the acute phase of infection.  Positive results are indicative  of the presence of the identified virus, but do not rule out bacterial infection or co-infection with other pathogens not detected by the test.  Clinical correlation with patient history and  other diagnostic information is necessary to determine patient infection status.  The expected result is negative.  Fact Sheet for Patients:   https://www.patel.info/   Fact Sheet for Healthcare Providers:   https://hall.com/    This test is not yet approved or cleared by the [place] FDA and  has been authorized for detection and/or diagnosis of SARS-CoV-2 by FDA under an Emergency Use Authorization (EUA).  This EUA will remain in effect (meaning this test can be used) for the duration of  the COVID-19 declaration under Section 564(b)(1)  of the Act, 21 U.S.C. section 360-bbb-3(b)(1), unless the authorization is terminated  or revoked sooner.   Performed at [facility], [place] [address] [place], [place] [address]   Blood culture (routine x 2)     Status: None (Preliminary result)   Collection Time: 10/25/21  3:36 PM   Specimen: BLOOD  Result Value Ref Range Status   Specimen Description BLOOD LEFT ANTECUBITAL  Final   Special Requests   Final    BOTTLES DRAWN AEROBIC AND ANAEROBIC Blood Culture results may not be optimal due to an excessive volume of blood received in culture bottles   Culture   Final    NO GROWTH 2 DAYS Performed at [facility], [place] [address] [place], [place] [address]    Report Status PENDING  Incomplete  Blood culture (routine x 2)     Status: None (Preliminary result)   Collection Time: 10/25/21  3:41 PM   Specimen: BLOOD RIGHT ARM  Result Value Ref Range Status   Specimen Description BLOOD RIGHT ARM  Final   Special Requests   Final    BOTTLES DRAWN AEROBIC AND ANAEROBIC Blood  Culture adequate volume   Culture   Final    NO GROWTH 2 DAYS Performed at [facility], [place] [address], [place], [place] [address]    Report Status PENDING  Incomplete     Radiology Studies: DG Chest 2 View  Result Date: 10/25/2021 CLINICAL DATA:  Fever, low oxygen saturation. EXAM: CHEST - 2 VIEW COMPARISON:  October 18, 2021 FINDINGS: Tortuosity and calcific atherosclerotic disease of the aorta. Cardiomediastinal silhouette is normal. Mediastinal contours appear intact. Streaky peribronchial airspace opacities in bilateral lung bases. Osseous structures are without acute abnormality. Soft tissues are grossly normal. IMPRESSION: 1. Streaky peribronchial airspace opacities in bilateral lung bases may represent early or atypical pneumonia. 2. Tortuosity and calcific atherosclerotic disease of the aorta. Electronically Signed   By: Fidela Salisbury M.D.   On: 10/25/2021 14:28     Scheduled Meds:  amiodarone  200 mg Oral Daily   apixaban  2.5 mg Oral BID   furosemide  40 mg Oral Daily   levothyroxine  137 mcg Oral Q0600   molnupiravir EUA  4 capsule Oral BID   mometasone-formoterol  2 puff Inhalation BID   pantoprazole  40 mg Oral Q1200   polyethylene glycol  17 g Oral Daily   senna-docusate  1 tablet Oral BID   Continuous Infusions:  ampicillin-sulbactam (UNASYN) IV 3 g (10/27/21 0637)     LOS: 2 days    Time spent: 25mn  PDomenic Polite MD [facility]   10/27/2021, 1:32 PM

## 2021-10-27 NOTE — TOC Initial Note (Signed)
Transition of Care [facility]) - Initial/Assessment Note    Patient Details  Name: Samuel Mcconnell MRN: [id number] Date of Birth: April 29, 1927  Transition of Care [facility]) CM/SW Contact:    Tom-Johnson, Renea Ee, RN Phone Number: 10/27/2021, 1:50 PM  Clinical Narrative:                  CM spoke with patient at bedside about needs for post hospital transition. Admitted for Aspiration Pneumonia and found to be Covid+. On oral abx.  From [place] with wife. Has a cane, rollator, w/c and handicapped bathroom. PCP is Luetta Nutting, DO and uses [facility] mail order meds and also [facility] [address].  HHPT recommended. Patient was active with [facility] in April/May and requests resuming services at discharge. Referral sent to [facility] and acceptance voiced. Info on AVS. CM will continue to follow with needs.   Expected Discharge Plan: [place] Barriers to Discharge: Continued Medical Work up   Patient Goals and CMS Choice Patient states their goals for this hospitalization and ongoing recovery are:: To return to his Independent Living at [facility]. CMS Medicare.gov Compare Post Acute Care list provided to:: Patient Choice offered to / list presented to : Patient  Expected Discharge Plan and Services Expected Discharge Plan: [place]   Discharge Planning Services: CM Consult Post Acute Care Choice: [place] arrangements for the past 2 months: [place]                 DME Arranged: N/A DME Agency: NA       HH Arranged: PT HH Agency: [place] Date [place]: 10/27/21 Time [place]: 9417 Representative spoke with at [place]: Tommi Rumps  Prior Living Arrangements/Services Living arrangements for the past 2 months: [place] Lives with:: Spouse Patient language and need for interpreter reviewed:: Yes Do you feel safe going back to the place where  you live?: Yes      Need for Family Participation in Patient Care: Yes (Comment) Care giver support system in place?: Yes (comment)   Criminal Activity/Legal Involvement Pertinent to Current Situation/Hospitalization: No - Comment as needed  Activities of Daily Living Home Assistive Devices/Equipment: Eyeglasses, Hearing aid ADL Screening (condition at time of admission) Patient's cognitive ability adequate to safely complete daily activities?: Yes Is the patient deaf or have difficulty hearing?: Yes Does the patient have difficulty seeing, even when wearing glasses/contacts?: No Does the patient have difficulty concentrating, remembering, or making decisions?: No Patient able to express need for assistance with ADLs?: Yes Does the patient have difficulty dressing or bathing?: No Independently performs ADLs?: Yes (appropriate for developmental age) Does the patient have difficulty walking or climbing stairs?: Yes Weakness of Legs: Both Weakness of Arms/Hands: None  Permission Sought/Granted Permission sought to share information with : Case Manager, Customer service manager, Family Supports Permission granted to share information with : Yes, Verbal Permission Granted              Emotional Assessment Appearance:: Appears stated age Attitude/Demeanor/Rapport: Engaged, Gracious Affect (typically observed): Accepting, Appropriate, Calm, Hopeful, Pleasant Orientation: : Oriented to Self, Oriented to Place, Oriented to  Time, Oriented to Situation Alcohol / Substance Use: Not Applicable Psych Involvement: No (comment)  Admission diagnosis:  Aspiration pneumonia ([place]) [J69.0] Community acquired pneumonia, unspecified laterality [J18.9] Sepsis, due to unspecified organism, unspecified whether acute organ dysfunction present [facility]) [A41.9] Patient Active Problem List  Diagnosis Date Noted   Chronic diastolic CHF (congestive heart failure) ([place]) 10/25/2021   Aspiration  pneumonia ([place]) 07/02/2021   Scalp laceration 02/18/2021   Dry nares 02/18/2021   Restless legs 11/18/2020   Persistent atrial fibrillation ([place]) 10/14/2020   Secondary hypercoagulable state ([place]) 10/14/2020   Well adult exam 05/07/2020   Gait instability 11/26/2019   Hypothyroid 11/26/2019   Atypical atrial flutter ([place])    Fall from slip, trip, or stumble, initial encounter 09/08/2019   Bilateral lower extremity edema 09/08/2019   CSF leak from ear 05/16/2019   Chronic obstructive pulmonary disease ([place]) 09/14/2017   Essential hypertension 09/14/2017   Myringotomy tube status 02/25/2017   LVH (left ventricular hypertrophy) 03/28/2016   Gallbladder calculus without cholecystitis 07/22/2014   Fatty liver 07/22/2014   PAF (paroxysmal atrial fibrillation) ([place]) 03/15/2013   CKD (chronic kidney disease) stage 3, GFR 30-59 ml/min (HCC) 02/21/2013   Allergic rhinitis 06/24/2012   PCP:  Luetta Nutting, DO Pharmacy:   [place], [place] - [address] [place] [address] Phone: [phone] Fax: [phone]  [id number] [address] [place]: [phone]: [phone] [facility] [place] [address]: [phone]: [phone]    Social Determinants of Health (SDOH) Interventions    Readmission Risk Interventions     No data to display

## 2021-10-28 ENCOUNTER — Inpatient Hospital Stay (HOSPITAL_COMMUNITY): Payer: Medicare HMO

## 2021-10-28 DIAGNOSIS — J690 Pneumonitis due to inhalation of food and vomit: Secondary | ICD-10-CM | POA: Diagnosis not present

## 2021-10-28 LAB — CBC
HCT: 35.4 % — ABNORMAL LOW (ref 39.0–52.0)
Hemoglobin: 11.9 g/dL — ABNORMAL LOW (ref 13.0–17.0)
MCH: 28.9 pg (ref 26.0–34.0)
MCHC: 33.6 g/dL (ref 30.0–36.0)
MCV: 85.9 fL (ref 80.0–100.0)
Platelets: 194 10*3/uL (ref 150–400)
RBC: 4.12 MIL/uL — ABNORMAL LOW (ref 4.22–5.81)
RDW: 13.4 % (ref 11.5–15.5)
WBC: 6.4 10*3/uL (ref 4.0–10.5)
nRBC: 0 % (ref 0.0–0.2)

## 2021-10-28 LAB — COMPREHENSIVE METABOLIC PANEL
ALT: 41 U/L (ref 0–44)
AST: 55 U/L — ABNORMAL HIGH (ref 15–41)
Albumin: 2.7 g/dL — ABNORMAL LOW (ref 3.5–5.0)
Alkaline Phosphatase: 37 U/L — ABNORMAL LOW (ref 38–126)
Anion gap: 11 (ref 5–15)
BUN: 30 mg/dL — ABNORMAL HIGH (ref 8–23)
CO2: 26 mmol/L (ref 22–32)
Calcium: 8.2 mg/dL — ABNORMAL LOW (ref 8.9–10.3)
Chloride: 96 mmol/L — ABNORMAL LOW (ref 98–111)
Creatinine, Ser: 1.53 mg/dL — ABNORMAL HIGH (ref 0.61–1.24)
GFR, Estimated: 42 mL/min — ABNORMAL LOW (ref >60)
Glucose, Bld: 111 mg/dL — ABNORMAL HIGH (ref 70–99)
Potassium: 3.6 mmol/L (ref 3.5–5.1)
Sodium: 133 mmol/L — ABNORMAL LOW (ref 135–145)
Total Bilirubin: 0.7 mg/dL (ref 0.3–1.2)
Total Protein: 5.3 g/dL — ABNORMAL LOW (ref 6.5–8.1)

## 2021-10-28 MED ORDER — MELATONIN 3 MG PO TABS
3.0000 mg | ORAL_TABLET | Freq: Every evening | ORAL | Status: DC
  Administered 2021-10-28: 3 mg via ORAL
  Filled 2021-10-28: qty 1

## 2021-10-28 NOTE — Progress Notes (Signed)
Physical Therapy Treatment Patient Details Name: Samuel Mcconnell MRN: [id number] DOB: Jun 18, 1927 Today's Date: 10/28/2021   History of Present Illness Pt is a 88 y/o mael admitted secondary to weakness and fever. Found to have PNA, and was Covid +. PMH includes HTN, dCHF, CKD, a fib, COPD.    PT Comments    Pt progressing well with mobility. Able to amb over 200' with rollator without any loss of balance or gross instability. Continue to recommend return home at DC.    Recommendations for follow up therapy are one component of a multi-disciplinary discharge planning process, led by the attending physician.  Recommendations may be updated based on patient status, additional functional criteria and insurance authorization.  Follow Up Recommendations  Home health PT     Assistance Recommended at Discharge Intermittent Supervision/Assistance  Patient can return home with the following A little help with bathing/dressing/bathroom;A little help with walking and/or transfers;Assistance with cooking/housework;Help with stairs or ramp for entrance;Assist for transportation   Equipment Recommendations  None recommended by PT    Recommendations for Other Services       Precautions / Restrictions Precautions Precautions: Fall Restrictions Weight Bearing Restrictions: No     Mobility  Bed Mobility Overal bed mobility: Modified Independent Bed Mobility: Supine to Sit, Sit to Supine     Supine to sit: Modified independent (Device/Increase time) Sit to supine: Modified independent (Device/Increase time)        Transfers Overall transfer level: Needs assistance Equipment used: Rollator (4 wheels) Transfers: Sit to/from Stand Sit to Stand: Min guard           General transfer comment: Assist for safety    Ambulation/Gait Ambulation/Gait assistance: Supervision Gait Distance (Feet): 200 Feet Assistive device: Rollator (4 wheels) Gait Pattern/deviations: Step-through pattern,  Decreased stride length, Trunk flexed Gait velocity: decreased Gait velocity interpretation: 1.31 - 2.62 ft/sec, indicative of limited community ambulator   General Gait Details: Assist for safety   Stairs             Wheelchair Mobility    Modified Rankin (Stroke Patients Only)       Balance Overall balance assessment: Needs assistance Sitting-balance support: No upper extremity supported, Feet supported Sitting balance-Leahy Scale: Good     Standing balance support: No upper extremity supported Standing balance-Leahy Scale: Fair                              Cognition Arousal/Alertness: Awake/alert Behavior During Therapy: WFL for tasks assessed/performed Overall Cognitive Status: Within Functional Limits for tasks assessed                                          Exercises      General Comments        Pertinent Vitals/Pain Pain Assessment Pain Assessment: No/denies pain    Home Living                          Prior Function            PT Goals (current goals can now be found in the care plan section) Acute Rehab PT Goals Patient Stated Goal: to go home Progress towards PT goals: Progressing toward goals    Frequency    Min 3X/week      PT Plan Current plan  remains appropriate    Co-evaluation              AM-PAC PT "6 Clicks" Mobility   Outcome Measure  Help needed turning from your back to your side while in a flat bed without using bedrails?: None Help needed moving from lying on your back to sitting on the side of a flat bed without using bedrails?: None Help needed moving to and from a bed to a chair (including a wheelchair)?: A Little Help needed standing up from a chair using your arms (e.g., wheelchair or bedside chair)?: A Little Help needed to walk in hospital room?: A Little Help needed climbing 3-5 steps with a railing? : A Little 6 Click Score: 20    End of Session    Activity Tolerance: Patient tolerated treatment well Patient left: with call bell/phone within reach;in bed;with bed alarm set Nurse Communication: Mobility status PT Visit Diagnosis: Other abnormalities of gait and mobility (R26.89);Muscle weakness (generalized) (M62.81);Unsteadiness on feet (R26.81)     Time: 5188-4166 PT Time Calculation (min) (ACUTE ONLY): 21 min  Charges:  $Gait Training: 8-22 mins                     [place] [facility] [place] 10/28/2021, 5:30 PM

## 2021-10-28 NOTE — Progress Notes (Signed)
PROGRESS NOTE    Samuel Mcconnell  WUJ:[id number] DOB: [birth year] DOA: 10/25/2021 PCP: Luetta Nutting, DO  Samuel Mcconnell is a 89/M with history of COPD, paroxysmal A-fib, hypertension, hypothyroidism, history of dysphagia and prior pneumonia presented to the ED with weakness and fever on 8/5.  Patient is a very poor historian, reports feeling weaker than usual yesterday, then sustained a fall and was quite unsteady, daughter came to check on him and found him to be febrile and weak with some confusion, history of cough reported for last 2 to 3 days. ED Course: Fever of 101.3 WBC 7.9, creatinine 1.5, chest x-ray with bibasilar opacities concerning for pneumonia  Subjective: -Feels better, cough is improving, constipation has resolved, still requiring 2 L of O2  Assessment and Plan:    Aspiration pneumonia ([place])  -Known history of dysphagia and prior pneumonia -SLP evaluation from April reviewed -Clinically improving, treated w/ IV Unasyn, now on oral Augmentin -Repeat SLP evaluation completed, oropharyngeal dysphagia noted, recommended regular diet with thin liquids -Blood cultures negative thus far -Discharge planning, wean off O2 today  Acute hypoxic respiratory failure -Secondary to above, attempt to wean off O2  SARS COVID-19 infection -Incidentally noted to be positive, clinically do not suspect symptoms are secondary to this, check ferritin, CRP, now on molnupiravir day 3/5     PAF (paroxysmal atrial fibrillation) (HCC) -Currently in sinus rhythm, continue amiodarone and Eliquis     Chronic obstructive pulmonary disease (HCC) -Stable no wheezing, continue albuterol as needed and ICS/LABA     Essential hypertension -Stable , will hold ARB     Hypothyroid -Continue Synthroid     CKD 3a -Creatinine stable at baseline, holding ARB     Chronic diastolic CHF (congestive heart failure) ([place]) -Last echo 2018 with preserved  -Continue oral Lasix     DVT prophylaxis:  eliquis Code Status: Full Code, I recommended reconsideration of this Family Communication: no family at bedside, called and updated daughter Hassan Rowan Disposition Plan: Likely home later today or tomorrow  Consultants:    Procedures:   Antimicrobials:    Objective: Vitals:   10/28/21 0559 10/28/21 0900 10/28/21 0915 10/28/21 1000  BP: 118/64     Pulse: (!) 55 62 (!) 58 60  Resp: '18 16 16 18  '$ Temp: 98 F (36.7 C)     TempSrc: Oral     SpO2: 94% 95% 92% 92%  Weight:      Height:        Intake/Output Summary (Last 24 hours) at 10/28/2021 1301 Last data filed at 10/28/2021 0800 Gross per 24 hour  Intake 440 ml  Output 2200 ml  Net -1760 ml   Filed Weights   10/25/21 1358 10/25/21 1813  Weight: 82.6 kg 83.3 kg    Examination:  General exam: Elderly chronically ill male sitting up in bed, AAOx3, no distress HEENT: No JVD CVS: S1-S2, regular rhythm Lungs: Improved rhonchi, improving air movement Abdomen: Soft, nontender, bowel sounds present Extremities: Trace edema only  Skin: No rashes Psychiatry:  Mood & affect appropriate.     Data Reviewed:   CBC: Recent Labs  Lab 10/25/21 1400 10/26/21 0805 10/27/21 0914 10/28/21 0618  WBC 7.9 7.1 6.9 6.4  NEUTROABS 5.7  --   --   --   HGB 12.4* 11.3* 11.7* 11.9*  HCT 37.6* 35.1* 34.6* 35.4*  MCV 87.6 88.9 86.7 85.9  PLT 217 186 186 956   Basic Metabolic Panel: Recent Labs  Lab 10/25/21 1400 10/26/21 0805 10/27/21 0914  10/28/21 0618  NA 137 137 136 133*  K 4.6 4.4 3.9 3.6  CL 104 105 101 96*  CO2 '26 26 28 26  '$ GLUCOSE 106* 106* 125* 111*  BUN 23 25* 26* 30*  CREATININE 1.51* 1.53* 1.63* 1.53*  CALCIUM 9.0 8.4* 8.2* 8.2*   GFR: Estimated Creatinine Clearance: 32.8 mL/min (A) (by C-G formula based on SCr of 1.53 mg/dL (H)). Liver Function Tests: Recent Labs  Lab 10/25/21 1400 10/26/21 0805 10/28/21 0618  AST 41 73* 55*  ALT 27 48* 41  ALKPHOS 47 40 37*  BILITOT 0.9 0.6 0.7  PROT 6.3* 5.7* 5.3*   ALBUMIN 3.7 3.2* 2.7*   No results for input(s): "LIPASE", "AMYLASE" in the last 168 hours. No results for input(s): "AMMONIA" in the last 168 hours. Coagulation Profile: No results for input(s): "INR", "PROTIME" in the last 168 hours. Cardiac Enzymes: No results for input(s): "CKTOTAL", "CKMB", "CKMBINDEX", "TROPONINI" in the last 168 hours. BNP (last 3 results) No results for input(s): "PROBNP" in the last 8760 hours. HbA1C: No results for input(s): "HGBA1C" in the last 72 hours. CBG: No results for input(s): "GLUCAP" in the last 168 hours. Lipid Profile: No results for input(s): "CHOL", "HDL", "LDLCALC", "TRIG", "CHOLHDL", "LDLDIRECT" in the last 72 hours. Thyroid Function Tests: No results for input(s): "TSH", "T4TOTAL", "FREET4", "T3FREE", "THYROIDAB" in the last 72 hours. Anemia Panel: Recent Labs    10/26/21 0805 10/27/21 0914  FERRITIN 68 88   Urine analysis:    Component Value Date/Time   COLORURINE YELLOW 10/25/2021 1832   APPEARANCEUR CLEAR 10/25/2021 1832   LABSPEC 1.018 10/25/2021 1832   PHURINE 6.0 10/25/2021 1832   GLUCOSEU NEGATIVE 10/25/2021 1832   HGBUR NEGATIVE 10/25/2021 1832   BILIRUBINUR NEGATIVE 10/25/2021 1832   KETONESUR NEGATIVE 10/25/2021 1832   PROTEINUR NEGATIVE 10/25/2021 1832   UROBILINOGEN 0.2 01/16/2010 2200   NITRITE NEGATIVE 10/25/2021 1832   LEUKOCYTESUR NEGATIVE 10/25/2021 1832   Sepsis Labs: '@LABRCNTIP'$ (procalcitonin:4,lacticidven:4)  ) Recent Results (from the past 240 hour(s))  Urine Culture     Status: None   Collection Time: 10/25/21  3:15 PM   Specimen: Urine, Clean Catch  Result Value Ref Range Status   Specimen Description URINE, CLEAN CATCH  Final   Special Requests NONE  Final   Culture   Final    NO GROWTH Performed at [facility], [place] [address] [place], [place] [address]    Report Status 10/26/2021 FINAL  Final  SARS Coronavirus 2 by RT PCR (hospital order, performed in [facility] hospital lab)  *cepheid single result test* Anterior Nasal Swab     Status: Abnormal   Collection Time: 10/25/21  3:15 PM   Specimen: Anterior Nasal Swab  Result Value Ref Range Status   SARS Coronavirus 2 by RT PCR POSITIVE (A) NEGATIVE Final    Comment: (NOTE) SARS-CoV-2 target nucleic acids are DETECTED  SARS-CoV-2 RNA is generally detectable in upper respiratory specimens  during the acute phase of infection.  Positive results are indicative  of the presence of the identified virus, but do not rule out bacterial infection or co-infection with other pathogens not detected by the test.  Clinical correlation with patient history and  other diagnostic information is necessary to determine patient infection status.  The expected result is negative.  Fact Sheet for Patients:   https://www.patel.info/   Fact Sheet for Healthcare Providers:   https://hall.com/    This test is not yet approved or cleared by the [place] FDA  and  has been authorized for detection and/or diagnosis of SARS-CoV-2 by FDA under an Emergency Use Authorization (EUA).  This EUA will remain in effect (meaning this test can be used) for the duration of  the COVID-19 declaration under Section 564(b)(1)  of the Act, 21 U.S.C. section 360-bbb-3(b)(1), unless the authorization is terminated or revoked sooner.   Performed at [facility], [place] [address] [place], [place] [address]   Blood culture (routine x 2)     Status: None (Preliminary result)   Collection Time: 10/25/21  3:36 PM   Specimen: BLOOD  Result Value Ref Range Status   Specimen Description BLOOD LEFT ANTECUBITAL  Final   Special Requests   Final    BOTTLES DRAWN AEROBIC AND ANAEROBIC Blood Culture results may not be optimal due to an excessive volume of blood received in culture bottles   Culture   Final    NO GROWTH 3 DAYS Performed at [facility], [place] [address], [place], [place] [address]     Report Status PENDING  Incomplete  Blood culture (routine x 2)     Status: None (Preliminary result)   Collection Time: 10/25/21  3:41 PM   Specimen: BLOOD RIGHT ARM  Result Value Ref Range Status   Specimen Description BLOOD RIGHT ARM  Final   Special Requests   Final    BOTTLES DRAWN AEROBIC AND ANAEROBIC Blood Culture adequate volume   Culture   Final    NO GROWTH 3 DAYS Performed at [facility], [place] [address], [place], [place] [address]    Report Status PENDING  Incomplete     Radiology Studies: No results found.   Scheduled Meds:  amiodarone  200 mg Oral Daily   amoxicillin-clavulanate  1 tablet Oral Q12H   apixaban  2.5 mg Oral BID   furosemide  40 mg Oral Daily   levothyroxine  137 mcg Oral Q0600   molnupiravir EUA  4 capsule Oral BID   mometasone-formoterol  2 puff Inhalation BID   pantoprazole  40 mg Oral Q1200   polyethylene glycol  17 g Oral Daily   senna-docusate  1 tablet Oral BID   Continuous Infusions:     LOS: 3 days    Time spent: 32mn  PDomenic Polite MD [facility]   10/28/2021, 1:01 PM

## 2021-10-28 NOTE — Progress Notes (Signed)
Modified Barium Swallow Progress Note  Patient Details  Name: Samuel Mcconnell MRN: [id number] Date of Birth: 1927-03-30  Today's Date: 10/28/2021  Modified Barium Swallow completed.  Full report located under Chart Review in the Imaging Section.  Brief recommendations include the following:  Clinical Impression  Patient presents with mild oral phase dysphagia and mild-moderate pharyngeal phase. SLP compared current MBS to previous one (07/04/21) and during current study, amount of aspiration is reduced. Previous MBS recommended chin tuck and head turn left as this prevented penetration and aspiration. During today's study, SLP tested patient using head neutral, chin tuck and chin tuck and head turn left. When head neutral, he exhibited consistent penetration of thin liquids but without aspiration and with penetrate clearing laryngeal vestibule. With chin tuck posture he did exhibit decreased penetration. Unfortunately, patient was not able to coordinate the chin tuck with head turn left and so he ended up swallowing as he was turning his head to the left, resulting in trace aspiration during or slighlty after the swallow. (silent aspiration). Patient required cues to perform a dry swallow to clear vallecular and pyriform sinus residuals with thin liquid and puree consistencies. Patient does not appear able to consistently perform chin tuck and head turn left and so SLP recommended he focus on small sips and extra/dry swallow after each sip. SLP to s/o at this time but recommend [facility] SLP at his ALF to work on swallow safety and consistency with swallow strategies.   Swallow Evaluation Recommendations       SLP Diet Recommendations: Regular solids;Dysphagia 3 (Mech soft) solids;Thin liquid   Liquid Administration via: Cup;No straw   Medication Administration: Whole meds with puree   Supervision: Patient able to self feed   Compensations: Follow solids with liquid;Multiple dry swallows after each  bite/sip       Oral Care Recommendations: Oral care BID        Sonia Baller, MA, CCC-SLP Speech Therapy

## 2021-10-28 NOTE — Care Management Important Message (Signed)
Important Message  Patient Details  Name: Samuel Mcconnell MRN: [id number] Date of Birth: 11-22-1927   Medicare Important Message Given:  Yes  Patient has a Contact Precaution Order in place will mail to the patient home address.    Antonisha Waskey 10/28/2021, 2:32 PM

## 2021-10-29 ENCOUNTER — Other Ambulatory Visit (HOSPITAL_COMMUNITY): Payer: Self-pay

## 2021-10-29 DIAGNOSIS — J690 Pneumonitis due to inhalation of food and vomit: Secondary | ICD-10-CM | POA: Diagnosis not present

## 2021-10-29 LAB — BASIC METABOLIC PANEL
Anion gap: 10 (ref 5–15)
BUN: 26 mg/dL — ABNORMAL HIGH (ref 8–23)
CO2: 27 mmol/L (ref 22–32)
Calcium: 8.4 mg/dL — ABNORMAL LOW (ref 8.9–10.3)
Chloride: 97 mmol/L — ABNORMAL LOW (ref 98–111)
Creatinine, Ser: 1.33 mg/dL — ABNORMAL HIGH (ref 0.61–1.24)
GFR, Estimated: 50 mL/min — ABNORMAL LOW (ref >60)
Glucose, Bld: 103 mg/dL — ABNORMAL HIGH (ref 70–99)
Potassium: 3.8 mmol/L (ref 3.5–5.1)
Sodium: 134 mmol/L — ABNORMAL LOW (ref 135–145)

## 2021-10-29 MED ORDER — FUROSEMIDE 20 MG PO TABS: 20.0000 mg | ORAL_TABLET | Freq: Every day | ORAL | Status: DC

## 2021-10-29 MED ORDER — AMOXICILLIN-POT CLAVULANATE 875-125 MG PO TABS
1.0000 | ORAL_TABLET | Freq: Two times a day (BID) | ORAL | 0 refills | Status: AC
  Filled 2021-10-29: qty 6, 3d supply, fill #0

## 2021-10-29 NOTE — TOC Transition Note (Signed)
Transition of Care [facility]) - CM/SW Discharge Note   Patient Details  Name: Samuel Mcconnell MRN: [id number] Date of Birth: [birth year]  [facility] CM/SW Contact:  Tom-Johnson, Renea Ee, RN Phone Number: 10/29/2021, 1:27 PM   Clinical Narrative:     Patient is scheduled for discharge today. Home health info on AVS. Denies any other needs. Daughter to transport at discharge. No further TOC needs noted.   Final next level of care: [place] Barriers to Discharge: Barriers Resolved   Patient Goals and CMS Choice Patient states their goals for this hospitalization and ongoing recovery are:: To return to his Independent Living at [facility]. CMS Medicare.gov Compare Post Acute Care list provided to:: Patient Choice offered to / list presented to : Patient  Discharge Placement                Patient to be transferred to facility by: Daughter      Discharge Plan and Services   Discharge Planning Services: CM Consult Post Acute Care Choice: Home Health          DME Arranged: N/A DME Agency: NA       HH Arranged: PT HH Agency: [place] Date [place]: 10/27/21 Time [place]: 3254 Representative spoke with at [place]: Tommi Rumps  Social Determinants of Health ([place]) Interventions     Readmission Risk Interventions     No data to display

## 2021-10-30 ENCOUNTER — Encounter: Payer: Self-pay | Admitting: *Deleted

## 2021-10-30 ENCOUNTER — Telehealth: Payer: Self-pay | Admitting: *Deleted

## 2021-10-30 LAB — CULTURE, BLOOD (ROUTINE X 2)
Culture: NO GROWTH
Culture: NO GROWTH
Special Requests: ADEQUATE

## 2021-10-30 NOTE — Patient Outreach (Signed)
  Care Coordination [facility] Note Transition Care Management Follow-up Telephone Call Date of discharge and from where: 10/29/21 [facility] How have you been since you were released from the hospital? "I didn't sleep very good last night and I am feeling weak, kind of like crap- but overall, everything is under control; I have my daughter and my wife here helping me, so I am doing okay" Any questions or concerns? No  Items Reviewed: Did the pt receive and understand the discharge instructions provided? Yes  Medications obtained and verified? Yes - confirmed have made recommended  medication changes and have obtained and is taking antibiotic as prescribed Other? No  Any new allergies since your discharge? No  Dietary orders reviewed? Yes Do you have support at home? Yes   Home Care and Equipment/Supplies: Were home health services ordered? yes If so, what is the name of the agency? [name]  Has the agency set up a time to come to the patient's home? No- provided phone number to [facility] and reviewed home health order: PT only Were any new equipment or medical supplies ordered?  No What is the name of the medical supply agency? N/A Were you able to get the supplies/equipment? not applicable Do you have any questions related to the use of the equipment or supplies? No N/A  Functional Questionnaire: (I = Independent and D = Dependent) ADLs: I wife and daughter assisting as indicated  Bathing/Dressing- I wife and daughter assisting as indicated  Meal Prep- D  Eating- I  Maintaining continence- I wife and daughter assisting as indicated  Transferring/Ambulation- I using walker; wife and daughter assisting as indicated  Managing Meds- D; daughter manages medications  Follow up appointments reviewed:  PCP Hospital f/u appt confirmed? No  Scheduled to see - on - @ - request sent to facilitate scheduling Specialist Hospital f/u appt confirmed?  N/A  Scheduled to see - on - @ -. Are  transportation arrangements needed? No  If their condition worsens, is the pt aware to call PCP or go to the Emergency Dept.? Yes Was the patient provided with contact information for the PCP's office or ED? Yes Was to pt encouraged to call back with questions or concerns? Yes  SDOH assessments and interventions completed:   Yes  Care Coordination Interventions Activated:  Yes   Care Coordination Interventions:  PCP follow up appointment requested    Encounter Outcome:  Pt. Visit Completed    Oneta Rack, RN, BSN, [place] RN [facility] [phone]: direct office

## 2021-11-05 ENCOUNTER — Telehealth: Payer: Self-pay

## 2021-11-05 NOTE — Telephone Encounter (Signed)
Received call from [employer] [phone]. Samuel Mcconnell is home from the hospital. Dx. Aspiration Pneumonia.   Requesting continuation PT: 1 x 1 week; 2 x 4 week; 1 x 4 weeks.  Currently pt has yellow sputum but it's getting better. Stopped taking Melatonin due to the effects lasting 24+ hours.   Is there another medication he can have to help with chronic sleep difficulty?

## 2021-11-06 ENCOUNTER — Telehealth: Payer: Self-pay

## 2021-11-06 NOTE — Discharge Summary (Signed)
Physician Discharge Summary  Orvis Stann GHW:[id number] DOB: [birth year] DOA: 10/25/2021  PCP: Luetta Nutting, DO  Admit date: 10/25/2021 Discharge date: 10/29/2021  Time spent: 35  minutes  Recommendations for Outpatient Follow-up:  PCP in 1 week Home with home health services Aspiration precautions   Discharge Diagnoses:  Principal Problem:   Aspiration pneumonia ([place]) Dysphagia SARS COVID-19 infection COPD   PAF (paroxysmal atrial fibrillation) (HCC)   Chronic obstructive pulmonary disease ([place])   Essential hypertension   Hypothyroid   CKD (chronic kidney disease) stage 3, GFR 30-59 ml/min (HCC)   Chronic diastolic CHF (congestive heart failure) ([place])   Discharge Condition: Improved  Diet recommendation: Low-sodium, heart healthy  Filed Weights   10/25/21 1358 10/25/21 1813  Weight: 82.6 kg 83.3 kg    History of present illness:   Samuel Mcconnell is a 89/M with history of COPD, paroxysmal A-fib, hypertension, hypothyroidism, history of dysphagia and prior pneumonia presented to the ED with weakness and fever on 8/5.  Patient is a very poor historian, reports feeling weaker than usual yesterday, then sustained a fall and was quite unsteady, daughter came to check on him and found him to be febrile and weak with some confusion, history of cough reported for last 2 to 3 days. ED Course: Fever of 101.3 WBC 7.9, creatinine 1.5, chest x-ray with bibasilar opacities concerning for pneumonia  Hospital Course:   Aspiration pneumonia ([place])  -Known history of dysphagia and prior pneumonia -SLP evaluation from April reviewed -Clinically improving, treated w/ IV Unasyn, now on oral Augmentin -Repeat SLP evaluation completed, oropharyngeal dysphagia noted, recommended regular diet with thin liquids -Blood cultures negative thus far, weaned off O2 -Discharged home with home health services, follow-up with PCP in 1 week   Acute hypoxic respiratory failure -Secondary to above, weaned  off O2  SARS COVID-19 infection -Incidentally noted to be positive, clinically do not suspect symptoms are secondary to this, treated with molnupiravir     PAF (paroxysmal atrial fibrillation) (HCC) -Currently in sinus rhythm, continue amiodarone and Eliquis     Chronic obstructive pulmonary disease (HCC) -Stable no wheezing, continue albuterol as needed and ICS/LABA     Essential hypertension -Stable , will hold ARB     Hypothyroid -Continue Synthroid     CKD 3a -Creatinine stable at baseline, holding ARB     Chronic diastolic CHF (congestive heart failure) ([place]) -Last echo 2018 with preserved  -Continue oral Lasix     Discharge Exam: Vitals:   10/29/21 0514 10/29/21 0922  BP: (!) 147/76 127/65  Pulse: 60 (!) 55  Resp: 18 17  Temp: 97.9 F (36.6 C) (!) 97.3 F (36.3 C)  SpO2: 93% 94%   General exam: Elderly chronically ill male sitting up in bed, AAOx3, no distress HEENT: No JVD CVS: S1-S2, regular rhythm Lungs: Improved rhonchi, improving air movement Abdomen: Soft, nontender, bowel sounds present Extremities: Trace edema only  Skin: No rashes Psychiatry:  Mood & affect appropriate.   Discharge Instructions   Discharge Instructions     Diet - low sodium heart healthy   Complete by: As directed    Increase activity slowly   Complete by: As directed       Allergies as of 10/29/2021       Reactions   Clindamycin/lincomycin Other (See Comments)   Patient "felt weird"   Dabigatran Etexilate Mesylate Other (See Comments)   "felt weird" (name brand Pradaxa)   Doxycycline Other (See Comments)   Dyspepsia  Medication List     STOP taking these medications    losartan 25 MG tablet Commonly known as: COZAAR       TAKE these medications    acetaminophen 500 MG tablet Commonly known as: TYLENOL Take 500 mg by mouth daily as needed for moderate pain.   albuterol 108 (90 Base) MCG/ACT inhaler Commonly known as: VENTOLIN HFA Inhale 2  puffs into the lungs every 6 (six) hours as needed for wheezing or shortness of breath.   AMBULATORY NON FORMULARY MEDICATION Please provide toilet riser/elevator with arms.  Diagnosis: Generalized weakness R53.1   amiodarone 200 MG tablet Commonly known as: PACERONE TAKE 1 TABLET EVERY DAY   ascorbic acid 500 MG tablet Commonly known as: VITAMIN C Take 500 mg by mouth daily with lunch.   diphenhydramine-acetaminophen 25-500 MG Tabs tablet Commonly known as: TYLENOL PM Take 1 tablet by mouth at bedtime as needed (for sleeplessness).   Eliquis 2.5 MG Tabs tablet Generic drug: apixaban TAKE 1 TABLET TWICE DAILY (DOSE CHANGE) What changed: See the new instructions.   fluticasone 50 MCG/ACT nasal spray Commonly known as: FLONASE Place 2 sprays into both nostrils daily. What changed:  when to take this reasons to take this   fluticasone-salmeterol 250-50 MCG/ACT Aepb Commonly known as: ADVAIR INHALE 1 PUFF TWICE DAILY INTO THE LUNGS (NEED MD APPOINTMENT FOR REFILLS) What changed: See the new instructions.   furosemide 20 MG tablet Commonly known as: LASIX Take 1 tablet (20 mg total) by mouth daily. What changed: See the new instructions.   ITCHY EYE DROPS OP Place 1 drop into both eyes daily as needed (itchy eyes).   levothyroxine 137 MCG tablet Commonly known as: SYNTHROID TAKE 1 TABLET  DAILY BEFORE BREAKFAST.   multivitamin capsule Take 1 capsule by mouth daily.   triamcinolone ointment 0.1 % Commonly known as: KENALOG Apply 1 Application topically 2 (two) times daily as needed (irritation).   Vitamin D3 125 MCG (5000 UT) Tabs Take 5,000 Units by mouth daily with lunch.   Voltaren 1 % Gel Generic drug: diclofenac Sodium Apply 2 g topically 4 (four) times daily as needed (pain).   zinc gluconate 50 MG tablet Take 50 mg by mouth daily with lunch.       ASK your doctor about these medications    amoxicillin-clavulanate 875-125 MG tablet Commonly known  as: AUGMENTIN Take 1 tablet by mouth every 12 (twelve) hours for 3 days. Ask about: Should I take this medication?       Allergies  Allergen Reactions   Clindamycin/Lincomycin Other (See Comments)    Patient "felt weird"    Dabigatran Etexilate Mesylate Other (See Comments)    "felt weird" (name brand Pradaxa)   Doxycycline Other (See Comments)    Dyspepsia    Follow-up Information     [facility] [place] Follow up.   Specialty: Home Health Services Why: Someone will call you to schedule first home visit. Contact information: [address] [employer] [place] [address] [phone]                  The results of significant diagnostics from this hospitalization (including imaging, microbiology, ancillary and laboratory) are listed below for reference.    Significant Diagnostic Studies: DG Swallowing Func-Speech Pathology  Result Date: 10/28/2021 Table formatting from the original result was not included. Objective Swallowing Evaluation: Type of Study: MBS-Modified Barium Swallow Study  Patient Details Name: Leslie Langille MRN: [id number] Date of Birth: 16-Oct-1927 Today's Date:  10/28/2021 Time: SLP Start Time (ACUTE ONLY): 1530 -SLP Stop Time (ACUTE ONLY): 1555 SLP Time Calculation (min) (ACUTE ONLY): 25 min Past Medical History: Past Medical History: Diagnosis Date  Atrial fibrillation ([place])   radiofrequency ablation  Emphysema   Hypertension   Thyroid disease  Past Surgical History: Past Surgical History: Procedure Laterality Date  CARDIAC SURGERY    CARDIOVERSION N/A 10/24/2019  Procedure: CARDIOVERSION;  Surgeon: Sanda Klein, MD;  Location: [place];  Service: Cardiovascular;  Laterality: N/A;  CARDIOVERSION N/A 10/22/2020  Procedure: CARDIOVERSION;  Surgeon: Thayer Headings, MD;  Location: [place];  Service: Cardiovascular;  Laterality: N/A;  [place] ARTHROSCOPY Left 07/2000  LOOP RECORDER IMPLANT  01/16/2010  [employer] Reveal XT (Dr. Norlene Duel)   RADIOFREQUENCY ABLATION  06/13/2009  SHOULDER ARTHROSCOPY WITH ROTATOR CUFF REPAIR Left 04/1996  TIBIAL TUBERCLERPLASTY  ~ 10/2010  TRANSTHORACIC ECHOCARDIOGRAM  11/19/2011  EF=>55%; mild MR, mild mitral annular calcif; mild TR, normal RSVP; AV mildly sclerotic, mild AV regurg; trace pulm valve regurg  HPI: Samuel Mcconnell is a 89/M with history of COPD, paroxysmal A-fib, hypertension, hypothyroidism, history of dysphagia and prior pneumonia presented to the ED with weakness and fever on 8/5.  Patient is a very poor historian, reports feeling weaker than usual yesterday, then sustained a fall and was quite unsteady, daughter came to check on him and found him to be febrile and weak with some confusion, history of cough reported for last 2 to 3 days.  ED Course: Fever of 101.3 WBC 7.9, creatinine 1.5, chest x-ray with bibasilar opacities concerning for pneumonia. Patient seen by SLP during april, 2023 admission. MBS complete noting penetration of thin liquids, avoided using a left head turn and chin tuck.  Subjective: pleasant, cooperative  Recommendations for follow up therapy are one component of a multi-disciplinary discharge planning process, led by the attending physician.  Recommendations may be updated based on patient status, additional functional criteria and insurance authorization. Assessment / Plan / Recommendation   10/28/2021   5:23 PM Clinical Impressions Clinical Impression Patient presents with mild oral phase dysphagia and mild-moderate pharyngeal phase. SLP compared current MBS to previous one (07/04/21) and during current study, amount of aspiration is reduced. Previous MBS recommended chin tuck and head turn left as this prevented penetration and aspiration. During today's study, SLP tested patient using head neutral, chin tuck and chin tuck and head turn left. When head neutral, he exhibited consistent penetration of thin liquids but without aspiration and with penetrate clearing laryngeal  vestibule. With chin tuck posture he did exhibit decreased penetration. Unfortunately, patient was not able to coordinate the chin tuck with head turn left and so he ended up swallowing as he was turning his head to the left, resulting in trace aspiration during or slighlty after the swallow. (silent aspiration). Patient required cues to perform a dry swallow to clear vallecular and pyriform sinus residuals with thin liquid and puree consistencies. Patient does not appear able to consistently perform chin tuck and head turn left and so SLP recommended he focus on small sips and extra/dry swallow after each sip. SLP to s/o at this time but recommend [facility] SLP at his ALF to work on swallow safety and consistency with swallow strategies. SLP Visit Diagnosis Dysphagia, oropharyngeal phase (R13.12) Impact on safety and function Mild aspiration risk     10/28/2021   5:23 PM Treatment Recommendations Treatment Recommendations No treatment recommended at this time     10/28/2021  5:32 PM Prognosis Prognosis for Safe Diet Advancement Good   10/28/2021   5:23 PM Diet Recommendations SLP Diet Recommendations Regular solids;Dysphagia 3 (Mech soft) solids;Thin liquid Liquid Administration via Cup;No straw Medication Administration Whole meds with puree Compensations Follow solids with liquid;Multiple dry swallows after each bite/sip     10/28/2021   5:23 PM Other Recommendations Oral Care Recommendations Oral care BID Follow Up Recommendations Home health SLP Assistance recommended at discharge Intermittent Supervision/Assistance Functional Status Assessment Patient has had a recent decline in their functional status and demonstrates the ability to make significant improvements in function in a reasonable and predictable amount of time.   07/04/2021   1:41 PM Frequency and Duration  Speech Therapy Frequency (ACUTE ONLY) min 2x/week Treatment Duration 2 weeks     10/28/2021   5:18 PM Oral Phase Oral Phase Impaired Oral - Thin Cup Premature  spillage Oral - Thin Straw Premature spillage Oral - Puree WFL Oral - Pill [facility]    10/28/2021   5:20 PM Pharyngeal Phase Pharyngeal Phase Impaired Pharyngeal- Thin Cup Reduced airway/laryngeal closure;Penetration/Aspiration during swallow;Pharyngeal residue - valleculae;Pharyngeal residue - pyriform Pharyngeal Material enters airway, remains ABOVE vocal cords then ejected out Pharyngeal- Thin Straw Delayed swallow initiation-vallecula;Trace aspiration;Pharyngeal residue - valleculae;Pharyngeal residue - pyriform;Penetration/Apiration after swallow;Penetration/Aspiration during swallow Pharyngeal Material enters airway, passes BELOW cords without attempt by patient to eject out (silent aspiration);Material enters airway, remains ABOVE vocal cords and not ejected out;Material enters airway, remains ABOVE vocal cords then ejected out Pharyngeal- Puree Delayed swallow initiation-vallecula;Pharyngeal residue - valleculae;Pharyngeal residue - pyriform Pharyngeal- Pill [facility]    10/28/2021   5:21 PM Cervical Esophageal Phase  Cervical Esophageal Phase Impaired Thin Cup Reduced cricopharyngeal relaxation;Prominent cricopharyngeal segment Thin Straw Reduced cricopharyngeal relaxation;Prominent cricopharyngeal segment Puree Reduced cricopharyngeal relaxation;Prominent cricopharyngeal segment Pill Reduced cricopharyngeal relaxation;Prominent cricopharyngeal segment Sonia Baller, MA, CCC-SLP Speech Therapy                     DG Chest 2 View  Result Date: 10/25/2021 CLINICAL DATA:  Fever, low oxygen saturation. EXAM: CHEST - 2 VIEW COMPARISON:  October 18, 2021 FINDINGS: Tortuosity and calcific atherosclerotic disease of the aorta. Cardiomediastinal silhouette is normal. Mediastinal contours appear intact. Streaky peribronchial airspace opacities in bilateral lung bases. Osseous structures are without acute abnormality. Soft tissues are grossly normal. IMPRESSION: 1. Streaky peribronchial airspace opacities in bilateral lung bases  may represent early or atypical pneumonia. 2. Tortuosity and calcific atherosclerotic disease of the aorta. Electronically Signed   By: Fidela Salisbury M.D.   On: 10/25/2021 14:28    Microbiology: No results found for this or any previous visit (from the past 240 hour(s)).   Labs: Basic Metabolic Panel: No results for input(s): "NA", "K", "CL", "CO2", "GLUCOSE", "BUN", "CREATININE", "CALCIUM", "MG", "PHOS" in the last 168 hours. Liver Function Tests: No results for input(s): "AST", "ALT", "ALKPHOS", "BILITOT", "PROT", "ALBUMIN" in the last 168 hours. No results for input(s): "LIPASE", "AMYLASE" in the last 168 hours. No results for input(s): "AMMONIA" in the last 168 hours. CBC: No results for input(s): "WBC", "NEUTROABS", "HGB", "HCT", "MCV", "PLT" in the last 168 hours. Cardiac Enzymes: No results for input(s): "CKTOTAL", "CKMB", "CKMBINDEX", "TROPONINI" in the last 168 hours. BNP: BNP (last 3 results) Recent Labs    07/02/21 1232 07/04/21 0208  BNP 131.1* 182.4*    ProBNP (last 3 results) No results for input(s): "PROBNP" in the last 8760 hours.  CBG: No results for input(s): "GLUCAP" in the last 168 hours.  Signed:  Domenic Polite MD.  [facility] 11/06/2021, 1:37 PM

## 2021-11-06 NOTE — Telephone Encounter (Signed)
Please check in on him today to see how he is feeling.    Thanks!

## 2021-11-06 NOTE — Telephone Encounter (Signed)
Clair Gulling with [name] called to report that patient had a dizzy episode during a session. At the beginning of the session his BP was 100/60 and when he got dizzy it dropped to 85/55. Patient does not have any pitting edema and his weight is stable. Patient was advised to increase his fluid intake.

## 2021-11-06 NOTE — Telephone Encounter (Signed)
Left a message with recommendations.

## 2021-11-07 NOTE — Telephone Encounter (Signed)
Contacted the patient at provider's request. Per patient, feeling a whole lot better than yesterday. Patient did not voiced any other concerns during the call. Patient is looking forward to seeing you at his next appointment.

## 2021-11-12 ENCOUNTER — Encounter: Payer: Self-pay | Admitting: General Practice

## 2021-11-12 ENCOUNTER — Inpatient Hospital Stay: Payer: Medicare HMO | Admitting: Family Medicine

## 2021-11-13 ENCOUNTER — Telehealth: Payer: Self-pay

## 2021-11-13 ENCOUNTER — Encounter: Payer: Self-pay | Admitting: Family Medicine

## 2021-11-13 ENCOUNTER — Ambulatory Visit (INDEPENDENT_AMBULATORY_CARE_PROVIDER_SITE_OTHER): Payer: Medicare HMO | Admitting: Family Medicine

## 2021-11-13 VITALS — BP 114/52 | HR 62 | Temp 98.2°F | Ht 71.0 in | Wt 182.0 lb

## 2021-11-13 DIAGNOSIS — J189 Pneumonia, unspecified organism: Secondary | ICD-10-CM | POA: Diagnosis not present

## 2021-11-13 DIAGNOSIS — I4819 Other persistent atrial fibrillation: Secondary | ICD-10-CM

## 2021-11-13 DIAGNOSIS — I10 Essential (primary) hypertension: Secondary | ICD-10-CM | POA: Diagnosis not present

## 2021-11-13 DIAGNOSIS — J690 Pneumonitis due to inhalation of food and vomit: Secondary | ICD-10-CM | POA: Diagnosis not present

## 2021-11-13 DIAGNOSIS — E039 Hypothyroidism, unspecified: Secondary | ICD-10-CM | POA: Diagnosis not present

## 2021-11-13 NOTE — Assessment & Plan Note (Signed)
Stable with amiodarone at current strength.  He will continue anticoagulation with Eliquis.

## 2021-11-13 NOTE — Assessment & Plan Note (Signed)
Updating TSH.

## 2021-11-13 NOTE — Progress Notes (Signed)
Samuel Mcconnell - 88 y.o. male MRN [id number]  Date of birth: June 28, 1927  Subjective Chief Complaint  Patient presents with   Hospitalization Follow-up    HPI Samuel Mcconnell is a 88 year old male here today for hospital follow-up.  Hospitalized earlier this month with pneumonia.  Consistent with aspiration pneumonia.  Incidentally he did also test positive for COVID.  SLP evaluation completed with oropharyngeal dysphagia noted.  Aspiration precautions discussed including medications in pure, avoidance of straws and dry swallows after swallowing.  Clinically improved with Unasyn and transition to Augmentin prior to discharge.  He has completed this course.  Denies any continued dyspnea.  He has not had fever or chills.  He is continue to follow aspiration precautions most of the time.  He has had home health coming out for physical therapy.  Overall doing well with this.  He has had a couple episodes where his blood pressure has dropped some.  He is having some mild swelling in his lower extremities.  He does have compression stockings but is not wearing regularly.  ROS:  A comprehensive ROS was completed and negative except as noted per HPI  Allergies  Allergen Reactions   Clindamycin/Lincomycin Other (See Comments)    Patient "felt weird"    Dabigatran Etexilate Mesylate Other (See Comments)    "felt weird" (name brand Pradaxa)   Doxycycline Other (See Comments)    Dyspepsia    Past Medical History:  Diagnosis Date   Atrial fibrillation ([place])    radiofrequency ablation   Emphysema    Hypertension    Thyroid disease     Past Surgical History:  Procedure Laterality Date   CARDIAC SURGERY     CARDIOVERSION N/A 10/24/2019   Procedure: CARDIOVERSION;  Surgeon: Sanda Klein, MD;  Location: [facility];  Service: Cardiovascular;  Laterality: N/A;   CARDIOVERSION N/A 10/22/2020   Procedure: CARDIOVERSION;  Surgeon: Thayer Headings, MD;  Location: [facility];  Service: Cardiovascular;   Laterality: N/A;   [place] ARTHROSCOPY Left 07/2000   LOOP RECORDER IMPLANT  01/16/2010   Medtronic Reveal XT (Dr. Norlene Duel)    RADIOFREQUENCY ABLATION  06/13/2009   SHOULDER ARTHROSCOPY WITH ROTATOR CUFF REPAIR Left 04/1996   TIBIAL TUBERCLERPLASTY  ~ 10/2010   TRANSTHORACIC ECHOCARDIOGRAM  11/19/2011   EF=>55%; mild MR, mild mitral annular calcif; mild TR, normal RSVP; AV mildly sclerotic, mild AV regurg; trace pulm valve regurg     Social History   Socioeconomic History   Marital status: Married    Spouse name: Barnett Applebaum   Number of children: 2   Years of education: 16   Highest education level: Bachelor's degree (e.g., BA, AB, BS)  Occupational History   Occupation: Retired  Tobacco Use   Smoking status: Former    Years: 46.00    Types: Cigarettes    Quit date: 03/14/2002    Years since quitting: 19.6   Smokeless tobacco: Never   Tobacco comments:    Former smoker 01/23/2021  Vaping Use   Vaping Use: Never used  Substance and Sexual Activity   Alcohol use: No   Drug use: No   Sexual activity: Not Currently    Partners: Female  Other Topics Concern   Not on file  Social History Narrative   Lives at [facility] with his wife. He is still driving. He enjoys golf and gardening (when is he is able to).   Social Determinants of Health   Financial Resource Strain: Low Risk  (  09/16/2020)   Overall Financial Resource Strain (CARDIA)    Difficulty of Paying Living Expenses: Not hard at all  Food Insecurity: No Food Insecurity (10/30/2021)   Hunger Vital Sign    Worried About Running Out of Food in the Last Year: Never true    Ran Out of Food in the Last Year: Never true  Transportation Needs: No Transportation Needs (10/30/2021)   PRAPARE - Hydrologist (Medical): No    Lack of Transportation (Non-Medical): No  Physical Activity: Sufficiently Active (09/16/2020)   Exercise Vital Sign    Days of Exercise per Week: 4 days     Minutes of Exercise per Session: 50 min  Stress: No Stress Concern Present (09/16/2020)   [place]    Feeling of Stress : Not at all  Social Connections: [place] (09/16/2020)   Social Connection and Isolation Panel [NHANES]    Frequency of Communication with Friends and Family: More than three times a week    Frequency of Social Gatherings with Friends and Family: More than three times a week    Attends Religious Services: More than 4 times per year    Active Member of Clubs or Organizations: Yes    Attends Archivist Meetings: Never    Marital Status: Married    Family History  Problem Relation Age of Onset   Heart failure Father        MI - died @ 18   Heart failure Mother    Diabetes Son    Pancreatitis Son        also ETOH abuse    Cancer Maternal Grandmother    Heart disease Maternal Grandfather    Atrial fibrillation Brother        dx'ed age 3   Atrial fibrillation Brother        dx'ed age 50   Heart failure Brother    Atrial fibrillation Sister    Hypertension Sister    Heart Problems Sister    Diabetes Sister    Heart failure Sister     Health Maintenance  Topic Date Due   COVID-19 Vaccine (6 - Pfizer risk series) 05/16/2021   INFLUENZA VACCINE  10/21/2021   TETANUS/TDAP  09/06/2029   Pneumonia Vaccine 65+ Years old  Completed   Zoster Vaccines- Shingrix  Completed   HPV VACCINES  Aged Out     ----------------------------------------------------------------------------------------------------------------------------------------------------------------------------------------------------------------- Physical Exam BP (!) 114/52 (BP Location: Left Arm, Patient Position: Sitting, Cuff Size: Normal)   Pulse 62   Temp 98.2 F (36.8 C) (Oral)   Ht '5\' 11"'$  (1.803 m)   Wt 182 lb 0.6 oz (82.6 kg)   SpO2 100%   BMI 25.39 kg/m   Physical Exam Constitutional:       Appearance: Normal appearance.  Eyes:     General: No scleral icterus. Cardiovascular:     Rate and Rhythm: Normal rate and regular rhythm.  Pulmonary:     Effort: Pulmonary effort is normal.     Breath sounds: Normal breath sounds.  Musculoskeletal:     Cervical back: Neck supple.  Neurological:     Mental Status: He is alert.  Psychiatric:        Mood and Affect: Mood normal.        Behavior: Behavior normal.     ------------------------------------------------------------------------------------------------------------------------------------------------------------------------------------------------------------------- Assessment and Plan  Persistent atrial fibrillation (HCC) Stable with amiodarone at current strength.  He will continue anticoagulation with  Eliquis.  Essential hypertension Blood pressure stable at this time.  He did have a couple episodes of hypotension when standing with physical therapy.  Encouraged to wear compression stockings and stand slowly.  Hypothyroid Updating TSH.  Aspiration pneumonia ([place]) Continue swallowing precautions.  He has visit with me again in a couple weeks.  We will plan to update chest x-ray at that time.   No orders of the defined types were placed in this encounter.   No follow-ups on file.    This visit occurred during the SARS-CoV-2 public health emergency.  Safety protocols were in place, including screening questions prior to the visit, additional usage of staff PPE, and extensive cleaning of exam room while observing appropriate contact time as indicated for disinfecting solutions.

## 2021-11-13 NOTE — Assessment & Plan Note (Signed)
Continue swallowing precautions.  He has visit with me again in a couple weeks.  We will plan to update chest x-ray at that time.

## 2021-11-13 NOTE — Telephone Encounter (Signed)
Physical therapist - Herbert Deaner left a vm msg for provider regarding a home visit with patient on 11/11/21. He mentioned that patient systolic kept dropping when patient was moving around. PT Clair Gulling wants provider to be aware that patient has a scab on his left 1st and second toes. Since patient has CHF, he was wearing his compression stockings.

## 2021-11-13 NOTE — Assessment & Plan Note (Signed)
Blood pressure stable at this time.  He did have a couple episodes of hypotension when standing with physical therapy.  Encouraged to wear compression stockings and stand slowly.

## 2021-11-14 LAB — BASIC METABOLIC PANEL
BUN/Creatinine Ratio: 14 (calc) (ref 6–22)
BUN: 22 mg/dL (ref 7–25)
CO2: 29 mmol/L (ref 20–32)
Calcium: 9 mg/dL (ref 8.6–10.3)
Chloride: 104 mmol/L (ref 98–110)
Creat: 1.53 mg/dL — ABNORMAL HIGH (ref 0.70–1.22)
Glucose, Bld: 99 mg/dL (ref 65–99)
Potassium: 5 mmol/L (ref 3.5–5.3)
Sodium: 139 mmol/L (ref 135–146)

## 2021-11-14 LAB — CBC WITH DIFFERENTIAL/PLATELET
Absolute Monocytes: 699 cells/uL (ref 200–950)
Basophils Absolute: 38 cells/uL (ref 0–200)
Basophils Relative: 0.5 %
Eosinophils Absolute: 76 cells/uL (ref 15–500)
Eosinophils Relative: 1 %
HCT: 37.4 % — ABNORMAL LOW (ref 38.5–50.0)
Hemoglobin: 11.8 g/dL — ABNORMAL LOW (ref 13.2–17.1)
Lymphs Abs: 1611 cells/uL (ref 850–3900)
MCH: 29.4 pg (ref 27.0–33.0)
MCHC: 31.6 g/dL — ABNORMAL LOW (ref 32.0–36.0)
MCV: 93.3 fL (ref 80.0–100.0)
MPV: 11 fL (ref 7.5–12.5)
Monocytes Relative: 9.2 %
Neutro Abs: 5176 cells/uL (ref 1500–7800)
Neutrophils Relative %: 68.1 %
Platelets: 288 10*3/uL (ref 140–400)
RBC: 4.01 10*6/uL — ABNORMAL LOW (ref 4.20–5.80)
RDW: 15 % (ref 11.0–15.0)
Total Lymphocyte: 21.2 %
WBC: 7.6 10*3/uL (ref 3.8–10.8)

## 2021-11-14 LAB — TSH: TSH: 2.01 mIU/L (ref 0.40–4.50)

## 2021-11-17 DIAGNOSIS — Z0100 Encounter for examination of eyes and vision without abnormal findings: Secondary | ICD-10-CM | POA: Diagnosis not present

## 2021-11-17 DIAGNOSIS — H524 Presbyopia: Secondary | ICD-10-CM | POA: Diagnosis not present

## 2021-11-19 ENCOUNTER — Ambulatory Visit (INDEPENDENT_AMBULATORY_CARE_PROVIDER_SITE_OTHER): Payer: Medicare HMO | Admitting: Family Medicine

## 2021-11-19 DIAGNOSIS — Z0000 Encounter for general adult medical examination without abnormal findings: Secondary | ICD-10-CM

## 2021-11-19 NOTE — Progress Notes (Signed)
MEDICARE ANNUAL WELLNESS VISIT  11/19/2021  Telephone Visit Disclaimer This Medicare AWV was conducted by telephone due to national recommendations for restrictions regarding the COVID-19 Pandemic (e.g. social distancing).  I verified, using two identifiers, that I am speaking with Samuel Mcconnell or their authorized healthcare agent. I discussed the limitations, risks, security, and privacy concerns of performing an evaluation and management service by telephone and the potential availability of an in-person appointment in the future. The patient expressed understanding and agreed to proceed.  Location of Patient: Home Location of Provider (nurse):  In the office.  Subjective:    Samuel Mcconnell is a 88 y.o. male patient of Samuel Nutting, DO who had a Medicare Annual Wellness Visit today via telephone. Samuel Mcconnell is Retired and lives with their spouse at the independent living facility. he has 2 children. he reports that he is socially active and does interact with friends/family regularly. he is moderately physically active and enjoys playing golf when he can.  Patient Care Team: Samuel Nutting, DO as PCP - General (Family Medicine) Sanda Klein, MD as PCP - Cardiology (Cardiology) Darius Bump, [facility] as Pharmacist (Pharmacist)     11/19/2021   11:11 AM 10/25/2021    6:10 PM 07/03/2021    8:53 AM 09/16/2020   10:12 AM 11/22/2019    2:03 PM  Advanced Directives  Does Patient Have a Medical Advance Directive? Yes Yes Yes Yes Yes  Type of Advance Directive Living will Healthcare Power of [place];Living will Living will;Healthcare Power of Attorney Living will;Healthcare Power of Attorney  Does patient want to make changes to medical advance directive? No - Patient declined No - Patient declined No - Patient declined No - Patient declined No - Patient declined  Copy of [place] in Chart? No - copy requested No - copy requested No - copy  requested No - copy requested     Hospital Utilization Over the Past 12 Months: # of hospitalizations or ER visits: 2 # of surgeries: 0  Review of Systems    Patient reports that his overall health is better compared to last year.  History obtained from chart review and the patient  Patient Reported Readings (BP, Pulse, CBG, Weight, etc) none  Pain Assessment Pain : No/denies pain Pain Score: 0-No pain     Current Medications & Allergies (verified) Allergies as of 11/19/2021       Reactions   Clindamycin/lincomycin Other (See Comments)   Patient "felt weird"   Dabigatran Etexilate Mesylate Other (See Comments)   "felt weird" (name brand Pradaxa)   Doxycycline Other (See Comments)   Dyspepsia        Medication List        Accurate as of November 19, 2021 11:21 AM. If you have any questions, ask your nurse or doctor.          acetaminophen 500 MG tablet Commonly known as: TYLENOL Take 500 mg by mouth daily as needed for moderate pain.   albuterol 108 (90 Base) MCG/ACT inhaler Commonly known as: VENTOLIN HFA Inhale 2 puffs into the lungs every 6 (six) hours as needed for wheezing or shortness of breath.   AMBULATORY NON FORMULARY MEDICATION Please provide toilet riser/elevator with arms.  Diagnosis: Generalized weakness R53.1   amiodarone 200 MG tablet Commonly known as: PACERONE TAKE 1 TABLET EVERY DAY   ascorbic acid 500 MG tablet Commonly known as: VITAMIN C Take 500 mg by mouth daily with lunch.  diphenhydramine-acetaminophen 25-500 MG Tabs tablet Commonly known as: TYLENOL PM Take 1 tablet by mouth at bedtime as needed (for sleeplessness).   Eliquis 2.5 MG Tabs tablet Generic drug: apixaban TAKE 1 TABLET TWICE DAILY (DOSE CHANGE) What changed: See the new instructions.   fluticasone 50 MCG/ACT nasal spray Commonly known as: FLONASE Place 2 sprays into both nostrils daily. What changed:  when to take this reasons to take this    fluticasone-salmeterol 250-50 MCG/ACT Aepb Commonly known as: ADVAIR INHALE 1 PUFF TWICE DAILY INTO THE LUNGS (NEED MD APPOINTMENT FOR REFILLS) What changed: See the new instructions.   furosemide 20 MG tablet Commonly known as: LASIX Take 1 tablet (20 mg total) by mouth daily.   ITCHY EYE DROPS OP Place 1 drop into both eyes daily as needed (itchy eyes).   levothyroxine 137 MCG tablet Commonly known as: SYNTHROID TAKE 1 TABLET  DAILY BEFORE BREAKFAST.   multivitamin capsule Take 1 capsule by mouth daily.   triamcinolone ointment 0.1 % Commonly known as: KENALOG Apply 1 Application topically 2 (two) times daily as needed (irritation).   Vitamin D3 125 MCG (5000 UT) Tabs Take 5,000 Units by mouth daily with lunch.   Voltaren 1 % Gel Generic drug: diclofenac Sodium Apply 2 g topically 4 (four) times daily as needed (pain).   zinc gluconate 50 MG tablet Take 50 mg by mouth daily with lunch.        History (reviewed): Past Medical History:  Diagnosis Date   Atrial fibrillation ([place])    radiofrequency ablation   Emphysema    Hypertension    Thyroid disease    Past Surgical History:  Procedure Laterality Date   CARDIAC SURGERY     CARDIOVERSION N/A 10/24/2019   Procedure: CARDIOVERSION;  Surgeon: Sanda Klein, MD;  Location: [place];  Service: Cardiovascular;  Laterality: N/A;   CARDIOVERSION N/A 10/22/2020   Procedure: CARDIOVERSION;  Surgeon: Thayer Headings, MD;  Location: [facility] [place];  Service: Cardiovascular;  Laterality: N/A;   [place] ARTHROSCOPY Left 07/2000   LOOP RECORDER IMPLANT  01/16/2010   Medtronic Reveal XT (Dr. Norlene Duel)    RADIOFREQUENCY ABLATION  06/13/2009   SHOULDER ARTHROSCOPY WITH ROTATOR CUFF REPAIR Left 04/1996   TIBIAL TUBERCLERPLASTY  ~ 10/2010   TRANSTHORACIC ECHOCARDIOGRAM  11/19/2011   EF=>55%; mild MR, mild mitral annular calcif; mild TR, normal RSVP; AV mildly sclerotic, mild AV regurg; trace pulm valve  regurg    Family History  Problem Relation Age of Onset   Heart failure Father        MI - died @ 68   Heart failure Mother    Diabetes Son    Pancreatitis Son        also ETOH abuse    Cancer Maternal Grandmother    Heart disease Maternal Grandfather    Atrial fibrillation Brother        dx'ed age 13   Atrial fibrillation Brother        dx'ed age 80   Heart failure Brother    Atrial fibrillation Sister    Hypertension Sister    Heart Problems Sister    Diabetes Sister    Heart failure Sister    Social History   Socioeconomic History   Marital status: Married    Spouse name: Barnett Applebaum   Number of children: 2   Years of education: 16   Highest education level: Bachelor's degree (e.g., BA, AB, BS)  Occupational History  Occupation: Retired  Tobacco Use   Smoking status: Former    Years: 46.00    Types: Cigarettes    Quit date: 03/14/2002    Years since quitting: 19.6   Smokeless tobacco: Never   Tobacco comments:    Former smoker 01/23/2021  Vaping Use   Vaping Use: Never used  Substance and Sexual Activity   Alcohol use: No   Drug use: No   Sexual activity: Not Currently    Partners: Female  Other Topics Concern   Not on file  Social History Narrative   Lives at [facility] with his wife. He is still driving. He enjoys golf and gardening (when is he is able to).   Social Determinants of Health   Financial Resource Strain: Low Risk  (11/19/2021)   Overall Financial Resource Strain (CARDIA)    Difficulty of Paying Living Expenses: Not hard at all  Food Insecurity: No Food Insecurity (11/19/2021)   Hunger Vital Sign    Worried About Running Out of Food in the Last Year: Never true    Ran Out of Food in the Last Year: Never true  Transportation Needs: No Transportation Needs (11/19/2021)   PRAPARE - Hydrologist (Medical): No    Lack of Transportation (Non-Medical): No  Physical Activity: Sufficiently Active (11/19/2021)    Exercise Vital Sign    Days of Exercise per Week: 3 days    Minutes of Exercise per Session: 50 min  Stress: No Stress Concern Present (11/19/2021)   [place]    Feeling of Stress : Not at all  Social Connections: Moderately Isolated (11/19/2021)   Social Connection and Isolation Panel [NHANES]    Frequency of Communication with Friends and Family: More than three times a week    Frequency of Social Gatherings with Friends and Family: More than three times a week    Attends Religious Services: Never    Marine scientist or Organizations: No    Attends Archivist Meetings: Never    Marital Status: Married    Activities of Daily Living    11/19/2021   11:07 AM 10/25/2021    6:10 PM  In your present state of health, do you have any difficulty performing the following activities:  Hearing? 1 1  Comment bilateral hearing aids.   Vision? 0 0  Difficulty concentrating or making decisions? 0 0  Walking or climbing stairs? 1 1  Comment uses the walker; does not climb the stairs.   Dressing or bathing? 0 0  Doing errands, shopping? 1 1  Comment usually goes with his daughter.   Preparing Food and eating ? N   Using the Toilet? N   In the past six months, have you accidently leaked urine? N   Do you have problems with loss of bowel control? N   Managing your Medications? N   Managing your Finances? N   Housekeeping or managing your Housekeeping? N     Patient Education/ Literacy How often do you need to have someone help you when you read instructions, pamphlets, or other written materials from your doctor or pharmacy?: 1 - Never What is the last grade level you completed in school?: Bachelor's degree  Exercise Current Exercise Habits: Home exercise routine, Type of exercise: Other - see comments;strength training/weights (stationary bike), Time (Minutes): 45, Frequency (Times/Week): 3, Weekly Exercise  (Minutes/Week): 135, Intensity: Moderate, Exercise limited by: None identified  Diet  Patient reports consuming 3 meals a day and 0 snack(s) a day Patient reports that his primary diet is: Regular Patient reports that she does have regular access to food.   Depression Screen    11/19/2021   11:12 AM 07/24/2021   11:12 AM 05/21/2021    1:35 PM 11/18/2020    1:16 PM 09/16/2020   10:08 AM 05/07/2020   11:01 AM 11/22/2019    2:02 PM  PHQ 2/9 Scores  PHQ - 2 Score 0 0 2 0 0 0 0  PHQ- 9 Score       2     Fall Risk    11/19/2021   11:12 AM 07/24/2021   11:12 AM 05/21/2021    1:35 PM 11/18/2020    1:16 PM 09/16/2020   10:07 AM  Fall Risk   Falls in the past year? '1 1 1 '$ 0 1  Number falls in past yr: 1 0 0 0 1  Injury with Fall? '1 1 1 '$ 0 1  Risk for fall due to : History of fall(s);Impaired balance/gait;Impaired mobility History of fall(s) History of fall(s);Impaired mobility;Impaired balance/gait Impaired mobility;History of fall(s) Impaired balance/gait  Follow up Falls evaluation completed;Education provided;Falls prevention discussed Falls evaluation completed Falls evaluation completed Falls evaluation completed Falls evaluation completed;Education provided;Falls prevention discussed     Objective:  Jayin Derousse seemed alert and oriented and he participated appropriately during our telephone visit.  Blood Pressure Weight BMI  BP Readings from Last 3 Encounters:  11/13/21 (!) 114/52  10/29/21 127/65  07/24/21 136/65   Wt Readings from Last 3 Encounters:  11/13/21 182 lb 0.6 oz (82.6 kg)  10/25/21 183 lb 10.3 oz (83.3 kg)  07/24/21 180 lb (81.6 kg)   BMI Readings from Last 1 Encounters:  11/13/21 25.39 kg/m    *Unable to obtain current vital signs, weight, and BMI due to telephone visit type  Hearing/Vision  Danne Baxter did not seem to have difficulty with hearing/understanding during the telephone conversation Reports that he has had a formal eye exam by an eye care professional within  the past year Reports that he has had a formal hearing evaluation within the past year *Unable to fully assess hearing and vision during telephone visit type  Cognitive Function:    11/19/2021   11:16 AM 09/16/2020   10:20 AM  6CIT Screen  What Year? 0 points 0 points  What month? 0 points 0 points  What time? 0 points 0 points  Count back from 20 0 points 0 points  Months in reverse 0 points 0 points  Repeat phrase 2 points 0 points  Total Score 2 points 0 points   (Normal:0-7, Significant for Dysfunction: >8)  Normal Cognitive Function Screening: Yes   Immunization & Health Maintenance Record Immunization History  Administered Date(s) Administered   Fluad Quad(high Dose 65+) 01/22/2020, 11/18/2020   Influenza Split 12/25/2010   Influenza, High Dose Seasonal PF 01/14/2015, 12/31/2015, 12/24/2016, 01/21/2018, 01/21/2018, 12/14/2018   Influenza, Seasonal, Injecte, Preservative Fre 01/02/2014   Influenza-Unspecified 12/25/2010, 12/26/2012, 01/02/2014   PFIZER(Purple Top)SARS-COV-2 Vaccination 05/06/2019, 05/29/2019, 11/29/2019, 07/11/2020   Pfizer Covid-19 Vaccine Bivalent Booster 54yr & up 03/21/2021   Pneumococcal Conjugate-13 06/16/2013   Pneumococcal Polysaccharide-23 03/23/2006   Tdap 08/14/2014, 09/07/2019   Zoster Recombinat (Shingrix) 11/11/2017, 03/18/2018   Zoster, Live 08/14/2014    Health Maintenance  Topic Date Due   COVID-19 Vaccine (6 - Pfizer risk series) 12/05/2021 (Originally 05/16/2021)   INFLUENZA VACCINE  06/21/2022 (Originally 10/21/2021)   TSamul Dada  09/06/2029   Pneumonia Vaccine 13+ Years old  Completed   Zoster Vaccines- Shingrix  Completed   HPV VACCINES  Aged Out       Assessment  This is a routine wellness examination for Samuel Mcconnell.  Health Maintenance: Due or Overdue There are no preventive care reminders to display for this patient.   Samuel Mcconnell does not need a referral for Community Assistance: Care  Management:   no Social Work:    no Prescription Assistance:  no Nutrition/Diabetes Education:  no   Plan:  Personalized Goals  Goals Addressed               This Visit's Progress     Patient Stated (pt-stated)        Patient stated that he would like to be able to walk without a walker or a cane.       Personalized Health Maintenance & Screening Recommendations  Influenza vaccine  Lung Cancer Screening Recommended: no (Low Dose CT Chest recommended if Age 34-80 years, 30 pack-year currently smoking OR have quit w/in past 15 years) Hepatitis C Screening recommended: no HIV Screening recommended: no  Advanced Directives: Written information was not prepared per patient's request.  Referrals & Orders No orders of the defined types were placed in this encounter.   Follow-up Plan Follow-up with Samuel Nutting, DO as planned Schedule your influenza vaccine. Medicare wellness visit in one year. Patient will access AVS on my chart.   I have personally reviewed and noted the following in the patient's chart:   Medical and social history Use of alcohol, tobacco or illicit drugs  Current medications and supplements Functional ability and status Nutritional status Physical activity Advanced directives List of other physicians Hospitalizations, surgeries, and ER visits in previous 12 months Vitals Screenings to include cognitive, depression, and falls Referrals and appointments  In addition, I have reviewed and discussed with Samuel Mcconnell certain preventive protocols, quality metrics, and best practice recommendations. A written personalized care plan for preventive services as well as general preventive health recommendations is available and can be mailed to the patient at his request.      Tinnie Gens, RN BSN  11/19/2021

## 2021-11-19 NOTE — Patient Instructions (Addendum)
[place] Maintenance Summary and Written Plan of Care  [name] ,  Thank you for allowing me to perform your Medicare Annual Wellness Visit and for your ongoing commitment to your health.   Health Maintenance & Immunization History Health Maintenance  Topic Date Due  . COVID-19 Vaccine (6 - Pfizer risk series) 12/05/2021 (Originally 05/16/2021)  . INFLUENZA VACCINE  06/21/2022 (Originally 10/21/2021)  . TETANUS/TDAP  09/06/2029  . Pneumonia Vaccine 74+ Years old  Completed  . Zoster Vaccines- Shingrix  Completed  . HPV VACCINES  Aged Out   Immunization History  Administered Date(s) Administered  . Fluad Quad(high Dose 65+) 01/22/2020, 11/18/2020  . Influenza Split 12/25/2010  . Influenza, High Dose Seasonal PF 01/14/2015, 12/31/2015, 12/24/2016, 01/21/2018, 01/21/2018, 12/14/2018  . Influenza, Seasonal, Injecte, Preservative Fre 01/02/2014  . Influenza-Unspecified 12/25/2010, 12/26/2012, 01/02/2014  . PFIZER(Purple Top)SARS-COV-2 Vaccination 05/06/2019, 05/29/2019, 11/29/2019, 07/11/2020  . Pension scheme manager 58yr & up 03/21/2021  . Pneumococcal Conjugate-13 06/16/2013  . Pneumococcal Polysaccharide-23 03/23/2006  . Tdap 08/14/2014, 09/07/2019  . Zoster Recombinat (Shingrix) 11/11/2017, 03/18/2018  . Zoster, Live 08/14/2014    These are the patient goals that we discussed:  Goals Addressed              This Visit's Progress   .  Patient Stated (pt-stated)        Patient stated that he would like to be able to walk without a walker or a cane.        This is a list of Health Maintenance Items that are overdue or due now: Influenza vaccine  Orders/Referrals Placed Today: No orders of the defined types were placed in this encounter.  (Contact our referral department at [phone] you have not spoken with someone about your referral appointment within the next 5 days)    Follow-up Plan Follow-up with  Samuel Nutting DO as planned Schedule your influenza vaccine. Medicare wellness visit in one year. Patient will access AVS on my chart.      Health Maintenance, Male Adopting a healthy lifestyle and getting preventive care are important in promoting health and wellness. Ask your health care provider about: The right schedule for you to have regular tests and exams. Things you can do on your own to prevent diseases and keep yourself healthy. What should I know about diet, weight, and exercise? Eat a healthy diet  Eat a diet that includes plenty of vegetables, fruits, low-fat dairy products, and lean protein. Do not eat a lot of foods that are high in solid fats, added sugars, or sodium. Maintain a healthy weight Body mass index (BMI) is a measurement that can be used to identify possible weight problems. It estimates body fat based on height and weight. Your health care provider can help determine your BMI and help you achieve or maintain a healthy weight. Get regular exercise Get regular exercise. This is one of the most important things you can do for your health. Most adults should: Exercise for at least 150 minutes each week. The exercise should increase your heart rate and make you sweat (moderate-intensity exercise). Do strengthening exercises at least twice a week. This is in addition to the moderate-intensity exercise. Spend less time sitting. Even light physical activity can be beneficial. Watch cholesterol and blood lipids Have your blood tested for lipids and cholesterol at 86years of age, then have this test every 5 years. You may need to have your cholesterol levels checked more often if:  Your lipid or cholesterol levels are high. You are older than 86 years of age. You are at high risk for heart disease. What should I know about cancer screening? Many types of cancers can be detected early and may often be prevented. Depending on your health history and family history,  you may need to have cancer screening at various ages. This may include screening for: Colorectal cancer. Prostate cancer. Skin cancer. Lung cancer. What should I know about heart disease, diabetes, and high blood pressure? Blood pressure and heart disease High blood pressure causes heart disease and increases the risk of stroke. This is more likely to develop in people who have high blood pressure readings or are overweight. Talk with your health care provider about your target blood pressure readings. Have your blood pressure checked: Every 3-5 years if you are 62-26 years of age. Every year if you are 45 years old or older. If you are between the ages of 77 and 84 and are a current or former smoker, ask your health care provider if you should have a one-time screening for abdominal aortic aneurysm (AAA). Diabetes Have regular diabetes screenings. This checks your fasting blood sugar level. Have the screening done: Once every three years after age 22 if you are at a normal weight and have a low risk for diabetes. More often and at a younger age if you are overweight or have a high risk for diabetes. What should I know about preventing infection? Hepatitis B If you have a higher risk for hepatitis B, you should be screened for this virus. Talk with your health care provider to find out if you are at risk for hepatitis B infection. Hepatitis C Blood testing is recommended for: Everyone born from [birth year] through 1965. Anyone with known risk factors for hepatitis C. Sexually transmitted infections (STIs) You should be screened each year for STIs, including gonorrhea and chlamydia, if: You are sexually active and are younger than 86 years of age. You are older than 86 years of age and your health care provider tells you that you are at risk for this type of infection. Your sexual activity has changed since you were last screened, and you are at increased risk for chlamydia or gonorrhea. Ask  your health care provider if you are at risk. Ask your health care provider about whether you are at high risk for HIV. Your health care provider may recommend a prescription medicine to help prevent HIV infection. If you choose to take medicine to prevent HIV, you should first get tested for HIV. You should then be tested every 3 months for as long as you are taking the medicine. Follow these instructions at home: Alcohol use Do not drink alcohol if your health care provider tells you not to drink. If you drink alcohol: Limit how much you have to 0-2 drinks a day. Know how much alcohol is in your drink. In the [place] one drink equals one 12 oz bottle of beer (355 mL), one 5 oz glass of wine (148 mL), or one 1 oz glass of hard liquor (44 mL). Lifestyle Do not use any products that contain nicotine or tobacco. These products include cigarettes, chewing tobacco, and vaping devices, such as e-cigarettes. If you need help quitting, ask your health care provider. Do not use street drugs. Do not share needles. Ask your health care provider for help if you need support or information about quitting drugs. General instructions Schedule regular health, dental, and eye exams. Stay  current with your vaccines. Tell your health care provider if: You often feel depressed. You have ever been abused or do not feel safe at home. Summary Adopting a healthy lifestyle and getting preventive care are important in promoting health and wellness. Follow your health care provider's instructions about healthy diet, exercising, and getting tested or screened for diseases. Follow your health care provider's instructions on monitoring your cholesterol and blood pressure. This information is not intended to replace advice given to you by your health care provider. Make sure you discuss any questions you have with your health care provider. Document Revised: 07/29/2020 Document Reviewed: 07/29/2020 Elsevier Patient  Education  [place].

## 2021-11-25 ENCOUNTER — Ambulatory Visit: Payer: Medicare HMO | Admitting: Family Medicine

## 2021-12-02 ENCOUNTER — Encounter: Payer: Self-pay | Admitting: Family Medicine

## 2021-12-02 ENCOUNTER — Ambulatory Visit (INDEPENDENT_AMBULATORY_CARE_PROVIDER_SITE_OTHER): Payer: Medicare HMO | Admitting: Family Medicine

## 2021-12-02 ENCOUNTER — Ambulatory Visit (INDEPENDENT_AMBULATORY_CARE_PROVIDER_SITE_OTHER): Payer: Medicare HMO

## 2021-12-02 VITALS — BP 131/68 | HR 66 | Ht 71.0 in | Wt 181.0 lb

## 2021-12-02 DIAGNOSIS — J690 Pneumonitis due to inhalation of food and vomit: Secondary | ICD-10-CM | POA: Diagnosis not present

## 2021-12-02 DIAGNOSIS — J189 Pneumonia, unspecified organism: Secondary | ICD-10-CM | POA: Diagnosis not present

## 2021-12-02 DIAGNOSIS — Z23 Encounter for immunization: Secondary | ICD-10-CM | POA: Diagnosis not present

## 2021-12-02 NOTE — Assessment & Plan Note (Signed)
Has mild crackles in the left base on exam today.  Aspiration precautions reinforced.  Updated chest x-ray ordered.

## 2021-12-02 NOTE — Progress Notes (Signed)
Samuel Mcconnell - 88 y.o. male MRN [id number]  Date of birth: 1927/10/04  Subjective Chief Complaint  Patient presents with   Hypertension    HPI Samuel Mcconnell is is a 88 year old male here today for follow-up.  Reports that overall he is feeling pretty well.  He does continue to have intermittent cough.  Previously with aspiration pneumonia completed treatment with antibiotics.  Advised changes for aspiration prevention including medications and pure.  He has been doing this.  He has not been following bites of food with extra sips and concentrating on chewing thoroughly.  He denies shortness of breath.  He has not had fever or chills.  ROS:  A comprehensive ROS was completed and negative except as noted per HPI  Allergies  Allergen Reactions   Clindamycin/Lincomycin Other (See Comments)    Patient "felt weird"    Dabigatran Etexilate Mesylate Other (See Comments)    "felt weird" (name brand Pradaxa)   Doxycycline Other (See Comments)    Dyspepsia    Past Medical History:  Diagnosis Date   Atrial fibrillation ([place])    radiofrequency ablation   Emphysema    Hypertension    Thyroid disease     Past Surgical History:  Procedure Laterality Date   CARDIAC SURGERY     CARDIOVERSION N/A 10/24/2019   Procedure: CARDIOVERSION;  Surgeon: Sanda Klein, MD;  Location: [place];  Service: Cardiovascular;  Laterality: N/A;   CARDIOVERSION N/A 10/22/2020   Procedure: CARDIOVERSION;  Surgeon: Thayer Headings, MD;  Location: [place];  Service: Cardiovascular;  Laterality: N/A;   [place] ARTHROSCOPY Left 07/2000   LOOP RECORDER IMPLANT  01/16/2010   [employer] Reveal XT (Dr. Norlene Duel)    RADIOFREQUENCY ABLATION  06/13/2009   SHOULDER ARTHROSCOPY WITH ROTATOR CUFF REPAIR Left 04/1996   TIBIAL TUBERCLERPLASTY  ~ 10/2010   TRANSTHORACIC ECHOCARDIOGRAM  11/19/2011   EF=>55%; mild MR, mild mitral annular calcif; mild TR, normal RSVP; AV mildly sclerotic, mild AV regurg; trace  pulm valve regurg     Social History   Socioeconomic History   Marital status: Married    Spouse name: Samuel Mcconnell   Number of children: 2   Years of education: 16   Highest education level: Bachelor's degree (e.g., BA, AB, BS)  Occupational History   Occupation: Retired  Tobacco Use   Smoking status: Former    Years: 46.00    Types: Cigarettes    Quit date: 03/14/2002    Years since quitting: 19.7   Smokeless tobacco: Never   Tobacco comments:    Former smoker 01/23/2021  Vaping Use   Vaping Use: Never used  Substance and Sexual Activity   Alcohol use: No   Drug use: No   Sexual activity: Not Currently    Partners: Female  Other Topics Concern   Not on file  Social History Narrative   Lives at [employer] [facility] with his wife. He is still driving. He enjoys golf and gardening (when is he is able to).   Social Determinants of Health   Financial Resource Strain: Low Risk  (11/19/2021)   Overall Financial Resource Strain (CARDIA)    Difficulty of Paying Living Expenses: Not hard at all  Food Insecurity: No Food Insecurity (11/19/2021)   Hunger Vital Sign    Worried About Running Out of Food in the Last Year: Never true    Ran Out of Food in the Last Year: Never true  Transportation Needs: No Transportation Needs (11/19/2021)  PRAPARE - Hydrologist (Medical): No    Lack of Transportation (Non-Medical): No  Physical Activity: Sufficiently Active (11/19/2021)   Exercise Vital Sign    Days of Exercise per Week: 3 days    Minutes of Exercise per Session: 50 min  Stress: No Stress Concern Present (11/19/2021)   [place]    Feeling of Stress : Not at all  Social Connections: Moderately Isolated (11/19/2021)   Social Connection and Isolation Panel [NHANES]    Frequency of Communication with Friends and Family: More than three times a week    Frequency of Social Gatherings with Friends  and Family: More than three times a week    Attends Religious Services: Never    Marine scientist or Organizations: No    Attends Music therapist: Never    Marital Status: Married    Family History  Problem Relation Age of Onset   Heart failure Father        MI - died @ 26   Heart failure Mother    Diabetes Son    Pancreatitis Son        also ETOH abuse    Cancer Maternal Grandmother    Heart disease Maternal Grandfather    Atrial fibrillation Brother        dx'ed age 55   Atrial fibrillation Brother        dx'ed age 2   Heart failure Brother    Atrial fibrillation Sister    Hypertension Sister    Heart Problems Sister    Diabetes Sister    Heart failure Sister     Health Maintenance  Topic Date Due   COVID-19 Vaccine (6 - Pfizer risk series) 12/05/2021 (Originally 05/16/2021)   TETANUS/TDAP  09/06/2029   Pneumonia Vaccine 64+ Years old  Completed   INFLUENZA VACCINE  Completed   Zoster Vaccines- Shingrix  Completed   HPV VACCINES  Aged Out     ----------------------------------------------------------------------------------------------------------------------------------------------------------------------------------------------------------------- Physical Exam BP 131/68 (BP Location: Left Arm, Patient Position: Sitting, Cuff Size: Normal)   Pulse 66   Ht '5\' 11"'$  (1.803 m)   Wt 181 lb (82.1 kg)   SpO2 96%   BMI 25.24 kg/m   Physical Exam Constitutional:      Appearance: Normal appearance.  Eyes:     General: No scleral icterus. Cardiovascular:     Rate and Rhythm: Normal rate and regular rhythm.  Pulmonary:     Effort: Pulmonary effort is normal.     Breath sounds: Normal breath sounds.     Comments: Mild crackles in left base. Musculoskeletal:     Cervical back: Neck supple.  Neurological:     Mental Status: He is alert.  Psychiatric:        Mood and Affect: Mood normal.        Behavior: Behavior normal.      ------------------------------------------------------------------------------------------------------------------------------------------------------------------------------------------------------------------- Assessment and Plan  Aspiration pneumonia (HCC) Has mild crackles in the left base on exam today.  Aspiration precautions reinforced.  Updated chest x-ray ordered.   No orders of the defined types were placed in this encounter.   No follow-ups on file.    This visit occurred during the SARS-CoV-2 public health emergency.  Safety protocols were in place, including screening questions prior to the visit, additional usage of staff PPE, and extensive cleaning of exam room while observing appropriate contact time as indicated for disinfecting solutions.

## 2021-12-05 ENCOUNTER — Ambulatory Visit: Payer: Medicare HMO | Admitting: Cardiovascular Disease

## 2021-12-12 DIAGNOSIS — B351 Tinea unguium: Secondary | ICD-10-CM | POA: Diagnosis not present

## 2021-12-12 DIAGNOSIS — L97921 Non-pressure chronic ulcer of unspecified part of left lower leg limited to breakdown of skin: Secondary | ICD-10-CM | POA: Diagnosis not present

## 2021-12-30 ENCOUNTER — Ambulatory Visit: Payer: Medicare HMO | Admitting: Family Medicine

## 2022-01-13 ENCOUNTER — Encounter: Payer: Self-pay | Admitting: Family Medicine

## 2022-01-31 ENCOUNTER — Other Ambulatory Visit: Payer: Self-pay | Admitting: Cardiovascular Disease

## 2022-01-31 ENCOUNTER — Other Ambulatory Visit: Payer: Self-pay | Admitting: Family Medicine

## 2022-01-31 DIAGNOSIS — I517 Cardiomegaly: Secondary | ICD-10-CM

## 2022-01-31 DIAGNOSIS — I480 Paroxysmal atrial fibrillation: Secondary | ICD-10-CM

## 2022-02-04 ENCOUNTER — Other Ambulatory Visit: Payer: Self-pay | Admitting: Family Medicine

## 2022-02-04 DIAGNOSIS — J309 Allergic rhinitis, unspecified: Secondary | ICD-10-CM

## 2022-02-17 IMAGING — DX DG KNEE COMPLETE 4+V*L*
4 series · 4 of 4 positions shown · non-contrast
Comparison: 10/23/2017

CLINICAL DATA: Recent fall with knee pain, initial encounter

EXAM:
LEFT KNEE - COMPLETE 4+ VIEW

[knee ap]
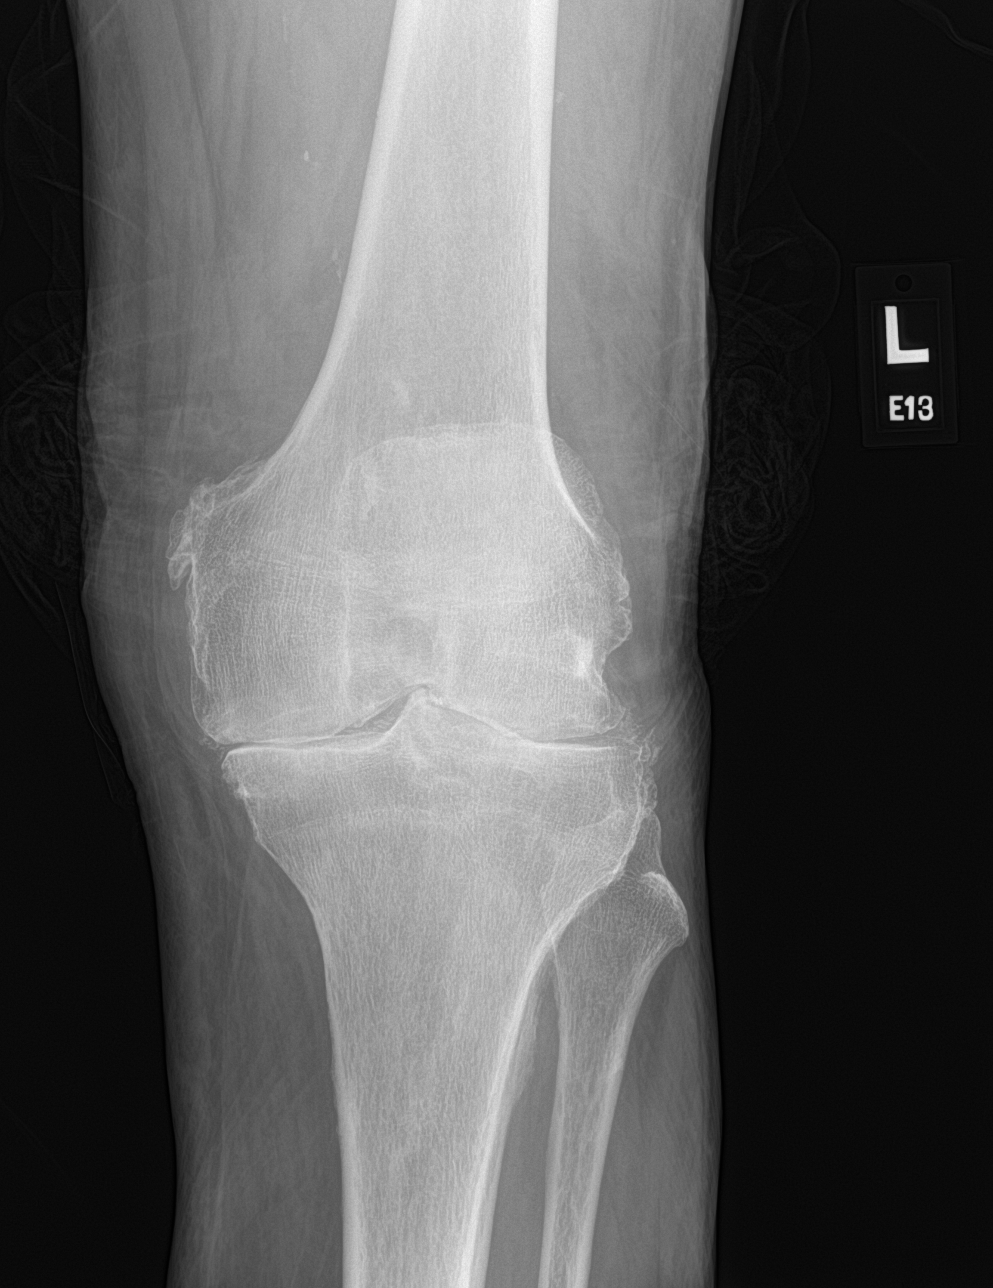

[knee lat]
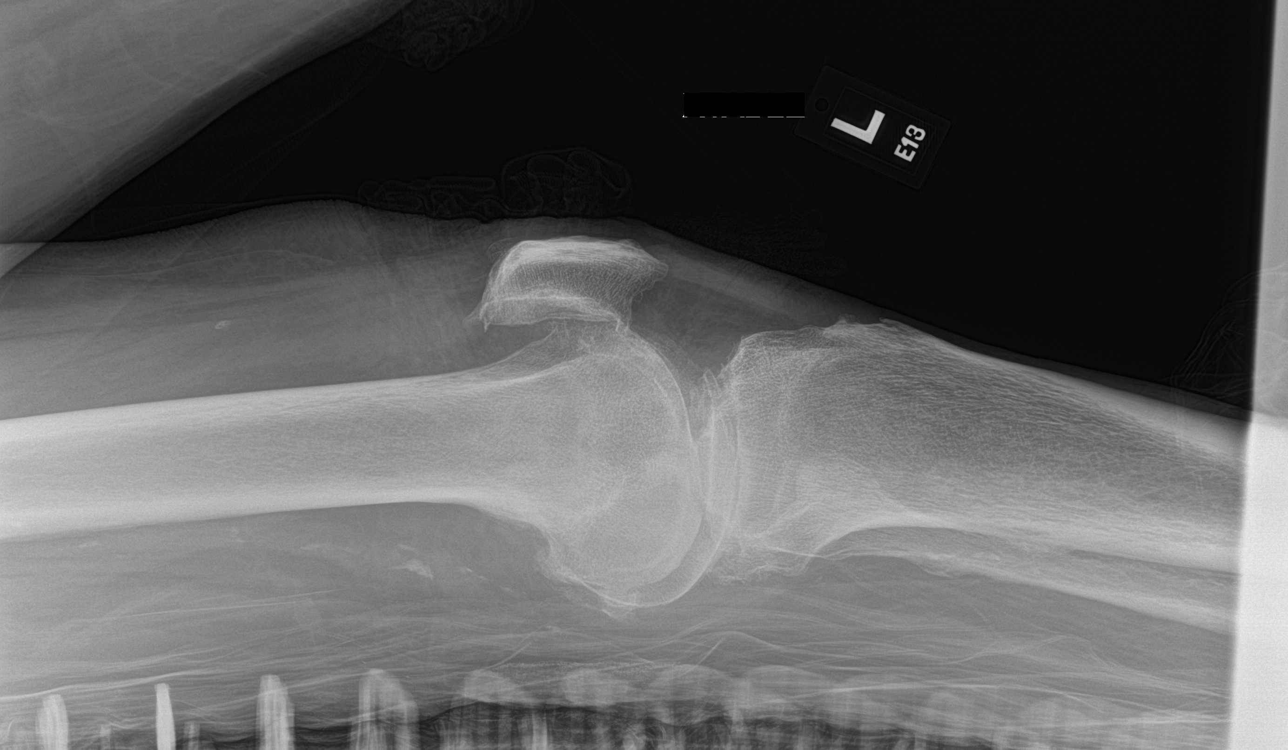

[knee obl (1 of 2)]
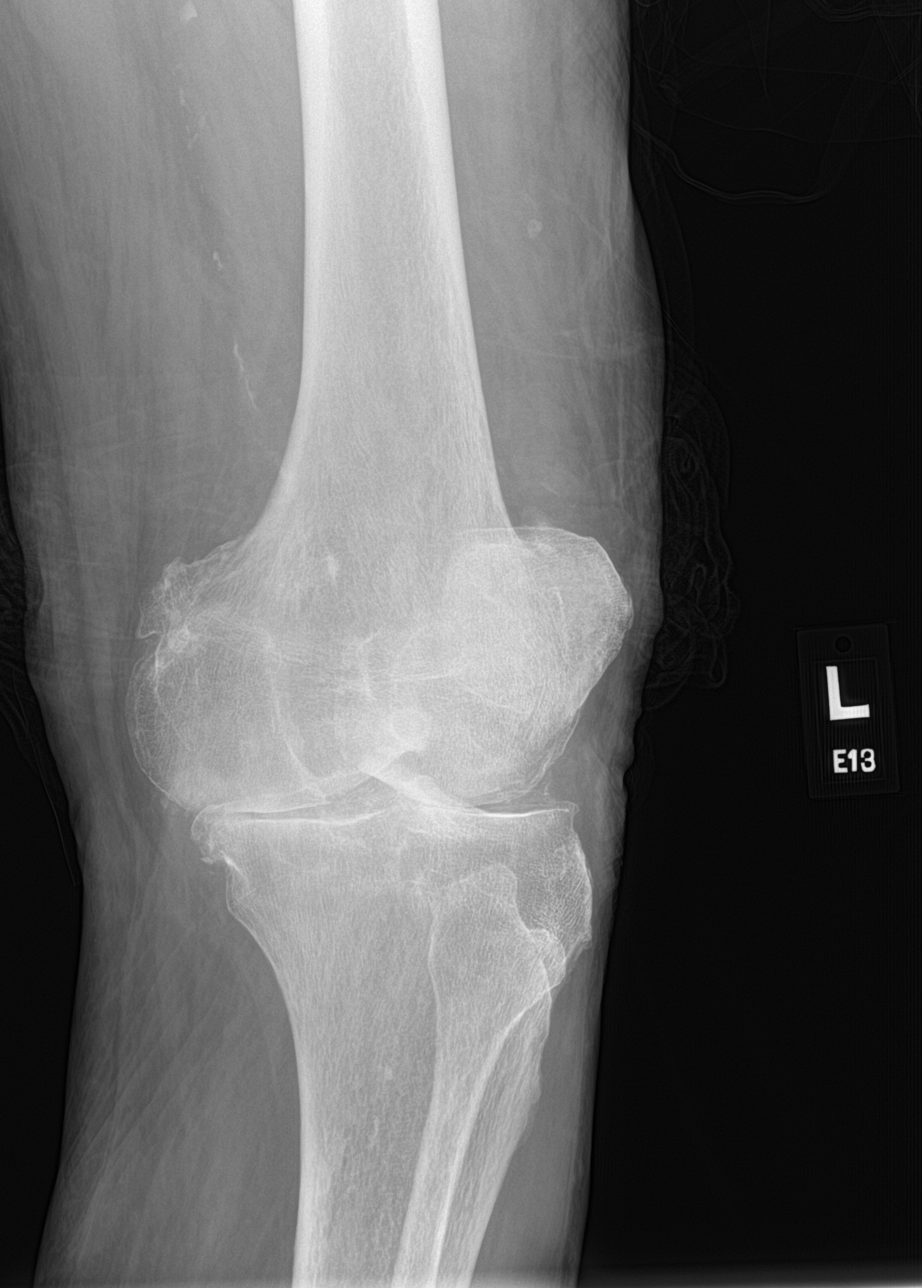

[knee obl (2 of 2)]
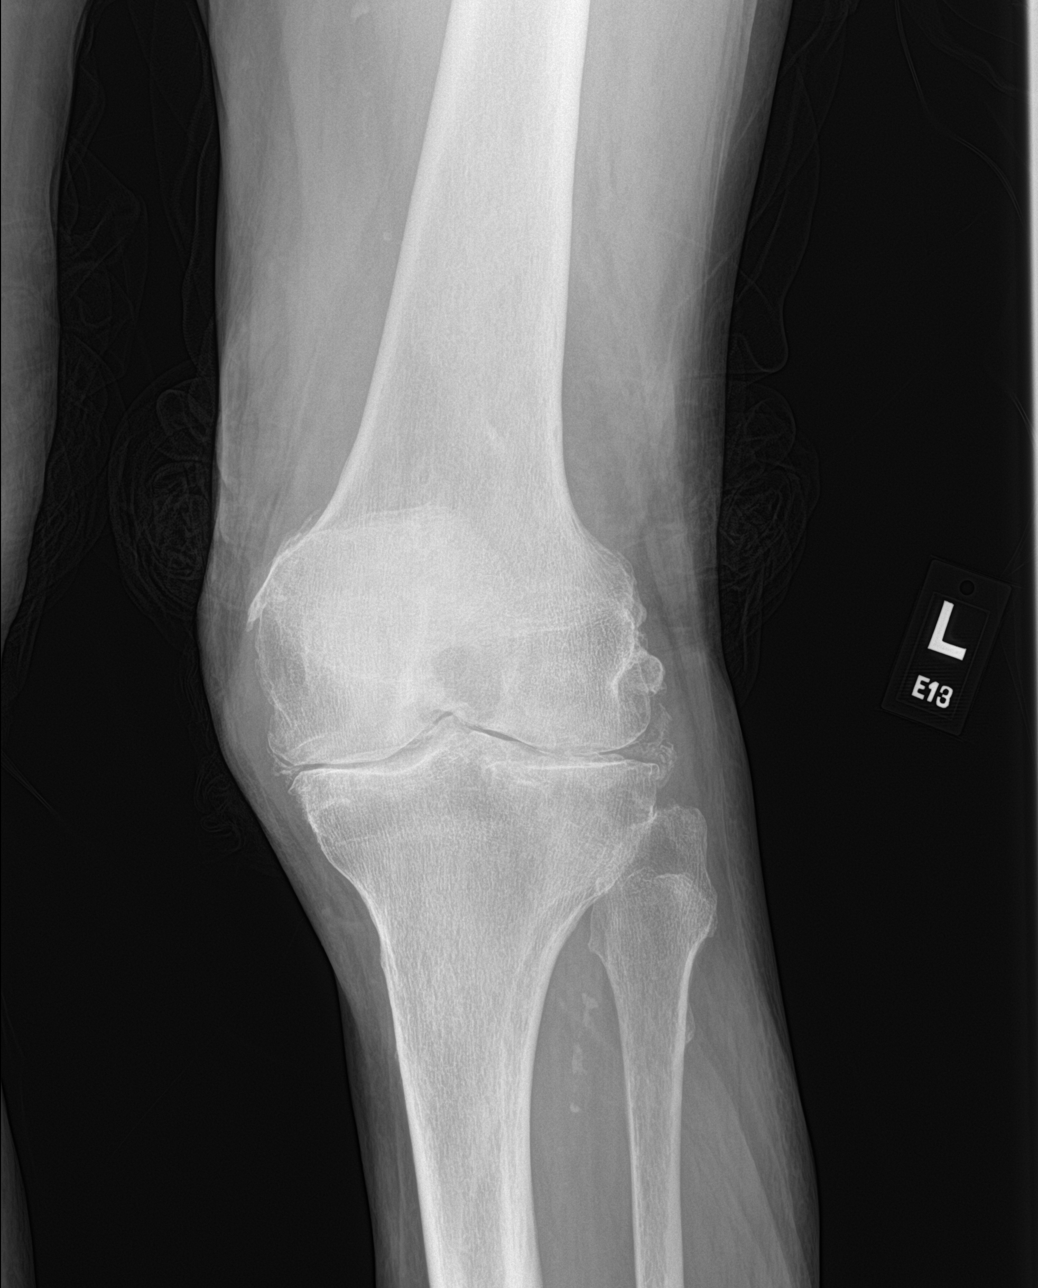

[4 of 4 positions shown; findings below may reference images not displayed]

FINDINGS: Tricompartmental degenerative changes are noted. No joint effusion
is seen. No acute fracture or dislocation is noted. No soft tissue
changes are seen.
IMPRESSION: Degenerative change without acute abnormality

## 2022-02-17 IMAGING — CT CT CERVICAL SPINE W/O CM
3 of 4 series · 12 of 35 positions shown, 14 images · non-contrast
Comparison: 02/29/2020

CLINICAL DATA: Fall, head injury, chronic anticoagulation. Head
trauma, coagulopathy (Age 19-64y); Head trauma, minor (Age >= 65y)

EXAM:
CT HEAD WITHOUT CONTRAST
CT CERVICAL SPINE WITHOUT CONTRAST
TECHNIQUE: Multidetector CT imaging of the head and cervical spine was
performed following the standard protocol without intravenous
contrast. Multiplanar CT image reconstructions of the cervical spine
were also generated.

[Series 4: orthogonal axials · axial · 0.21mm/px · z∈[-316,-193]mm · 4 of 94 slices shown, 5 images]
[im 14/94  soft-tissue]
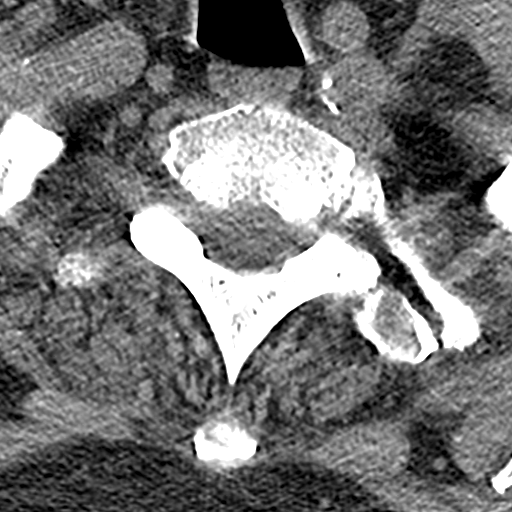
[im 14/94  bone]
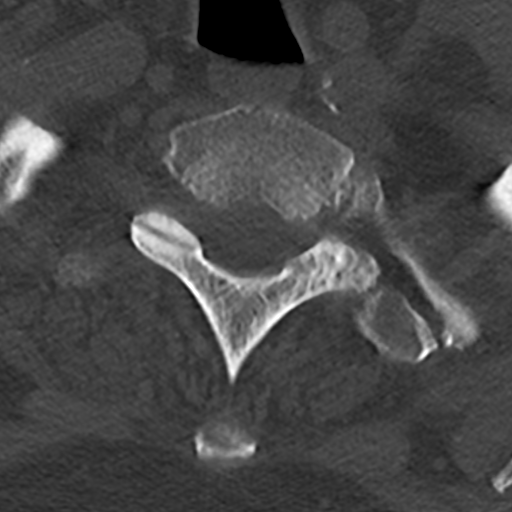
[im 40/94  bone]
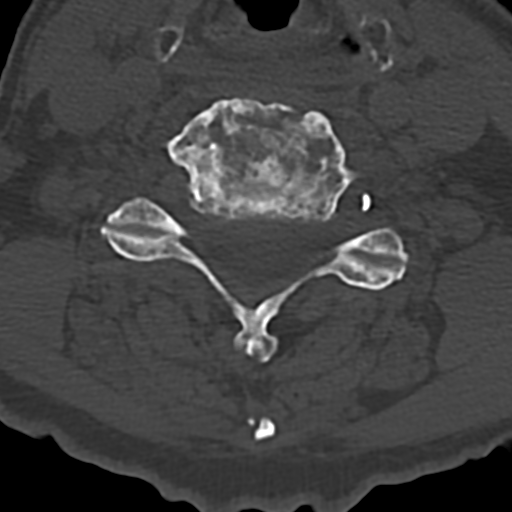
[im 54/94  bone]
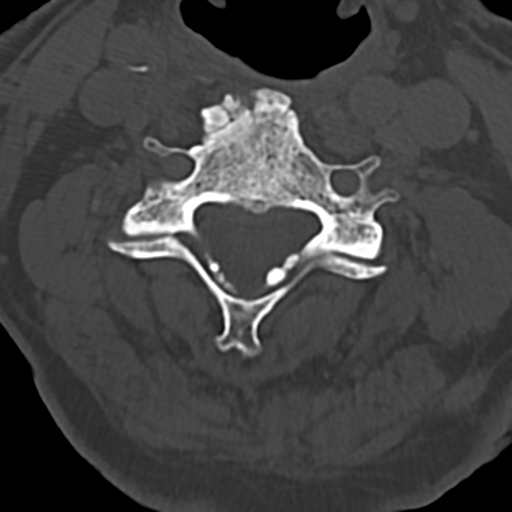
[im 80/94  bone]
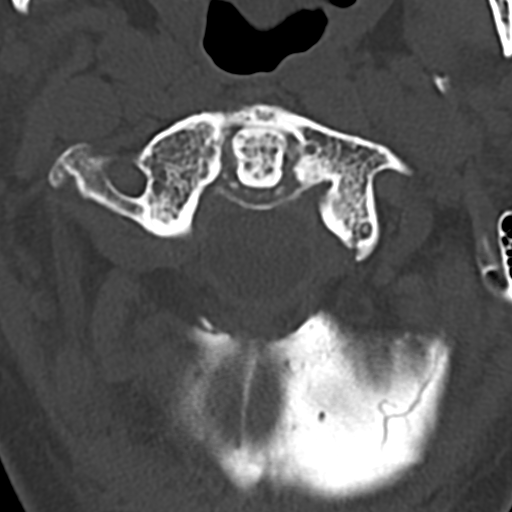

[Series 9: sag bone · sagittal · 0.24mm/px · 5 of 86 slices shown, 6 images]
[im 29/86  bone]
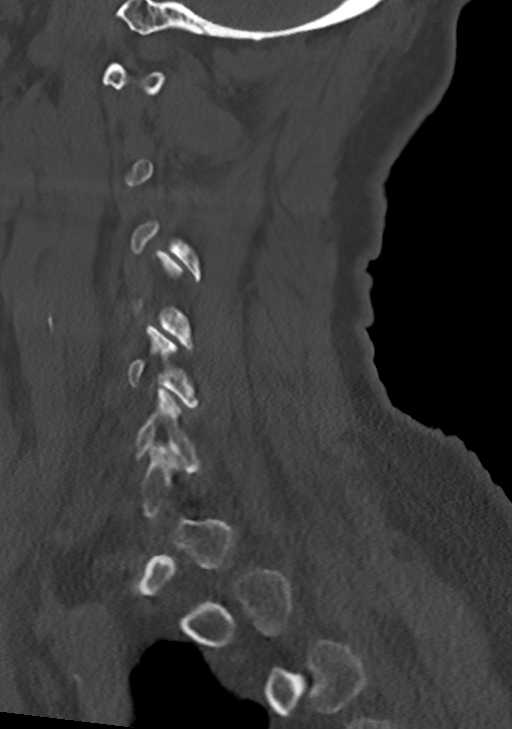
[im 36/86  bone]
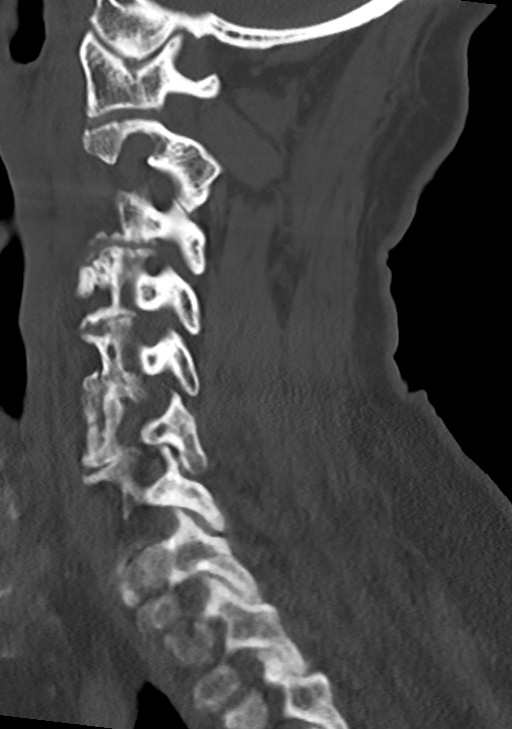
[im 43/86  soft-tissue]
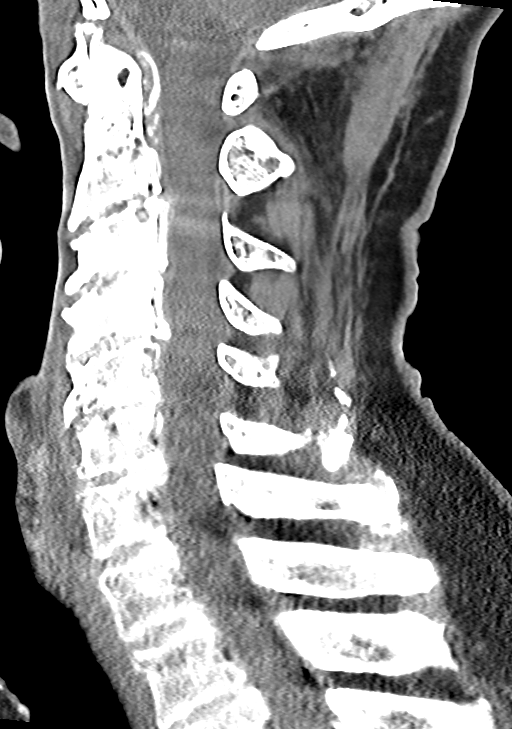
[im 43/86  bone]
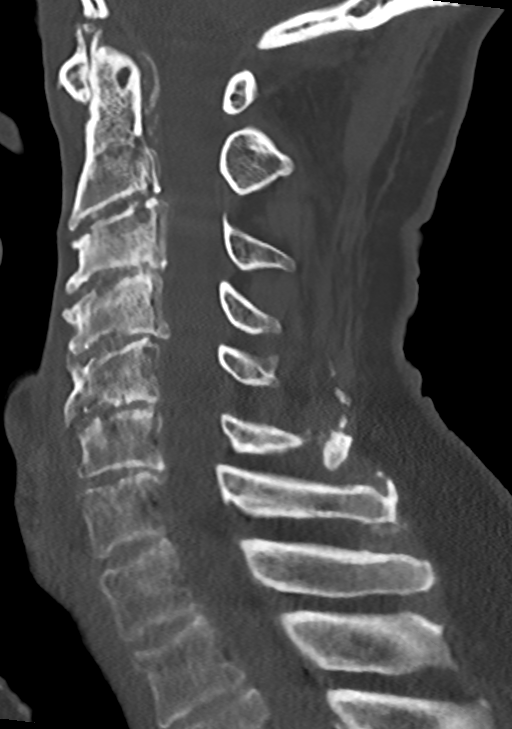
[im 50/86  bone]
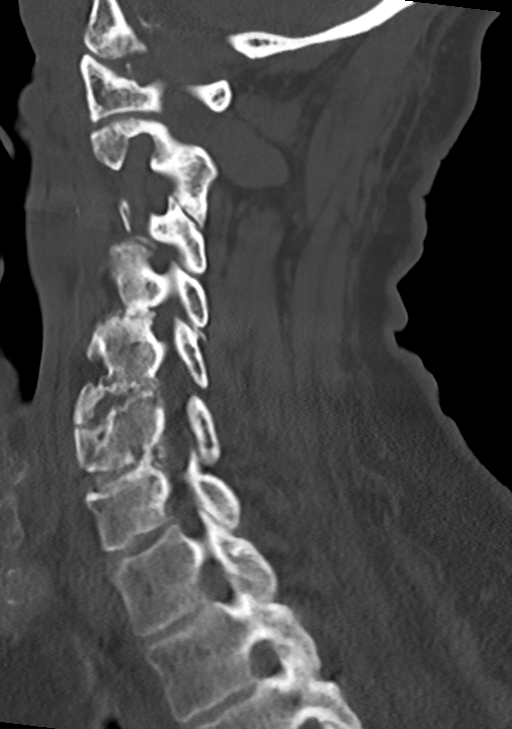
[im 57/86  bone]
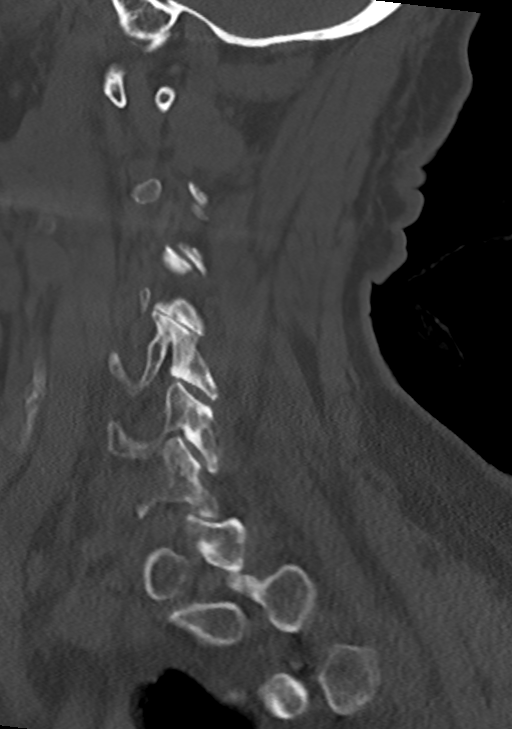

[Series 10: cor bone · coronal · 0.36mm/px · 3 of 71 slices shown]
[im 15/71  bone]
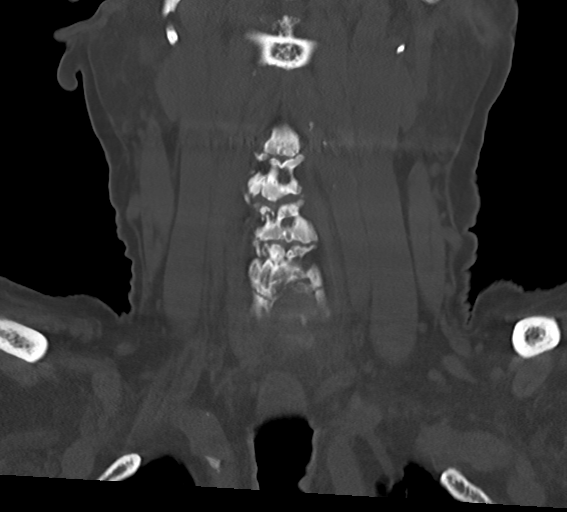
[im 29/71  bone]
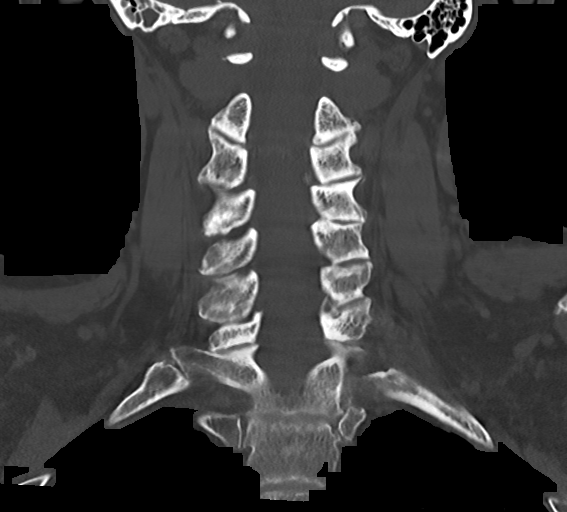
[im 43/71  bone]
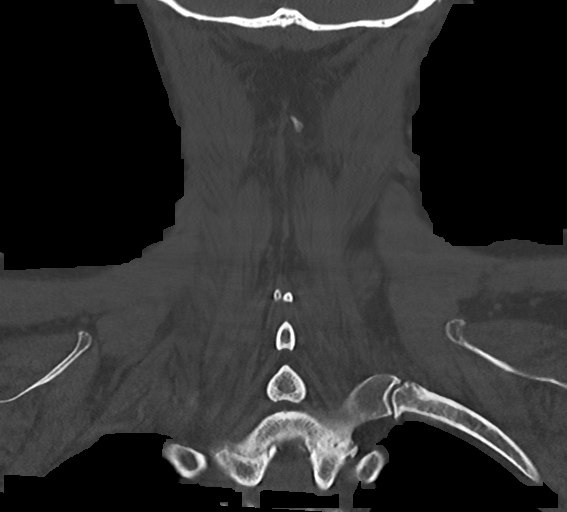

[12 of 35 positions shown; findings below may reference images not displayed]

FINDINGS: CT HEAD FINDINGS

Brain: Normal anatomic configuration. Parenchymal volume loss is
commensurate with the patient's age. Mild periventricular white
matter changes are present likely reflecting the sequela of small
vessel ischemia. No abnormal intra or extra-axial mass lesion or
fluid collection. No abnormal mass effect or midline shift. No
evidence of acute intracranial hemorrhage or infarct. Ventricular
size is normal. Cerebellum unremarkable.

Vascular: No asymmetric hyperdense vasculature at the skull base.

Skull: Intact

Sinuses/Orbits: Paranasal sinuses are clear. Ocular lenses have been
removed. Orbits are otherwise unremarkable.

Other: Mastoid air cells and middle ear cavities are clear. There is
soft tissue swelling and subcutaneous gas along the nasal bridge as
well as a small frontal and left parietal scalp hematoma.

CT CERVICAL SPINE FINDINGS

Alignment: Normal alignment.  No listhesis.

Skull base and vertebrae: Craniocervical alignment is normal. The
atlantodental interval is not widened. No acute fracture of the
cervical spine. Vertebral body height has been preserved.

Soft tissues and spinal canal: No prevertebral fluid or swelling. No
visible canal hematoma.

Disc levels: There is intervertebral disc space narrowing and
endplate remodeling C2-C7 in keeping with changes of severe
degenerative disc disease. The prevertebral soft tissues are not
thickened on sagittal reformats. Review of the axial images
demonstrates multilevel advanced uncovertebral arthrosis without
significant associated neuroforaminal narrowing or canal stenosis.

Upper chest: Negative.

Other: None
IMPRESSION: No acute intracranial abnormality. No calvarial fracture. Soft
tissue swelling superficial to the nasal bridge and multiple small
scalp hematoma as noted above.

No acute fracture or listhesis of the cervical spine.

## 2022-02-17 IMAGING — DX DG KNEE COMPLETE 4+V*R*
4 series · 4 of 4 positions shown · non-contrast
Comparison: 03/01/2019

CLINICAL DATA: Recent fall with right knee pain, initial encounter

EXAM:
RIGHT KNEE - COMPLETE 4+ VIEW

[knee ap]
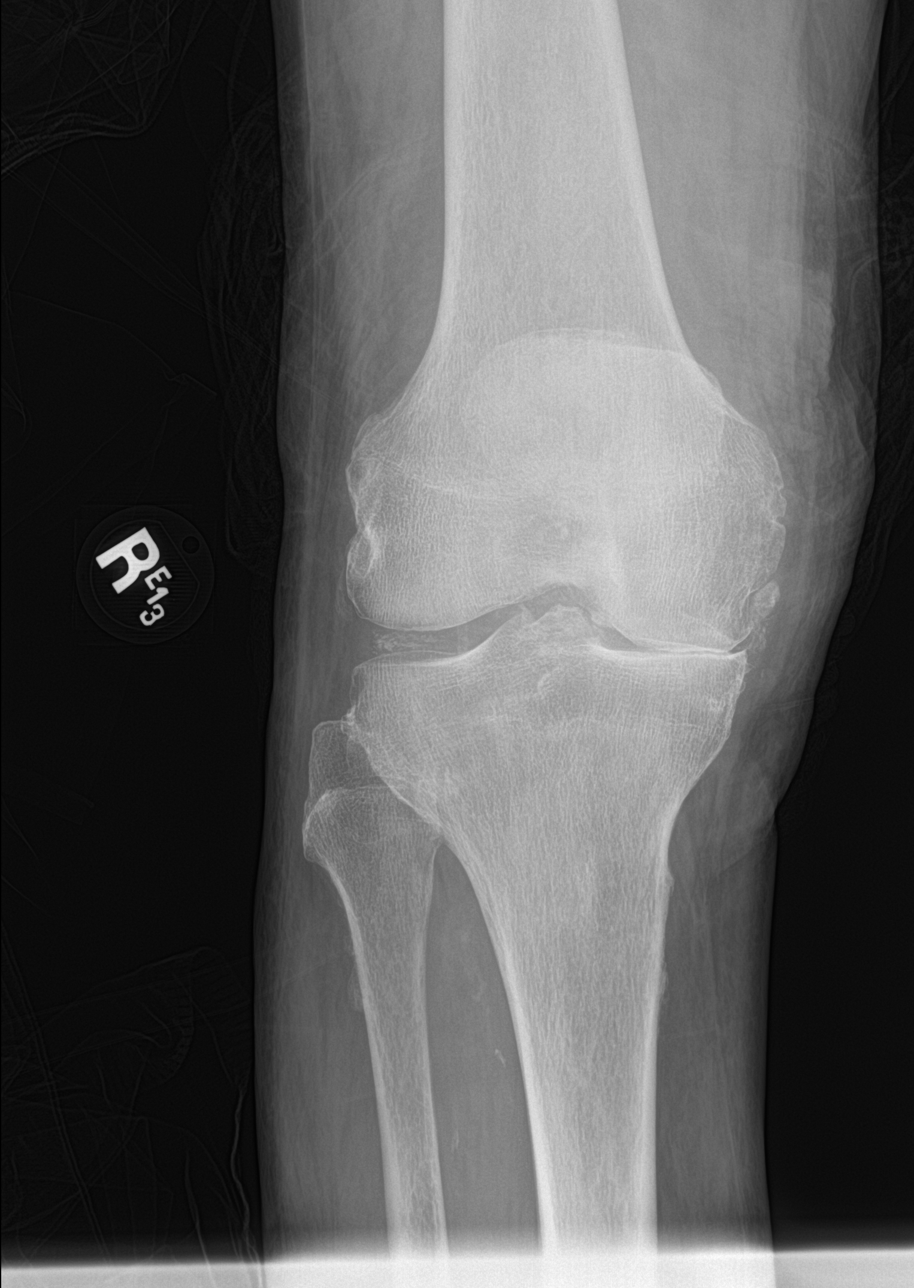

[knee lat]
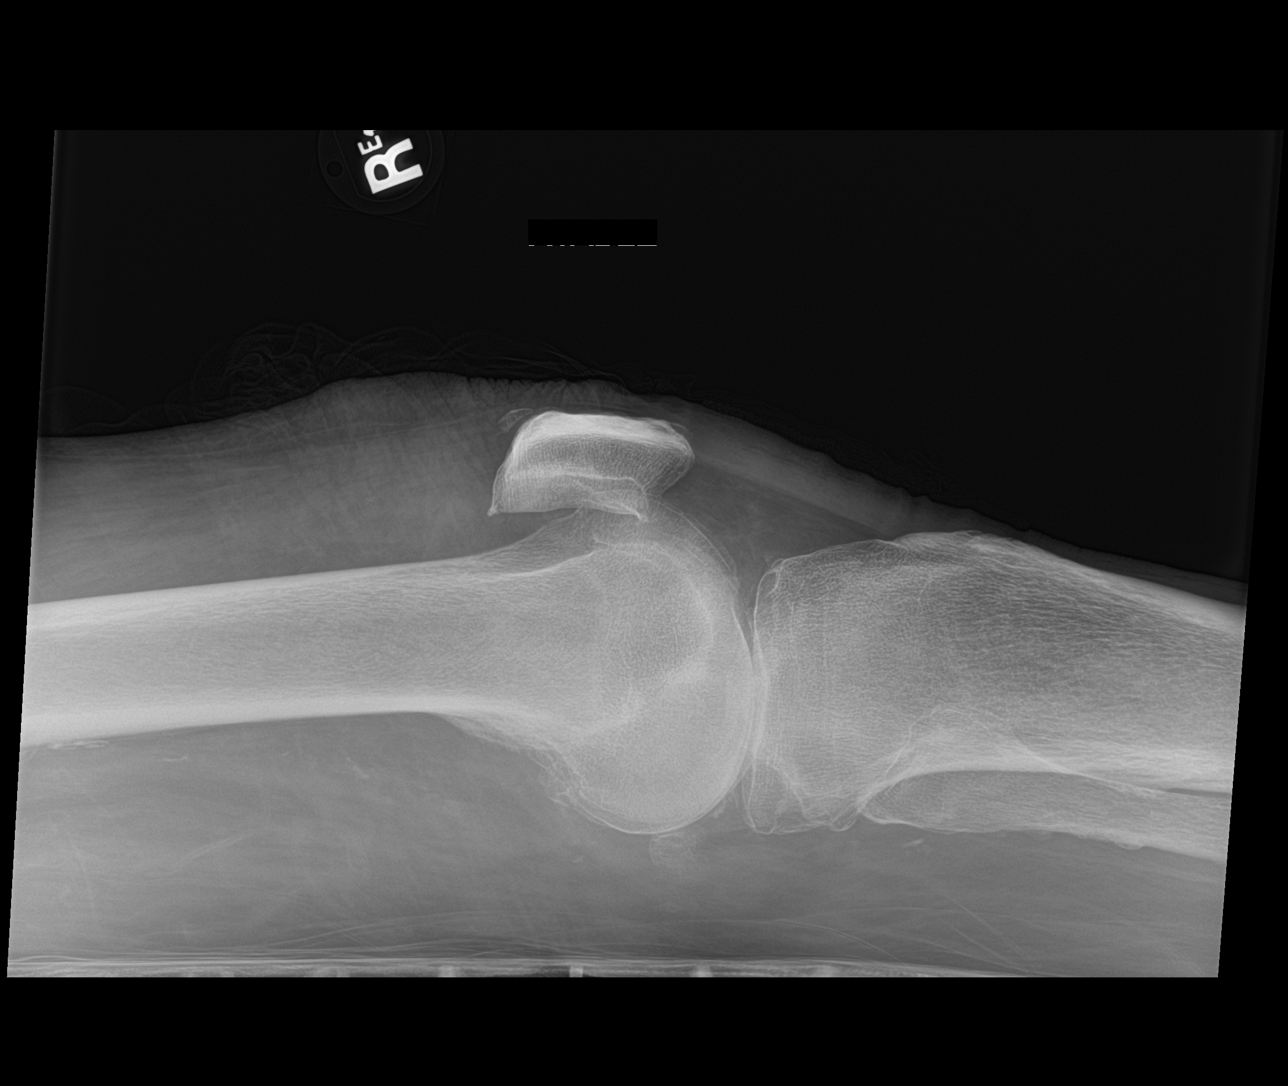

[knee obl (1 of 2)]
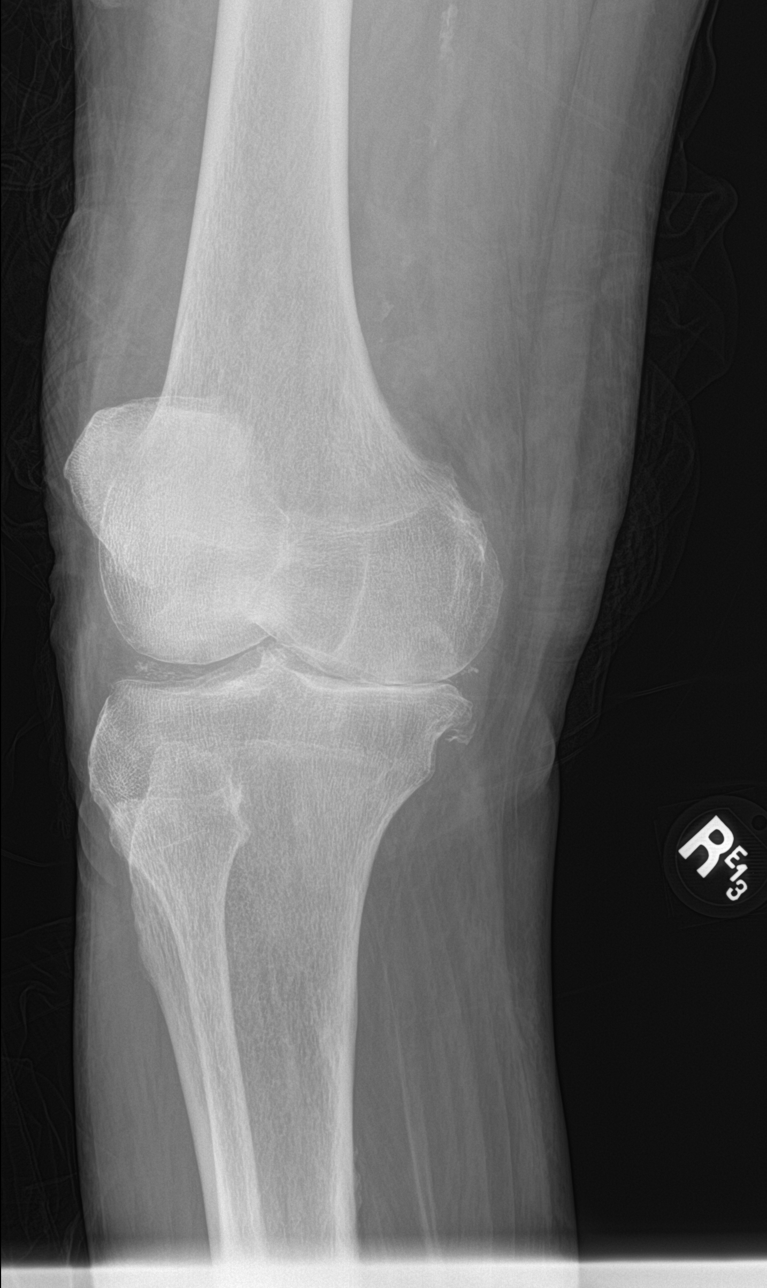

[knee obl (2 of 2)]
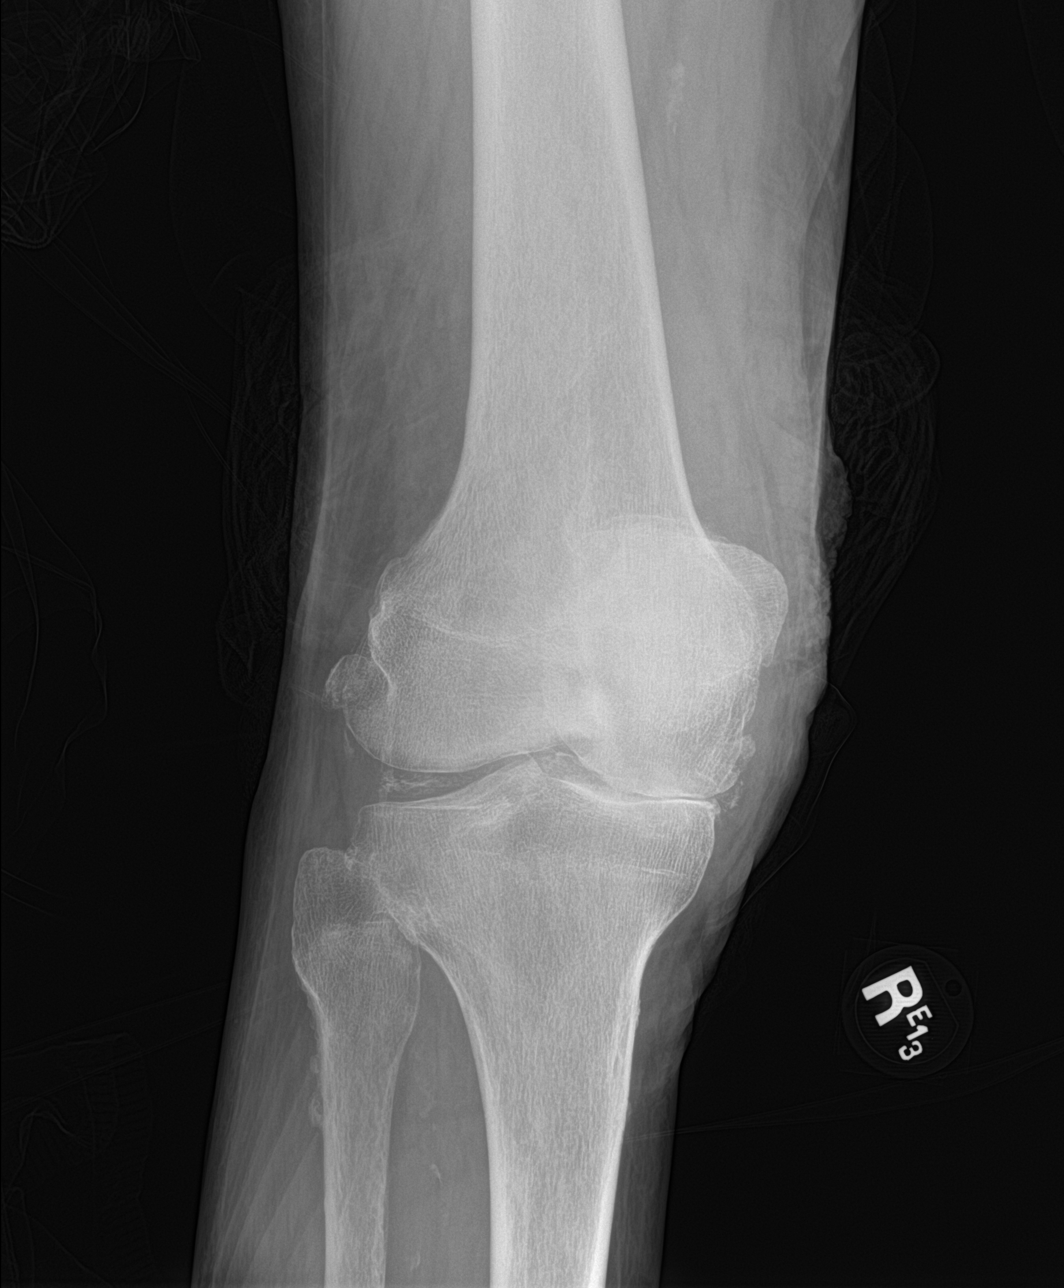

[4 of 4 positions shown; findings below may reference images not displayed]

FINDINGS: Degenerative changes are noted most prominent in the medial joint
space. No joint effusion is seen. No acute fracture or dislocation
is noted.
IMPRESSION: Degenerative change without acute abnormality.

## 2022-02-19 ENCOUNTER — Ambulatory Visit: Payer: Medicare HMO | Attending: Cardiovascular Disease | Admitting: Cardiovascular Disease

## 2022-02-19 ENCOUNTER — Encounter: Payer: Self-pay | Admitting: Cardiovascular Disease

## 2022-02-19 VITALS — BP 122/58 | HR 55 | Ht 71.0 in | Wt 179.6 lb

## 2022-02-19 DIAGNOSIS — I10 Essential (primary) hypertension: Secondary | ICD-10-CM

## 2022-02-19 DIAGNOSIS — I480 Paroxysmal atrial fibrillation: Secondary | ICD-10-CM

## 2022-02-19 DIAGNOSIS — J449 Chronic obstructive pulmonary disease, unspecified: Secondary | ICD-10-CM

## 2022-02-19 DIAGNOSIS — I495 Sick sinus syndrome: Secondary | ICD-10-CM | POA: Diagnosis not present

## 2022-02-19 DIAGNOSIS — N1831 Chronic kidney disease, stage 3a: Secondary | ICD-10-CM | POA: Diagnosis not present

## 2022-02-19 DIAGNOSIS — I5032 Chronic diastolic (congestive) heart failure: Secondary | ICD-10-CM | POA: Diagnosis not present

## 2022-02-19 MED ORDER — AMIODARONE HCL 200 MG PO TABS: ORAL_TABLET | ORAL | 3 refills | Status: DC

## 2022-02-19 NOTE — Patient Instructions (Addendum)
Medication Instructions:  DECREASE Amiodarone 200 mg to 5 days a week. You may choose which 5 days to take 1 tablet  *If you need a refill on your cardiac medications before your next appointment, please call your pharmacy*  Lab Work: Your physician recommends that you return for lab work in February 2024:  CMP TSH   If you have labs (blood work) drawn today and your tests are completely normal, you will receive your results only by: [employer] (if you have [facility]) OR A paper copy in the mail If you have any lab test that is abnormal or we need to change your treatment, we will call you to review the results.  Testing/Procedures: NONE ordered at this time of appointment   Follow-Up: At [facility], you and your health needs are our priority.  As part of our continuing mission to provide you with exceptional heart care, we have created designated Provider Care Teams.  These Care Teams include your primary Cardiologist (physician) and Advanced Practice Providers (APPs -  Physician Assistants and Nurse Practitioners) who all work together to provide you with the care you need, when you need it.  Your next appointment:   1 year(s)  The format for your next appointment:   In Person  Provider:   Sanda Klein, MD     Other Instructions  Important Information About Sugar

## 2022-02-19 NOTE — Progress Notes (Signed)
Cardiology Office Note    Date:  02/19/2022   ID:  Samuel Mcconnell, DOB 06/15/1927, MRN [id number]  PCP:  Luetta Nutting, DO  Cardiologist:   Sanda Klein, MD   Chief Complaint  Patient presents with   Atrial Fibrillation     History of Present Illness:  Samuel Mcconnell is a 88 y.o. male with history of radiofrequency ablation for atrial fibrillation roughly 10 years ago, recently recurrent as atrial fibrillation for which he underwent elective cardioversion successfully in August 2022.  He has a tendency to have sinus bradycardia as well as first-degree AV block and right bundle branch block.  His wife Samuel Mcconnell is also my patient.  They are both here today for office appointments accompanied by their daughter.  He has had very few if any palpitations.  He keeps a very detailed update of his heart rate and blood pressure.  His heart rate is a little slow today at 55 bpm, but more often at home his heart rate is in the low 60s.  He was having some issues of orthostatic dizziness so he stopped his losartan.  His blood pressure is consistently well-controlled typically in the 120s/60s, without any antihypertensive medications now.    His weight has been very steady in the 177-180 pound range.  He takes furosemide daily for ankle edema, but often skips a day to go out for activities.  He does not develop severe edema or any respiratory symptoms when he skips it.  He has not had any fall falls or serious bleeding problems.  He denies syncope.  He does not have chest pain at rest or with activity.  He continues to exercise at the gym on a stationary bike 3 days a week.  He does not develop any claudication on the bike.  He has restless legs at night sometimes.  Ropinirole did not really help.  Since he has had recurrent problems with pneumonia (most recently admitted in August 2023) there is some suspicion he may be having aspiration.  He has had an evaluation by speech pathologist and has  undergone some therapy for this.  On March 06, 2022 Samuel Mcconnell and his wife Samuel Mcconnell will be celebrating their 90nd wedding anniversary.  On September 07 2019, he fell down the basement stairs while carrying heavy equipment.  On arrival to the hospital [facility]) he was found to be in atrial fibrillation.  Looking back his family wonders whether the arrhythmia may have started a month or 2 before, when he started complaining of some fatigue.  He was hospitalized and rate control was achieved on a combination of metoprolol and diltiazem.  Eliquis was started.  He had an echocardiogram that showed normal left ventricular systolic function with mild to moderate tricuspid regurgitation and moderate pulmonary hypertension.    His last echocardiogram in in the [facility] system was performed in 2018 and showed normal left ventricular systolic function and no clear evidence of diastolic dysfunction (his tissue Doppler velocities were actually probably normal for almost 88 years old).  The left atrium was "moderately dilated" (systolic diameter was only 36 mm, but indexed volume was 28.5 which I believe represents very mild dilation).  There is no serious valvular problem.  He underwent a event monitor as if there were 2018 that showed PACs and PVCs but no atrial fibrillation.  He has hypertension, COPD, treated hypothyroidism and acid reflux. He has a long-standing right bundle branch block.  Past Medical History:  Diagnosis Date   Atrial fibrillation (  [place])    radiofrequency ablation   Emphysema    Hypertension    Thyroid disease     Past Surgical History:  Procedure Laterality Date   CARDIAC SURGERY     CARDIOVERSION N/A 10/24/2019   Procedure: CARDIOVERSION;  Surgeon: Sanda Klein, MD;  Location: [facility];  Service: Cardiovascular;  Laterality: N/A;   CARDIOVERSION N/A 10/22/2020   Procedure: CARDIOVERSION;  Surgeon: Thayer Headings, MD;  Location: [place];  Service: Cardiovascular;  Laterality: N/A;    COLON SURGERY  1999   KNEE ARTHROSCOPY Left 07/2000   LOOP RECORDER IMPLANT  01/16/2010   Medtronic Reveal XT (Dr. Norlene Duel)    RADIOFREQUENCY ABLATION  06/13/2009   SHOULDER ARTHROSCOPY WITH ROTATOR CUFF REPAIR Left 04/1996   TIBIAL TUBERCLERPLASTY  ~ 10/2010   TRANSTHORACIC ECHOCARDIOGRAM  11/19/2011   EF=>55%; mild MR, mild mitral annular calcif; mild TR, normal RSVP; AV mildly sclerotic, mild AV regurg; trace pulm valve regurg     Current Medications: Outpatient Medications Prior to Visit  Medication Sig Dispense Refill   acetaminophen (TYLENOL) 500 MG tablet Take 500 mg by mouth daily as needed for moderate pain.     AMBULATORY NON FORMULARY MEDICATION Please provide toilet riser/elevator with arms.  Diagnosis: Generalized weakness R53.1 1 Units 0   apixaban (ELIQUIS) 2.5 MG TABS tablet TAKE 1 TABLET TWICE DAILY (DOSE CHANGE) 180 tablet 2   Cholecalciferol (VITAMIN D3) 125 MCG (5000 UT) TABS Take 5,000 Units by mouth daily with lunch.     diclofenac Sodium (VOLTAREN) 1 % GEL Apply 2 g topically 4 (four) times daily as needed (pain).     diphenhydramine-acetaminophen (TYLENOL PM) 25-500 MG TABS tablet Take 1 tablet by mouth at bedtime as needed (for sleeplessness).     fluticasone (FLONASE) 50 MCG/ACT nasal spray Place 2 sprays into both nostrils daily. 15.8 mL 0   fluticasone-salmeterol (ADVAIR) 250-50 MCG/ACT AEPB INHALE 1 PUFF TWICE DAILY INTO THE LUNGS (NEED MD APPOINTMENT FOR REFILLS) (Patient taking differently: Inhale 1 puff into the lungs in the morning and at bedtime.) 180 each 1   furosemide (LASIX) 20 MG tablet TAKE 1 TABLET EVERY DAY AS NEEDED FOR  EDEMA 90 tablet 3   Ketotifen Fumarate (ITCHY EYE DROPS OP) Place 1 drop into both eyes daily as needed (itchy eyes).     levothyroxine (SYNTHROID) 137 MCG tablet TAKE 1 TABLET  DAILY BEFORE BREAKFAST. (Patient taking differently: Take 137 mcg by mouth daily before breakfast.) 90 tablet 2   Multiple Vitamin (MULTIVITAMIN) capsule Take  1 capsule by mouth daily.     triamcinolone ointment (KENALOG) 0.1 % Apply 1 Application topically 2 (two) times daily as needed (irritation).     vitamin C (ASCORBIC ACID) 500 MG tablet Take 500 mg by mouth daily with lunch.     zinc gluconate 50 MG tablet Take 50 mg by mouth daily with lunch.     amiodarone (PACERONE) 200 MG tablet TAKE 1 TABLET EVERY DAY (Patient taking differently: Take 200 mg by mouth daily.) 90 tablet 3   albuterol (VENTOLIN HFA) 108 (90 Base) MCG/ACT inhaler Inhale 2 puffs into the lungs every 6 (six) hours as needed for wheezing or shortness of breath. (Patient not taking: Reported on 02/19/2022)     No facility-administered medications prior to visit.     Allergies:   Clindamycin/lincomycin, Dabigatran etexilate mesylate, and Doxycycline   Social History   Socioeconomic History   Marital status: Married    Spouse name: Samuel Mcconnell  Number of children: 2   Years of education: 16   Highest education level: Bachelor's degree (e.g., BA, AB, BS)  Occupational History   Occupation: Retired  Tobacco Use   Smoking status: Former    Years: 46.00    Types: Cigarettes    Quit date: 03/14/2002    Years since quitting: 19.9   Smokeless tobacco: Never   Tobacco comments:    Former smoker 01/23/2021  Vaping Use   Vaping Use: Never used  Substance and Sexual Activity   Alcohol use: No   Drug use: No   Sexual activity: Not Currently    Partners: Female  Other Topics Concern   Not on file  Social History Narrative   Lives at [employer] [facility] with his wife. He is still driving. He enjoys golf and gardening (when is he is able to).   Social Determinants of Health   Financial Resource Strain: Low Risk  (11/19/2021)   Overall Financial Resource Strain (CARDIA)    Difficulty of Paying Living Expenses: Not hard at all  Food Insecurity: No Food Insecurity (11/19/2021)   Hunger Vital Sign    Worried About Running Out of Food in the Last Year: Never true    Ran Out of Food  in the Last Year: Never true  Transportation Needs: No Transportation Needs (11/19/2021)   PRAPARE - Hydrologist (Medical): No    Lack of Transportation (Non-Medical): No  Physical Activity: Sufficiently Active (11/19/2021)   Exercise Vital Sign    Days of Exercise per Week: 3 days    Minutes of Exercise per Session: 50 min  Stress: No Stress Concern Present (11/19/2021)   [place]    Feeling of Stress : Not at all  Social Connections: Moderately Isolated (11/19/2021)   Social Connection and Isolation Panel [NHANES]    Frequency of Communication with Friends and Family: More than three times a week    Frequency of Social Gatherings with Friends and Family: More than three times a week    Attends Religious Services: Never    Marine scientist or Organizations: No    Attends Music therapist: Never    Marital Status: Married     Family History:  The patient's family history includes Atrial fibrillation in his brother, brother, and sister; Cancer in his maternal grandmother; Diabetes in his sister and son; Heart Problems in his sister; Heart disease in his maternal grandfather; Heart failure in his brother, father, mother, and sister; Hypertension in his sister; Pancreatitis in his son.   ROS:   Please see the history of present illness.    ROS All other systems are reviewed and are negative.   PHYSICAL EXAM:   VS:  BP (!) 122/58 (BP Location: Left Arm, Patient Position: Sitting, Cuff Size: Normal)   Pulse (!) 55   Ht '5\' 11"'$  (1.803 m)   Wt 81.5 kg   SpO2 96%   BMI 25.05 kg/m       General: Alert, oriented x3, no distress, appears younger than stated age. Head: no evidence of trauma, PERRL, EOMI, no exophtalmos or lid lag, no myxedema, no xanthelasma; normal ears, nose and oropharynx Neck: normal jugular venous pulsations and no hepatojugular reflux; brisk carotid pulses  without delay and no carotid bruits Chest: clear to auscultation, no signs of consolidation by percussion or palpation, normal fremitus, symmetrical and full respiratory excursions Cardiovascular: normal position and quality of the  apical impulse, regular rhythm, normal first and widely split second heart sounds, no murmurs, rubs or gallops Abdomen: no tenderness or distention, no masses by palpation, no abnormal pulsatility or arterial bruits, normal bowel sounds, no hepatosplenomegaly Extremities: no clubbing, cyanosis; 1+ soft pitting ankle edema bilaterally; 2+ radial, ulnar and brachial pulses bilaterally; 2+ right femoral, posterior tibial and dorsalis pedis pulses; 2+ left femoral, posterior tibial and dorsalis pedis pulses; no subclavian or femoral bruits Neurological: grossly nonfocal Psych: Normal mood and affect     Wt Readings from Last 3 Encounters:  02/19/22 81.5 kg  12/02/21 82.1 kg  11/13/21 82.6 kg      Studies/Labs Reviewed:   ECHO [facility]  Result Date: 09/10/2019 Left Ventricle: Normal left ventricle. Wall thickness is normal. Systolic function is normal. EF: 55-60%. Wall motion cannot be accurately assessed. Doppler parameters are indeterminate for diastolic function.  Right Ventricle: Right ventricle is mildly dilated. Abnormal tricuspid annular plane systolic excursion (TAPSE) <1.7 cm.  Right Atrium: Right atrium is mildly dilated.  Tricuspid Valve: Tricuspid valve structure is normal. There is mild to moderate regurgitation. The right ventricular systolic pressure is moderately elevated (50-59 mmHg).   [facility] CT 09/07/2019  IMPRESSION:  1. No rib fracture or pneumothorax identified.  2. Stable atherosclerosis.  3. No definite soft tissue organ injury identified.  4. No significant change hypodense focus left kidney.  5. No free air or fluid identified.    EKG:  EKG is ordered today showed sinus bradycardia 55 bpm, first-degree AV block (PR interval 242 ms),  RBBB (QRS 146 ms), QTc 497 ms.  Recent Labs: 08/28/2019 TSH 3.53 Hemoglobin 13.5  BMET    Component Value Date/Time   NA 139 11/13/2021 0000   NA 140 04/11/2021 1119   K 5.0 11/13/2021 0000   CL 104 11/13/2021 0000   CO2 29 11/13/2021 0000   GLUCOSE 99 11/13/2021 0000   BUN 22 11/13/2021 0000   BUN 22 04/11/2021 1119   CREATININE 1.53 (H) 11/13/2021 0000   CALCIUM 9.0 11/13/2021 0000   GFRNONAA 50 (L) 10/29/2021 0546   GFRNONAA 38 (L) 05/07/2020 1130   GFRAA 44 (L) 05/07/2020 1130     Lipid Panel 08/28/2019 Cholesterol 160, triglycerides 79, HDL 41, LDL 104  Lipid Panel  No results found for: "CHOL", "TRIG", "HDL", "CHOLHDL", "VLDL", "LDLCALC", "LDLDIRECT", "LABVLDL"    Additional studies/ records that were reviewed today include:  Notes from hospitalization at [facility]. Spirometry 05/16/2019 showing FEV1 1.77 L (68%), FVC 2.51 L) 70%), mild restrictive ventilatory impairment unchanged  ASSESSMENT:    1. PAF (paroxysmal atrial fibrillation) (HCC)      PLAN:  In order of problems listed above:  AFib/atypical atrial flutter: No recurrence in over 12 months on amiodarone.  Anticoagulated appropriately: CHADSVasc at least 4 (age 67, HTN, CHF). 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  I think we can try to reduce the amiodarone to 200 mg just 5 days a week.  Asymptomatic.  No indication for pacemaker at this time. CHF: Symptoms of hypervolemia are very well-controlled on a very low-dose of loop diuretic.  Avoid excessive diuresis since this could worsen his tendency to have orthostatic hypotension. COPD:  on long-acting bronchodilators.  Will be stopping his beta-blocker altogether.  Frequent episodes of pneumonia over the last few years may be related to aspiration as well as underlying COPD. HTN: When taking losartan he was having worsening problems with orthostatic dizziness and so we stopped this.  Well-controlled  without  any specific medication. CKD 3a: Slight progression.  Baseline GFR seems to be around 45, creatinine 80 year ago was 1.58 and in August of this year 1.53.  Medication Adjustments/Labs and Tests Ordered: Current medicines are reviewed at length with the patient today.  Concerns regarding medicines are outlined above.  Medication changes, Labs and Tests ordered today are listed in the Patient Instructions below. Patient Instructions  Medication Instructions:  DECREASE Amiodarone 200 mg to 5 days a week. You may choose which 5 days to take 1 tablet  *If you need a refill on your cardiac medications before your next appointment, please call your pharmacy*  Lab Work: Your physician recommends that you return for lab work in February 2024:  CMP TSH   If you have labs (blood work) drawn today and your tests are completely normal, you will receive your results only by: [employer] (if you have [place]) OR A paper copy in the mail If you have any lab test that is abnormal or we need to change your treatment, we will call you to review the results.  Testing/Procedures: NONE ordered at this time of appointment   Follow-Up: At [facility], you and your health needs are our priority.  As part of our continuing mission to provide you with exceptional heart care, we have created designated Provider Care Teams.  These Care Teams include your primary Cardiologist (physician) and Advanced Practice Providers (APPs -  Physician Assistants and Nurse Practitioners) who all work together to provide you with the care you need, when you need it.  Your next appointment:   1 year(s)  The format for your next appointment:   In Person  Provider:   Sanda Klein, MD     Other Instructions  Important Information About Sugar         Signed, Sanda Klein, MD  02/19/2022 1:17 PM    [address], [place], [place]  [address] Phone: [phone]; Fax:  [phone]

## 2022-03-09 DIAGNOSIS — L97921 Non-pressure chronic ulcer of unspecified part of left lower leg limited to breakdown of skin: Secondary | ICD-10-CM | POA: Diagnosis not present

## 2022-03-09 DIAGNOSIS — B351 Tinea unguium: Secondary | ICD-10-CM | POA: Diagnosis not present

## 2022-03-09 DIAGNOSIS — M79675 Pain in left toe(s): Secondary | ICD-10-CM | POA: Diagnosis not present

## 2022-04-13 ENCOUNTER — Encounter: Payer: Self-pay | Admitting: Family Medicine

## 2022-04-14 ENCOUNTER — Other Ambulatory Visit: Payer: Self-pay | Admitting: Family Medicine

## 2022-04-14 MED ORDER — ROPINIROLE HCL 0.25 MG PO TABS: 0.2500 mg | ORAL_TABLET | Freq: Every day | ORAL | 6 refills | Status: DC

## 2022-04-15 ENCOUNTER — Other Ambulatory Visit: Payer: Self-pay | Admitting: Family Medicine

## 2022-04-15 DIAGNOSIS — J309 Allergic rhinitis, unspecified: Secondary | ICD-10-CM

## 2022-04-22 DIAGNOSIS — L821 Other seborrheic keratosis: Secondary | ICD-10-CM | POA: Diagnosis not present

## 2022-04-22 DIAGNOSIS — L814 Other melanin hyperpigmentation: Secondary | ICD-10-CM | POA: Diagnosis not present

## 2022-04-22 DIAGNOSIS — L578 Other skin changes due to chronic exposure to nonionizing radiation: Secondary | ICD-10-CM | POA: Diagnosis not present

## 2022-04-22 DIAGNOSIS — L570 Actinic keratosis: Secondary | ICD-10-CM | POA: Diagnosis not present

## 2022-05-13 ENCOUNTER — Other Ambulatory Visit: Payer: Self-pay | Admitting: Family Medicine

## 2022-05-18 DIAGNOSIS — I480 Paroxysmal atrial fibrillation: Secondary | ICD-10-CM | POA: Diagnosis not present

## 2022-05-19 LAB — COMPREHENSIVE METABOLIC PANEL
ALT: 19 IU/L (ref 0–44)
AST: 29 IU/L (ref 0–40)
Albumin/Globulin Ratio: 1.8 (ref 1.2–2.2)
Albumin: 4 g/dL (ref 3.6–4.6)
Alkaline Phosphatase: 80 IU/L (ref 44–121)
BUN/Creatinine Ratio: 17 (ref 10–24)
BUN: 26 mg/dL (ref 10–36)
Bilirubin Total: 0.4 mg/dL (ref 0.0–1.2)
CO2: 27 mmol/L (ref 20–29)
Calcium: 9.2 mg/dL (ref 8.6–10.2)
Chloride: 100 mmol/L (ref 96–106)
Creatinine, Ser: 1.53 mg/dL — ABNORMAL HIGH (ref 0.76–1.27)
Globulin, Total: 2.2 g/dL (ref 1.5–4.5)
Glucose: 93 mg/dL (ref 70–99)
Potassium: 4.9 mmol/L (ref 3.5–5.2)
Sodium: 141 mmol/L (ref 134–144)
Total Protein: 6.2 g/dL (ref 6.0–8.5)
eGFR: 42 mL/min/{1.73_m2} — ABNORMAL LOW (ref >59)

## 2022-05-19 LAB — TSH: TSH: 3.01 u[IU]/mL (ref 0.450–4.500)

## 2022-06-05 DIAGNOSIS — L84 Corns and callosities: Secondary | ICD-10-CM | POA: Diagnosis not present

## 2022-06-05 DIAGNOSIS — B351 Tinea unguium: Secondary | ICD-10-CM | POA: Diagnosis not present

## 2022-06-05 DIAGNOSIS — M79675 Pain in left toe(s): Secondary | ICD-10-CM | POA: Diagnosis not present

## 2022-06-30 ENCOUNTER — Other Ambulatory Visit: Payer: Self-pay | Admitting: Family Medicine

## 2022-06-30 DIAGNOSIS — J309 Allergic rhinitis, unspecified: Secondary | ICD-10-CM

## 2022-07-25 ENCOUNTER — Other Ambulatory Visit: Payer: Self-pay | Admitting: Family Medicine

## 2022-07-25 DIAGNOSIS — E039 Hypothyroidism, unspecified: Secondary | ICD-10-CM

## 2022-08-20 ENCOUNTER — Other Ambulatory Visit: Payer: Self-pay | Admitting: Family Medicine

## 2022-08-26 ENCOUNTER — Other Ambulatory Visit: Payer: Self-pay | Admitting: Family Medicine

## 2022-09-04 DIAGNOSIS — B351 Tinea unguium: Secondary | ICD-10-CM | POA: Diagnosis not present

## 2022-09-04 DIAGNOSIS — L84 Corns and callosities: Secondary | ICD-10-CM | POA: Diagnosis not present

## 2022-09-04 DIAGNOSIS — M79675 Pain in left toe(s): Secondary | ICD-10-CM | POA: Diagnosis not present

## 2022-09-09 ENCOUNTER — Ambulatory Visit (INDEPENDENT_AMBULATORY_CARE_PROVIDER_SITE_OTHER): Payer: Medicare HMO | Admitting: Family Medicine

## 2022-09-09 ENCOUNTER — Encounter: Payer: Self-pay | Admitting: Family Medicine

## 2022-09-09 VITALS — BP 132/78 | HR 69 | Ht 71.0 in | Wt 179.0 lb

## 2022-09-09 DIAGNOSIS — E041 Nontoxic single thyroid nodule: Secondary | ICD-10-CM

## 2022-09-09 DIAGNOSIS — G2581 Restless legs syndrome: Secondary | ICD-10-CM

## 2022-09-09 DIAGNOSIS — I4819 Other persistent atrial fibrillation: Secondary | ICD-10-CM | POA: Diagnosis not present

## 2022-09-09 DIAGNOSIS — J449 Chronic obstructive pulmonary disease, unspecified: Secondary | ICD-10-CM

## 2022-09-09 MED ORDER — TRELEGY ELLIPTA 100-62.5-25 MCG/ACT IN AEPB: 1.0000 | INHALATION_SPRAY | Freq: Every day | RESPIRATORY_TRACT | 0 refills | Status: DC

## 2022-09-09 NOTE — Assessment & Plan Note (Signed)
Remains off of antiarrhythmics.  Stable at this time.  Continue eliquis for anticoagulation.

## 2022-09-09 NOTE — Assessment & Plan Note (Signed)
He is having some difficulty with Advair.  Changed to Trelegy.  I do not see any other alternatives that were not dry powder inhalers on his formulary.

## 2022-09-09 NOTE — Assessment & Plan Note (Signed)
Requip is effective for him however he is taking it just before bed therefore it is not working for a couple of hours.  I recommend that he implement a strategy of taking 2 hours before bed

## 2022-09-09 NOTE — Patient Instructions (Addendum)
Trelegy: Inhale once daily. Rinse mouth after use.   Let me know if this is  working well for you.   Try taking Requip 2 hours prior to bedtime for this to be most effective.

## 2022-09-09 NOTE — Progress Notes (Signed)
Samuel Mcconnell - 89 y.o. male MRN [id number]  Date of birth: 1928-01-11  Subjective Chief Complaint  Patient presents with   Knee Pain    HPI Samuel Mcconnell is a 89 year old male here today for follow-up visit.  Overall he is doing pretty well.  He has complaints of his Requip not working until around 1 AM.  Feels like this is affecting his sleep patterns.  I ask what time he takes this and he reports just before bed.  He usually goes to bed around 10 PM.  He does have a history of hypothyroidism and is currently taking levothyroxine.  He has noticed a nodule in his neck which has changed in size over the past few months.  Denies any pain associated with this.    He is having difficulty with his Advair inhaler.  Reports not he has been through several inhalers but does not feel like he is getting the medication now.  He did try Trelegy at one point in the past and this seemed to work pretty well for him.    ROS:  A comprehensive ROS was completed and negative except as noted per HPI  Past Medical History:  Diagnosis Date   Atrial fibrillation (HCC)    radiofrequency ablation   Emphysema    Hypertension    Thyroid disease     Past Surgical History:  Procedure Laterality Date   CARDIAC SURGERY     CARDIOVERSION N/A 10/24/2019   Procedure: CARDIOVERSION;  Surgeon: Thurmon Fair, MD;  Location: [facility];  Service: Cardiovascular;  Laterality: N/A;   CARDIOVERSION N/A 10/22/2020   Procedure: CARDIOVERSION;  Surgeon: Vesta Mixer, MD;  Location: [facility];  Service: Cardiovascular;  Laterality: N/A;   COLON SURGERY  1999   KNEE ARTHROSCOPY Left 07/2000   LOOP RECORDER IMPLANT  01/16/2010   Medtronic Reveal XT (Dr. Claudia Desanctis)    RADIOFREQUENCY ABLATION  06/13/2009   SHOULDER ARTHROSCOPY WITH ROTATOR CUFF REPAIR Left 04/1996   TIBIAL TUBERCLERPLASTY  ~ 10/2010   TRANSTHORACIC ECHOCARDIOGRAM  11/19/2011   EF=>55%; mild MR, mild mitral annular calcif; mild TR, normal RSVP; AV  mildly sclerotic, mild AV regurg; trace pulm valve regurg     Social History   Socioeconomic History   Marital status: Married    Spouse name: [name]   Number of children: 2   Years of education: 16   Highest education level: Bachelor's degree (e.g., BA, AB, BS)  Occupational History   Occupation: Retired  Tobacco Use   Smoking status: Former    Years: 46    Types: Cigarettes    Quit date: 03/14/2002    Years since quitting: 20.5   Smokeless tobacco: Never   Tobacco comments:    Former smoker 01/23/2021  Vaping Use   Vaping Use: Never used  Substance and Sexual Activity   Alcohol use: No   Drug use: No   Sexual activity: Not Currently    Partners: Female  Other Topics Concern   Not on file  Social History Narrative   Lives at [employer] with his wife. He is still driving. He enjoys golf and gardening (when is he is able to).   Social Determinants of Health   Financial Resource Strain: Low Risk  (09/08/2022)   Overall Financial Resource Strain (CARDIA)    Difficulty of Paying Living Expenses: Not hard at all  Food Insecurity: No Food Insecurity (09/08/2022)   Hunger Vital Sign    Worried About Programme researcher, broadcasting/film/video in  the Last Year: Never true    Ran Out of Food in the Last Year: Never true  Transportation Needs: No Transportation Needs (09/08/2022)   PRAPARE - Administrator, Civil Service (Medical): No    Lack of Transportation (Non-Medical): No  Physical Activity: Inactive (09/08/2022)   Exercise Vital Sign    Days of Exercise per Week: 0 days    Minutes of Exercise per Session: 50 min  Stress: No Stress Concern Present (09/08/2022)   [employer] of Occupational Health - Occupational Stress Questionnaire    Feeling of Stress : Only a little  Social Connections: Socially Integrated (09/08/2022)   Social Connection and Isolation Panel [NHANES]    Frequency of Communication with Friends and Family: More than three times a week    Frequency of  Social Gatherings with Friends and Family: More than three times a week    Attends Religious Services: More than 4 times per year    Active Member of Clubs or Organizations: Yes    Attends Banker Meetings: 1 to 4 times per year    Marital Status: Married    Family History  Problem Relation Age of Onset   Heart failure Father        MI - died @ 36   Heart failure Mother    Diabetes Son    Pancreatitis Son        also ETOH abuse    Cancer Maternal Grandmother    Heart disease Maternal Grandfather    Atrial fibrillation Brother        dx'ed age 67   Atrial fibrillation Brother        dx'ed age 41   Heart failure Brother    Atrial fibrillation Sister    Hypertension Sister    Heart Problems Sister    Diabetes Sister    Heart failure Sister     Health Maintenance  Topic Date Due   COVID-19 Vaccine (6 - 2023-24 season) 12/10/2022 (Originally 11/21/2021)   INFLUENZA VACCINE  10/22/2022   Medicare Annual Wellness (AWV)  11/20/2022   DTaP/Tdap/Td (3 - Td or Tdap) 09/06/2029   Pneumonia Vaccine 4+ Years old  Completed   Zoster Vaccines- Shingrix  Completed   HPV VACCINES  Aged Out     ----------------------------------------------------------------------------------------------------------------------------------------------------------------------------------------------------------------- Physical Exam BP 132/78 (BP Location: Left Arm, Patient Position: Sitting, Cuff Size: Normal)   Pulse 69   Ht 5\' 11"  (1.803 m)   Wt 179 lb (81.2 kg)   SpO2 92%   BMI 24.97 kg/m   Physical Exam Constitutional:      Appearance: Normal appearance.  HENT:     Head: Normocephalic and atraumatic.  Eyes:     General: No scleral icterus. Neck:     Comments: Nodule noted along the cricothyroid cartilage. Cardiovascular:     Rate and Rhythm: Normal rate and regular rhythm.  Pulmonary:     Effort: Pulmonary effort is normal.     Breath sounds: Normal breath sounds.   Neurological:     General: No focal deficit present.     Mental Status: He is alert.  Psychiatric:        Mood and Affect: Mood normal.        Behavior: Behavior normal.     ------------------------------------------------------------------------------------------------------------------------------------------------------------------------------------------------------------------- Assessment and Plan  Persistent atrial fibrillation (HCC) Remains off of antiarrhythmics.  Stable at this time.  Continue eliquis for anticoagulation.  Chronic obstructive pulmonary disease (HCC) He is having some difficulty with Advair.  Changed to Trelegy.  I do not see any other alternatives that were not dry powder inhalers on his formulary.  Thyroid nodule Ultrasound of the thyroid ordered.  Restless legs Requip is effective for him however he is taking it just before bed therefore it is not working for a couple of hours.  I recommend that he implement a strategy of taking 2 hours before bed   Meds ordered this encounter  Medications   Fluticasone-Umeclidin-Vilant (TRELEGY ELLIPTA) 100-62.5-25 MCG/ACT AEPB    Sig: Inhale 1 puff into the lungs daily.    Dispense:  2 each    Refill:  0    Lot: [id number] Exp: 08/2023    No follow-ups on file.    This visit occurred during the SARS-CoV-2 public health emergency.  Safety protocols were in place, including screening questions prior to the visit, additional usage of staff PPE, and extensive cleaning of exam room while observing appropriate contact time as indicated for disinfecting solutions.

## 2022-09-09 NOTE — Assessment & Plan Note (Signed)
Ultrasound of the thyroid ordered.

## 2022-09-10 ENCOUNTER — Ambulatory Visit (INDEPENDENT_AMBULATORY_CARE_PROVIDER_SITE_OTHER): Payer: Medicare HMO

## 2022-09-10 DIAGNOSIS — E041 Nontoxic single thyroid nodule: Secondary | ICD-10-CM | POA: Diagnosis not present

## 2022-09-14 ENCOUNTER — Other Ambulatory Visit: Payer: Self-pay | Admitting: Family Medicine

## 2022-09-16 DIAGNOSIS — L821 Other seborrheic keratosis: Secondary | ICD-10-CM | POA: Diagnosis not present

## 2022-09-16 DIAGNOSIS — D225 Melanocytic nevi of trunk: Secondary | ICD-10-CM | POA: Diagnosis not present

## 2022-09-16 DIAGNOSIS — L814 Other melanin hyperpigmentation: Secondary | ICD-10-CM | POA: Diagnosis not present

## 2022-09-16 DIAGNOSIS — L570 Actinic keratosis: Secondary | ICD-10-CM | POA: Diagnosis not present

## 2022-09-16 DIAGNOSIS — L578 Other skin changes due to chronic exposure to nonionizing radiation: Secondary | ICD-10-CM | POA: Diagnosis not present

## 2022-09-16 DIAGNOSIS — B079 Viral wart, unspecified: Secondary | ICD-10-CM | POA: Diagnosis not present

## 2022-09-16 DIAGNOSIS — D485 Neoplasm of uncertain behavior of skin: Secondary | ICD-10-CM | POA: Diagnosis not present

## 2022-09-21 ENCOUNTER — Other Ambulatory Visit: Payer: Self-pay | Admitting: Cardiovascular Disease

## 2022-09-21 DIAGNOSIS — I480 Paroxysmal atrial fibrillation: Secondary | ICD-10-CM

## 2022-09-21 NOTE — Telephone Encounter (Signed)
Prescription refill request for Eliquis received. Indication: PAF Last office visit: 02/19/22  Judie Petit Croitoru MD Scr: 1.53 on 05/18/22 Age: 89 Weight: 81.5kg  Based on above findings Eliquis 2.5mg  twice daily is the appropriate dose.  Refill approved.

## 2022-10-22 ENCOUNTER — Ambulatory Visit (INDEPENDENT_AMBULATORY_CARE_PROVIDER_SITE_OTHER): Payer: Medicare HMO

## 2022-10-22 ENCOUNTER — Encounter: Payer: Self-pay | Admitting: Family Medicine

## 2022-10-22 ENCOUNTER — Ambulatory Visit (INDEPENDENT_AMBULATORY_CARE_PROVIDER_SITE_OTHER): Payer: Medicare HMO | Admitting: Family Medicine

## 2022-10-22 VITALS — BP 116/54 | HR 64 | Ht 71.0 in | Wt 178.0 lb

## 2022-10-22 DIAGNOSIS — W19XXXA Unspecified fall, initial encounter: Secondary | ICD-10-CM

## 2022-10-22 DIAGNOSIS — R0781 Pleurodynia: Secondary | ICD-10-CM | POA: Diagnosis not present

## 2022-10-22 DIAGNOSIS — I771 Stricture of artery: Secondary | ICD-10-CM | POA: Diagnosis not present

## 2022-10-22 NOTE — Progress Notes (Signed)
Samuel Mcconnell - 89 y.o. male MRN [id number]  Date of birth: 1928-01-08  Subjective Chief Complaint  Patient presents with   Fall    HPI Samuel Mcconnell is a 89 year old male here today with complaint of fall.  Recently had a fall after stumbling while getting out of bed.  Fell against his bed rail.  Did not really notice any pain initially but a day or 2 afterwards he had pain along the posterior ribs with bruising.  He does have some pain with taking a deep breath but denies shortness of breath.  He has not had any fever or chills.  ROS:  A comprehensive ROS was completed and negative except as noted per HPI  Allergies  Allergen Reactions   Clindamycin/Lincomycin Other (See Comments)    Patient "felt weird"    Dabigatran Etexilate Mesylate Other (See Comments)    "felt weird" (name brand Pradaxa)   Doxycycline Other (See Comments)    Dyspepsia    Past Medical History:  Diagnosis Date   Atrial fibrillation (HCC)    radiofrequency ablation   Emphysema    Hypertension    Thyroid disease     Past Surgical History:  Procedure Laterality Date   CARDIAC SURGERY     CARDIOVERSION N/A 10/24/2019   Procedure: CARDIOVERSION;  Surgeon: Thurmon Fair, MD;  Location: [facility] ENDOSCOPY;  Service: Cardiovascular;  Laterality: N/A;   CARDIOVERSION N/A 10/22/2020   Procedure: CARDIOVERSION;  Surgeon: Vesta Mixer, MD;  Location: [facility] ENDOSCOPY;  Service: Cardiovascular;  Laterality: N/A;   COLON SURGERY  1999   KNEE ARTHROSCOPY Left 07/2000   LOOP RECORDER IMPLANT  01/16/2010   Medtronic Reveal XT (Dr. Claudia Desanctis)    RADIOFREQUENCY ABLATION  06/13/2009   SHOULDER ARTHROSCOPY WITH ROTATOR CUFF REPAIR Left 04/1996   TIBIAL TUBERCLERPLASTY  ~ 10/2010   TRANSTHORACIC ECHOCARDIOGRAM  11/19/2011   EF=>55%; mild MR, mild mitral annular calcif; mild TR, normal RSVP; AV mildly sclerotic, mild AV regurg; trace pulm valve regurg     Social History   Socioeconomic History   Marital status: Married     Spouse name: [name]   Number of children: 2   Years of education: 16   Highest education level: Bachelor's degree (e.g., BA, AB, BS)  Occupational History   Occupation: Retired  Tobacco Use   Smoking status: Former    Current packs/day: 0.00    Types: Cigarettes    Start date: 03/14/1956    Quit date: 03/14/2002    Years since quitting: 20.6   Smokeless tobacco: Never   Tobacco comments:    Former smoker 01/23/2021  Vaping Use   Vaping status: Never Used  Substance and Sexual Activity   Alcohol use: No   Drug use: No   Sexual activity: Not Currently    Partners: Female  Other Topics Concern   Not on file  Social History Narrative   Lives at [facility] with his wife. He is still driving. He enjoys golf and gardening (when is he is able to).   Social Determinants of Health   Financial Resource Strain: Low Risk  (09/08/2022)   Overall Financial Resource Strain (CARDIA)    Difficulty of Paying Living Expenses: Not hard at all  Food Insecurity: No Food Insecurity (09/08/2022)   Hunger Vital Sign    Worried About Running Out of Food in the Last Year: Never true    Ran Out of Food in the Last Year: Never true  Transportation Needs: No Transportation Needs (09/08/2022)  PRAPARE - Administrator, Civil Service (Medical): No    Lack of Transportation (Non-Medical): No  Physical Activity: Inactive (09/08/2022)   Exercise Vital Sign    Days of Exercise per Week: 0 days    Minutes of Exercise per Session: 50 min  Stress: No Stress Concern Present (09/08/2022)   [employer] of Occupational Health - Occupational Stress Questionnaire    Feeling of Stress : Only a little  Social Connections: Socially Integrated (09/08/2022)   Social Connection and Isolation Panel [NHANES]    Frequency of Communication with Friends and Family: More than three times a week    Frequency of Social Gatherings with Friends and Family: More than three times a week    Attends Religious  Services: More than 4 times per year    Active Member of Clubs or Organizations: Yes    Attends Banker Meetings: 1 to 4 times per year    Marital Status: Married    Family History  Problem Relation Age of Onset   Heart failure Father        MI - died @ 82   Heart failure Mother    Diabetes Son    Pancreatitis Son        also ETOH abuse    Cancer Maternal Grandmother    Heart disease Maternal Grandfather    Atrial fibrillation Brother        dx'ed age 8   Atrial fibrillation Brother        dx'ed age 65   Heart failure Brother    Atrial fibrillation Sister    Hypertension Sister    Heart Problems Sister    Diabetes Sister    Heart failure Sister     Health Maintenance  Topic Date Due   INFLUENZA VACCINE  10/22/2022   Medicare Annual Wellness (AWV)  11/20/2022   COVID-19 Vaccine (6 - 2023-24 season) 12/10/2022 (Originally 11/21/2021)   DTaP/Tdap/Td (3 - Td or Tdap) 09/06/2029   Pneumonia Vaccine 65+ Years old  Completed   Zoster Vaccines- Shingrix  Completed   HPV VACCINES  Aged Out     ----------------------------------------------------------------------------------------------------------------------------------------------------------------------------------------------------------------- Physical Exam BP (!) 116/54 (BP Location: Left Arm, Patient Position: Sitting, Cuff Size: Normal)   Pulse 64   Ht 5\' 11"  (1.803 m)   Wt 178 lb (80.7 kg)   SpO2 95%   BMI 24.83 kg/m   Physical Exam Constitutional:      Appearance: Normal appearance.  Cardiovascular:     Rate and Rhythm: Normal rate and regular rhythm.  Pulmonary:     Effort: Pulmonary effort is normal.     Breath sounds: Normal breath sounds.     Comments: Bruising along posterior ribs with small hematoma.  He does have tenderness along this area. Musculoskeletal:     Cervical back: Neck supple.  Neurological:     General: No focal deficit present.     Mental Status: He is alert.      ------------------------------------------------------------------------------------------------------------------------------------------------------------------------------------------------------------------- Assessment and Plan  Rib pain on left side Chest x-ray with left rib detail ordered.  You may continue Tylenol as needed.  He does get some improvement with heat which he may continue as needed.  He does have an incentive spirometer which I encouraged him to use frequently.   No orders of the defined types were placed in this encounter.   No follow-ups on file.    This visit occurred during the SARS-CoV-2 public health emergency.  Safety protocols were in  place, including screening questions prior to the visit, additional usage of staff PPE, and extensive cleaning of exam room while observing appropriate contact time as indicated for disinfecting solutions.

## 2022-10-22 NOTE — Assessment & Plan Note (Signed)
Chest x-ray with left rib detail ordered.  You may continue Tylenol as needed.  He does get some improvement with heat which he may continue as needed.  He does have an incentive spirometer which I encouraged him to use frequently.

## 2022-11-06 ENCOUNTER — Other Ambulatory Visit: Payer: Self-pay | Admitting: Family Medicine

## 2022-11-06 NOTE — Telephone Encounter (Signed)
Forwarding message to Dr. Tamera Punt covering Dr. Ashley Royalty.  Requesting rx rf of Ropinirole but in checking pt chart  Shows that was just refilled on 09/14/2022 for #30 and 5 rf Called [facility]  and was told that patient picked up the full prescription amount on 10/02/2022 Pharmacy states they filled Qty #180  and listed as a 45 day supply

## 2022-11-09 ENCOUNTER — Other Ambulatory Visit: Payer: Self-pay

## 2022-11-09 NOTE — Telephone Encounter (Signed)
Forwarded message to Dr. Ashley Royalty.

## 2022-11-09 NOTE — Telephone Encounter (Signed)
Requesting rx rf of Ropinirole but in checking pt chart  Shows that was just refilled on 09/14/2022 for #30 and 5 rf Called [facility]  and was told that patient picked up the full prescription amount on 10/02/2022 Pharmacy states they filled Qty #180  and listed as a 45 day supply   Spoke with patient. States he uses a maximum of 3 tablets per day. Told the patient that would likely be resent a 90/d supply. He is agreeable. States his sleep has improved since taking the medication.

## 2022-11-10 MED ORDER — ROPINIROLE HCL 0.25 MG PO TABS: 0.7500 mg | ORAL_TABLET | Freq: Every day | ORAL | 1 refills | Status: DC

## 2022-11-18 ENCOUNTER — Encounter: Payer: Self-pay | Admitting: Family Medicine

## 2022-11-24 ENCOUNTER — Other Ambulatory Visit: Payer: Self-pay | Admitting: Family Medicine

## 2022-11-24 ENCOUNTER — Encounter: Payer: Self-pay | Admitting: Family Medicine

## 2022-11-24 MED ORDER — TRELEGY ELLIPTA 100-62.5-25 MCG/ACT IN AEPB: 1.0000 | INHALATION_SPRAY | Freq: Every day | RESPIRATORY_TRACT | 3 refills | Status: DC

## 2022-11-24 NOTE — Telephone Encounter (Signed)
Rx sent over

## 2022-11-25 ENCOUNTER — Ambulatory Visit (INDEPENDENT_AMBULATORY_CARE_PROVIDER_SITE_OTHER): Payer: Medicare HMO | Admitting: Family Medicine

## 2022-11-25 DIAGNOSIS — Z0000 Encounter for general adult medical examination without abnormal findings: Secondary | ICD-10-CM | POA: Diagnosis not present

## 2022-11-25 NOTE — Progress Notes (Signed)
MEDICARE ANNUAL WELLNESS VISIT  11/25/2022  Telephone Visit Disclaimer This Medicare AWV was conducted by telephone due to national recommendations for restrictions regarding the COVID-19 Pandemic (e.g. social distancing).  I verified, using two identifiers, that I am speaking with Samuel Mcconnell or their authorized healthcare agent. I discussed the limitations, risks, security, and privacy concerns of performing an evaluation and management service by telephone and the potential availability of an in-person appointment in the future. The patient expressed understanding and agreed to proceed.  Location of Patient: Home Location of Provider (nurse):  In the office.  Subjective:    Samuel Mcconnell is a 89 y.o. male patient of Everrett Coombe, DO who had a Medicare Annual Wellness Visit today via telephone. Samuel Mcconnell is Retired and lives with their spouse at [employer] independent facility. he has 2 children. he reports that he is socially active and does interact with friends/family regularly. he is moderately physically active and enjoys walking and going to the gym .  Patient Care Team: Everrett Coombe, DO as PCP - General (Family Medicine) Thurmon Fair, MD as PCP - Cardiology (Cardiology) Gabriel Carina, [facility] as Pharmacist (Pharmacist)     11/25/2022   11:10 AM 11/19/2021   11:11 AM 10/25/2021    6:10 PM 07/03/2021    8:53 AM 09/16/2020   10:12 AM 11/22/2019    2:03 PM  Advanced Directives  Does Patient Have a Medical Advance Directive? Yes Yes Yes Yes Yes Yes  Type of Advance Directive Living will Living will Healthcare Power of [employer] Power of [place];Living will Living will;Healthcare Power of Attorney Living will;Healthcare Power of Attorney  Does patient want to make changes to medical advance directive? No - Patient declined No - Patient declined No - Patient declined No - Patient declined No - Patient declined No - Patient declined  Copy of Healthcare Power of Attorney  in Chart?  No - copy requested No - copy requested No - copy requested No - copy requested     Hospital Utilization Over the Past 12 Months: # of hospitalizations or ER visits: 0 # of surgeries: 0  Review of Systems    Patient reports that his overall health is better compared to last year.  History obtained from chart review and the patient  Patient Reported Readings (BP, Pulse, CBG, Weight, etc) none Per patient no change in vitals since last visit, unable to obtain new vitals due to telehealth visit  Pain Assessment Pain : No/denies pain     Current Medications & Allergies (verified) Allergies as of 11/25/2022       Reactions   Clindamycin/lincomycin Other (See Comments)   Patient "felt weird"   Dabigatran Etexilate Mesylate Other (See Comments)   "felt weird" (name brand Pradaxa)   Doxycycline Other (See Comments)   Dyspepsia        Medication List        Accurate as of November 25, 2022 11:19 AM. If you have any questions, ask your nurse or doctor.          acetaminophen 500 MG tablet Commonly known as: TYLENOL Take 500 mg by mouth daily as needed for moderate pain.   albuterol 108 (90 Base) MCG/ACT inhaler Commonly known as: VENTOLIN HFA Inhale 2 puffs into the lungs every 6 (six) hours as needed for wheezing or shortness of breath.   AMBULATORY NON FORMULARY MEDICATION Please provide toilet riser/elevator with arms.  Diagnosis: Generalized weakness R53.1   amiodarone 200 MG tablet Commonly  known as: PACERONE Take 1 tablet by mouth day 5 days a week. You may choose what 5 days to take 1 tablet daily.   diphenhydramine-acetaminophen 25-500 MG Tabs tablet Commonly known as: TYLENOL PM Take 1 tablet by mouth at bedtime as needed (for sleeplessness).   Eliquis 2.5 MG Tabs tablet Generic drug: apixaban TAKE 1 TABLET TWICE DAILY (DOSE CHANGE)   fluticasone 50 MCG/ACT nasal spray Commonly known as: FLONASE Place 2 sprays into both nostrils daily.    furosemide 20 MG tablet Commonly known as: LASIX TAKE 1 TABLET EVERY DAY AS NEEDED FOR  EDEMA   ITCHY EYE DROPS OP Place 1 drop into both eyes daily as needed (itchy eyes).   levothyroxine 137 MCG tablet Commonly known as: SYNTHROID TAKE 1 TABLET EVERY DAY BEFORE BREAKFAST (NEED LABS AND MD APPOINTMENT)   multivitamin capsule Take 1 capsule by mouth daily.   rOPINIRole 0.25 MG tablet Commonly known as: REQUIP Take 3 tablets (0.75 mg total) by mouth at bedtime.   Trelegy Ellipta 100-62.5-25 MCG/ACT Aepb Generic drug: Fluticasone-Umeclidin-Vilant Inhale 1 puff into the lungs daily.   triamcinolone ointment 0.1 % Commonly known as: KENALOG Apply 1 Application topically 2 (two) times daily as needed (irritation).   Vitamin D3 125 MCG (5000 UT) Tabs Take 5,000 Units by mouth daily with lunch.   Voltaren 1 % Gel Generic drug: diclofenac Sodium Apply 2 g topically 4 (four) times daily as needed (pain).   zinc gluconate 50 MG tablet Take 50 mg by mouth daily with lunch.        History (reviewed): Past Medical History:  Diagnosis Date   Arthritis    Atrial fibrillation (HCC)    radiofrequency ablation   Cataract    CHF (congestive heart failure) (HCC)    Clotting disorder (HCC)    due to medication   Emphysema    Emphysema of lung (HCC)    Hypertension    Thyroid disease    Ulcer    GI years prior   Past Surgical History:  Procedure Laterality Date   CARDIAC SURGERY     CARDIOVERSION N/A 10/24/2019   Procedure: CARDIOVERSION;  Surgeon: Thurmon Fair, MD;  Location: [facility] ENDOSCOPY;  Service: Cardiovascular;  Laterality: N/A;   CARDIOVERSION N/A 10/22/2020   Procedure: CARDIOVERSION;  Surgeon: Vesta Mixer, MD;  Location: [facility] ENDOSCOPY;  Service: Cardiovascular;  Laterality: N/A;   COLON SURGERY  03/23/1997   EYE SURGERY     KNEE ARTHROSCOPY Left 07/21/2000   LOOP RECORDER IMPLANT  01/16/2010   Medtronic Reveal XT (Dr. Claudia Desanctis)    RADIOFREQUENCY  ABLATION  06/13/2009   SHOULDER ARTHROSCOPY WITH ROTATOR CUFF REPAIR Left 04/23/1996   TIBIAL TUBERCLERPLASTY  ~ 10/2010   TRANSTHORACIC ECHOCARDIOGRAM  11/19/2011   EF=>55%; mild MR, mild mitral annular calcif; mild TR, normal RSVP; AV mildly sclerotic, mild AV regurg; trace pulm valve regurg    Family History  Problem Relation Age of Onset   Heart failure Mother    Heart failure Father        MI - died @ 37   Atrial fibrillation Sister    Hypertension Sister    Heart Problems Sister    Diabetes Sister    Heart failure Sister    Atrial fibrillation Brother        dx'ed age 53   Atrial fibrillation Brother        dx'ed age 71   Heart failure Brother    Diabetes Son  Pancreatitis Son        also ETOH abuse    Cancer Maternal Grandmother    Heart disease Maternal Grandfather    Social History   Socioeconomic History   Marital status: Married    Spouse name: [name]   Number of children: 2   Years of education: 16   Highest education level: Bachelor's degree (e.g., BA, AB, BS)  Occupational History   Occupation: Retired  Tobacco Use   Smoking status: Former    Current packs/day: 0.00    Types: Cigarettes    Start date: 03/14/1956    Quit date: 03/14/2002    Years since quitting: 20.7   Smokeless tobacco: Never   Tobacco comments:    Former smoker 01/23/2021  Vaping Use   Vaping status: Never Used  Substance and Sexual Activity   Alcohol use: Yes    Alcohol/week: 1.0 standard drink of alcohol    Types: 1 Glasses of wine per week   Drug use: No   Sexual activity: Not Currently    Partners: Female    Birth control/protection: None  Other Topics Concern   Not on file  Social History Narrative   Lives at [facility] with his wife. He is still driving. He enjoys walking and going to the gym.   Social Determinants of Health   Financial Resource Strain: Low Risk  (11/24/2022)   Overall Financial Resource Strain (CARDIA)    Difficulty of Paying Living  Expenses: Not hard at all  Food Insecurity: No Food Insecurity (11/24/2022)   Hunger Vital Sign    Worried About Running Out of Food in the Last Year: Never true    Ran Out of Food in the Last Year: Never true  Transportation Needs: No Transportation Needs (11/24/2022)   PRAPARE - Administrator, Civil Service (Medical): No    Lack of Transportation (Non-Medical): No  Physical Activity: Insufficiently Active (11/24/2022)   Exercise Vital Sign    Days of Exercise per Week: 2 days    Minutes of Exercise per Session: 20 min  Stress: No Stress Concern Present (11/24/2022)   [employer] of Occupational Health - Occupational Stress Questionnaire    Feeling of Stress : Only a little  Social Connections: Socially Integrated (11/25/2022)   Social Connection and Isolation Panel [NHANES]    Frequency of Communication with Friends and Family: More than three times a week    Frequency of Social Gatherings with Friends and Family: More than three times a week    Attends Religious Services: More than 4 times per year    Active Member of [employer] or Organizations: Yes    Attends Banker Meetings: More than 4 times per year    Marital Status: Married    Activities of Daily Living    11/24/2022   12:25 PM  In your present state of health, do you have any difficulty performing the following activities:  Hearing? 1  Vision? 1  Difficulty concentrating or making decisions? 1  Walking or climbing stairs? 1  Dressing or bathing? 0  Doing errands, shopping? 1  Preparing Food and eating ? N  Using the Toilet? N  In the past six months, have you accidently leaked urine? N  Do you have problems with loss of bowel control? N  Managing your Medications? N  Managing your Finances? N  Housekeeping or managing your Housekeeping? N    Patient Education/ Literacy How often do you need to have someone help  you when you read instructions, pamphlets, or other written materials from your  doctor or pharmacy?: 3 - Sometimes What is the last grade level you completed in school?: Bachelor's degree  Exercise    Diet Patient reports consuming 3 meals a day and 2 snack(s) a day Patient reports that his primary diet is: Regular Patient reports that she does have regular access to food.   Depression Screen    11/25/2022   11:07 AM 11/19/2021   11:12 AM 07/24/2021   11:12 AM 05/21/2021    1:35 PM 11/18/2020    1:16 PM 09/16/2020   10:08 AM 05/07/2020   11:01 AM  PHQ 2/9 Scores  PHQ - 2 Score 0 0 0 2 0 0 0     Fall Risk    11/25/2022   11:06 AM 11/24/2022   12:25 PM 09/09/2022   11:33 AM 11/19/2021   11:12 AM 07/24/2021   11:12 AM  Fall Risk   Falls in the past year? 1 1 0 1 1  Number falls in past yr: 0 0 0 1 0  Injury with Fall? 1 1 0 1 1  Risk for fall due to : History of fall(s)  No Fall Risks History of fall(s);Impaired balance/gait;Impaired mobility History of fall(s)  Follow up Falls evaluation completed;Education provided;Falls prevention discussed  Falls evaluation completed Falls evaluation completed;Education provided;Falls prevention discussed Falls evaluation completed     Objective:  Samuel Mcconnell seemed alert and oriented and he participated appropriately during our telephone visit.  Blood Pressure Weight BMI  BP Readings from Last 3 Encounters:  10/22/22 (!) 116/54  09/09/22 132/78  02/19/22 (!) 122/58   Wt Readings from Last 3 Encounters:  10/22/22 178 lb (80.7 kg)  09/09/22 179 lb (81.2 kg)  02/19/22 179 lb 9.6 oz (81.5 kg)   BMI Readings from Last 1 Encounters:  10/22/22 24.83 kg/m    *Unable to obtain current vital signs, weight, and BMI due to telephone visit type  Hearing/Vision  Samuel Mcconnell did not seem to have difficulty with hearing/understanding during the telephone conversation Reports that he has had a formal eye exam by an eye care professional within the past year Reports that he has had a formal hearing evaluation within the past  year *Unable to fully assess hearing and vision during telephone visit type  Cognitive Function:    11/25/2022   11:15 AM 11/19/2021   11:16 AM 09/16/2020   10:20 AM  6CIT Screen  What Year? 0 points 0 points 0 points  What month? 0 points 0 points 0 points  What time? 0 points 0 points 0 points  Count back from 20 0 points 0 points 0 points  Months in reverse 0 points 0 points 0 points  Repeat phrase 2 points 2 points 0 points  Total Score 2 points 2 points 0 points   (Normal:0-7, Significant for Dysfunction: >8)  Normal Cognitive Function Screening: Yes   Immunization & Health Maintenance Record Immunization History  Administered Date(s) Administered   Fluad Quad(high Dose 65+) 01/22/2020, 11/18/2020, 12/02/2021   Influenza Split 12/25/2010   Influenza, High Dose Seasonal PF 01/14/2015, 12/31/2015, 12/24/2016, 01/21/2018, 01/21/2018, 12/14/2018   Influenza, Seasonal, Injecte, Preservative Fre 01/02/2014   Influenza-Unspecified 12/25/2010, 12/26/2012, 01/02/2014   PFIZER(Purple Top)SARS-COV-2 Vaccination 05/06/2019, 05/29/2019, 11/29/2019, 07/11/2020   Pfizer Covid-19 Vaccine Bivalent Booster 17yrs & up 03/21/2021   Pneumococcal Conjugate-13 06/16/2013   Pneumococcal Polysaccharide-23 03/23/2006   Tdap 08/14/2014, 09/07/2019   Zoster Recombinant(Shingrix) 11/11/2017, 03/18/2018   Zoster,  Live 08/14/2014    Health Maintenance  Topic Date Due   COVID-19 Vaccine (6 - 2023-24 season) 12/11/2022 (Originally 11/22/2022)   INFLUENZA VACCINE  06/21/2023 (Originally 10/22/2022)   Medicare Annual Wellness (AWV)  11/25/2023   DTaP/Tdap/Td (3 - Td or Tdap) 09/06/2029   Pneumonia Vaccine 98+ Years old  Completed   Zoster Vaccines- Shingrix  Completed   HPV VACCINES  Aged Out       Assessment  This is a routine wellness examination for [employer].  Health Maintenance: Due or Overdue There are no preventive care reminders to display for this patient.   Samuel Mcconnell does not  need a referral for Community Assistance: Care Management:   no Social Work:    no Prescription Assistance:  no Nutrition/Diabetes Education:  no   Plan:  Personalized Goals  Goals Addressed               This Visit's Progress     Patient Stated (pt-stated)        Patient stated that he would like to maintain his exercise routine.       Personalized Health Maintenance & Screening Recommendations  Influenza vaccine  Lung Cancer Screening Recommended: no (Low Dose CT Chest recommended if Age 7-80 years, 20 pack-year currently smoking OR have quit w/in past 15 years) Hepatitis C Screening recommended: no HIV Screening recommended: no  Advanced Directives: Written information was not prepared per patient's request.  Referrals & Orders No orders of the defined types were placed in this encounter.   Follow-up Plan Follow-up with Everrett Coombe, DO as planned Schedule influenza vaccine at the pharmacy or in office visit. Medicare wellness visit in one year.  Patient will access AVS on my chart.   I have personally reviewed and noted the following in the patient's chart:   Medical and social history Use of alcohol, tobacco or illicit drugs  Current medications and supplements Functional ability and status Nutritional status Physical activity Advanced directives List of other physicians Hospitalizations, surgeries, and ER visits in previous 12 months Vitals Screenings to include cognitive, depression, and falls Referrals and appointments  In addition, I have reviewed and discussed with Samuel Mcconnell certain preventive protocols, quality metrics, and best practice recommendations. A written personalized care plan for preventive services as well as general preventive health recommendations is available and can be mailed to the patient at his request.      Modesto Charon, RN BSN  11/25/2022

## 2022-11-25 NOTE — Patient Instructions (Addendum)
MEDICARE ANNUAL WELLNESS VISIT Health Maintenance Summary and Written Plan of Care  [name] ,  Thank you for allowing me to perform your Medicare Annual Wellness Visit and for your ongoing commitment to your health.   Health Maintenance & Immunization History Health Maintenance  Topic Date Due   COVID-19 Vaccine (6 - 2023-24 season) 12/11/2022 (Originally 11/22/2022)   INFLUENZA VACCINE  06/21/2023 (Originally 10/22/2022)   Medicare Annual Wellness (AWV)  11/25/2023   DTaP/Tdap/Td (3 - Td or Tdap) 09/06/2029   Pneumonia Vaccine 34+ Years old  Completed   Zoster Vaccines- Shingrix  Completed   HPV VACCINES  Aged Out   Immunization History  Administered Date(s) Administered   Fluad Quad(high Dose 65+) 01/22/2020, 11/18/2020, 12/02/2021   Influenza Split 12/25/2010   Influenza, High Dose Seasonal PF 01/14/2015, 12/31/2015, 12/24/2016, 01/21/2018, 01/21/2018, 12/14/2018   Influenza, Seasonal, Injecte, Preservative Fre 01/02/2014   Influenza-Unspecified 12/25/2010, 12/26/2012, 01/02/2014   PFIZER(Purple Top)SARS-COV-2 Vaccination 05/06/2019, 05/29/2019, 11/29/2019, 07/11/2020   Pfizer Covid-19 Vaccine Bivalent Booster 59yrs & up 03/21/2021   Pneumococcal Conjugate-13 06/16/2013   Pneumococcal Polysaccharide-23 03/23/2006   Tdap 08/14/2014, 09/07/2019   Zoster Recombinant(Shingrix) 11/11/2017, 03/18/2018   Zoster, Live 08/14/2014    These are the patient goals that we discussed:  Goals Addressed               This Visit's Progress     Patient Stated (pt-stated)        Patient stated that he would like to maintain his exercise routine.         This is a list of Health Maintenance Items that are overdue or due now: Influenza vaccine  Orders/Referrals Placed Today: No orders of the defined types were placed in this encounter.  (Contact our referral department at [phone] if you have not spoken with someone about your referral appointment within the next 5 days)     Follow-up Plan Follow-up with Everrett Coombe, DO as planned Schedule influenza vaccine at the pharmacy or in office visit. Medicare wellness visit in one year.  Patient will access AVS on my chart.      Health Maintenance, Male Adopting a healthy lifestyle and getting preventive care are important in promoting health and wellness. Ask your health care provider about: The right schedule for you to have regular tests and exams. Things you can do on your own to prevent diseases and keep yourself healthy. What should I know about diet, weight, and exercise? Eat a healthy diet  Eat a diet that includes plenty of vegetables, fruits, low-fat dairy products, and lean protein. Do not eat a lot of foods that are high in solid fats, added sugars, or sodium. Maintain a healthy weight Body mass index (BMI) is a measurement that can be used to identify possible weight problems. It estimates body fat based on height and weight. Your health care provider can help determine your BMI and help you achieve or maintain a healthy weight. Get regular exercise Get regular exercise. This is one of the most important things you can do for your health. Most adults should: Exercise for at least 150 minutes each week. The exercise should increase your heart rate and make you sweat (moderate-intensity exercise). Do strengthening exercises at least twice a week. This is in addition to the moderate-intensity exercise. Spend less time sitting. Even light physical activity can be beneficial. Watch cholesterol and blood lipids Have your blood tested for lipids and cholesterol at 87 years of age, then have this test every  5 years. You may need to have your cholesterol levels checked more often if: Your lipid or cholesterol levels are high. You are older than 87 years of age. You are at high risk for heart disease. What should I know about cancer screening? Many types of cancers can be detected early and may often  be prevented. Depending on your health history and family history, you may need to have cancer screening at various ages. This may include screening for: Colorectal cancer. Prostate cancer. Skin cancer. Lung cancer. What should I know about heart disease, diabetes, and high blood pressure? Blood pressure and heart disease High blood pressure causes heart disease and increases the risk of stroke. This is more likely to develop in people who have high blood pressure readings or are overweight. Talk with your health care provider about your target blood pressure readings. Have your blood pressure checked: Every 3-5 years if you are 32-22 years of age. Every year if you are 77 years old or older. If you are between the ages of 52 and 11 and are a current or former smoker, ask your health care provider if you should have a one-time screening for abdominal aortic aneurysm (AAA). Diabetes Have regular diabetes screenings. This checks your fasting blood sugar level. Have the screening done: Once every three years after age 75 if you are at a normal weight and have a low risk for diabetes. More often and at a younger age if you are overweight or have a high risk for diabetes. What should I know about preventing infection? Hepatitis B If you have a higher risk for hepatitis B, you should be screened for this virus. Talk with your health care provider to find out if you are at risk for hepatitis B infection. Hepatitis C Blood testing is recommended for: Everyone born from [birth year] through 1965. Anyone with known risk factors for hepatitis C. Sexually transmitted infections (STIs) You should be screened each year for STIs, including gonorrhea and chlamydia, if: You are sexually active and are younger than 87 years of age. You are older than 87 years of age and your health care provider tells you that you are at risk for this type of infection. Your sexual activity has changed since you were last  screened, and you are at increased risk for chlamydia or gonorrhea. Ask your health care provider if you are at risk. Ask your health care provider about whether you are at high risk for HIV. Your health care provider may recommend a prescription medicine to help prevent HIV infection. If you choose to take medicine to prevent HIV, you should first get tested for HIV. You should then be tested every 3 months for as long as you are taking the medicine. Follow these instructions at home: Alcohol use Do not drink alcohol if your health care provider tells you not to drink. If you drink alcohol: Limit how much you have to 0-2 drinks a day. Know how much alcohol is in your drink. In the [place] one drink equals one 12 oz bottle of beer (355 mL), one 5 oz glass of wine (148 mL), or one 1 oz glass of hard liquor (44 mL). Lifestyle Do not use any products that contain nicotine or tobacco. These products include cigarettes, chewing tobacco, and vaping devices, such as e-cigarettes. If you need help quitting, ask your health care provider. Do not use street drugs. Do not share needles. Ask your health care provider for help if you need support or  information about quitting drugs. General instructions Schedule regular health, dental, and eye exams. Stay current with your vaccines. Tell your health care provider if: You often feel depressed. You have ever been abused or do not feel safe at home. Summary Adopting a healthy lifestyle and getting preventive care are important in promoting health and wellness. Follow your health care provider's instructions about healthy diet, exercising, and getting tested or screened for diseases. Follow your health care provider's instructions on monitoring your cholesterol and blood pressure. This information is not intended to replace advice given to you by your health care provider. Make sure you discuss any questions you have with your health care provider. Document  Revised: 07/29/2020 Document Reviewed: 07/29/2020 Elsevier Patient Education  2024 [employer].

## 2022-12-04 DIAGNOSIS — L84 Corns and callosities: Secondary | ICD-10-CM | POA: Diagnosis not present

## 2022-12-04 DIAGNOSIS — B351 Tinea unguium: Secondary | ICD-10-CM | POA: Diagnosis not present

## 2022-12-04 DIAGNOSIS — M79675 Pain in left toe(s): Secondary | ICD-10-CM | POA: Diagnosis not present

## 2022-12-21 ENCOUNTER — Telehealth: Payer: Self-pay

## 2022-12-21 DIAGNOSIS — Z23 Encounter for immunization: Secondary | ICD-10-CM

## 2022-12-21 NOTE — Telephone Encounter (Signed)
Flu shot

## 2022-12-24 ENCOUNTER — Other Ambulatory Visit: Payer: Self-pay | Admitting: Family Medicine

## 2022-12-24 DIAGNOSIS — I517 Cardiomegaly: Secondary | ICD-10-CM

## 2023-01-28 ENCOUNTER — Telehealth: Payer: Self-pay | Admitting: Cardiovascular Disease

## 2023-01-28 NOTE — Telephone Encounter (Signed)
STAT if HR is under 50 or over 120 (normal HR is 60-100 beats per minute)  What is your heart rate? 105, 118, 107, 98, 101, 105  Do you have a log of your heart rate readings (document readings)? Yes   Do you have any other symptoms? Lightheadedness, fatigue

## 2023-01-28 NOTE — Telephone Encounter (Signed)
Spoke with daughter per DPR and she states patient has been in out of AFIB today.   HR-95 BP-120/76 at 2:46pm  Denies chest pain, SOB, swelling, headache, nausea or vomiting. She did state sometimes patient gets dizzy when getting up.   Advised to continue taking medications as prescribed, monitor heart rate, change positions slowly and staying hydrated. ED precautions discussed. Will forward to provider.

## 2023-01-29 NOTE — Telephone Encounter (Signed)
Spoke with [name]. Samuel Mcconnell is back in normal rhythm today, with heart rate in the 60s. Continue same meds.

## 2023-02-03 ENCOUNTER — Encounter: Payer: Self-pay | Admitting: Cardiovascular Disease

## 2023-02-03 DIAGNOSIS — I480 Paroxysmal atrial fibrillation: Secondary | ICD-10-CM

## 2023-02-04 NOTE — Telephone Encounter (Signed)
Please increase the amiodarone back to 200 mg a day every day of the week.Marland Kitchen  He was only taking it 5 days a week. Please have him wear a 14-day monitor for atrial fibrillation. He is also due for office follow-up.  Please schedule first available appointment, which should be after she finishes wearing the monitor.Marland Kitchen

## 2023-02-05 ENCOUNTER — Ambulatory Visit: Payer: Medicare HMO | Attending: Cardiovascular Disease

## 2023-02-05 DIAGNOSIS — I480 Paroxysmal atrial fibrillation: Secondary | ICD-10-CM

## 2023-02-05 NOTE — Progress Notes (Unsigned)
Enrolled patient for a 14 day Zio XT  monitor to be mailed to patients home  °

## 2023-02-08 ENCOUNTER — Ambulatory Visit: Payer: Medicare HMO

## 2023-02-08 ENCOUNTER — Encounter: Payer: Self-pay | Admitting: Family Medicine

## 2023-02-08 ENCOUNTER — Ambulatory Visit (INDEPENDENT_AMBULATORY_CARE_PROVIDER_SITE_OTHER): Payer: Medicare HMO | Admitting: Family Medicine

## 2023-02-08 VITALS — BP 129/66 | HR 74 | Ht 71.0 in | Wt 170.0 lb

## 2023-02-08 DIAGNOSIS — R059 Cough, unspecified: Secondary | ICD-10-CM | POA: Diagnosis not present

## 2023-02-08 DIAGNOSIS — J441 Chronic obstructive pulmonary disease with (acute) exacerbation: Secondary | ICD-10-CM | POA: Diagnosis not present

## 2023-02-08 DIAGNOSIS — R0602 Shortness of breath: Secondary | ICD-10-CM

## 2023-02-08 DIAGNOSIS — I700 Atherosclerosis of aorta: Secondary | ICD-10-CM | POA: Diagnosis not present

## 2023-02-08 MED ORDER — CEFDINIR 300 MG PO CAPS: 300.0000 mg | ORAL_CAPSULE | Freq: Two times a day (BID) | ORAL | 0 refills | Status: DC

## 2023-02-08 MED ORDER — PREDNISONE 20 MG PO TABS: 20.0000 mg | ORAL_TABLET | Freq: Two times a day (BID) | ORAL | 0 refills | Status: AC

## 2023-02-08 MED ORDER — AZITHROMYCIN 250 MG PO TABS: ORAL_TABLET | ORAL | 0 refills | Status: AC

## 2023-02-08 MED ORDER — BREZTRI AEROSPHERE 160-9-4.8 MCG/ACT IN AERO: 2.0000 | INHALATION_SPRAY | Freq: Two times a day (BID) | RESPIRATORY_TRACT | 1 refills | Status: DC

## 2023-02-08 NOTE — Progress Notes (Signed)
Samuel Mcconnell - 89 y.o. male MRN [id number]  Date of birth: 04/15/1927  Subjective Chief Complaint  Patient presents with   URI    HPI Samuel Mcconnell is a 89 y.o. male here today with complaint of sinus and chest congestion.  Symptoms started about 4 to 5 days ago.  He does have history of COPD and he has had a couple episodes of pneumonia in the past year.  He does feel shortness of breath.  He has not had fever or chills. He has been in and out of A. Fib recently.  His cardiologist is aware.  Denies chest pain or anginal symptoms.   ROS:  A comprehensive ROS was completed and negative except as noted per HPI  Allergies  Allergen Reactions   Clindamycin/Lincomycin Other (See Comments)    Patient "felt weird"    Dabigatran Etexilate Mesylate Other (See Comments)    "felt weird" (name brand Pradaxa)   Doxycycline Other (See Comments)    Dyspepsia    Past Medical History:  Diagnosis Date   Arthritis    Atrial fibrillation (HCC)    radiofrequency ablation   Cataract    CHF (congestive heart failure) (HCC)    Clotting disorder (HCC)    due to medication   Emphysema    Emphysema of lung (HCC)    Hypertension    Thyroid disease    Ulcer    GI years prior    Past Surgical History:  Procedure Laterality Date   CARDIAC SURGERY     CARDIOVERSION N/A 10/24/2019   Procedure: CARDIOVERSION;  Surgeon: Thurmon Fair, MD;  Location: [facility] ENDOSCOPY;  Service: Cardiovascular;  Laterality: N/A;   CARDIOVERSION N/A 10/22/2020   Procedure: CARDIOVERSION;  Surgeon: Vesta Mixer, MD;  Location: [facility] ENDOSCOPY;  Service: Cardiovascular;  Laterality: N/A;   COLON SURGERY  03/23/1997   EYE SURGERY     KNEE ARTHROSCOPY Left 07/21/2000   LOOP RECORDER IMPLANT  01/16/2010   Medtronic Reveal XT (Dr. Claudia Desanctis)    RADIOFREQUENCY ABLATION  06/13/2009   SHOULDER ARTHROSCOPY WITH ROTATOR CUFF REPAIR Left 04/23/1996   TIBIAL TUBERCLERPLASTY  ~ 10/2010   TRANSTHORACIC ECHOCARDIOGRAM  11/19/2011    EF=>55%; mild MR, mild mitral annular calcif; mild TR, normal RSVP; AV mildly sclerotic, mild AV regurg; trace pulm valve regurg     Social History   Socioeconomic History   Marital status: Married    Spouse name: [name]   Number of children: 2   Years of education: 16   Highest education level: Bachelor's degree (e.g., BA, AB, BS)  Occupational History   Occupation: Retired  Tobacco Use   Smoking status: Former    Current packs/day: 0.00    Types: Cigarettes    Start date: 03/14/1956    Quit date: 03/14/2002    Years since quitting: 20.9   Smokeless tobacco: Never   Tobacco comments:    Former smoker 01/23/2021  Vaping Use   Vaping status: Never Used  Substance and Sexual Activity   Alcohol use: Yes    Alcohol/week: 1.0 standard drink of alcohol    Types: 1 Glasses of wine per week   Drug use: No   Sexual activity: Not Currently    Partners: Female    Birth control/protection: None  Other Topics Concern   Not on file  Social History Narrative   Lives at [employer] with his wife. He is still driving. He enjoys walking and going to the gym.   Social Determinants of  Health   Financial Resource Strain: Low Risk  (11/24/2022)   Overall Financial Resource Strain (CARDIA)    Difficulty of Paying Living Expenses: Not hard at all  Food Insecurity: No Food Insecurity (11/24/2022)   Hunger Vital Sign    Worried About Running Out of Food in the Last Year: Never true    Ran Out of Food in the Last Year: Never true  Transportation Needs: No Transportation Needs (11/24/2022)   PRAPARE - Administrator, Civil Service (Medical): No    Lack of Transportation (Non-Medical): No  Physical Activity: Insufficiently Active (11/24/2022)   Exercise Vital Sign    Days of Exercise per Week: 2 days    Minutes of Exercise per Session: 20 min  Stress: No Stress Concern Present (11/24/2022)   [employer] of Occupational Health - Occupational Stress Questionnaire    Feeling  of Stress : Only a little  Social Connections: Socially Integrated (11/25/2022)   Social Connection and Isolation Panel [NHANES]    Frequency of Communication with Friends and Family: More than three times a week    Frequency of Social Gatherings with Friends and Family: More than three times a week    Attends Religious Services: More than 4 times per year    Active Member of Clubs or Organizations: Yes    Attends Engineer, structural: More than 4 times per year    Marital Status: Married    Family History  Problem Relation Age of Onset   Heart failure Mother    Heart failure Father        MI - died @ 71   Atrial fibrillation Sister    Hypertension Sister    Heart Problems Sister    Diabetes Sister    Heart failure Sister    Atrial fibrillation Brother        dx'ed age 8   Atrial fibrillation Brother        dx'ed age 3   Heart failure Brother    Diabetes Son    Pancreatitis Son        also ETOH abuse    Cancer Maternal Grandmother    Heart disease Maternal Grandfather     Health Maintenance  Topic Date Due   COVID-19 Vaccine (7 - 2023-24 season) 02/24/2023 (Originally 02/05/2023)   Medicare Annual Wellness (AWV)  11/25/2023   DTaP/Tdap/Td (3 - Td or Tdap) 09/06/2029   Pneumonia Vaccine 70+ Years old  Completed   INFLUENZA VACCINE  Completed   Zoster Vaccines- Shingrix  Completed   HPV VACCINES  Aged Out     ----------------------------------------------------------------------------------------------------------------------------------------------------------------------------------------------------------------- Physical Exam BP 129/66 (BP Location: Left Arm, Patient Position: Sitting, Cuff Size: Normal)   Pulse 74   Ht 5\' 11"  (1.803 m)   Wt 170 lb (77.1 kg)   SpO2 (!) 89%   BMI 23.71 kg/m   Physical Exam Constitutional:      Appearance: Normal appearance.  HENT:     Head: Normocephalic and atraumatic.  Eyes:     General: No scleral  icterus. Cardiovascular:     Rate and Rhythm: Normal rate and regular rhythm.  Pulmonary:     Effort: Pulmonary effort is normal.     Breath sounds: Wheezing present.  Musculoskeletal:     Cervical back: Neck supple.  Neurological:     Mental Status: He is alert.  Psychiatric:        Mood and Affect: Mood normal.        Behavior: Behavior  normal.     ------------------------------------------------------------------------------------------------------------------------------------------------------------------------------------------------------------------- Assessment and Plan  COPD exacerbation (HCC) COPD exacerbation with possibility of pneumonia as well.  Stat chest x-ray ordered.  Will start combination of Omnicef and azithromycin.  5-day prednisone burst.  Continue albuterol as needed.  Changing his Trelegy to [facility] as he has difficulty with dry powder inhaler.   Meds ordered this encounter  Medications   Budeson-Glycopyrrol-Formoterol (BREZTRI AEROSPHERE) 160-9-4.8 MCG/ACT AERO    Sig: Inhale 2 puffs into the lungs 2 (two) times daily.    Dispense:  3 each    Refill:  1   cefdinir (OMNICEF) 300 MG capsule    Sig: Take 1 capsule (300 mg total) by mouth 2 (two) times daily.    Dispense:  14 capsule    Refill:  0   azithromycin (ZITHROMAX) 250 MG tablet    Sig: Take 2 tablets on day 1, then 1 tablet daily on days 2 through 5    Dispense:  6 tablet    Refill:  0   predniSONE (DELTASONE) 20 MG tablet    Sig: Take 1 tablet (20 mg total) by mouth 2 (two) times daily with a meal for 5 days.    Dispense:  10 tablet    Refill:  0    No follow-ups on file.    This visit occurred during the SARS-CoV-2 public health emergency.  Safety protocols were in place, including screening questions prior to the visit, additional usage of staff PPE, and extensive cleaning of exam room while observing appropriate contact time as indicated for disinfecting solutions.

## 2023-02-08 NOTE — Assessment & Plan Note (Signed)
COPD exacerbation with possibility of pneumonia as well.  Stat chest x-ray ordered.  Will start combination of Omnicef and azithromycin.  5-day prednisone burst.  Continue albuterol as needed.  Changing his Trelegy to [facility] as he has difficulty with dry powder inhaler.

## 2023-02-08 NOTE — Patient Instructions (Signed)
Start combination of omnicef and azithromycin.  Start prednisone.  Try breztri to replace Trelegy.  We'll be in touch with xray results.

## 2023-02-11 ENCOUNTER — Encounter: Payer: Self-pay | Admitting: Family Medicine

## 2023-03-05 DIAGNOSIS — I480 Paroxysmal atrial fibrillation: Secondary | ICD-10-CM | POA: Diagnosis not present

## 2023-03-09 ENCOUNTER — Telehealth: Payer: Self-pay | Admitting: Emergency Medicine

## 2023-03-09 DIAGNOSIS — I480 Paroxysmal atrial fibrillation: Secondary | ICD-10-CM

## 2023-03-09 DIAGNOSIS — Z5181 Encounter for therapeutic drug level monitoring: Secondary | ICD-10-CM

## 2023-03-09 MED ORDER — AMIODARONE HCL 200 MG PO TABS: ORAL_TABLET | ORAL | Status: DC

## 2023-03-09 NOTE — Telephone Encounter (Signed)
Croitoru, Mihai, MD  Scheryl Marten, RN Cc: Everrett Coombe, DO Although he did not have any true atrial fibrillation throughout the recording, there were incessant episodes of very brief atrial tachycardia.  Most of these consisted of only a few consecutive beats, but some were little longer lasting up to 3 minutes in duration.  Although the episodes are brief, they occurred thousands of times throughout the recording, dozens of time every hour. Increasing the amiodarone to 200 mg every day is the right intervention, but may not be enough.  This is a slow medicine, changes in daily dosage take a long time to really kick in, so I would recommend taking a higher amount, 400 mg daily for about 14 days and then going back down to 200 mg once daily after 10 days.  Katie, please increase amiodarone to 400 mg daily for 14 days (between now and New Year's Day) then decrease the dose back to 200 mg once daily. I would also like to recheck his thyroid function and electrolytes (TSH, free T4,  Cmet) and would be great to get this done before he comes for his appointment on 03/19/2023.  If he cannot, will just get the blood drawn on that day.  Went over the information above with Lyla Son (dpr). She verbalized understanding. Reports that they will try to come either on 12/23 or 12/26 to get lab work before the appt on 12/27. She will let us know if they need a refill on Amiodarone.  Updated med list (did not sent a refill= No print)

## 2023-03-15 ENCOUNTER — Other Ambulatory Visit: Payer: Self-pay

## 2023-03-15 DIAGNOSIS — I480 Paroxysmal atrial fibrillation: Secondary | ICD-10-CM | POA: Diagnosis not present

## 2023-03-15 DIAGNOSIS — Z5181 Encounter for therapeutic drug level monitoring: Secondary | ICD-10-CM | POA: Diagnosis not present

## 2023-03-15 DIAGNOSIS — Z79899 Other long term (current) drug therapy: Secondary | ICD-10-CM

## 2023-03-15 LAB — COMPREHENSIVE METABOLIC PANEL
ALT: 17 [IU]/L (ref 0–44)
AST: 26 [IU]/L (ref 0–40)
Albumin: 3.9 g/dL (ref 3.6–4.6)
Alkaline Phosphatase: 73 [IU]/L (ref 44–121)
BUN/Creatinine Ratio: 17 (ref 10–24)
BUN: 28 mg/dL (ref 10–36)
Bilirubin Total: 0.3 mg/dL (ref 0.0–1.2)
CO2: 26 mmol/L (ref 20–29)
Calcium: 9.6 mg/dL (ref 8.6–10.2)
Chloride: 102 mmol/L (ref 96–106)
Creatinine, Ser: 1.64 mg/dL — ABNORMAL HIGH (ref 0.76–1.27)
Globulin, Total: 1.9 g/dL (ref 1.5–4.5)
Glucose: 109 mg/dL — ABNORMAL HIGH (ref 70–99)
Potassium: 5.3 mmol/L — ABNORMAL HIGH (ref 3.5–5.2)
Sodium: 141 mmol/L (ref 134–144)
Total Protein: 5.8 g/dL — ABNORMAL LOW (ref 6.0–8.5)
eGFR: 39 mL/min/{1.73_m2} — ABNORMAL LOW (ref >59)

## 2023-03-15 LAB — TSH+FREE T4
Free T4: 1.59 ng/dL (ref 0.82–1.77)
TSH: 4.13 u[IU]/mL (ref 0.450–4.500)

## 2023-03-19 ENCOUNTER — Encounter: Payer: Self-pay | Admitting: Cardiovascular Disease

## 2023-03-19 ENCOUNTER — Ambulatory Visit: Payer: Medicare HMO | Attending: Cardiovascular Disease | Admitting: Cardiovascular Disease

## 2023-03-19 VITALS — BP 112/62 | HR 57 | Ht 71.0 in | Wt 185.6 lb

## 2023-03-19 DIAGNOSIS — I480 Paroxysmal atrial fibrillation: Secondary | ICD-10-CM

## 2023-03-19 DIAGNOSIS — I495 Sick sinus syndrome: Secondary | ICD-10-CM | POA: Diagnosis not present

## 2023-03-19 DIAGNOSIS — I5032 Chronic diastolic (congestive) heart failure: Secondary | ICD-10-CM | POA: Diagnosis not present

## 2023-03-19 DIAGNOSIS — Z5181 Encounter for therapeutic drug level monitoring: Secondary | ICD-10-CM

## 2023-03-19 DIAGNOSIS — Z79899 Other long term (current) drug therapy: Secondary | ICD-10-CM

## 2023-03-19 DIAGNOSIS — N1832 Chronic kidney disease, stage 3b: Secondary | ICD-10-CM

## 2023-03-19 MED ORDER — AMIODARONE HCL 200 MG PO TABS: 200.0000 mg | ORAL_TABLET | Freq: Every day | ORAL | 3 refills | Status: AC

## 2023-03-19 NOTE — Patient Instructions (Addendum)
Medication Instructions:  AMIODARONE 200 MG DAILY *If you need a refill on your cardiac medications before your next appointment, please call your pharmacy*  Procedures: Your physician has recommended that you wear a 3 DAY ZIO-PATCH monitor (in March 2025). The Zio patch cardiac monitor continuously records heart rhythm data for up to 14 days, this is for patients being evaluated for multiple types heart rhythms. For the first 24 hours post application, please avoid getting the Zio monitor wet in the shower or by excessive sweating during exercise. After that, feel free to carry on with regular activities. Keep soaps and lotions away from the ZIO XT Patch.  This will be mailed to you, please expect 7-10 days to receive.    Applying the monitor   Shave hair from upper left chest.   Hold abrader disc by orange tab.  Rub abrader in 40 strokes over left upper chest as indicated in your monitor instructions.   Clean area with 4 enclosed alcohol pads .  Use all pads to assure are is cleaned thoroughly.  Let dry.   Apply patch as indicated in monitor instructions.  Patch will be place under collarbone on left side of chest with arrow pointing upward.   Rub patch adhesive wings for 2 minutes.Remove white label marked "1".  Remove white label marked "2".  Rub patch adhesive wings for 2 additional minutes.   While looking in a mirror, press and release button in center of patch.  A small green light will flash 3-4 times .  This will be your only indicator the monitor has been turned on.     Do not shower for the first 24 hours.  You may shower after the first 24 hours.   Press button if you feel a symptom. You will hear a small click.  Record Date, Time and Symptom in the Patient Log Book.   When you are ready to remove patch, follow instructions on last 2 pages of Patient Log Book.  Stick patch monitor onto last page of Patient Log Book.   Place Patient Log Book in [place] box.  Use locking tab on  box and tape box closed securely.  The [employer] and [employer] has [employer] on it.  Please place in mailbox as soon as possible.  Your physician should have your test results approximately 7 days after the monitor has been mailed back to [facility].   Call [facility] Customer Care at [phone] if you have questions regarding your ZIO XT patch monitor.  Call them immediately if you see an orange light blinking on your monitor.   If your monitor falls off in less than 4 days contact our Monitor department at [phone].  If your monitor becomes loose or falls off after 4 days call [employer] at [phone] for suggestions on securing your monitor   Follow-Up: At [facility], you and your health needs are our priority.  As part of our continuing mission to provide you with exceptional heart care, we have created designated Provider Care Teams.  These Care Teams include your primary Cardiologist (physician) and Advanced Practice Providers (APPs -  Physician Assistants and Nurse Practitioners) who all work together to provide you with the care you need, when you need it.  We recommend signing up for the patient portal called "MyChart".  Sign up information is provided on this After Visit Summary.  MyChart is used to connect with patients for Virtual Visits (Telemedicine).  Patients are able to view lab/test  results, encounter notes, upcoming appointments, etc.  Non-urgent messages can be sent to your provider as well.   To learn more about what you can do with MyChart, go to [email or web address].    Your next appointment:   6 month(s)  Provider:   Thurmon Fair, MD

## 2023-03-19 NOTE — Progress Notes (Signed)
Cardiology Office Note    Date:  03/19/2023   ID:  Samuel Mcconnell, DOB [birth year], MRN [id number]  PCP:  Everrett Coombe, DO  Cardiologist:   Thurmon Fair, MD   No chief complaint on file.    History of Present Illness:  Samuel Mcconnell is a 89 y.o. male with history of radiofrequency ablation for atrial fibrillation roughly 10 years ago, recently recurrent as persistent atrial fibrillation for which he underwent elective cardioversion successfully in August 2022.  He has a tendency to have sinus bradycardia as well as first-degree AV block and right bundle branch block.  His wife Samuel Mcconnell is also my patient.  She has developed worsening dementia.  He is causing increased emotional stress for him.  Samuel Mcconnell is here with his daughter.  About a month ago his daughter alerted [place] that Samuel Mcconnell was having lethargy and occasional lightheadedness.  Intermittently, his heart rate would be markedly elevated when she checked his blood pressure.  He has not been aware of any palpitations, but his family has noticed decreased stamina, more breathlessness and most recently some worsening ankle edema.  We had him wear an event monitor which showed an extremely high burden of brief episodes of atrial tachycardia.  More than 8000 episodes were recorded in just 2 weeks, but the longest 1 was under 3 minutes in duration.  No convincing evidence of atrial fibrillation was seen.  He had been taking the amiodarone 200 mg 5 days a week.  Subsequently asked him to increase his amiodarone temporarily to 400 mg daily.  There has been some improvement in his lightheadedness.  Today in the office he does not have much in the way of ectopy (only recorded 1 PAC on his twelve-lead electrocardiogram and only heard 1 ectopic beat during physical exam).  Previously his weight was typically in the 177-180 pound range.  He is now up to 185 pounds and has some ankle swelling.  He does not have orthopnea or PND.  Since he has had  recurrent problems with pneumonia (most recently admitted in August 2023) there is some suspicion he may be having aspiration.  He has had an evaluation by speech pathologist and has undergone some therapy for this.  On March 07, 2023 Samuel Mcconnell and his wife Samuel Mcconnell abraded their 73nd wedding anniversary.  On September 07 2019, he fell down the basement stairs while carrying heavy equipment.  On arrival to the hospital [facility]) he was found to be in atrial fibrillation.  Looking back his family wonders whether the arrhythmia may have started a month or 2 before, when he started complaining of some fatigue.  He was hospitalized and rate control was achieved on a combination of metoprolol and diltiazem.  Eliquis was started.  He had an echocardiogram that showed normal left ventricular systolic function with mild to moderate tricuspid regurgitation and moderate pulmonary hypertension.    His last echocardiogram in in the [facility] system was performed in 2018 and showed normal left ventricular systolic function and no clear evidence of diastolic dysfunction (his tissue Doppler velocities were actually probably normal for almost 89 years old).  The left atrium was "moderately dilated" (systolic diameter was only 36 mm, but indexed volume was 28.5 which I believe represents very mild dilation).  There is no serious valvular problem.  He underwent a event monitor as if there were 2018 that showed PACs and PVCs but no atrial fibrillation.  He has hypertension, COPD, treated hypothyroidism and acid reflux. He has a long-standing  right bundle branch block.  Past Medical History:  Diagnosis Date   Arthritis    Atrial fibrillation (HCC)    radiofrequency ablation   Cataract    CHF (congestive heart failure) (HCC)    Clotting disorder (HCC)    due to medication   Emphysema    Emphysema of lung (HCC)    Hypertension    Thyroid disease    Ulcer    GI years prior    Past Surgical History:  Procedure Laterality  Date   CARDIAC SURGERY     CARDIOVERSION N/A 10/24/2019   Procedure: CARDIOVERSION;  Surgeon: Thurmon Fair, MD;  Location: [facility] ENDOSCOPY;  Service: Cardiovascular;  Laterality: N/A;   CARDIOVERSION N/A 10/22/2020   Procedure: CARDIOVERSION;  Surgeon: Vesta Mixer, MD;  Location: [facility] ENDOSCOPY;  Service: Cardiovascular;  Laterality: N/A;   COLON SURGERY  03/23/1997   EYE SURGERY     KNEE ARTHROSCOPY Left 07/21/2000   LOOP RECORDER IMPLANT  01/16/2010   Medtronic Reveal XT (Dr. Claudia Desanctis)    RADIOFREQUENCY ABLATION  06/13/2009   SHOULDER ARTHROSCOPY WITH ROTATOR CUFF REPAIR Left 04/23/1996   TIBIAL TUBERCLERPLASTY  ~ 10/2010   TRANSTHORACIC ECHOCARDIOGRAM  11/19/2011   EF=>55%; mild MR, mild mitral annular calcif; mild TR, normal RSVP; AV mildly sclerotic, mild AV regurg; trace pulm valve regurg     Current Medications: Outpatient Medications Prior to Visit  Medication Sig Dispense Refill   acetaminophen (TYLENOL) 500 MG tablet Take 500 mg by mouth daily as needed for moderate pain.     AMBULATORY NON FORMULARY MEDICATION Please provide toilet riser/elevator with arms.  Diagnosis: Generalized weakness R53.1 1 Units 0   apixaban (ELIQUIS) 2.5 MG TABS tablet TAKE 1 TABLET TWICE DAILY (DOSE CHANGE) 180 tablet 1   Budeson-Glycopyrrol-Formoterol (BREZTRI AEROSPHERE) 160-9-4.8 MCG/ACT AERO Inhale 2 puffs into the lungs 2 (two) times daily. 3 each 1   Cholecalciferol (VITAMIN D3) 125 MCG (5000 UT) TABS Take 5,000 Units by mouth daily with lunch.     diclofenac Sodium (VOLTAREN) 1 % GEL Apply 2 g topically 4 (four) times daily as needed (pain).     diphenhydramine-acetaminophen (TYLENOL PM) 25-500 MG TABS tablet Take 1 tablet by mouth at bedtime as needed (for sleeplessness).     fluticasone (FLONASE) 50 MCG/ACT nasal spray Place 2 sprays into both nostrils daily. 16 g 6   furosemide (LASIX) 20 MG tablet TAKE 1 TABLET EVERY DAY AS NEEDED FOR EDEMA 90 tablet 3   Ketotifen Fumarate (ITCHY EYE  DROPS OP) Place 1 drop into both eyes daily as needed (itchy eyes).     levothyroxine (SYNTHROID) 137 MCG tablet TAKE 1 TABLET EVERY DAY BEFORE BREAKFAST (NEED LABS AND MD APPOINTMENT) 90 tablet 3   Multiple Vitamin (MULTIVITAMIN) capsule Take 1 capsule by mouth daily.     rOPINIRole (REQUIP) 0.25 MG tablet Take 3 tablets (0.75 mg total) by mouth at bedtime. 270 tablet 1   triamcinolone ointment (KENALOG) 0.1 % Apply 1 Application topically 2 (two) times daily as needed (irritation).     zinc gluconate 50 MG tablet Take 50 mg by mouth daily with lunch.     amiodarone (PACERONE) 200 MG tablet Take 400 mg daily for 14 days, then decrease to 200 mg daily     albuterol (VENTOLIN HFA) 108 (90 Base) MCG/ACT inhaler Inhale 2 puffs into the lungs every 6 (six) hours as needed for wheezing or shortness of breath. (Patient not taking: Reported on 03/19/2023)     cefdinir (  OMNICEF) 300 MG capsule Take 1 capsule (300 mg total) by mouth 2 (two) times daily. 14 capsule 0   No facility-administered medications prior to visit.     Allergies:   Clindamycin/lincomycin, Dabigatran etexilate mesylate, and Doxycycline   Social History   Socioeconomic History   Marital status: Married    Spouse name: [name]   Number of children: 2   Years of education: 16   Highest education level: Bachelor's degree (e.g., BA, AB, BS)  Occupational History   Occupation: Retired  Tobacco Use   Smoking status: Former    Current packs/day: 0.00    Types: Cigarettes    Start date: 03/14/1956    Quit date: 03/14/2002    Years since quitting: 21.0   Smokeless tobacco: Never   Tobacco comments:    Former smoker 01/23/2021  Vaping Use   Vaping status: Never Used  Substance and Sexual Activity   Alcohol use: Yes    Alcohol/week: 1.0 standard drink of alcohol    Types: 1 Glasses of wine per week   Drug use: No   Sexual activity: Not Currently    Partners: Female    Birth control/protection: None  Other Topics Concern    Not on file  Social History Narrative   Lives at [employer] with his wife. He is still driving. He enjoys walking and going to the gym.   Social Drivers of Corporate investment banker Strain: Low Risk  (11/24/2022)   Overall Financial Resource Strain (CARDIA)    Difficulty of Paying Living Expenses: Not hard at all  Food Insecurity: No Food Insecurity (11/24/2022)   Hunger Vital Sign    Worried About Running Out of Food in the Last Year: Never true    Ran Out of Food in the Last Year: Never true  Transportation Needs: No Transportation Needs (11/24/2022)   PRAPARE - Administrator, Civil Service (Medical): No    Lack of Transportation (Non-Medical): No  Physical Activity: Insufficiently Active (11/24/2022)   Exercise Vital Sign    Days of Exercise per Week: 2 days    Minutes of Exercise per Session: 20 min  Stress: No Stress Concern Present (11/24/2022)   [employer] of Occupational Health - Occupational Stress Questionnaire    Feeling of Stress : Only a little  Social Connections: Socially Integrated (11/25/2022)   Social Connection and Isolation Panel [NHANES]    Frequency of Communication with Friends and Family: More than three times a week    Frequency of Social Gatherings with Friends and Family: More than three times a week    Attends Religious Services: More than 4 times per year    Active Member of [employer] or Organizations: Yes    Attends Engineer, structural: More than 4 times per year    Marital Status: Married     Family History:  The patient's family history includes Atrial fibrillation in his brother, brother, and sister; Cancer in his maternal grandmother; Diabetes in his sister and son; Heart Problems in his sister; Heart disease in his maternal grandfather; Heart failure in his brother, father, mother, and sister; Hypertension in his sister; Pancreatitis in his son.   ROS:   Please see the history of present illness.    ROS All other systems  are reviewed and are negative.   PHYSICAL EXAM:   VS:  BP 112/62 (BP Location: Left Arm, Patient Position: Sitting, Cuff Size: Normal)   Pulse (!) 57   Ht 5'  11" (1.803 m)   Wt 185 lb 9.6 oz (84.2 kg)   SpO2 94%   BMI 25.89 kg/m       General: Alert, oriented x3, no distress, appears younger than stated age. Head: no evidence of trauma, PERRL, EOMI, no exophtalmos or lid lag, no myxedema, no xanthelasma; normal ears, nose and oropharynx Neck: normal jugular venous pulsations and no hepatojugular reflux; brisk carotid pulses without delay and no carotid bruits Chest: clear to auscultation, no signs of consolidation by percussion or palpation, normal fremitus, symmetrical and full respiratory excursions Cardiovascular: normal position and quality of the apical impulse, regular rhythm, normal first and widely split second heart sounds, no murmurs, rubs or gallops Abdomen: no tenderness or distention, no masses by palpation, no abnormal pulsatility or arterial bruits, normal bowel sounds, no hepatosplenomegaly Extremities: no clubbing, cyanosis; 1+ soft pitting ankle edema bilaterally; 2+ radial, ulnar and brachial pulses bilaterally; 2+ right femoral, posterior tibial and dorsalis pedis pulses; 2+ left femoral, posterior tibial and dorsalis pedis pulses; no subclavian or femoral bruits Neurological: grossly nonfocal Psych: Normal mood and affect     Wt Readings from Last 3 Encounters:  03/19/23 185 lb 9.6 oz (84.2 kg)  02/08/23 170 lb (77.1 kg)  10/22/22 178 lb (80.7 kg)      Studies/Labs Reviewed:   ECHO [facility]  Result Date: 09/10/2019 Left Ventricle: Normal left ventricle. Wall thickness is normal. Systolic function is normal. EF: 55-60%. Wall motion cannot be accurately assessed. Doppler parameters are indeterminate for diastolic function.  Right Ventricle: Right ventricle is mildly dilated. Abnormal tricuspid annular plane systolic excursion (TAPSE) <1.7 cm.  Right Atrium: Right  atrium is mildly dilated.  Tricuspid Valve: Tricuspid valve structure is normal. There is mild to moderate regurgitation. The right ventricular systolic pressure is moderately elevated (50-59 mmHg).   [facility] CT 09/07/2019  IMPRESSION:  1. No rib fracture or pneumothorax identified.  2. Stable atherosclerosis.  3. No definite soft tissue organ injury identified.  4. No significant change hypodense focus left kidney.  5. No free air or fluid identified.    EKG:    EKG Interpretation Date/Time:  Friday March 19 2023 11:29:57 EST Ventricular Rate:  57 PR Interval:  212 QRS Duration:  154 QT Interval:  496 QTC Calculation: 482 R Axis:   52  Text Interpretation: Sinus bradycardia with 1st degree A-V block with Premature atrial complexes Right bundle branch block When compared with ECG of 25-Oct-2021 13:59, Premature atrial complexes are now Present T wave inversion now evident in Inferior leads QT has shortened Confirmed by Kwabena Strutz ([id number]) on 03/19/2023 11:42:02 AM         Recent Labs: 08/28/2019 TSH 3.53 Hemoglobin 13.5  BMET    Component Value Date/Time   NA 141 03/15/2023 1120   K 5.3 (H) 03/15/2023 1120   CL 102 03/15/2023 1120   CO2 26 03/15/2023 1120   GLUCOSE 109 (H) 03/15/2023 1120   GLUCOSE 99 11/13/2021 0000   BUN 28 03/15/2023 1120   CREATININE 1.64 (H) 03/15/2023 1120   CREATININE 1.53 (H) 11/13/2021 0000   CALCIUM 9.6 03/15/2023 1120   GFRNONAA 50 (L) 10/29/2021 0546   GFRNONAA 38 (L) 05/07/2020 1130   GFRAA 44 (L) 05/07/2020 1130     Lipid Panel 08/28/2019 Cholesterol 160, triglycerides 79, HDL 41, LDL 104  Lipid Panel  No results found for: "CHOL", "TRIG", "HDL", "CHOLHDL", "VLDL", "LDLCALC", "LDLDIRECT", "LABVLDL"    Additional studies/ records that were reviewed today include:  Spirometry 05/16/2019 showing FEV1 1.77 L (68%), FVC 2.51 L) 70%), mild restrictive ventilatory impairment unchanged  ASSESSMENT:    1. PAF (paroxysmal  atrial fibrillation) (HCC)   2. Encounter for monitoring amiodarone therapy      PLAN:  In order of problems listed above:  AFib/atypical atrial flutter: Have not seen any a recurrence of true atrial fibrillation, but his burden of brief atrial tachycardia has been so high that it could easily have caused tachycardia cardiomyopathy, in turn leading to worsening signs and symptoms of heart failure.  It is hard to say just during this office visit, but there seems to be some improvement after we increase the dose of amiodarone.  I think we will now decrease it back to 200 mg daily but 7 days a week.  Recheck an event monitor in about 2 months.  He is anticoagulated appropriately: CHADSVasc at least 4 (age 39, HTN, CHF). Amiodarone: Normal liver function test and thyroid function test a couple of days ago. Tachybradycardia syndrome: He does have occasional sinus bradycardia, but the lowest heart rate on his 2-week monitor was only 45 bpm.  He has first-degree AV block and RBBB.  Hopefully he will tolerate amiodarone 200 mg daily 7 days a week.  Asymptomatic.  No indication for pacemaker yet. CHF: He is roughly 5 pounds above his ideal "dry weight".  Dosing the diuretic appropriately.  Avoid rapid or excessive diuresis since he has a history of orthostatic hypotension. COPD:  on long-acting bronchodilators.  Will be stopping his beta-blocker altogether.  Frequent episodes of pneumonia over the last few years may be related to aspiration as well as underlying COPD. HTN: Remains very well-controlled without medications.  When taking losartan he was having worsening problems with orthostatic dizziness and so we stopped this.   CKD 3b: Stable.  Baseline GFR seems to be around 40-45, creatinine 1.5-1.6 range.  Medication Adjustments/Labs and Tests Ordered: Current medicines are reviewed at length with the patient today.  Concerns regarding medicines are outlined above.  Medication changes, Labs and Tests  ordered today are listed in the Patient Instructions below. Patient Instructions  Medication Instructions:  AMIODARONE 200 MG DAILY *If you need a refill on your cardiac medications before your next appointment, please call your pharmacy*  Procedures: Your physician has recommended that you wear a 3 DAY ZIO-PATCH monitor (in March 2025). The Zio patch cardiac monitor continuously records heart rhythm data for up to 14 days, this is for patients being evaluated for multiple types heart rhythms. For the first 24 hours post application, please avoid getting the Zio monitor wet in the shower or by excessive sweating during exercise. After that, feel free to carry on with regular activities. Keep soaps and lotions away from the ZIO XT Patch.  This will be mailed to you, please expect 7-10 days to receive.    Applying the monitor   Shave hair from upper left chest.   Hold abrader disc by orange tab.  Rub abrader in 40 strokes over left upper chest as indicated in your monitor instructions.   Clean area with 4 enclosed alcohol pads .  Use all pads to assure are is cleaned thoroughly.  Let dry.   Apply patch as indicated in monitor instructions.  Patch will be place under collarbone on left side of chest with arrow pointing upward.   Rub patch adhesive wings for 2 minutes.Remove white label marked "1".  Remove white label marked "2".  Rub patch adhesive wings for 2  additional minutes.   While looking in a mirror, press and release button in center of patch.  A small green light will flash 3-4 times .  This will be your only indicator the monitor has been turned on.     Do not shower for the first 24 hours.  You may shower after the first 24 hours.   Press button if you feel a symptom. You will hear a small click.  Record Date, Time and Symptom in the Patient Log Book.   When you are ready to remove patch, follow instructions on last 2 pages of Patient Log Book.  Stick patch monitor onto last page  of Patient Log Book.   Place Patient Log Book in [place] box.  Use locking tab on box and tape box closed securely.  The [employer] and [employer] has [employer] on it.  Please place in mailbox as soon as possible.  Your physician should have your test results approximately 7 days after the monitor has been mailed back to [facility].   Call [facility] Customer Care at [phone] if you have questions regarding your ZIO XT patch monitor.  Call them immediately if you see an orange light blinking on your monitor.   If your monitor falls off in less than 4 days contact our Monitor department at [phone].  If your monitor becomes loose or falls off after 4 days call [employer] at [phone] for suggestions on securing your monitor   Follow-Up: At [facility], you and your health needs are our priority.  As part of our continuing mission to provide you with exceptional heart care, we have created designated Provider Care Teams.  These Care Teams include your primary Cardiologist (physician) and Advanced Practice Providers (APPs -  Physician Assistants and Nurse Practitioners) who all work together to provide you with the care you need, when you need it.  We recommend signing up for the patient portal called "MyChart".  Sign up information is provided on this After Visit Summary.  MyChart is used to connect with patients for Virtual Visits (Telemedicine).  Patients are able to view lab/test results, encounter notes, upcoming appointments, etc.  Non-urgent messages can be sent to your provider as well.   To learn more about what you can do with MyChart, go to [email or web address].    Your next appointment:   6 month(s)  Provider:   Thurmon Fair, MD       Signed, Thurmon Fair, MD  03/19/2023 5:42 PM    [facility] [address] [place], [place], [place]  [address] Phone: [phone]; Fax: [phone]

## 2023-03-22 ENCOUNTER — Ambulatory Visit: Payer: Medicare HMO | Attending: Cardiovascular Disease

## 2023-03-22 DIAGNOSIS — I480 Paroxysmal atrial fibrillation: Secondary | ICD-10-CM

## 2023-03-22 NOTE — Progress Notes (Unsigned)
Enrolled for [employer] to mail a ZIO XT long term holter monitor to the patients address on file. Request mail date of 04/24/23.

## 2023-03-31 ENCOUNTER — Encounter: Payer: Self-pay | Admitting: Family Medicine

## 2023-03-31 ENCOUNTER — Ambulatory Visit (INDEPENDENT_AMBULATORY_CARE_PROVIDER_SITE_OTHER): Payer: Medicare HMO | Admitting: Family Medicine

## 2023-03-31 VITALS — BP 136/63 | HR 64 | Ht 71.0 in | Wt 185.0 lb

## 2023-03-31 DIAGNOSIS — W19XXXA Unspecified fall, initial encounter: Secondary | ICD-10-CM | POA: Insufficient documentation

## 2023-03-31 DIAGNOSIS — T148XXA Other injury of unspecified body region, initial encounter: Secondary | ICD-10-CM | POA: Diagnosis not present

## 2023-03-31 NOTE — Progress Notes (Signed)
 Samuel Mcconnell - 89 y.o. male MRN [id number]  Date of birth: 1927-06-07  Subjective Chief Complaint  Patient presents with   Fall    HPI Samuel Mcconnell is a 89 y.o. male here today with complaint of fall.  He had a fall a few days ago.  He has propped his R leg on a chair and fell backward. He did not lose consciousness.  Landed on  his L buttock.  Has skin tear over the L elbow with bruising down the L side of his trunk and leg.  He did hit his head but not too hard and denies neck pain, headache or neurological changes.  He is on Eliquis .  He is able to weight bear without pain or difficulty.   ROS:  A comprehensive ROS was completed and negative except as noted per HPI  Allergies  Allergen Reactions   Clindamycin/Lincomycin Other (See Comments)    Patient felt weird    Dabigatran Etexilate Mesylate Other (See Comments)    felt weird (name brand Pradaxa)   Doxycycline  Other (See Comments)    Dyspepsia    Past Medical History:  Diagnosis Date   Arthritis    Atrial fibrillation (HCC)    radiofrequency ablation   Cataract    CHF (congestive heart failure) (HCC)    Clotting disorder (HCC)    due to medication   Emphysema    Emphysema of lung (HCC)    Hypertension    Thyroid  disease    Ulcer    GI years prior    Past Surgical History:  Procedure Laterality Date   CARDIAC SURGERY     CARDIOVERSION N/A 10/24/2019   Procedure: CARDIOVERSION;  Surgeon: Francyne Headland, MD;  Location: [facility];  Service: Cardiovascular;  Laterality: N/A;   CARDIOVERSION N/A 10/22/2020   Procedure: CARDIOVERSION;  Surgeon: Alveta Aleene PARAS, MD;  Location: [facility]OSCOPY;  Service: Cardiovascular;  Laterality: N/A;   COLON SURGERY  03/23/1997   EYE SURGERY     KNEE ARTHROSCOPY Left 07/21/2000   LOOP RECORDER IMPLANT  01/16/2010   Medtronic Reveal XT (Dr. HILARIO Jester)    RADIOFREQUENCY ABLATION  06/13/2009   SHOULDER ARTHROSCOPY WITH ROTATOR CUFF REPAIR Left 04/23/1996   TIBIAL  TUBERCLERPLASTY  ~ 10/2010   TRANSTHORACIC ECHOCARDIOGRAM  11/19/2011   EF=>55%; mild MR, mild mitral annular calcif; mild TR, normal RSVP; AV mildly sclerotic, mild AV regurg; trace pulm valve regurg     Social History   Socioeconomic History   Marital status: Married    Spouse name: Dickey   Number of children: 2   Years of education: 16   Highest education level: Bachelor's degree (e.g., BA, AB, BS)  Occupational History   Occupation: Retired  Tobacco Use   Smoking status: Former    Current packs/day: 0.00    Types: Cigarettes    Start date: 03/14/1956    Quit date: 03/14/2002    Years since quitting: 21.0   Smokeless tobacco: Never   Tobacco comments:    Former smoker 01/23/2021  Vaping Use   Vaping status: Never Used  Substance and Sexual Activity   Alcohol use: Yes    Alcohol/week: 1.0 standard drink of alcohol    Types: 1 Glasses of wine per week   Drug use: No   Sexual activity: Not Currently    Partners: Female    Birth control/protection: None  Other Topics Concern   Not on file  Social History Narrative   Lives at [employer] with his  wife. He is still driving. He enjoys walking and going to the gym.   Social Drivers of Corporate Investment Banker Strain: Low Risk  (03/30/2023)   Overall Financial Resource Strain (CARDIA)    Difficulty of Paying Living Expenses: Not hard at all  Food Insecurity: No Food Insecurity (03/30/2023)   Hunger Vital Sign    Worried About Running Out of Food in the Last Year: Never true    Ran Out of Food in the Last Year: Never true  Transportation Needs: No Transportation Needs (03/30/2023)   PRAPARE - Administrator, Civil Service (Medical): No    Lack of Transportation (Non-Medical): No  Physical Activity: Inactive (03/30/2023)   Exercise Vital Sign    Days of Exercise per Week: 0 days    Minutes of Exercise per Session: 20 min  Stress: No Stress Concern Present (03/30/2023)   [employer] of Occupational  Health - Occupational Stress Questionnaire    Feeling of Stress : Only a little  Social Connections: Socially Integrated (03/30/2023)   Social Connection and Isolation Panel [NHANES]    Frequency of Communication with Friends and Family: More than three times a week    Frequency of Social Gatherings with Friends and Family: More than three times a week    Attends Religious Services: More than 4 times per year    Active Member of Clubs or Organizations: Yes    Attends Engineer, Structural: More than 4 times per year    Marital Status: Married    Family History  Problem Relation Age of Onset   Heart failure Mother    Heart failure Father        MI - died @ 19   Atrial fibrillation Sister    Hypertension Sister    Heart Problems Sister    Diabetes Sister    Heart failure Sister    Atrial fibrillation Brother        dx'ed age 29   Atrial fibrillation Brother        dx'ed age 66   Heart failure Brother    Diabetes Son    Pancreatitis Son        also ETOH abuse    Cancer Maternal Grandmother    Heart disease Maternal Grandfather     Health Maintenance  Topic Date Due   COVID-19 Vaccine (7 - 2024-25 season) 02/05/2023   Medicare Annual Wellness (AWV)  11/25/2023   DTaP/Tdap/Td (3 - Td or Tdap) 09/06/2029   Pneumonia Vaccine 65+ Years old  Completed   INFLUENZA VACCINE  Completed   Zoster Vaccines- Shingrix  Completed   HPV VACCINES  Aged Out     ----------------------------------------------------------------------------------------------------------------------------------------------------------------------------------------------------------------- Physical Exam BP 136/63 (BP Location: Left Arm, Patient Position: Sitting, Cuff Size: Normal)   Pulse 64   Ht 5' 11 (1.803 m)   Wt 185 lb (83.9 kg)   SpO2 95%   BMI 25.80 kg/m   Physical Exam Constitutional:      Appearance: Normal appearance.  Musculoskeletal:     Comments: ROM of Hip and knee is normal.   Normal strength in LLE.    Skin:    Comments: Extensive bruising down that L buttock, hip and leg.  Firmness over L buttock with hematoma formation.   Neurological:     Mental Status: He is alert.     ------------------------------------------------------------------------------------------------------------------------------------------------------------------------------------------------------------------- Assessment and Plan  Hematoma He had recent mechanical fall with bruising and hematoma over buttock.  Checking CBC today to  be sure H&H are stable.  Recommend continued ice application.  Red flags discussed.  He has FROM and good strength in all extremities.  I do not think imaging is indicated at this time.    No orders of the defined types were placed in this encounter.   No follow-ups on file.    This visit occurred during the SARS-CoV-2 public health emergency.  Safety protocols were in place, including screening questions prior to the visit, additional usage of staff PPE, and extensive cleaning of exam room while observing appropriate contact time as indicated for disinfecting solutions.

## 2023-03-31 NOTE — Assessment & Plan Note (Signed)
 He had recent mechanical fall with bruising and hematoma over buttock.  Checking CBC today to be sure H&H are stable.  Recommend continued ice application.  Red flags discussed.  He has FROM and good strength in all extremities.  I do not think imaging is indicated at this time.

## 2023-04-01 ENCOUNTER — Telehealth: Payer: Self-pay | Admitting: Cardiovascular Disease

## 2023-04-01 LAB — CBC WITH DIFFERENTIAL/PLATELET
Basophils Absolute: 0.1 10*3/uL (ref 0.0–0.2)
Basos: 1 %
EOS (ABSOLUTE): 0.3 10*3/uL (ref 0.0–0.4)
Eos: 3 %
Hematocrit: 32.6 % — ABNORMAL LOW (ref 37.5–51.0)
Hemoglobin: 10.4 g/dL — ABNORMAL LOW (ref 13.0–17.7)
Immature Grans (Abs): 0 10*3/uL (ref 0.0–0.1)
Immature Granulocytes: 0 %
Lymphocytes Absolute: 1.5 10*3/uL (ref 0.7–3.1)
Lymphs: 17 %
MCH: 29.7 pg (ref 26.6–33.0)
MCHC: 31.9 g/dL (ref 31.5–35.7)
MCV: 93 fL (ref 79–97)
Monocytes Absolute: 0.9 10*3/uL (ref 0.1–0.9)
Monocytes: 10 %
Neutrophils Absolute: 6 10*3/uL (ref 1.4–7.0)
Neutrophils: 69 %
Platelets: 292 10*3/uL (ref 150–450)
RBC: 3.5 x10E6/uL — ABNORMAL LOW (ref 4.14–5.80)
RDW: 13.4 % (ref 11.6–15.4)
WBC: 8.7 10*3/uL (ref 3.4–10.8)

## 2023-04-01 NOTE — Telephone Encounter (Signed)
 Patient's daughter states heart monitor has been received but they were advised that patient does not need to begin monitoring cycle until March. Daughter states the company has notified them that the patient needs to apply the monitor now. Please advise.

## 2023-04-02 ENCOUNTER — Other Ambulatory Visit: Payer: Self-pay | Admitting: Family Medicine

## 2023-04-02 DIAGNOSIS — T148XXA Other injury of unspecified body region, initial encounter: Secondary | ICD-10-CM

## 2023-04-02 NOTE — Telephone Encounter (Signed)
 Patient identification verified by 2 forms. Bertina Cooks, RN    Called and spoke to patients daughter [name] [name] states:   -she has received monitor   -plan for Dr. Francyne is to start monitor 05/2023  -company has outreached, requesting that patient wear monitor  Informed [name]:   -as long as expiration date is after 05/2023 patient can wait to wear monitor   -if expiration date is prior to 05/2023, patient can start wearing monitor now   -RN confirmed with Dr. Francyne RN okay to begin monitor now if needed  [name] verbalized understanding, no questions at this time

## 2023-04-08 LAB — CBC WITH DIFFERENTIAL/PLATELET
Basophils Absolute: 0.1 10*3/uL (ref 0.0–0.2)
Basos: 1 %
EOS (ABSOLUTE): 0.3 10*3/uL (ref 0.0–0.4)
Eos: 4 %
Hematocrit: 33.4 % — ABNORMAL LOW (ref 37.5–51.0)
Hemoglobin: 10.7 g/dL — ABNORMAL LOW (ref 13.0–17.7)
Immature Grans (Abs): 0 10*3/uL (ref 0.0–0.1)
Immature Granulocytes: 0 %
Lymphocytes Absolute: 1.8 10*3/uL (ref 0.7–3.1)
Lymphs: 24 %
MCH: 30.5 pg (ref 26.6–33.0)
MCHC: 32 g/dL (ref 31.5–35.7)
MCV: 95 fL (ref 79–97)
Monocytes Absolute: 0.7 10*3/uL (ref 0.1–0.9)
Monocytes: 9 %
Neutrophils Absolute: 4.6 10*3/uL (ref 1.4–7.0)
Neutrophils: 62 %
Platelets: 345 10*3/uL (ref 150–450)
RBC: 3.51 x10E6/uL — ABNORMAL LOW (ref 4.14–5.80)
RDW: 14.7 % (ref 11.6–15.4)
WBC: 7.5 10*3/uL (ref 3.4–10.8)

## 2023-04-19 ENCOUNTER — Ambulatory Visit: Payer: Self-pay | Admitting: Family Medicine

## 2023-04-19 NOTE — Telephone Encounter (Signed)
  Chief Complaint: Cough Symptoms: Cough, Sore Throat Frequency: Acute Pertinent Negatives: Patient denies chest pain, or shortness of breath.  Disposition: [] ED /[] Urgent Care (no appt availability in office) / [x] Appointment(In office/virtual)/ []  [place] [facility]/ [] Home Care/ [] Refused Recommended Disposition /[] [place] [facility]/ []  Follow-up with PCP Additional Notes: OM is being triaged today for a cough and cold like symptoms. Patient denies acute distress like symptoms and was instructed to call the office for rx if symptoms returned or worsened. In office appointment made for tomorrow.     Reason for Disposition  [1] Known COPD or other severe lung disease (i.e., bronchiectasis, cystic fibrosis, lung surgery) AND [2] worsening symptoms (i.e., increased sputum purulence or amount, increased breathing difficulty  Answer Assessment - Initial Assessment Questions 1. ONSET: "When did the cough begin?"      Yesterday  2. SEVERITY: "How bad is the cough today?"      Strong, Intermittent  3. SPUTUM: "Describe the color of your sputum" (none, dry cough; clear, white, yellow, green)     Yellow, Thick  4. HEMOPTYSIS: "Are you coughing up any blood?" If so ask: "How much?" (flecks, streaks, tablespoons, etc.)     No  5. DIFFICULTY BREATHING: "Are you having difficulty breathing?" If Yes, ask: "How bad is it?" (e.g., mild, moderate, severe)    - MILD: No SOB at rest, mild SOB with walking, speaks normally in sentences, can lie down, no retractions, pulse < 100.    - MODERATE: SOB at rest, SOB with minimal exertion and prefers to sit, cannot lie down flat, speaks in phrases, mild retractions, audible wheezing, pulse 100-120.    - SEVERE: Very SOB at rest, speaks in single words, struggling to breathe, sitting hunched forward, retractions, pulse > 120      No  6. FEVER: "Do you have a fever?" If Yes, ask: "What is your temperature, how was it measured, and when did it start?"      No  7. CARDIAC HISTORY: "Do you have any history of heart disease?" (e.g., heart attack, congestive heart failure)      Afib  8. LUNG HISTORY: "Do you have any history of lung disease?"  (e.g., pulmonary embolus, asthma, emphysema)     COPD  9. PE RISK FACTORS: "Do you have a history of blood clots?" (or: recent major surgery, recent prolonged travel, bedridden)     No  10. OTHER SYMPTOMS: "Do you have any other symptoms?" (e.g., runny nose, wheezing, chest pain)        Chest Congestion  12. TRAVEL: "Have you traveled out of the country in the last month?" (e.g., travel history, exposures)       Mother in [place], Bronchitis  Protocols used: Cough - Acute Productive-A-AH

## 2023-04-20 ENCOUNTER — Encounter: Payer: Self-pay | Admitting: Family Medicine

## 2023-04-20 ENCOUNTER — Ambulatory Visit (INDEPENDENT_AMBULATORY_CARE_PROVIDER_SITE_OTHER): Payer: Medicare HMO | Admitting: Family Medicine

## 2023-04-20 ENCOUNTER — Ambulatory Visit: Payer: Medicare HMO

## 2023-04-20 VITALS — BP 123/60 | HR 87 | Temp 97.6°F | Ht 71.0 in | Wt 185.0 lb

## 2023-04-20 DIAGNOSIS — R062 Wheezing: Secondary | ICD-10-CM

## 2023-04-20 DIAGNOSIS — R051 Acute cough: Secondary | ICD-10-CM | POA: Diagnosis not present

## 2023-04-20 DIAGNOSIS — J209 Acute bronchitis, unspecified: Secondary | ICD-10-CM | POA: Diagnosis not present

## 2023-04-20 DIAGNOSIS — R6889 Other general symptoms and signs: Secondary | ICD-10-CM | POA: Diagnosis not present

## 2023-04-20 DIAGNOSIS — R059 Cough, unspecified: Secondary | ICD-10-CM

## 2023-04-20 DIAGNOSIS — I700 Atherosclerosis of aorta: Secondary | ICD-10-CM | POA: Diagnosis not present

## 2023-04-20 LAB — POCT INFLUENZA A/B
Influenza A, POC: NEGATIVE
Influenza B, POC: NEGATIVE

## 2023-04-20 LAB — POC COVID19 BINAXNOW: SARS Coronavirus 2 Ag: NEGATIVE

## 2023-04-20 MED ORDER — PREDNISONE 20 MG PO TABS: 20.0000 mg | ORAL_TABLET | Freq: Every day | ORAL | 0 refills | Status: AC

## 2023-04-20 MED ORDER — ALBUTEROL SULFATE HFA 108 (90 BASE) MCG/ACT IN AERS: 2.0000 | INHALATION_SPRAY | Freq: Four times a day (QID) | RESPIRATORY_TRACT | 1 refills | Status: DC | PRN

## 2023-04-20 NOTE — Assessment & Plan Note (Signed)
POC flu and COVID are negative.  2 view CXR ordered.  Adding prednisone burst and albuterol.

## 2023-04-20 NOTE — Telephone Encounter (Signed)
Patient seen in office today.

## 2023-04-20 NOTE — Progress Notes (Signed)
Samuel Mcconnell - 89 y.o. male MRN [id number]  Date of birth: Apr 02, 1927  Subjective Chief Complaint  Patient presents with   URI    HPI Samuel Mcconnell is a 89 y.o. male here today with complaint of cough and congestion.  Symptoms started about 3 days ago.  He has had some body aches.  He does have history of aspiration pneumonia in the past.  Denies fever or chills.  No GI symptoms at this time.  He continues on [employer] inhaler.   ROS:  A comprehensive ROS was completed and negative except as noted per HPI  Allergies  Allergen Reactions   Clindamycin/Lincomycin Other (See Comments)    Patient "felt weird"    Dabigatran Etexilate Mesylate Other (See Comments)    "felt weird" (name brand Pradaxa)   Doxycycline Other (See Comments)    Dyspepsia    Past Medical History:  Diagnosis Date   Arthritis    Atrial fibrillation (HCC)    radiofrequency ablation   Cataract    CHF (congestive heart failure) (HCC)    Clotting disorder (HCC)    due to medication   Emphysema    Emphysema of lung (HCC)    Hypertension    Thyroid disease    Ulcer    GI years prior    Past Surgical History:  Procedure Laterality Date   CARDIAC SURGERY     CARDIOVERSION N/A 10/24/2019   Procedure: CARDIOVERSION;  Surgeon: Thurmon Fair, MD;  Location: [facility]OSCOPY;  Service: Cardiovascular;  Laterality: N/A;   CARDIOVERSION N/A 10/22/2020   Procedure: CARDIOVERSION;  Surgeon: Vesta Mixer, MD;  Location: [facility] ENDOSCOPY;  Service: Cardiovascular;  Laterality: N/A;   COLON SURGERY  03/23/1997   EYE SURGERY     KNEE ARTHROSCOPY Left 07/21/2000   LOOP RECORDER IMPLANT  01/16/2010   Medtronic Reveal XT (Dr. Claudia Desanctis)    RADIOFREQUENCY ABLATION  06/13/2009   SHOULDER ARTHROSCOPY WITH ROTATOR CUFF REPAIR Left 04/23/1996   TIBIAL TUBERCLERPLASTY  ~ 10/2010   TRANSTHORACIC ECHOCARDIOGRAM  11/19/2011   EF=>55%; mild MR, mild mitral annular calcif; mild TR, normal RSVP; AV mildly sclerotic, mild AV regurg;  trace pulm valve regurg     Social History   Socioeconomic History   Marital status: Married    Spouse name: [name]   Number of children: 2   Years of education: 16   Highest education level: Bachelor's degree (e.g., BA, AB, BS)  Occupational History   Occupation: Retired  Tobacco Use   Smoking status: Former    Current packs/day: 0.00    Types: Cigarettes    Start date: 03/14/1956    Quit date: 03/14/2002    Years since quitting: 21.1   Smokeless tobacco: Never   Tobacco comments:    Former smoker 01/23/2021  Vaping Use   Vaping status: Never Used  Substance and Sexual Activity   Alcohol use: Yes    Alcohol/week: 1.0 standard drink of alcohol    Types: 1 Glasses of wine per week   Drug use: No   Sexual activity: Not Currently    Partners: Female    Birth control/protection: None  Other Topics Concern   Not on file  Social History Narrative   Lives at [employer] [employer] with his wife. He is still driving. He enjoys walking and going to the gym.   Social Drivers of Corporate investment banker Strain: Low Risk  (03/30/2023)   Overall Financial Resource Strain (CARDIA)    Difficulty of Paying Living  Expenses: Not hard at all  Food Insecurity: No Food Insecurity (03/30/2023)   Hunger Vital Sign    Worried About Running Out of Food in the Last Year: Never true    Ran Out of Food in the Last Year: Never true  Transportation Needs: No Transportation Needs (03/30/2023)   PRAPARE - Administrator, Civil Service (Medical): No    Lack of Transportation (Non-Medical): No  Physical Activity: Inactive (03/30/2023)   Exercise Vital Sign    Days of Exercise per Week: 0 days    Minutes of Exercise per Session: 20 min  Stress: No Stress Concern Present (03/30/2023)   [employer] of Occupational Health - Occupational Stress Questionnaire    Feeling of Stress : Only a little  Social Connections: Socially Integrated (03/30/2023)   Social Connection and Isolation Panel  [NHANES]    Frequency of Communication with Friends and Family: More than three times a week    Frequency of Social Gatherings with Friends and Family: More than three times a week    Attends Religious Services: More than 4 times per year    Active Member of Clubs or Organizations: Yes    Attends Engineer, structural: More than 4 times per year    Marital Status: Married    Family History  Problem Relation Age of Onset   Heart failure Mother    Heart failure Father        MI - died @ 37   Atrial fibrillation Sister    Hypertension Sister    Heart Problems Sister    Diabetes Sister    Heart failure Sister    Atrial fibrillation Brother        dx'ed age 69   Atrial fibrillation Brother        dx'ed age 35   Heart failure Brother    Diabetes Son    Pancreatitis Son        also ETOH abuse    Cancer Maternal Grandmother    Heart disease Maternal Grandfather     Health Maintenance  Topic Date Due   COVID-19 Vaccine (7 - 2024-25 season) 02/05/2023   Medicare Annual Wellness (AWV)  11/25/2023   DTaP/Tdap/Td (3 - Td or Tdap) 09/06/2029   Pneumonia Vaccine 59+ Years old  Completed   INFLUENZA VACCINE  Completed   Zoster Vaccines- Shingrix  Completed   HPV VACCINES  Aged Out     ----------------------------------------------------------------------------------------------------------------------------------------------------------------------------------------------------------------- Physical Exam BP 123/60 (BP Location: Right Arm, Patient Position: Sitting, Cuff Size: Large)   Pulse 87   Temp 97.6 F (36.4 C) (Oral)   Ht 5\' 11"  (1.803 m)   Wt 185 lb (83.9 kg)   SpO2 (!) 86%   BMI 25.80 kg/m   Physical Exam Constitutional:      Appearance: Normal appearance.  HENT:     Head: Normocephalic and atraumatic.  Eyes:     General: No scleral icterus. Cardiovascular:     Rate and Rhythm: Normal rate and regular rhythm.  Pulmonary:     Effort: Pulmonary effort  is normal.     Breath sounds: Wheezing present.     Comments: LLL crackles Musculoskeletal:     Cervical back: Neck supple.  Skin:    General: Skin is warm and dry.  Neurological:     General: No focal deficit present.     Mental Status: He is alert.  Psychiatric:        Mood and Affect: Mood normal.  Behavior: Behavior normal.     ------------------------------------------------------------------------------------------------------------------------------------------------------------------------------------------------------------------- Assessment and Plan  Acute bronchitis POC flu and COVID are negative.  2 view CXR ordered.  Adding prednisone burst and albuterol.    Meds ordered this encounter  Medications   predniSONE (DELTASONE) 20 MG tablet    Sig: Take 1 tablet (20 mg total) by mouth daily with breakfast for 5 days.    Dispense:  10 tablet    Refill:  0   albuterol (VENTOLIN HFA) 108 (90 Base) MCG/ACT inhaler    Sig: Inhale 2 puffs into the lungs every 6 (six) hours as needed for wheezing or shortness of breath.    Dispense:  8 g    Refill:  1    No follow-ups on file.    This visit occurred during the SARS-CoV-2 public health emergency.  Safety protocols were in place, including screening questions prior to the visit, additional usage of staff PPE, and extensive cleaning of exam room while observing appropriate contact time as indicated for disinfecting solutions.

## 2023-04-21 ENCOUNTER — Telehealth: Payer: Self-pay

## 2023-04-21 ENCOUNTER — Telehealth: Payer: Self-pay | Admitting: Family Medicine

## 2023-04-21 NOTE — Telephone Encounter (Signed)
Copied from CRM [id number]. Topic: Clinical - Prescription Issue >> Apr 20, 2023  3:25 PM Clayton Bibles wrote: Reason for CRM:    [facility] is calling about medication  predniSONE (DELTASONE) 20 MG tablet. They have a question about the quantity that was ordered. Please call [phone] - Patient was not at Pharmacy

## 2023-04-21 NOTE — Telephone Encounter (Signed)
Patient's daughter advised.

## 2023-04-21 NOTE — Telephone Encounter (Signed)
The patient's daughter wanted to confirm he needs to take the prednisone for 5 days only. The prescription has 10 tablets.

## 2023-04-21 NOTE — Telephone Encounter (Signed)
Copied from CRM [id number]. Topic: Clinical - Medication Question >> Apr 21, 2023  9:58 AM Patsy Lager T wrote: Reason for CRM: Patients daughter [name] request a call back as the instructions are not clear to her on patient prescription or prednisone. Please f/u with [name]  Take 1 tablet (20 mg total) by mouth daily with breakfast for 5 days.

## 2023-04-21 NOTE — Telephone Encounter (Signed)
Spoke with patient daughter brenda  States prednisone 20mg   Was written as one tablet daily for 5 days but quantity of #10 was given  She is wanting to know if he is suppose to take one tablet daily or two per day for 5 days.

## 2023-05-09 ENCOUNTER — Emergency Department (HOSPITAL_COMMUNITY): Payer: Medicare HMO

## 2023-05-09 ENCOUNTER — Other Ambulatory Visit: Payer: Self-pay

## 2023-05-09 ENCOUNTER — Inpatient Hospital Stay (HOSPITAL_COMMUNITY)
Admission: EM | Admit: 2023-05-09 | Discharge: 2023-05-11 | DRG: 189 | Disposition: A | Discharge status: Home Health | Payer: Medicare HMO | Attending: Internal Medicine | Admitting: Internal Medicine

## 2023-05-09 DIAGNOSIS — M1990 Unspecified osteoarthritis, unspecified site: Secondary | ICD-10-CM | POA: Diagnosis present

## 2023-05-09 DIAGNOSIS — I5032 Chronic diastolic (congestive) heart failure: Secondary | ICD-10-CM | POA: Diagnosis present

## 2023-05-09 DIAGNOSIS — Z8249 Family history of ischemic heart disease and other diseases of the circulatory system: Secondary | ICD-10-CM | POA: Diagnosis not present

## 2023-05-09 DIAGNOSIS — R0689 Other abnormalities of breathing: Secondary | ICD-10-CM | POA: Diagnosis not present

## 2023-05-09 DIAGNOSIS — Z79899 Other long term (current) drug therapy: Secondary | ICD-10-CM

## 2023-05-09 DIAGNOSIS — J439 Emphysema, unspecified: Secondary | ICD-10-CM | POA: Diagnosis present

## 2023-05-09 DIAGNOSIS — J441 Chronic obstructive pulmonary disease with (acute) exacerbation: Secondary | ICD-10-CM | POA: Diagnosis present

## 2023-05-09 DIAGNOSIS — L03116 Cellulitis of left lower limb: Secondary | ICD-10-CM | POA: Diagnosis present

## 2023-05-09 DIAGNOSIS — N1831 Chronic kidney disease, stage 3a: Secondary | ICD-10-CM | POA: Diagnosis present

## 2023-05-09 DIAGNOSIS — Z881 Allergy status to other antibiotic agents status: Secondary | ICD-10-CM

## 2023-05-09 DIAGNOSIS — Z888 Allergy status to other drugs, medicaments and biological substances status: Secondary | ICD-10-CM | POA: Diagnosis not present

## 2023-05-09 DIAGNOSIS — D689 Coagulation defect, unspecified: Secondary | ICD-10-CM | POA: Diagnosis present

## 2023-05-09 DIAGNOSIS — E079 Disorder of thyroid, unspecified: Secondary | ICD-10-CM | POA: Diagnosis present

## 2023-05-09 DIAGNOSIS — J9601 Acute respiratory failure with hypoxia: Secondary | ICD-10-CM | POA: Diagnosis not present

## 2023-05-09 DIAGNOSIS — Z7901 Long term (current) use of anticoagulants: Secondary | ICD-10-CM

## 2023-05-09 DIAGNOSIS — I130 Hypertensive heart and chronic kidney disease with heart failure and stage 1 through stage 4 chronic kidney disease, or unspecified chronic kidney disease: Secondary | ICD-10-CM | POA: Diagnosis present

## 2023-05-09 DIAGNOSIS — I700 Atherosclerosis of aorta: Secondary | ICD-10-CM | POA: Diagnosis not present

## 2023-05-09 DIAGNOSIS — I5031 Acute diastolic (congestive) heart failure: Secondary | ICD-10-CM | POA: Diagnosis not present

## 2023-05-09 DIAGNOSIS — I480 Paroxysmal atrial fibrillation: Secondary | ICD-10-CM | POA: Diagnosis present

## 2023-05-09 DIAGNOSIS — Z87891 Personal history of nicotine dependence: Secondary | ICD-10-CM | POA: Diagnosis not present

## 2023-05-09 DIAGNOSIS — R0602 Shortness of breath: Secondary | ICD-10-CM | POA: Diagnosis present

## 2023-05-09 DIAGNOSIS — Z79890 Hormone replacement therapy (postmenopausal): Secondary | ICD-10-CM | POA: Diagnosis not present

## 2023-05-09 LAB — COMPREHENSIVE METABOLIC PANEL
ALT: 23 U/L (ref 0–44)
AST: 32 U/L (ref 15–41)
Albumin: 3.7 g/dL (ref 3.5–5.0)
Alkaline Phosphatase: 58 U/L (ref 38–126)
Anion gap: 8 (ref 5–15)
BUN: 25 mg/dL — ABNORMAL HIGH (ref 8–23)
CO2: 26 mmol/L (ref 22–32)
Calcium: 9 mg/dL (ref 8.9–10.3)
Chloride: 102 mmol/L (ref 98–111)
Creatinine, Ser: 1.38 mg/dL — ABNORMAL HIGH (ref 0.61–1.24)
GFR, Estimated: 48 mL/min — ABNORMAL LOW (ref >60)
Glucose, Bld: 116 mg/dL — ABNORMAL HIGH (ref 70–99)
Potassium: 4 mmol/L (ref 3.5–5.1)
Sodium: 136 mmol/L (ref 135–145)
Total Bilirubin: 0.7 mg/dL (ref 0.0–1.2)
Total Protein: 6.1 g/dL — ABNORMAL LOW (ref 6.5–8.1)

## 2023-05-09 LAB — CBC WITH DIFFERENTIAL/PLATELET
Abs Immature Granulocytes: 0.02 10*3/uL (ref 0.00–0.07)
Basophils Absolute: 0.1 10*3/uL (ref 0.0–0.1)
Basophils Relative: 1 %
Eosinophils Absolute: 0.6 10*3/uL — ABNORMAL HIGH (ref 0.0–0.5)
Eosinophils Relative: 7 %
HCT: 40.1 % (ref 39.0–52.0)
Hemoglobin: 12.8 g/dL — ABNORMAL LOW (ref 13.0–17.0)
Immature Granulocytes: 0 %
Lymphocytes Relative: 17 %
Lymphs Abs: 1.5 10*3/uL (ref 0.7–4.0)
MCH: 29.6 pg (ref 26.0–34.0)
MCHC: 31.9 g/dL (ref 30.0–36.0)
MCV: 92.8 fL (ref 80.0–100.0)
Monocytes Absolute: 0.7 10*3/uL (ref 0.1–1.0)
Monocytes Relative: 8 %
Neutro Abs: 5.9 10*3/uL (ref 1.7–7.7)
Neutrophils Relative %: 67 %
Platelets: 254 10*3/uL (ref 150–400)
RBC: 4.32 MIL/uL (ref 4.22–5.81)
RDW: 14.2 % (ref 11.5–15.5)
WBC: 8.7 10*3/uL (ref 4.0–10.5)
nRBC: 0 % (ref 0.0–0.2)

## 2023-05-09 LAB — BLOOD GAS, VENOUS
Acid-Base Excess: 4.7 mmol/L — ABNORMAL HIGH (ref 0.0–2.0)
Bicarbonate: 32 mmol/L — ABNORMAL HIGH (ref 20.0–28.0)
O2 Saturation: 50.8 %
Patient temperature: 37
pCO2, Ven: 58 mm[Hg] (ref 44–60)
pH, Ven: 7.35 (ref 7.25–7.43)
pO2, Ven: <31 mm[Hg] — CL (ref 32–45)

## 2023-05-09 LAB — RESP PANEL BY RT-PCR (RSV, FLU A&B, COVID)  RVPGX2
Influenza A by PCR: NEGATIVE
Influenza B by PCR: NEGATIVE
Resp Syncytial Virus by PCR: NEGATIVE
SARS Coronavirus 2 by RT PCR: NEGATIVE

## 2023-05-09 LAB — TSH: TSH: 3.261 u[IU]/mL (ref 0.350–4.500)

## 2023-05-09 LAB — BRAIN NATRIURETIC PEPTIDE: B Natriuretic Peptide: 130.2 pg/mL — ABNORMAL HIGH (ref 0.0–100.0)

## 2023-05-09 MED ORDER — FUROSEMIDE 10 MG/ML IJ SOLN
40.0000 mg | Freq: Once | INTRAMUSCULAR | Status: AC
  Administered 2023-05-09: 40 mg via INTRAVENOUS
  Filled 2023-05-09: qty 4

## 2023-05-09 MED ORDER — ONDANSETRON HCL 4 MG/2ML IJ SOLN: 4.0000 mg | Freq: Four times a day (QID) | INTRAMUSCULAR | Status: DC | PRN

## 2023-05-09 MED ORDER — APIXABAN 2.5 MG PO TABS
2.5000 mg | ORAL_TABLET | Freq: Two times a day (BID) | ORAL | Status: DC
  Administered 2023-05-09 – 2023-05-11 (×4): 2.5 mg via ORAL
  Filled 2023-05-09 (×4): qty 1

## 2023-05-09 MED ORDER — ROPINIROLE HCL 0.5 MG PO TABS
0.7500 mg | ORAL_TABLET | Freq: Every day | ORAL | Status: DC
  Administered 2023-05-09 – 2023-05-10 (×2): 0.75 mg via ORAL
  Filled 2023-05-09 (×2): qty 1

## 2023-05-09 MED ORDER — IPRATROPIUM-ALBUTEROL 0.5-2.5 (3) MG/3ML IN SOLN
3.0000 mL | Freq: Once | RESPIRATORY_TRACT | Status: AC
  Administered 2023-05-09: 3 mL via RESPIRATORY_TRACT
  Filled 2023-05-09: qty 3

## 2023-05-09 MED ORDER — ALBUTEROL SULFATE (2.5 MG/3ML) 0.083% IN NEBU
2.5000 mg | INHALATION_SOLUTION | Freq: Once | RESPIRATORY_TRACT | Status: AC
  Administered 2023-05-09: 2.5 mg via RESPIRATORY_TRACT
  Filled 2023-05-09: qty 3

## 2023-05-09 MED ORDER — FLUTICASONE FUROATE-VILANTEROL 200-25 MCG/ACT IN AEPB
1.0000 | INHALATION_SPRAY | Freq: Every day | RESPIRATORY_TRACT | Status: DC
  Administered 2023-05-10 – 2023-05-11 (×2): 1 via RESPIRATORY_TRACT
  Filled 2023-05-09: qty 28

## 2023-05-09 MED ORDER — SODIUM CHLORIDE 0.9 % IV SOLN
500.0000 mg | INTRAVENOUS | Status: DC
  Administered 2023-05-10: 500 mg via INTRAVENOUS
  Filled 2023-05-09 (×2): qty 5

## 2023-05-09 MED ORDER — BUDESON-GLYCOPYRROL-FORMOTEROL 160-9-4.8 MCG/ACT IN AERO: 2.0000 | INHALATION_SPRAY | Freq: Two times a day (BID) | RESPIRATORY_TRACT | Status: DC

## 2023-05-09 MED ORDER — ALBUTEROL SULFATE HFA 108 (90 BASE) MCG/ACT IN AERS
2.0000 | INHALATION_SPRAY | RESPIRATORY_TRACT | Status: DC | PRN
  Filled 2023-05-09: qty 6.7

## 2023-05-09 MED ORDER — METHYLPREDNISOLONE SODIUM SUCC 40 MG IJ SOLR
40.0000 mg | Freq: Two times a day (BID) | INTRAMUSCULAR | Status: DC
  Administered 2023-05-09 – 2023-05-11 (×4): 40 mg via INTRAVENOUS
  Filled 2023-05-09 (×4): qty 1

## 2023-05-09 MED ORDER — ACETAMINOPHEN 650 MG RE SUPP: 650.0000 mg | Freq: Four times a day (QID) | RECTAL | Status: DC | PRN

## 2023-05-09 MED ORDER — METHYLPREDNISOLONE SODIUM SUCC 125 MG IJ SOLR
125.0000 mg | Freq: Once | INTRAMUSCULAR | Status: AC
  Administered 2023-05-09: 125 mg via INTRAVENOUS
  Filled 2023-05-09: qty 2

## 2023-05-09 MED ORDER — SODIUM CHLORIDE 0.9 % IV SOLN
500.0000 mg | Freq: Once | INTRAVENOUS | Status: AC
  Administered 2023-05-09: 500 mg via INTRAVENOUS
  Filled 2023-05-09: qty 5

## 2023-05-09 MED ORDER — ALBUTEROL SULFATE (2.5 MG/3ML) 0.083% IN NEBU: 2.5000 mg | INHALATION_SOLUTION | RESPIRATORY_TRACT | Status: DC | PRN

## 2023-05-09 MED ORDER — UMECLIDINIUM BROMIDE 62.5 MCG/ACT IN AEPB
1.0000 | INHALATION_SPRAY | Freq: Every day | RESPIRATORY_TRACT | Status: DC
  Administered 2023-05-10 – 2023-05-11 (×2): 1 via RESPIRATORY_TRACT
  Filled 2023-05-09: qty 7

## 2023-05-09 MED ORDER — ACETAMINOPHEN 325 MG PO TABS: 650.0000 mg | ORAL_TABLET | Freq: Four times a day (QID) | ORAL | Status: DC | PRN

## 2023-05-09 MED ORDER — LEVOTHYROXINE SODIUM 137 MCG PO TABS
137.0000 ug | ORAL_TABLET | Freq: Every day | ORAL | Status: DC
  Administered 2023-05-10 – 2023-05-11 (×2): 137 ug via ORAL
  Filled 2023-05-09 (×2): qty 1

## 2023-05-09 MED ORDER — FLUTICASONE PROPIONATE 50 MCG/ACT NA SUSP
2.0000 | Freq: Every day | NASAL | Status: DC
  Administered 2023-05-10 – 2023-05-11 (×2): 2 via NASAL
  Filled 2023-05-09: qty 16

## 2023-05-09 MED ORDER — TRAZODONE HCL 50 MG PO TABS
25.0000 mg | ORAL_TABLET | Freq: Every evening | ORAL | Status: DC | PRN
  Administered 2023-05-10: 25 mg via ORAL
  Filled 2023-05-09: qty 1

## 2023-05-09 MED ORDER — SODIUM CHLORIDE 0.9 % IV SOLN
1.0000 g | INTRAVENOUS | Status: DC
  Administered 2023-05-10 – 2023-05-11 (×2): 1 g via INTRAVENOUS
  Filled 2023-05-09 (×2): qty 10

## 2023-05-09 MED ORDER — FUROSEMIDE 10 MG/ML IJ SOLN
40.0000 mg | Freq: Every day | INTRAMUSCULAR | Status: DC
  Administered 2023-05-10 – 2023-05-11 (×2): 40 mg via INTRAVENOUS
  Filled 2023-05-09 (×2): qty 4

## 2023-05-09 MED ORDER — ONDANSETRON HCL 4 MG PO TABS: 4.0000 mg | ORAL_TABLET | Freq: Four times a day (QID) | ORAL | Status: DC | PRN

## 2023-05-09 MED ORDER — AMIODARONE HCL 200 MG PO TABS
200.0000 mg | ORAL_TABLET | Freq: Every day | ORAL | Status: DC
  Administered 2023-05-10 – 2023-05-11 (×2): 200 mg via ORAL
  Filled 2023-05-09 (×2): qty 1

## 2023-05-09 MED ORDER — SODIUM CHLORIDE 0.9 % IV SOLN
1.0000 g | Freq: Once | INTRAVENOUS | Status: AC
  Administered 2023-05-09: 1 g via INTRAVENOUS
  Filled 2023-05-09: qty 10

## 2023-05-09 NOTE — Plan of Care (Signed)

## 2023-05-09 NOTE — ED Triage Notes (Signed)
Pt arrives from home with difficulty breathing since last night. Pt denies pain. Hx of COPD, A fib.

## 2023-05-09 NOTE — H&P (Addendum)
History and Physical  Samuel Mcconnell ZOX:[id number] DOB: July 03, 1927 DOA: 05/09/2023  PCP: Everrett Coombe, DO   Chief Complaint: Cough, shortness of breath  HPI: [name] is a 89 y.o. male with medical history significant for COPD on room air, paroxysmal atrial fibrillation on Eliquis, hypertension and chronic lower extremity edema on intermittent Lasix being admitted to the hospital with acute hypoxic respiratory failure in the setting of acute exacerbation of COPD.  History is provided by the patient as well as his daughter who is at the bedside and is a retired Engineer, civil (consulting).  They tell me that he was in his usual state of health until last night, when he started getting short of breath.  This was associated with cough productive of yellow sputum, as well as diffuse wheezing.  Continued to feel short of breath into this morning, so he was brought into the ER for evaluation.  His daughter tells me that he does have intermittent lower extremity edema, currently he does have some edema but this is no worse than it normally is.  He sometimes takes 1 or 2 Lasix tablets per day, for the last few days he has been taking more than usual.  Noticed in the ER that his left lower extremity was red, it did not look this way when she last saw his legs probably 3 or 4 days ago.  He has not had any fevers at home, but she thought that he felt hot last evening.  Workup in the emergency department consistent with acute exacerbation of COPD, possible heart failure exacerbation and left lower extremity cellulitis.  He was given empiric IV antibiotics, IV steroids, breathing treatments and hospitalist was contacted for admission.  Patient was placed on 3 L nasal cannula oxygen for hypoxia.  Review of Systems: Please see HPI for pertinent positives and negatives. A complete 10 system review of systems are otherwise negative.  Past Medical History:  Diagnosis Date   Arthritis    Atrial fibrillation (HCC)    radiofrequency  ablation   Cataract    CHF (congestive heart failure) (HCC)    Clotting disorder (HCC)    due to medication   Emphysema    Emphysema of lung (HCC)    Hypertension    Thyroid disease    Ulcer    GI years prior   Past Surgical History:  Procedure Laterality Date   CARDIAC SURGERY     CARDIOVERSION N/A 10/24/2019   Procedure: CARDIOVERSION;  Surgeon: Thurmon Fair, MD;  Location: [facility] ENDOSCOPY;  Service: Cardiovascular;  Laterality: N/A;   CARDIOVERSION N/A 10/22/2020   Procedure: CARDIOVERSION;  Surgeon: Vesta Mixer, MD;  Location: [facility] ENDOSCOPY;  Service: Cardiovascular;  Laterality: N/A;   COLON SURGERY  03/23/1997   EYE SURGERY     KNEE ARTHROSCOPY Left 07/21/2000   LOOP RECORDER IMPLANT  01/16/2010   Medtr[employer] Reveal XT (Dr. Claudia Desanctis)    RADIOFREQUENCY ABLATION  06/13/2009   SHOULDER ARTHROSCOPY WITH ROTATOR CUFF REPAIR Left 04/23/1996   TIBIAL TUBERCLERPLASTY  ~ 10/2010   TRANSTHORACIC ECHOCARDIOGRAM  11/19/2011   EF=>55%; mild MR, mild mitral annular calcif; mild TR, normal RSVP; AV mildly sclerotic, mild AV regurg; trace pulm valve regurg    Social History:  reports that he quit smoking about 21 years ago. His smoking use included cigarettes. He started smoking about 67 years ago. He has never used smokeless tobacco. He reports current alcohol use of about 1.0 standard drink of alcohol per week. He reports that he does  not use drugs.  Allergies  Allergen Reactions   Clindamycin/Lincomycin Other (See Comments)    Patient "felt weird"    Dabigatran Etexilate Mesylate Other (See Comments)    "felt weird" (name brand Pradaxa)   Doxycycline Other (See Comments)    Dyspepsia    Family History  Problem Relation Age of Onset   Heart failure Mother    Heart failure Father        MI - died @ 19   Atrial fibrillation Sister    Hypertension Sister    Heart Problems Sister    Diabetes Sister    Heart failure Sister    Atrial fibrillation Brother        dx'ed age 60    Atrial fibrillation Brother        dx'ed age 99   Heart failure Brother    Diabetes Son    Pancreatitis Son        also ETOH abuse    Cancer Maternal Grandmother    Heart disease Maternal Grandfather      Prior to Admission medications   Medication Sig Start Date End Date Taking? Authorizing Provider  acetaminophen (TYLENOL) 500 MG tablet Take 500 mg by mouth daily as needed for moderate pain.    [provider]  albuterol (VENTOLIN HFA) 108 (90 Base) MCG/ACT inhaler Inhale 2 puffs into the lungs every 6 (six) hours as needed for wheezing or shortness of breath. 04/20/23   Everrett Coombe, DO  AMBULATORY NON FORMULARY MEDICATION Please provide toilet riser/elevator with arms.  Diagnosis: Generalized weakness R53.1 07/11/21   Everrett Coombe, DO  amiodarone (PACERONE) 200 MG tablet Take 1 tablet (200 mg total) by mouth daily. 03/19/23   Croitoru, Mihai, MD  apixaban (ELIQUIS) 2.5 MG TABS tablet TAKE 1 TABLET TWICE DAILY (DOSE CHANGE) 09/21/22   Croitoru, Mihai, MD  Budeson-Glycopyrrol-Formoterol (BREZTRI AEROSPHERE) 160-9-4.8 MCG/ACT AERO Inhale 2 puffs into the lungs 2 (two) times daily. 02/08/23 05/09/23  Everrett Coombe, DO  Cholecalciferol (VITAMIN D3) 125 MCG (5000 UT) TABS Take 5,000 Units by mouth daily with lunch.    [provider]  diclofenac Sodium (VOLTAREN) 1 % GEL Apply 2 g topically 4 (four) times daily as needed (pain).    [provider]  diphenhydramine-acetaminophen (TYLENOL PM) 25-500 MG TABS tablet Take 1 tablet by mouth at bedtime as needed (for sleeplessness).    [provider]  fluticasone (FLONASE) 50 MCG/ACT nasal spray Place 2 sprays into both nostrils daily. 06/30/22 03/19/23  Everrett Coombe, DO  furosemide (LASIX) 20 MG tablet TAKE 1 TABLET EVERY DAY AS NEEDED FOR EDEMA 12/29/22   Everrett Coombe, DO  Ketotifen Fumarate (ITCHY EYE DROPS OP) Place 1 drop into both eyes daily as needed (itchy eyes).    [provider]   levothyroxine (SYNTHROID) 137 MCG tablet TAKE 1 TABLET EVERY DAY BEFORE BREAKFAST (NEED LABS AND MD APPOINTMENT) 07/28/22   Everrett Coombe, DO  Multiple Vitamin (MULTIVITAMIN) capsule Take 1 capsule by mouth daily.    [provider]  rOPINIRole (REQUIP) 0.25 MG tablet Take 3 tablets (0.75 mg total) by mouth at bedtime. 11/10/22 03/19/23  Everrett Coombe, DO  triamcinolone ointment (KENALOG) 0.1 % Apply 1 Application topically 2 (two) times daily as needed (irritation).    [provider]  zinc gluconate 50 MG tablet Take 50 mg by mouth daily with lunch.    [provider]    Physical Exam: BP (!) 178/82 (BP Location: Right Arm)   Pulse 77  Temp 98.4 F (36.9 C) (Oral)   Resp (!) 22   SpO2 90% Comment: nurse made aware General:  Alert, oriented, calm, in no acute distress, resting comfortably on 3 L nasal cannula oxygen saturating 97%.  Speaking full sentences, no cough.  His daughter is at the bedside. Eyes: EOMI, clear conjuctivae, white sclerea Neck: supple, no masses, trachea mildline  Cardiovascular: RRR, no murmurs or rubs, he has 2+ pitting bilateral lower extremity edema Respiratory: Breath sounds are distant bilaterally, with diffuse rhonchi.  No active wheezing.  No tachypnea.  No basilar crackles, or stridor.  Not in any respiratory distress. Abdomen: soft, nontender, nondistended, normal bowel tones heard  Skin: dry, no rashes left lower extremities warm to touch, with erythema.  No open areas, no drainage.  No areas of fluctuance or tenderness. Musculoskeletal: no joint effusions, normal range of motion  Psychiatric: appropriate affect, normal speech  Neurologic: extraocular muscles intact, clear speech, moving all extremities with intact sensorium         Labs on Admission:  Basic Metabolic Panel: Recent Labs  Lab 05/09/23 1259  NA 136  K 4.0  CL 102  CO2 26  GLUCOSE 116*  BUN 25*  CREATININE 1.38*  CALCIUM 9.0   Liver Function  Tests: Recent Labs  Lab 05/09/23 1259  AST 32  ALT 23  ALKPHOS 58  BILITOT 0.7  PROT 6.1*  ALBUMIN 3.7   No results for input(s): "LIPASE", "AMYLASE" in the last 168 hours. No results for input(s): "AMMONIA" in the last 168 hours. CBC: Recent Labs  Lab 05/09/23 1259  WBC 8.7  NEUTROABS 5.9  HGB 12.8*  HCT 40.1  MCV 92.8  PLT 254   Cardiac Enzymes: No results for input(s): "CKTOTAL", "CKMB", "CKMBINDEX", "TROPONINI" in the last 168 hours. BNP (last 3 results) Recent Labs    05/09/23 1259  BNP 130.2*    ProBNP (last 3 results) No results for input(s): "PROBNP" in the last 8760 hours.  CBG: No results for input(s): "GLUCAP" in the last 168 hours.  Radiological Exams on Admission: DG Chest Port 1 View Result Date: 05/09/2023 CLINICAL DATA:  Dyspnea.  Difficulty breathing since last night. EXAM: PORTABLE CHEST 1 VIEW COMPARISON:  04/20/2023 FINDINGS: Stable cardiomediastinal contours. Aortic atherosclerotic calcifications. There is no pleural fluid or interstitial edema. No pneumothorax. No superimposed airspace consolidation. The visualized osseous structures appear unremarkable. IMPRESSION: No acute cardiopulmonary abnormalities. Electronically Signed   By: Signa Kell M.D.   On: 05/09/2023 14:07   Assessment/Plan Marcellas Marchant is a 89 y.o. male with medical history significant for COPD on room air, paroxysmal atrial fibrillation on Eliquis, hypertension and chronic lower extremity edema on intermittent Lasix being admitted to the hospital with acute hypoxic respiratory failure in the setting of acute exacerbation of COPD.    Due to hypoxic respiratory failure-likely due to acute exacerbation of COPD, possible mild CHF exacerbation.  PE is very unlikely as he is anticoagulated, not tachycardic, and has no complaints of chest pain. -Observation admission -Supplemental oxygen as needed to keep O2 saturation greater than 90% -Treat acute COPD exacerbation as  below  Acute exacerbation of COPD-with diffuse rhonchi, increased cough, hypoxia.  Chest x-ray without clear evidence of acute infection. -Oxygen support as above -Continue home [place], Flonase, with short acting beta agonist as needed -Incentive spirometer and flutter valve -Received 125 mg IV Solu-Medrol in the ER, continue with 40 mg IV twice daily -Continue empiric IV azithromycin and IV Rocephin  Lower extremity cellulitis-with warmth,  erythema, he has no leukocytosis and is afebrile. -Empiric IV Rocephin  Atrial fibrillation-currently in sinus rhythm, continue home amiodarone and Eliquis anticoagulation  Heart failure with preserved EF-last echo 2018 with normal EF, this is presumed due to his chronic lower extremity edema.  He takes Lasix intermittently.  Today BNP is borderline elevated. -Received 40 mg IV Lasix in the emergency department, will continue this daily  CKD stage III-renal function appears to be at baseline, will monitor closely with daily labs especially in the setting of IV diuresis.  DVT prophylaxis: Continue home Eliquis    Code Status: Full Code  Consults called: None  Admission status: Observation  Time spent: 49 minutes  Osha Rane Sharlette Dense MD [facility] Pager [phone]  If 7PM-7AM, please contact night-coverage [email or web address] Password [facility]  05/09/2023, 2:44 PM

## 2023-05-09 NOTE — ED Provider Notes (Signed)
[place] EMERGENCY DEPARTMENT AT [facility] Provider Note   CSN: [id number] Arrival date & time: 05/09/23  1209     History  Chief Complaint  Patient presents with   Shortness of Breath    Samuel Mcconnell is a 89 y.o. male.   Shortness of Breath Associated symptoms: cough   Patient presents for shortness of breath.  Medical history includes atrial fibrillation, CHF, COPD, CKD, HTN.  Home medications include amiodarone, Eliquis, Lasix as needed.  He has had increased cough over the past week.  Cough has been productive of yellow sputum.  Last night, shortness of breath worsened.  He has also had recent worsening of swelling to his left leg.  Left leg was injured several weeks ago.  He has been taking his Lasix daily.  He did not get any today.  He remains on amiodarone and Eliquis.  He has an inhaler at home but does not utilize nebs.  He is on a daily Brezti inhaler.  Patient denies any new areas of pain.  At baseline, he is ambulatory with assistance.     Home Medications Prior to Admission medications   Medication Sig Start Date End Date Taking? Authorizing Provider  acetaminophen (TYLENOL) 500 MG tablet Take 500 mg by mouth daily as needed for moderate pain.    [provider]  albuterol (VENTOLIN HFA) 108 (90 Base) MCG/ACT inhaler Inhale 2 puffs into the lungs every 6 (six) hours as needed for wheezing or shortness of breath. 04/20/23   Everrett Coombe, DO  AMBULATORY NON FORMULARY MEDICATION Please provide toilet riser/elevator with arms.  Diagnosis: Generalized weakness R53.1 07/11/21   Everrett Coombe, DO  amiodarone (PACERONE) 200 MG tablet Take 1 tablet (200 mg total) by mouth daily. 03/19/23   Croitoru, Mihai, MD  apixaban (ELIQUIS) 2.5 MG TABS tablet TAKE 1 TABLET TWICE DAILY (DOSE CHANGE) 09/21/22   Croitoru, Mihai, MD  Budeson-Glycopyrrol-Formoterol (BREZTRI AEROSPHERE) 160-9-4.8 MCG/ACT AERO Inhale 2 puffs into the lungs 2 (two) times daily. 02/08/23  05/09/23  Everrett Coombe, DO  Cholecalciferol (VITAMIN D3) 125 MCG (5000 UT) TABS Take 5,000 Units by mouth daily with lunch.    [provider]  diclofenac Sodium (VOLTAREN) 1 % GEL Apply 2 g topically 4 (four) times daily as needed (pain).    [provider]  diphenhydramine-acetaminophen (TYLENOL PM) 25-500 MG TABS tablet Take 1 tablet by mouth at bedtime as needed (for sleeplessness).    [provider]  fluticasone (FLONASE) 50 MCG/ACT nasal spray Place 2 sprays into both nostrils daily. 06/30/22 03/19/23  Everrett Coombe, DO  furosemide (LASIX) 20 MG tablet TAKE 1 TABLET EVERY DAY AS NEEDED FOR EDEMA 12/29/22   Everrett Coombe, DO  Ketotifen Fumarate (ITCHY EYE DROPS OP) Place 1 drop into both eyes daily as needed (itchy eyes).    [provider]  levothyroxine (SYNTHROID) 137 MCG tablet TAKE 1 TABLET EVERY DAY BEFORE BREAKFAST (NEED LABS AND MD APPOINTMENT) 07/28/22   Everrett Coombe, DO  Multiple Vitamin (MULTIVITAMIN) capsule Take 1 capsule by mouth daily.    [provider]  rOPINIRole (REQUIP) 0.25 MG tablet Take 3 tablets (0.75 mg total) by mouth at bedtime. 11/10/22 03/19/23  Everrett Coombe, DO  triamcinolone ointment (KENALOG) 0.1 % Apply 1 Application topically 2 (two) times daily as needed (irritation).    [provider]  zinc gluconate 50 MG tablet Take 50 mg by mouth daily with lunch.    [provider]      Allergies  Clindamycin/lincomycin, Dabigatran etexilate mesylate, and Doxycycline    Review of Systems   Review of Systems  Constitutional:  Positive for fatigue.  Respiratory:  Positive for cough, chest tightness and shortness of breath.   Cardiovascular:  Positive for leg swelling.  All other systems reviewed and are negative.   Physical Exam Updated Vital Signs BP 133/71 (BP Location: Right Arm)   Pulse 71   Temp 98.1 F (36.7 C) (Oral)   Resp 18   SpO2 95%  Physical Exam Vitals and nursing note  reviewed.  Constitutional:      General: He is not in acute distress.    Appearance: He is well-developed. He is not ill-appearing, toxic-appearing or diaphoretic.  HENT:     Head: Normocephalic and atraumatic.     Mouth/Throat:     Mouth: Mucous membranes are moist.  Eyes:     Conjunctiva/sclera: Conjunctivae normal.  Cardiovascular:     Rate and Rhythm: Normal rate. Rhythm irregular.     Heart sounds: No murmur heard. Pulmonary:     Effort: Accessory muscle usage present. No respiratory distress.     Breath sounds: Wheezing and rales present.  Chest:     Chest wall: No tenderness.  Abdominal:     Palpations: Abdomen is soft.     Tenderness: There is no abdominal tenderness.  Musculoskeletal:        General: No swelling.     Cervical back: Normal range of motion and neck supple.     Right lower leg: Edema present.     Left lower leg: Edema present.  Skin:    General: Skin is warm and dry.     Findings: Erythema present.  Neurological:     General: No focal deficit present.     Mental Status: He is alert and oriented to person, place, and time.  Psychiatric:        Mood and Affect: Mood normal.        Behavior: Behavior normal.     ED Results / Procedures / Treatments   Labs (all labs ordered are listed, but only abnormal results are displayed) Labs Reviewed  COMPREHENSIVE METABOLIC PANEL - Abnormal; Notable for the following components:      Result Value   Glucose, Bld 116 (*)    BUN 25 (*)    Creatinine, Ser 1.38 (*)    Total Protein 6.1 (*)    GFR, Estimated 48 (*)    All other components within normal limits  BLOOD GAS, VENOUS - Abnormal; Notable for the following components:   pO2, Ven <31 (*)    Bicarbonate 32.0 (*)    Acid-Base Excess 4.7 (*)    All other components within normal limits  CBC WITH DIFFERENTIAL/PLATELET - Abnormal; Notable for the following components:   Hemoglobin 12.8 (*)    Eosinophils Absolute 0.6 (*)    All other components within  normal limits  BRAIN NATRIURETIC PEPTIDE - Abnormal; Notable for the following components:   B Natriuretic Peptide 130.2 (*)    All other components within normal limits  RESP PANEL BY RT-PCR (RSV, FLU A&B, COVID)  RVPGX2  TSH    EKG EKG Interpretation Date/Time:  Sunday May 09 2023 12:20:26 EST Ventricular Rate:  79 PR Interval:    QRS Duration:  162 QT Interval:  405 QTC Calculation: 465 R Axis:   97  Text Interpretation: Atrial fibrillation Right bundle branch block Confirmed by Gloris Manchester ([id number]) on 05/09/2023 12:40:30 PM  Radiology DG Chest  Port 1 View Result Date: 05/09/2023 CLINICAL DATA:  Dyspnea.  Difficulty breathing since last night. EXAM: PORTABLE CHEST 1 VIEW COMPARISON:  04/20/2023 FINDINGS: Stable cardiomediastinal contours. Aortic atherosclerotic calcifications. There is no pleural fluid or interstitial edema. No pneumothorax. No superimposed airspace consolidation. The visualized osseous structures appear unremarkable. IMPRESSION: No acute cardiopulmonary abnormalities. Electronically Signed   By: Signa Kell M.D.   On: 05/09/2023 14:07    Procedures Procedures    Medications Ordered in ED Medications  methylPREDNISolone sodium succinate (SOLU-MEDROL) 40 mg/mL injection 40 mg (has no administration in time range)  amiodarone (PACERONE) tablet 200 mg (has no administration in time range)  levothyroxine (SYNTHROID) tablet 137 mcg (has no administration in time range)  apixaban (ELIQUIS) tablet 2.5 mg (has no administration in time range)  rOPINIRole (REQUIP) tablet 0.75 mg (has no administration in time range)  fluticasone (FLONASE) 50 MCG/ACT nasal spray 2 spray (has no administration in time range)  acetaminophen (TYLENOL) tablet 650 mg (has no administration in time range)    Or  acetaminophen (TYLENOL) suppository 650 mg (has no administration in time range)  traZODone (DESYREL) tablet 25 mg (has no administration in time range)  ondansetron (ZOFRAN)  tablet 4 mg (has no administration in time range)    Or  ondansetron (ZOFRAN) injection 4 mg (has no administration in time range)  albuterol (PROVENTIL) (2.5 MG/3ML) 0.083% nebulizer solution 2.5 mg (has no administration in time range)  azithromycin (ZITHROMAX) 500 mg in sodium chloride 0.9 % 250 mL IVPB (has no administration in time range)  cefTRIAXone (ROCEPHIN) 1 g in sodium chloride 0.9 % 100 mL IVPB (has no administration in time range)  furosemide (LASIX) injection 40 mg (has no administration in time range)  fluticasone furoate-vilanterol (BREO ELLIPTA) 200-25 MCG/ACT 1 puff (has no administration in time range)    And  umeclidinium bromide (INCRUSE ELLIPTA) 62.5 MCG/ACT 1 puff (has no administration in time range)  methylPREDNISolone sodium succinate (SOLU-MEDROL) 125 mg/2 mL injection 125 mg (125 mg Intravenous Given 05/09/23 1258)  ipratropium-albuterol (DUONEB) 0.5-2.5 (3) MG/3ML nebulizer solution 3 mL (3 mLs Nebulization Given 05/09/23 1243)  cefTRIAXone (ROCEPHIN) 1 g in sodium chloride 0.9 % 100 mL IVPB (0 g Intravenous Stopped 05/09/23 1414)  azithromycin (ZITHROMAX) 500 mg in sodium chloride 0.9 % 250 mL IVPB (0 mg Intravenous Stopped 05/09/23 1430)  albuterol (PROVENTIL) (2.5 MG/3ML) 0.083% nebulizer solution 2.5 mg (2.5 mg Nebulization Given 05/09/23 1316)  furosemide (LASIX) injection 40 mg (40 mg Intravenous Given 05/09/23 1422)    ED Course/ Medical Decision Making/ A&P                                 Medical Decision Making Amount and/or Complexity of Data Reviewed Labs: ordered. Radiology: ordered.  Risk Prescription drug management. Decision regarding hospitalization.   This patient presents to the ED for concern of shortness of breath, this involves an extensive number of treatment options, and is a complaint that carries with it a high risk of complications and morbidity.  The differential diagnosis includes COPD exacerbation, pneumonia, CHF exacerbation, URI,  acidosis, anemia, other metabolic derangements   Co morbidities that complicate the patient evaluation  atrial fibrillation, CHF, COPD, CKD, HTN   Additional history obtained:  Additional history obtained from patient's family External records from outside source obtained and reviewed including EMR   Lab Tests:  I Ordered, and personally interpreted labs.  The pertinent results include:  Hemoglobin, no leukocytosis, slight creatinine, normal electrolytes.  BNP is mildly elevated, consistent with prior lab work.   Imaging Studies ordered:  I ordered imaging studies including chest x-ray I independently visualized and interpreted imaging which showed no acute findings I agree with the radiologist interpretation   Cardiac Monitoring: / EKG:  The patient was maintained on a cardiac monitor.  I personally viewed and interpreted the cardiac monitored which showed an underlying rhythm of: Sinus rhythm   Problem List / ED Course / Critical interventions / Medication management  Patient presents for cough over the past week, productive with yellow sputum, in addition to shortness of breath that has worsened since last night.  On arrival in the ED, SpO2 is normal on room air.  Patient has mildly increased work of breathing.  He is able to speak in complete sentences.  On lung auscultation, he has diffuse wheezing.  COPD treatment was initiated.  On bedside ultrasound, patient does have biapical B-lines.  I suspect a CHF component to his recent symptoms as well.  Laboratory workup was initiated.  Patient's exam also notable for erythema, warmth, and swelling to left lower extremity.  He states that he does have a remote injury to this leg.  Per family, redness has worsened over the past few days.  This does raise concern for cellulitis.  Given his increased sputum production, antibiotics initiated for his COPD.  This will also cover for possible cellulitis.  Workup results were unremarkable.   While in the ED, patient did have hypoxia with SpO2 in the 80s on room air.  He was placed on 2 L of supplemental oxygen.  Patient was admitted for further management. I ordered medication including Solu-Medrol, DuoNeb, albuterol, ceftriaxone, azithromycin for COPD exacerbation; Lasix for diuresis Reevaluation of the patient after these medicines showed that the patient improved I have reviewed the patients home medicines and have made adjustments as needed   Social Determinants of Health:  Lives at home with family  CRITICAL CARE Performed by: Gloris Manchester   Total critical care time: 33 minutes  Critical care time was exclusive of separately billable procedures and treating other patients.  Critical care was necessary to treat or prevent imminent or life-threatening deterioration.  Critical care was time spent personally by me on the following activities: development of treatment plan with patient and/or surrogate as well as nursing, discussions with consultants, evaluation of patient's response to treatment, examination of patient, obtaining history from patient or surrogate, ordering and performing treatments and interventions, ordering and review of laboratory studies, ordering and review of radiographic studies, pulse oximetry and re-evaluation of patient's condition.        Final Clinical Impression(s) / ED Diagnoses Final diagnoses:  COPD exacerbation (HCC)  Acute respiratory failure with hypoxia [facility])    Rx / DC Orders ED Discharge Orders     None         Gloris Manchester, MD 05/09/23 1554

## 2023-05-10 ENCOUNTER — Other Ambulatory Visit (HOSPITAL_COMMUNITY): Payer: Medicare HMO

## 2023-05-10 DIAGNOSIS — I480 Paroxysmal atrial fibrillation: Secondary | ICD-10-CM | POA: Diagnosis present

## 2023-05-10 DIAGNOSIS — D689 Coagulation defect, unspecified: Secondary | ICD-10-CM | POA: Diagnosis present

## 2023-05-10 DIAGNOSIS — J439 Emphysema, unspecified: Secondary | ICD-10-CM | POA: Diagnosis present

## 2023-05-10 DIAGNOSIS — Z8249 Family history of ischemic heart disease and other diseases of the circulatory system: Secondary | ICD-10-CM | POA: Diagnosis not present

## 2023-05-10 DIAGNOSIS — I130 Hypertensive heart and chronic kidney disease with heart failure and stage 1 through stage 4 chronic kidney disease, or unspecified chronic kidney disease: Secondary | ICD-10-CM | POA: Diagnosis present

## 2023-05-10 DIAGNOSIS — Z79899 Other long term (current) drug therapy: Secondary | ICD-10-CM | POA: Diagnosis not present

## 2023-05-10 DIAGNOSIS — N1831 Chronic kidney disease, stage 3a: Secondary | ICD-10-CM | POA: Diagnosis present

## 2023-05-10 DIAGNOSIS — M1990 Unspecified osteoarthritis, unspecified site: Secondary | ICD-10-CM | POA: Diagnosis present

## 2023-05-10 DIAGNOSIS — Z7901 Long term (current) use of anticoagulants: Secondary | ICD-10-CM | POA: Diagnosis not present

## 2023-05-10 DIAGNOSIS — L03116 Cellulitis of left lower limb: Secondary | ICD-10-CM | POA: Diagnosis present

## 2023-05-10 DIAGNOSIS — I5032 Chronic diastolic (congestive) heart failure: Secondary | ICD-10-CM | POA: Diagnosis present

## 2023-05-10 DIAGNOSIS — E079 Disorder of thyroid, unspecified: Secondary | ICD-10-CM | POA: Diagnosis present

## 2023-05-10 DIAGNOSIS — R0602 Shortness of breath: Secondary | ICD-10-CM | POA: Diagnosis present

## 2023-05-10 DIAGNOSIS — J9601 Acute respiratory failure with hypoxia: Secondary | ICD-10-CM | POA: Diagnosis present

## 2023-05-10 DIAGNOSIS — Z881 Allergy status to other antibiotic agents status: Secondary | ICD-10-CM | POA: Diagnosis not present

## 2023-05-10 DIAGNOSIS — Z79890 Hormone replacement therapy (postmenopausal): Secondary | ICD-10-CM | POA: Diagnosis not present

## 2023-05-10 DIAGNOSIS — Z87891 Personal history of nicotine dependence: Secondary | ICD-10-CM | POA: Diagnosis not present

## 2023-05-10 DIAGNOSIS — Z888 Allergy status to other drugs, medicaments and biological substances status: Secondary | ICD-10-CM | POA: Diagnosis not present

## 2023-05-10 DIAGNOSIS — J441 Chronic obstructive pulmonary disease with (acute) exacerbation: Secondary | ICD-10-CM | POA: Diagnosis present

## 2023-05-10 DIAGNOSIS — I5031 Acute diastolic (congestive) heart failure: Secondary | ICD-10-CM | POA: Diagnosis not present

## 2023-05-10 LAB — BASIC METABOLIC PANEL
Anion gap: 11 (ref 5–15)
BUN: 29 mg/dL — ABNORMAL HIGH (ref 8–23)
CO2: 25 mmol/L (ref 22–32)
Calcium: 8.8 mg/dL — ABNORMAL LOW (ref 8.9–10.3)
Chloride: 100 mmol/L (ref 98–111)
Creatinine, Ser: 1.32 mg/dL — ABNORMAL HIGH (ref 0.61–1.24)
GFR, Estimated: 50 mL/min — ABNORMAL LOW (ref >60)
Glucose, Bld: 150 mg/dL — ABNORMAL HIGH (ref 70–99)
Potassium: 4.1 mmol/L (ref 3.5–5.1)
Sodium: 136 mmol/L (ref 135–145)

## 2023-05-10 LAB — CBC
HCT: 36.8 % — ABNORMAL LOW (ref 39.0–52.0)
Hemoglobin: 11.9 g/dL — ABNORMAL LOW (ref 13.0–17.0)
MCH: 29.8 pg (ref 26.0–34.0)
MCHC: 32.3 g/dL (ref 30.0–36.0)
MCV: 92 fL (ref 80.0–100.0)
Platelets: 257 10*3/uL (ref 150–400)
RBC: 4 MIL/uL — ABNORMAL LOW (ref 4.22–5.81)
RDW: 13.8 % (ref 11.5–15.5)
WBC: 7.9 10*3/uL (ref 4.0–10.5)
nRBC: 0 % (ref 0.0–0.2)

## 2023-05-10 LAB — PROCALCITONIN: Procalcitonin: <0.1 ng/mL

## 2023-05-10 NOTE — Hospital Course (Addendum)
Samuel Mcconnell is a 89 y.o. male with past medical history significant for COPD not on supplemental oxygen,, paroxysmal atrial fibrillation on Eliquis, hypertension and chronic lower extremity edema on intermittent Lasix presented to hospital with shortness of breath cough with yellow sputum production and diffuse wheezing.  Has been having intermittent edema and takes 1-2 Lasix per day.  In the ED patient was slightly tachypneic.  Initial labs showed no leukocytosis.  BMP was notable for creatinine elevation at 1.3.  BNP elevated at 130.  COVID influenza and RSV was negative.  TSH of 3.2.  Chest x-ray did not show any acute infiltrate.  Patient was then considered for admission to the hospital for acute hypoxic respiratory failure in the setting of acute exacerbation of COPD.    Acute hypoxic respiratory failure-likely due to acute exacerbation of COPD, possible mild CHF exacerbation.  Continue treatment for COPD.   Acute exacerbation of COPD-with diffuse rhonchi, increased cough, hypoxia.  Chest x-ray without clear evidence of acute infection.  Continue supplemental oxygen, incentive spirometry flutter valve.  Continue with IV Solu-Medrol 40 twice daily.  Empirically on Rocephin and Zithromax. Continue home [place], [place], with short acting beta agonist as needed  Lower extremity cellulitis-with warmth, erythema, he has no leukocytosis and is afebrile. Currently on empiric IV Rocephin   Paroxysmal atrial fibrillation-currently in sinus rhythm, continue home amiodarone and Eliquis   Heart failure with preserved EF- Review of last echo 2018 with normal EF, this is presumed due to his chronic lower extremity edema.  He takes Lasix intermittently.  BNP elevated on presentation. Received 40 mg IV Lasix in the emergency department, will continue this daily   CKD stage IIIa-continue to monitor renal function especially while on diuretics.  Currently at baseline. Baseline creatinine around 1.3-1.5.Marland Kitchen

## 2023-05-10 NOTE — Progress Notes (Signed)
PROGRESS NOTE  Samuel Mcconnell WUJ:[id number] DOB: [birth year] DOA: 05/09/2023 PCP: Everrett Coombe, DO   LOS: 0 days   Brief narrative:  Samuel Mcconnell is a 89 y.o. male with past medical history significant for COPD not on supplemental oxygen,, paroxysmal atrial fibrillation on Eliquis, hypertension and chronic lower extremity edema on intermittent Lasix presented to hospital with shortness of breath, cough with yellow sputum production and diffuse wheezing.  Has been having intermittent edema and takes 1-2 Lasix per day.  In the ED, patient was slightly tachypneic.  Initial labs showed no leukocytosis.  BMP was notable for creatinine elevation at 1.3.  BNP elevated at 130.  COVID influenza and RSV was negative.  TSH of 3.2.  Chest x-ray did not show any acute infiltrate.  Patient was then considered for admission to the hospital for acute hypoxic respiratory failure in the setting of acute exacerbation of COPD.       Assessment/Plan: Principal Problem:   Acute respiratory failure with hypoxia (HCC)  Acute hypoxic respiratory failure-likely due to acute exacerbation of COPD, possible mild CHF exacerbation.  Continue treatment for COPD.  Continue diuretics.   Acute exacerbation of COPD-presented with diffuse rhonchi, increased cough, hypoxia.  Chest x-ray without clear evidence of acute infiltrate.  Continue supplemental oxygen, incentive spirometry flutter valve.  Continue with IV Solu-Medrol 40 milligram twice daily.  Empirically on IV Rocephin and Zithromax. Continue home [place], [place], with short acting beta agonist as needed.  Still has cough, wheezing with coarse breath.  Check procalcitonin.  Possible mild left lower extremity cellulitis-with warmth, erythema, he has no leukocytosis and is afebrile. Currently on empiric IV Rocephin.  States that he left leg swelling more than the right, not new.   Paroxysmal atrial fibrillation-currently in sinus rhythm, continue home amiodarone and  Eliquis   Heart failure with preserved EF- Review of last echo 2018 with normal EF, this is presumed due to his chronic lower extremity edema.  He takes Lasix intermittently.  BNP elevated on presentation. Received 40 mg IV Lasix in the emergency department, will continue IV Lasix for now.  Will check 2D echocardiogram.   CKD stage IIIa-continue to monitor renal function especially while on diuretics.  Currently at baseline. Baseline creatinine around 1.3-1.5.Marland Kitchen  DVT prophylaxis: apixaban (ELIQUIS) tablet 2.5 mg Start: 05/09/23 2200 apixaban (ELIQUIS) tablet 2.5 mg   Disposition: Uncertain at this time.  Will get PT OT evaluation.  Likely home with home health  Status is: Observation The patient will require care spanning > 2 midnights and should be moved to inpatient because: Pending clinical improvement, IV antibiotics, supplemental oxygen    Code Status:     Code Status: Full Code  Family Communication: None at bedside  Consultants: None  Procedures: None  Anti-infectives: Rocephin and Zithromax  Anti-infectives (From admission, onward)    Start     Dose/Rate Route Frequency Ordered Stop   05/10/23 1400  azithromycin (ZITHROMAX) 500 mg in sodium chloride 0.9 % 250 mL IVPB        500 mg 250 mL/hr over 60 Minutes Intravenous Every 24 hours 05/09/23 1444     05/10/23 1000  cefTRIAXone (ROCEPHIN) 1 g in sodium chloride 0.9 % 100 mL IVPB        1 g 200 mL/hr over 30 Minutes Intravenous Every 24 hours 05/09/23 1444     05/09/23 1245  cefTRIAXone (ROCEPHIN) 1 g in sodium chloride 0.9 % 100 mL IVPB        1 g 200  mL/hr over 30 Minutes Intravenous  Once 05/09/23 1243 05/09/23 1414   05/09/23 1245  azithromycin (ZITHROMAX) 500 mg in sodium chloride 0.9 % 250 mL IVPB        500 mg 250 mL/hr over 60 Minutes Intravenous  Once 05/09/23 1243 05/09/23 1430        Subjective: Today, patient was seen and examined at bedside.   Objective: Vitals:   05/10/23 0456 05/10/23 0918   BP: 134/71   Pulse: 67   Resp: 18   Temp: 98.4 F (36.9 C)   SpO2: 96% 92%    Intake/Output Summary (Last 24 hours) at 05/10/2023 1047 Last data filed at 05/10/2023 0643 Gross per 24 hour  Intake 950 ml  Output 2350 ml  Net -1400 ml   Filed Weights   05/09/23 1536  Weight: 84.6 kg   Body mass index is 26.01 kg/m.   Physical Exam: GENERAL: Patient is alert awake and oriented. Not in obvious distress.  Elderly male, Communicative, on nasal cannula oxygen, HENT: No scleral pallor or icterus. Pupils equally reactive to light. Oral mucosa is moist NECK: is supple, no gross swelling noted. CHEST:   Diminished breath sounds bilaterally coarse breath sounds with CVS: S1 and S2 heard, no murmur. Regular rate and rhythm.  ABDOMEN: Soft, non-tender, bowel sounds are present. EXTREMITIES: Left lower extremity with edema and mild erythema compared to right. CNS: Cranial nerves are intact. No focal motor deficits. SKIN: warm and dry without rashes.  Data Review: I have personally reviewed the following laboratory data and studies,  CBC: Recent Labs  Lab 05/09/23 1259 05/10/23 0613  WBC 8.7 7.9  NEUTROABS 5.9  --   HGB 12.8* 11.9*  HCT 40.1 36.8*  MCV 92.8 92.0  PLT 254 257   Basic Metabolic Panel: Recent Labs  Lab 05/09/23 1259 05/10/23 0613  NA 136 136  K 4.0 4.1  CL 102 100  CO2 26 25  GLUCOSE 116* 150*  BUN 25* 29*  CREATININE 1.38* 1.32*  CALCIUM 9.0 8.8*   Liver Function Tests: Recent Labs  Lab 05/09/23 1259  AST 32  ALT 23  ALKPHOS 58  BILITOT 0.7  PROT 6.1*  ALBUMIN 3.7   No results for input(s): "LIPASE", "AMYLASE" in the last 168 hours. No results for input(s): "AMMONIA" in the last 168 hours. Cardiac Enzymes: No results for input(s): "CKTOTAL", "CKMB", "CKMBINDEX", "TROPONINI" in the last 168 hours. BNP (last 3 results) Recent Labs    05/09/23 1259  BNP 130.2*    ProBNP (last 3 results) No results for input(s): "PROBNP" in the last 8760  hours.  CBG: No results for input(s): "GLUCAP" in the last 168 hours. Recent Results (from the past 240 hours)  Resp panel by RT-PCR (RSV, Flu A&B, Covid) Anterior Nasal Swab     Status: None   Collection Time: 05/09/23 12:49 PM   Specimen: Anterior Nasal Swab  Result Value Ref Range Status   SARS Coronavirus 2 by RT PCR NEGATIVE NEGATIVE Final    Comment: (NOTE) SARS-CoV-2 target nucleic acids are NOT DETECTED.  The SARS-CoV-2 RNA is generally detectable in upper respiratory specimens during the acute phase of infection. The lowest concentration of SARS-CoV-2 viral copies this assay can detect is 138 copies/mL. A negative result does not preclude SARS-Cov-2 infection and should not be used as the sole basis for treatment or other patient management decisions. A negative result may occur with  improper specimen collection/handling, submission of specimen other than nasopharyngeal swab, presence of  viral mutation(s) within the areas targeted by this assay, and inadequate number of viral copies(<138 copies/mL). A negative result must be combined with clinical observations, patient history, and epidemiological information. The expected result is Negative.  Fact Sheet for Patients:  BloggerCourse.com  Fact Sheet for Healthcare Providers:  SeriousBroker.it  This test is no t yet approved or cleared by the [place] FDA and  has been authorized for detection and/or diagnosis of SARS-CoV-2 by FDA under an Emergency Use Authorization (EUA). This EUA will remain  in effect (meaning this test can be used) for the duration of the COVID-19 declaration under Section 564(b)(1) of the Act, 21 U.S.C.section 360bbb-3(b)(1), unless the authorization is terminated  or revoked sooner.       Influenza A by PCR NEGATIVE NEGATIVE Final   Influenza B by PCR NEGATIVE NEGATIVE Final    Comment: (NOTE) The Xpert Xpress SARS-CoV-2/FLU/RSV plus  assay is intended as an aid in the diagnosis of influenza from Nasopharyngeal swab specimens and should not be used as a sole basis for treatment. Nasal washings and aspirates are unacceptable for Xpert Xpress SARS-CoV-2/FLU/RSV testing.  Fact Sheet for Patients: BloggerCourse.com  Fact Sheet for Healthcare Providers: SeriousBroker.it  This test is not yet approved or cleared by the [place] FDA and has been authorized for detection and/or diagnosis of SARS-CoV-2 by FDA under an Emergency Use Authorization (EUA). This EUA will remain in effect (meaning this test can be used) for the duration of the COVID-19 declaration under Section 564(b)(1) of the Act, 21 U.S.C. section 360bbb-3(b)(1), unless the authorization is terminated or revoked.     Resp Syncytial Virus by PCR NEGATIVE NEGATIVE Final    Comment: (NOTE) Fact Sheet for Patients: BloggerCourse.com  Fact Sheet for Healthcare Providers: SeriousBroker.it  This test is not yet approved or cleared by the [place] FDA and has been authorized for detection and/or diagnosis of SARS-CoV-2 by FDA under an Emergency Use Authorization (EUA). This EUA will remain in effect (meaning this test can be used) for the duration of the COVID-19 declaration under Section 564(b)(1) of the Act, 21 U.S.C. section 360bbb-3(b)(1), unless the authorization is terminated or revoked.  Performed at [facility], [address] [place], [place] [address]      Studies: DG Chest Port 1 View Result Date: 05/09/2023 CLINICAL DATA:  Dyspnea.  Difficulty breathing since last night. EXAM: PORTABLE CHEST 1 VIEW COMPARISON:  04/20/2023 FINDINGS: Stable cardiomediastinal contours. Aortic atherosclerotic calcifications. There is no pleural fluid or interstitial edema. No pneumothorax. No superimposed airspace consolidation. The  visualized osseous structures appear unremarkable. IMPRESSION: No acute cardiopulmonary abnormalities. Electronically Signed   By: Signa Kell M.D.   On: 05/09/2023 14:07      Joycelyn Das, MD  [facility] 05/10/2023  If 7PM-7AM, please contact night-coverage

## 2023-05-11 ENCOUNTER — Encounter (HOSPITAL_COMMUNITY): Payer: Self-pay | Admitting: Internal Medicine

## 2023-05-11 ENCOUNTER — Inpatient Hospital Stay (HOSPITAL_COMMUNITY): Payer: Medicare HMO

## 2023-05-11 DIAGNOSIS — I5031 Acute diastolic (congestive) heart failure: Secondary | ICD-10-CM

## 2023-05-11 DIAGNOSIS — J9601 Acute respiratory failure with hypoxia: Secondary | ICD-10-CM | POA: Diagnosis not present

## 2023-05-11 LAB — BASIC METABOLIC PANEL
Anion gap: 10 (ref 5–15)
BUN: 39 mg/dL — ABNORMAL HIGH (ref 8–23)
CO2: 25 mmol/L (ref 22–32)
Calcium: 8.5 mg/dL — ABNORMAL LOW (ref 8.9–10.3)
Chloride: 98 mmol/L (ref 98–111)
Creatinine, Ser: 1.46 mg/dL — ABNORMAL HIGH (ref 0.61–1.24)
GFR, Estimated: 45 mL/min — ABNORMAL LOW (ref >60)
Glucose, Bld: 147 mg/dL — ABNORMAL HIGH (ref 70–99)
Potassium: 3.6 mmol/L (ref 3.5–5.1)
Sodium: 133 mmol/L — ABNORMAL LOW (ref 135–145)

## 2023-05-11 LAB — CBC
HCT: 39 % (ref 39.0–52.0)
Hemoglobin: 12.4 g/dL — ABNORMAL LOW (ref 13.0–17.0)
MCH: 29.7 pg (ref 26.0–34.0)
MCHC: 31.8 g/dL (ref 30.0–36.0)
MCV: 93.5 fL (ref 80.0–100.0)
Platelets: 275 10*3/uL (ref 150–400)
RBC: 4.17 MIL/uL — ABNORMAL LOW (ref 4.22–5.81)
RDW: 13.7 % (ref 11.5–15.5)
WBC: 17.8 10*3/uL — ABNORMAL HIGH (ref 4.0–10.5)
nRBC: 0 % (ref 0.0–0.2)

## 2023-05-11 LAB — ECHOCARDIOGRAM COMPLETE
Area-P 1/2: 5.02 cm2
Height: 71 in
S' Lateral: 2.8 cm
Weight: 2984.15 [oz_av]

## 2023-05-11 LAB — MAGNESIUM: Magnesium: 2.1 mg/dL (ref 1.7–2.4)

## 2023-05-11 MED ORDER — PERFLUTREN LIPID MICROSPHERE
1.0000 mL | INTRAVENOUS | Status: DC | PRN
  Administered 2023-05-11: 1 mL via INTRAVENOUS

## 2023-05-11 MED ORDER — BREZTRI AEROSPHERE 160-9-4.8 MCG/ACT IN AERO: 2.0000 | INHALATION_SPRAY | Freq: Two times a day (BID) | RESPIRATORY_TRACT | 1 refills | Status: AC

## 2023-05-11 MED ORDER — PREDNISONE 10 MG PO TABS: ORAL_TABLET | ORAL | 0 refills | Status: DC

## 2023-05-11 MED ORDER — AMOXICILLIN-POT CLAVULANATE 875-125 MG PO TABS: 1.0000 | ORAL_TABLET | Freq: Two times a day (BID) | ORAL | 0 refills | Status: AC

## 2023-05-11 NOTE — Discharge Summary (Signed)
Physician Discharge Summary  Samuel Mcconnell JXB:[id number] DOB: [birth year] DOA: 05/09/2023  PCP: Everrett Coombe, DO  Admit date: 05/09/2023 Discharge date: 05/11/2023  Admitted From: Home  Discharge disposition: Home with home health   Recommendations for Outpatient Follow-Up:   Follow up with your primary care provider in one week.  Check CBC, BMP, magnesium in the next visit  Discharge Diagnosis:   Principal Problem:   Acute respiratory failure with hypoxia [facility])  Discharge Condition: Improved.  Diet recommendation: Regular.  Wound care: None.  Code status: Full.   History of Present Illness:   Samuel Mcconnell is a 89 y.o. male with past medical history significant for COPD not on supplemental oxygen,, paroxysmal atrial fibrillation on Eliquis, hypertension and chronic lower extremity edema on intermittent Lasix presented to hospital with shortness of breath, cough with yellow sputum production and diffuse wheezing.  Has been having intermittent edema and takes 1-2 Lasix per day.  In the ED, patient was slightly tachypneic.  Initial labs showed no leukocytosis.  BMP was notable for creatinine elevation at 1.3.  BNP elevated at 130.  COVID influenza and RSV was negative.  TSH of 3.2.  Chest x-ray did not show any acute infiltrate.  Patient was then considered for admission to the hospital for acute hypoxic respiratory failure in the setting of acute exacerbation of COPD.    Hospital Course:   Following conditions were addressed during hospitalization as listed below,  Acute hypoxic respiratory failure-likely due to acute exacerbation of COPD, possible mild CHF exacerbation.  Continue treatment for COPD.  Continue diuretics discharge..   Acute exacerbation of COPD-presented with diffuse rhonchi, increased cough, hypoxia.  Chest x-ray without clear evidence of acute infiltrate.  Received supplemental oxygen, incentive spirometry flutter valve, Solu-Medrol 40 milligram twice daily.   Empirically was put on IV Rocephin and Zithromax. Continue home [place], [place], with short acting beta agonist as needed on discharge.Marland Kitchen  Has improved at this time.  Will consider Augmentin and prednisone short taper on discharge.  Patient did not require any supplemental oxygen prior to discharge.   Possible mild left lower extremity cellulitis-with warmth, erythema, he has no leukocytosis and is afebrile.  Will continue Augmentin on discharge  Paroxysmal atrial fibrillation-currently in sinus rhythm, continue home amiodarone and Eliquis   Heart failure with preserved EF- Review of last echo 2018 with normal EF, this is presumed due to his chronic lower extremity edema.  He takes Lasix intermittently.  BNP elevated on presentation.  Continue Lasix on discharge.  2D echocardiogram done 05/11/2023 showed LV ejection fraction of 70 to 75% with LVH.  CKD stage IIIa-continue to monitor renal function especially while on diuretics.  Currently at baseline. Baseline creatinine around 1.3-1.5.   Disposition.  At this time, patient is stable for disposition home with outpatient PCP follow-up.  Medical Consultants:   None.  Procedures:    None Subjective:   Today, patient was seen and examined at bedside.  Denies any shortness of breath, dyspnea, chest pain.  Was able to ambulate without any need for oxygen.  Feels much better.  Discharge Exam:   Vitals:   05/11/23 0505 05/11/23 0842  BP: (!) 151/75   Pulse: 75   Resp: 17   Temp: 98 F (36.7 C)   SpO2: 92% 93%   Vitals:   05/10/23 1329 05/10/23 1937 05/11/23 0505 05/11/23 0842  BP: (!) 112/52 135/82 (!) 151/75   Pulse: 72 72 75   Resp: 20 17 17    Temp: 97.7  F (36.5 C) 98 F (36.7 C) 98 F (36.7 C)   TempSrc: Oral     SpO2: 95% 96% 92% 93%  Weight:      Height:        General: Alert awake, not in obvious distress, elderly male, Communicative, on room air. HENT: pupils equally reacting to light,  No scleral pallor or icterus  noted. Oral mucosa is moist.  Chest:   Diminished breath sounds bilaterally.  CVS: S1 &S2 heard. No murmur.  Regular rate and rhythm. Abdomen: Soft, nontender, nondistended.  Bowel sounds are heard.   Extremities: No cyanosis, clubbing or mild left lower extremity edema.  Peripheral pulses are palpable. Psych: Alert, awake and oriented, normal mood CNS:  No cranial nerve deficits.  Power equal in all extremities.   Skin: Warm and dry.  No rashes noted.  The results of significant diagnostics from this hospitalization (including imaging, microbiology, ancillary and laboratory) are listed below for reference.     Diagnostic Studies:   DG Chest Port 1 View Result Date: 05/09/2023 CLINICAL DATA:  Dyspnea.  Difficulty breathing since last night. EXAM: PORTABLE CHEST 1 VIEW COMPARISON:  04/20/2023 FINDINGS: Stable cardiomediastinal contours. Aortic atherosclerotic calcifications. There is no pleural fluid or interstitial edema. No pneumothorax. No superimposed airspace consolidation. The visualized osseous structures appear unremarkable. IMPRESSION: No acute cardiopulmonary abnormalities. Electronically Signed   By: Signa Kell M.D.   On: 05/09/2023 14:07     Labs:   Basic Metabolic Panel: Recent Labs  Lab 05/09/23 1259 05/10/23 0613 05/11/23 0539  NA 136 136 133*  K 4.0 4.1 3.6  CL 102 100 98  CO2 26 25 25   GLUCOSE 116* 150* 147*  BUN 25* 29* 39*  CREATININE 1.38* 1.32* 1.46*  CALCIUM 9.0 8.8* 8.5*  MG  --   --  2.1   GFR Estimated Creatinine Clearance: 33.7 mL/min (A) (by C-G formula based on SCr of 1.46 mg/dL (H)). Liver Function Tests: Recent Labs  Lab 05/09/23 1259  AST 32  ALT 23  ALKPHOS 58  BILITOT 0.7  PROT 6.1*  ALBUMIN 3.7   No results for input(s): "LIPASE", "AMYLASE" in the last 168 hours. No results for input(s): "AMMONIA" in the last 168 hours. Coagulation profile No results for input(s): "INR", "PROTIME" in the last 168 hours.  CBC: Recent Labs   Lab 05/09/23 1259 05/10/23 0613 05/11/23 0539  WBC 8.7 7.9 17.8*  NEUTROABS 5.9  --   --   HGB 12.8* 11.9* 12.4*  HCT 40.1 36.8* 39.0  MCV 92.8 92.0 93.5  PLT 254 257 275   Cardiac Enzymes: No results for input(s): "CKTOTAL", "CKMB", "CKMBINDEX", "TROPONINI" in the last 168 hours. BNP: Invalid input(s): "POCBNP" CBG: No results for input(s): "GLUCAP" in the last 168 hours. D-Dimer No results for input(s): "DDIMER" in the last 72 hours. Hgb A1c No results for input(s): "HGBA1C" in the last 72 hours. Lipid Profile No results for input(s): "CHOL", "HDL", "LDLCALC", "TRIG", "CHOLHDL", "LDLDIRECT" in the last 72 hours. Thyroid function studies Recent Labs    05/09/23 1300  TSH 3.261   Anemia work up No results for input(s): "VITAMINB12", "FOLATE", "FERRITIN", "TIBC", "IRON", "RETICCTPCT" in the last 72 hours. Microbiology Recent Results (from the past 240 hours)  Resp panel by RT-PCR (RSV, Flu A&B, Covid) Anterior Nasal Swab     Status: None   Collection Time: 05/09/23 12:49 PM   Specimen: Anterior Nasal Swab  Result Value Ref Range Status   SARS Coronavirus 2 by RT  PCR NEGATIVE NEGATIVE Final    Comment: (NOTE) SARS-CoV-2 target nucleic acids are NOT DETECTED.  The SARS-CoV-2 RNA is generally detectable in upper respiratory specimens during the acute phase of infection. The lowest concentration of SARS-CoV-2 viral copies this assay can detect is 138 copies/mL. A negative result does not preclude SARS-Cov-2 infection and should not be used as the sole basis for treatment or other patient management decisions. A negative result may occur with  improper specimen collection/handling, submission of specimen other than nasopharyngeal swab, presence of viral mutation(s) within the areas targeted by this assay, and inadequate number of viral copies(<138 copies/mL). A negative result must be combined with clinical observations, patient history, and  epidemiological information. The expected result is Negative.  Fact Sheet for Patients:  BloggerCourse.com  Fact Sheet for Healthcare Providers:  SeriousBroker.it  This test is no t yet approved or cleared by the [place] FDA and  has been authorized for detection and/or diagnosis of SARS-CoV-2 by FDA under an Emergency Use Authorization (EUA). This EUA will remain  in effect (meaning this test can be used) for the duration of the COVID-19 declaration under Section 564(b)(1) of the Act, 21 U.S.C.section 360bbb-3(b)(1), unless the authorization is terminated  or revoked sooner.       Influenza A by PCR NEGATIVE NEGATIVE Final   Influenza B by PCR NEGATIVE NEGATIVE Final    Comment: (NOTE) The Xpert Xpress SARS-CoV-2/FLU/RSV plus assay is intended as an aid in the diagnosis of influenza from Nasopharyngeal swab specimens and should not be used as a sole basis for treatment. Nasal washings and aspirates are unacceptable for Xpert Xpress SARS-CoV-2/FLU/RSV testing.  Fact Sheet for Patients: BloggerCourse.com  Fact Sheet for Healthcare Providers: SeriousBroker.it  This test is not yet approved or cleared by the [place] FDA and has been authorized for detection and/or diagnosis of SARS-CoV-2 by FDA under an Emergency Use Authorization (EUA). This EUA will remain in effect (meaning this test can be used) for the duration of the COVID-19 declaration under Section 564(b)(1) of the Act, 21 U.S.C. section 360bbb-3(b)(1), unless the authorization is terminated or revoked.     Resp Syncytial Virus by PCR NEGATIVE NEGATIVE Final    Comment: (NOTE) Fact Sheet for Patients: BloggerCourse.com  Fact Sheet for Healthcare Providers: SeriousBroker.it  This test is not yet approved or cleared by the [place] FDA and has been  authorized for detection and/or diagnosis of SARS-CoV-2 by FDA under an Emergency Use Authorization (EUA). This EUA will remain in effect (meaning this test can be used) for the duration of the COVID-19 declaration under Section 564(b)(1) of the Act, 21 U.S.C. section 360bbb-3(b)(1), unless the authorization is terminated or revoked.  Performed at [facility], [address], [place], [place] [address]      Discharge Instructions:   Discharge Instructions     Call MD for:  temperature >100.4   Complete by: As directed    Diet general   Complete by: As directed    Discharge instructions   Complete by: As directed    Follow-up with your primary care provider in 1 week.  Complete course of antibiotic and prednisone.  Seek medical attention for worsening symptoms.  No overexertion.  Check blood work in the next visit.  Continue incentive spirometry at home.   Increase activity slowly   Complete by: As directed       Allergies as of 05/11/2023       Reactions   Clindamycin/lincomycin Other (See  Comments)   Patient "felt weird"   Dabigatran Etexilate Mesylate Other (See Comments)   "felt weird" (name brand Pradaxa)   Doxycycline Other (See Comments)   Dyspepsia        Medication List     TAKE these medications    acetaminophen 500 MG tablet Commonly known as: TYLENOL Take 500 mg by mouth daily as needed for mild pain (pain score 1-3), moderate pain (pain score 4-6) or headache.   albuterol 108 (90 Base) MCG/ACT inhaler Commonly known as: VENTOLIN HFA Inhale 2 puffs into the lungs every 6 (six) hours as needed for wheezing or shortness of breath.   AMBULATORY NON FORMULARY MEDICATION Please provide toilet riser/elevator with arms.  Diagnosis: Generalized weakness R53.1   amiodarone 200 MG tablet Commonly known as: PACERONE Take 1 tablet (200 mg total) by mouth daily.   amoxicillin-clavulanate 875-125 MG tablet Commonly known as:  AUGMENTIN Take 1 tablet by mouth 2 (two) times daily for 3 days.   Breztri Aerosphere 160-9-4.8 MCG/ACT Aero Generic drug: Budeson-Glycopyrrol-Formoterol Inhale 2 puffs into the lungs 2 (two) times daily.   diphenhydramine-acetaminophen 25-500 MG Tabs tablet Commonly known as: TYLENOL PM Take 1 tablet by mouth at bedtime as needed (for sleeplessness).   Eliquis 2.5 MG Tabs tablet Generic drug: apixaban TAKE 1 TABLET TWICE DAILY (DOSE CHANGE) What changed: See the new instructions.   fluticasone 50 MCG/ACT nasal spray Commonly known as: FLONASE Place 2 sprays into both nostrils daily. What changed:  when to take this reasons to take this   furosemide 20 MG tablet Commonly known as: LASIX TAKE 1 TABLET EVERY DAY AS NEEDED FOR EDEMA What changed: See the new instructions.   levothyroxine 137 MCG tablet Commonly known as: SYNTHROID TAKE 1 TABLET EVERY DAY BEFORE BREAKFAST (NEED LABS AND MD APPOINTMENT) What changed: See the new instructions.   multivitamin capsule Take 1 capsule by mouth daily with breakfast.   NON FORMULARY Take 2 tablets by mouth See admin instructions. SuperBeets Heart Chews- Chew 2 tablets by mouth once a day   predniSONE 10 MG tablet Commonly known as: DELTASONE Take 4 tablets (40 mg) daily for 2 days, then, Take 3 tablets (30 mg) daily for 2 days, then, Take 2 tablets (20 mg) daily for 2 days, then, Take 1 tablets (10 mg) daily for 1 days, then stop   rOPINIRole 0.25 MG tablet Commonly known as: REQUIP Take 3 tablets (0.75 mg total) by mouth at bedtime.   triamcinolone ointment 0.1 % Commonly known as: KENALOG Apply 1 Application topically 2 (two) times daily as needed (irritation).   Vitamin D3 125 MCG (5000 UT) Tabs Take 5,000 Units by mouth daily with lunch.   Voltaren 1 % Gel Generic drug: diclofenac Sodium Apply 2 g topically 4 (four) times daily as needed (pain).   zinc gluconate 50 MG tablet Take 50 mg by mouth daily with lunch.           Time coordinating discharge: 39 minutes  Signed:  Tabius Rood  [facility] 05/11/2023, 11:49 AM

## 2023-05-11 NOTE — Plan of Care (Signed)

## 2023-05-11 NOTE — Evaluation (Signed)
Physical Therapy Evaluation Patient Details Name: Samuel Mcconnell MRN: [id number] DOB: 1927/05/16 Today's Date: 05/11/2023  History of Present Illness  Samuel Mcconnell is a 89 y.o. male presented to hospital with shortness of breath, cough with yellow sputum production and diffuse wheezing.Chest x-ray did not show any acute infiltrate,  acute hypoxic respiratory failure in the setting of acute exacerbation of COPD.  PMH: COPD not on supplemental oxygen, paroxysmal atrial fibrillation on Eliquis, hypertension and chronic lower extremity edema on intermittent Lasix  Clinical Impression  Pt admitted with above diagnosis.  Pt currently with functional limitations due to the deficits listed below (see PT Problem List). Pt will benefit from acute skilled PT to increase their independence and safety with mobility to allow discharge.     The patient is eager to ambulate and to  return home. Patient uses a rollator at baseline, resides with  spouse in [place].  Patient ambulated x 100', SPO2 on RA 89% lowest, 95% resting.        If plan is discharge home, recommend the following: A little help with walking and/or transfers;A little help with bathing/dressing/bathroom;Help with stairs or ramp for entrance;Assist for transportation   Can travel by private vehicle        Equipment Recommendations None recommended by PT  Recommendations for Other Services       Functional Status Assessment Patient has had a recent decline in their functional status and demonstrates the ability to make significant improvements in function in a reasonable and predictable amount of time.     Precautions / Restrictions Precautions Precautions: Fall Precaution/Restrictions Comments: monitor sats Restrictions Weight Bearing Restrictions Per Provider Order: No      Mobility  Bed Mobility Overal bed mobility: Modified Independent                  Transfers Overall transfer level: Needs assistance Equipment used:  Rolling walker (2 wheels) Transfers: Sit to/from Stand Sit to Stand: Contact guard assist           General transfer comment: wants to stand and steady  initiallyu    Ambulation/Gait Ambulation/Gait assistance: Supervision Gait Distance (Feet): 100 Feet Assistive device: Rolling walker (2 wheels) Gait Pattern/deviations: Step-through pattern Gait velocity: decr Gait velocity interpretation: <1.31 ft/sec, indicative of household ambulator   General Gait Details: gait slow, wide base  Stairs            Wheelchair Mobility     Tilt Bed    Modified Rankin (Stroke Patients Only)       Balance Overall balance assessment: Mild deficits observed, not formally tested                                           Pertinent Vitals/Pain Pain Assessment Pain Assessment: No/denies pain    Home Living Family/patient expects to be discharged to:: Private residence Living Arrangements: Spouse/significant other Available Help at Discharge: Family;Available PRN/intermittently Type of Home: Independent living facility Home Access: Level entry       Home Layout: One level Home Equipment: Rollator (4 wheels);Shower seat - built in;Grab bars - tub/shower      Prior Function Prior Level of Function : Independent/Modified Independent             Mobility Comments: uses rollator  for mobility ADLs Comments: dtr  checks meds. gets groceries     Extremity/Trunk Assessment  Upper Extremity Assessment Upper Extremity Assessment: Overall WFL for tasks assessed    Lower Extremity Assessment Lower Extremity Assessment: Generalized weakness    Cervical / Trunk Assessment Cervical / Trunk Assessment: Normal  Communication   Communication Factors Affecting Communication: Hearing impaired    Cognition Arousal: Alert Behavior During Therapy: WFL for tasks assessed/performed   PT - Cognitive impairments: No apparent impairments                                  Cueing       General Comments      Exercises     Assessment/Plan    PT Assessment Patient needs continued PT services  PT Problem List Decreased strength;Decreased mobility;Decreased activity tolerance;Decreased knowledge of use of DME;Decreased knowledge of precautions       PT Treatment Interventions DME instruction;Therapeutic activities;Gait training;Therapeutic exercise;Patient/family education;Functional mobility training    PT Goals (Current goals can be found in the Care Plan section)  Acute Rehab PT Goals Patient Stated Goal: go home PT Goal Formulation: With patient Time For Goal Achievement: 05/25/23 Potential to Achieve Goals: Good    Frequency Min 1X/week     Co-evaluation               AM-PAC PT "6 Clicks" Mobility  Outcome Measure Help needed turning from your back to your side while in a flat bed without using bedrails?: None Help needed moving from lying on your back to sitting on the side of a flat bed without using bedrails?: None Help needed moving to and from a bed to a chair (including a wheelchair)?: A Little Help needed standing up from a chair using your arms (e.g., wheelchair or bedside chair)?: A Little Help needed to walk in hospital room?: A Little Help needed climbing 3-5 steps with a railing? : A Little 6 Click Score: 20    End of Session Equipment Utilized During Treatment: Gait belt Activity Tolerance: Patient tolerated treatment well Patient left: in chair;with call bell/phone within reach;with chair alarm set Nurse Communication: Mobility status PT Visit Diagnosis: Unsteadiness on feet (R26.81);Difficulty in walking, not elsewhere classified (R26.2)    Time: 1610-9604 PT Time Calculation (min) (ACUTE ONLY): 18 min   Charges:   PT Evaluation $PT Eval Low Complexity: 1 Low   PT General Charges $$ ACUTE PT VISIT: 1 Visit         Blanchard Kelch PT Acute Rehabilitation Services Office  [phone] Weekend [phone]   Rada Hay 05/11/2023, 9:55 AM

## 2023-05-11 NOTE — TOC CM/SW Note (Signed)

## 2023-05-11 NOTE — TOC Transition Note (Signed)
Transition of Care [facility]) - Discharge Note   Patient Details  Name: Samuel Mcconnell MRN: [id number] Date of Birth: 1927/09/14  [facility]) CM/SW Contact:  Otelia Santee, LCSW Phone Number: 05/11/2023, 10:49 AM   Clinical Narrative:    Met with pt at bedside to discuss recommendation for HHPT. Pt shares he has had HH in the past and is agreeable to having services arranged. Spoke with pt's daughter, [name] and confirmed plans. No preference for HHA. HHPT has been arranged with [facility]. HH orders are in place. Pt's daughter to provide transportation for pt at discharge.    Final next level of care: Home w Home Health Services Barriers to Discharge: No Barriers Identified   Patient Goals and CMS Choice Patient states their goals for this hospitalization and ongoing recovery are:: To return home CMS Medicare.gov Compare Post Acute Care list provided to:: Patient Choice offered to / list presented to : Patient [place] ownership interest in [facility].provided to::  (NA)    Discharge Placement                       Discharge Plan and Services Additional resources added to the After Visit Summary for                  DME Arranged: N/A DME Agency: NA       HH Arranged: PT HH Agency: [facility] Date [facility] Agency Contacted: 05/11/23 Time HH Agency Contacted: 1049 Representative spoke with at [facility] Agency: Cindie  Social Drivers of Health (SDOH) Interventions SDOH Screenings   Food Insecurity: No Food Insecurity (05/09/2023)  Housing: Low Risk  (05/09/2023)  Transportation Needs: No Transportation Needs (05/09/2023)  Utilities: Not At Risk (05/09/2023)  Alcohol Screen: Low Risk  (03/30/2023)  Depression (PHQ2-9): Low Risk  (11/25/2022)  Financial Resource Strain: Low Risk  (03/30/2023)  Physical Activity: Inactive (03/30/2023)  Social Connections: Socially Integrated (05/09/2023)  Stress: No Stress Concern Present (03/30/2023)  Tobacco Use: Medium  Risk (04/20/2023)  Health Literacy: Inadequate Health Literacy (11/25/2022)     Readmission Risk Interventions    05/11/2023   10:47 AM  Readmission Risk Prevention Plan  Post Dischage Appt Complete  Medication Screening Complete  Transportation Screening Complete

## 2023-05-12 ENCOUNTER — Telehealth: Payer: Self-pay | Admitting: *Deleted

## 2023-05-12 NOTE — Transitions of Care (Post Inpatient/ED Visit) (Signed)
05/12/2023  Name: Samuel Mcconnell MRN: [id number] DOB: 02-03-1928  Today's TOC FU Call Status: Today's TOC FU Call Status:: Successful TOC FU Call Completed TOC FU Call Complete Date: 05/12/23 Patient's Name and Date of Birth confirmed.  Transition Care Management Follow-up Telephone Call Date of Discharge: 05/11/23 Discharge Facility: [facility]) Type of Discharge: Inpatient Admission Primary Inpatient Discharge Diagnosis:: Acute respiratory failure with hypoxia How have you been since you were released from the hospital?: Better (still has a little cough) Any questions or concerns?: No  Items Reviewed: Did you receive and understand the discharge instructions provided?: Yes Medications obtained,verified, and reconciled?: Yes (Medications Reviewed) Any new allergies since your discharge?: No Dietary orders reviewed?: No Do you have support at home?: Yes People in Home: spouse Name of Support/Comfort Primary Source: Samuel Mcconnell wife and daughter Samuel Mcconnell  Medications Reviewed Today: Medications Reviewed Today     Reviewed by Luella Cook, RN (Case Manager) on 05/12/23 at 1410  Med List Status: <None>   Medication Order Taking? Sig Documenting Provider Last Dose Status Informant  acetaminophen (TYLENOL) 500 MG tablet [id number] Yes Take 500 mg by mouth daily as needed for mild pain (pain score 1-3), moderate pain (pain score 4-6) or headache. [provider] Taking Active Self  albuterol (VENTOLIN HFA) 108 (90 Base) MCG/ACT inhaler [id number] Yes Inhale 2 puffs into the lungs every 6 (six) hours as needed for wheezing or shortness of breath. Everrett Coombe, DO Taking Active Self  AMBULATORY NON FORMULARY MEDICATION [id number] Yes Please provide toilet riser/elevator with arms.  Diagnosis: Generalized weakness R53.1 Everrett Coombe, DO Taking Active   amiodarone (PACERONE) 200 MG tablet [id number] Yes Take 1 tablet (200 mg total) by mouth daily. Croitoru, Mihai, MD Taking  Active Self  amoxicillin-clavulanate (AUGMENTIN) 875-125 MG tablet [id number] Yes Take 1 tablet by mouth 2 (two) times daily for 3 days. Pokhrel, Laxman, MD Taking Active   apixaban (ELIQUIS) 2.5 MG TABS tablet [id number] Yes TAKE 1 TABLET TWICE DAILY (DOSE CHANGE)  Patient taking differently: Take 2.5 mg by mouth in the morning and at bedtime.   Croitoru, Mihai, MD Taking Active Self  Budeson-Glycopyrrol-Formoterol (BREZTRI AEROSPHERE) 160-9-4.8 MCG/ACT AERO [id number] Yes Inhale 2 puffs into the lungs 2 (two) times daily. Pokhrel, Laxman, MD Taking Active   Cholecalciferol (VITAMIN D3) 125 MCG (5000 UT) TABS [id number] Yes Take 5,000 Units by mouth daily with lunch. [provider] Taking Active Self  diclofenac Sodium (VOLTAREN) 1 % GEL [id number] Yes Apply 2 g topically 4 (four) times daily as needed (pain). [provider] Taking Active Self  diphenhydramine-acetaminophen (TYLENOL PM) 25-500 MG TABS tablet [id number]  Take 1 tablet by mouth at bedtime as needed (for sleeplessness). [provider]  Active Self  fluticasone (FLONASE) 50 MCG/ACT nasal spray [id number]  Place 2 sprays into both nostrils daily.  Patient taking differently: Place 2 sprays into both nostrils daily as needed for allergies or rhinitis.   Everrett Coombe, DO  Expired 05/09/23 2359 Self  furosemide (LASIX) 20 MG tablet [id number] Yes TAKE 1 TABLET EVERY DAY AS NEEDED FOR EDEMA  Patient taking differently: Take 20-40 mg by mouth in the morning.   Everrett Coombe, DO Taking Active Self  levothyroxine (SYNTHROID) 137 MCG tablet [id number] Yes TAKE 1 TABLET EVERY DAY BEFORE BREAKFAST (NEED LABS AND MD APPOINTMENT)  Patient taking differently: Take 137 mcg by mouth daily before breakfast.   Everrett Coombe, DO Taking Active Self  Multiple Vitamin (MULTIVITAMIN) capsule [id number] Yes Take 1 capsule by  mouth daily with breakfast. [provider] Taking Active Self  NON FORMULARY [id number] Yes Take 2  tablets by mouth See admin instructions. SuperBeets Heart Chews- Chew 2 tablets by mouth once a day [provider] Taking Active Self  predniSONE (DELTASONE) 10 MG tablet [id number] Yes Take 4 tablets (40 mg) daily for 2 days, then, Take 3 tablets (30 mg) daily for 2 days, then, Take 2 tablets (20 mg) daily for 2 days, then, Take 1 tablets (10 mg) daily for 1 days, then stop Pokhrel, Laxman, MD Taking Active   rOPINIRole (REQUIP) 0.25 MG tablet [id number]  Take 3 tablets (0.75 mg total) by mouth at bedtime. Everrett Coombe, DO  Expired 05/09/23 2359 Self  triamcinolone ointment (KENALOG) 0.1 % [id number] Yes Apply 1 Application topically 2 (two) times daily as needed (irritation). [provider] Taking Active Self  zinc gluconate 50 MG tablet [id number] Yes Take 50 mg by mouth daily with lunch. [provider] Taking Active Self            Home Care and Equipment/Supplies: Were Home Health Services Ordered?: Yes Name of Home Health Agency:: [facility] Has Agency set up a time to come to your home?: No EMR reviewed for Home Health Orders: Orders present/patient has not received call (refer to CM for follow-up) (RN gave Samuel Mcconnell number to follow up outreach) Any new equipment or medical supplies ordered?: NA  Functional Questionnaire: Do you need assistance with bathing/showering or dressing?: No Do you need assistance with meal preparation?: Yes Do you need assistance with eating?: No Do you have difficulty maintaining continence: No Do you need assistance with getting out of bed/getting out of a chair/moving?: No Do you have difficulty managing or taking your medications?: No  Follow up appointments reviewed: PCP Follow-up appointment confirmed?: Yes Date of PCP follow-up appointment?: 05/19/23 Follow-up Provider: DR Everrett Coombe Specialist [facility] Follow-up appointment confirmed?: NA Do you need transportation to your follow-up appointment?: No Do you understand  care options if your condition(s) worsen?: Yes-patient verbalized understanding  SDOH Interventions Today    Flowsheet Row Most Recent Value  SDOH Interventions   Food Insecurity Interventions Intervention Not Indicated  Housing Interventions Intervention Not Indicated  Transportation Interventions Intervention Not Indicated, Patient Resources (Friends/Family)  Utilities Interventions Intervention Not Indicated      Interventions Today    Flowsheet Row Most Recent Value  Chronic Disease   Chronic disease during today's visit Chronic Obstructive Pulmonary Disease (COPD)  [Acute Respiratory failure with Hypoxia]  General Interventions   General Interventions Discussed/Reviewed General Interventions Discussed, General Interventions Reviewed, Doctor Visits, Referral to Nurse  [Declined referral to Case Manager]  Doctor Visits Discussed/Reviewed Doctor Visits Discussed, Doctor Visits Reviewed, PCP  PCP/Specialist Visits Compliance with follow-up visit  Education Interventions   Education Provided Provided Education  Provided Verbal Education On Insurance Plans  [SW and Care management services]  Pharmacy Interventions   Pharmacy Dicussed/Reviewed Pharmacy Topics Discussed, Pharmacy Topics Reviewed     Declined Care Coordination services  Gean Maidens BSN RN [facility] Health Care Management Coordinator [email or web address] Direct Dial: [phone]  Fax: [phone] Website: [email or web address]

## 2023-05-15 DIAGNOSIS — M1990 Unspecified osteoarthritis, unspecified site: Secondary | ICD-10-CM | POA: Diagnosis not present

## 2023-05-15 DIAGNOSIS — I480 Paroxysmal atrial fibrillation: Secondary | ICD-10-CM | POA: Diagnosis not present

## 2023-05-15 DIAGNOSIS — J9601 Acute respiratory failure with hypoxia: Secondary | ICD-10-CM | POA: Diagnosis not present

## 2023-05-15 DIAGNOSIS — J441 Chronic obstructive pulmonary disease with (acute) exacerbation: Secondary | ICD-10-CM | POA: Diagnosis not present

## 2023-05-15 DIAGNOSIS — I5032 Chronic diastolic (congestive) heart failure: Secondary | ICD-10-CM | POA: Diagnosis not present

## 2023-05-15 DIAGNOSIS — E079 Disorder of thyroid, unspecified: Secondary | ICD-10-CM | POA: Diagnosis not present

## 2023-05-15 DIAGNOSIS — I130 Hypertensive heart and chronic kidney disease with heart failure and stage 1 through stage 4 chronic kidney disease, or unspecified chronic kidney disease: Secondary | ICD-10-CM | POA: Diagnosis not present

## 2023-05-15 DIAGNOSIS — N1831 Chronic kidney disease, stage 3a: Secondary | ICD-10-CM | POA: Diagnosis not present

## 2023-05-15 DIAGNOSIS — J439 Emphysema, unspecified: Secondary | ICD-10-CM | POA: Diagnosis not present

## 2023-05-19 ENCOUNTER — Ambulatory Visit (INDEPENDENT_AMBULATORY_CARE_PROVIDER_SITE_OTHER): Payer: Medicare HMO | Admitting: Family Medicine

## 2023-05-19 ENCOUNTER — Encounter: Payer: Self-pay | Admitting: Family Medicine

## 2023-05-19 VITALS — BP 146/61 | HR 54 | Ht 71.0 in | Wt 181.0 lb

## 2023-05-19 DIAGNOSIS — J441 Chronic obstructive pulmonary disease with (acute) exacerbation: Secondary | ICD-10-CM

## 2023-05-19 NOTE — Assessment & Plan Note (Signed)
 Recent COPD exacerbation with hospitalization.  He has recovered well from this at this point.  Continue Breztri as directed.  Albuterol as needed.  Red flags reviewed

## 2023-05-19 NOTE — Patient Instructions (Signed)
 Continue current inhalers.  Let us know if symptoms change or worsen.

## 2023-05-19 NOTE — Progress Notes (Signed)
 Samuel Mcconnell - 89 y.o. male MRN [id number]  Date of birth: [birth year]  Subjective Chief Complaint  Patient presents with   Hospitalization Follow-up    HPI Samuel Mcconnell is a 89 year old male here today for follow-up.  Recently hospitalized due to increased dyspnea.  Treated for COPD exacerbation with antibiotics and steroids.  He did receive scheduled nebulizer treatments as well.  Reports that symptoms improved fairly quickly.  He is feeling much better at this point.  He is using Breztri but has not been using this regularly.  He denies increased dyspnea, chest pain or tightness or increased sputum production at this time.  ROS:  A comprehensive ROS was completed and negative except as noted per HPI  Allergies  Allergen Reactions   Clindamycin/Lincomycin Other (See Comments)    Patient "felt weird"    Dabigatran Etexilate Mesylate Other (See Comments)    "felt weird" (name brand Pradaxa)   Doxycycline Other (See Comments)    Dyspepsia    Past Medical History:  Diagnosis Date   Arthritis    Atrial fibrillation (HCC)    radiofrequency ablation   Cataract    CHF (congestive heart failure) (HCC)    Clotting disorder (HCC)    due to medication   Emphysema    Emphysema of lung (HCC)    Hypertension    Thyroid disease    Ulcer    GI years prior    Past Surgical History:  Procedure Laterality Date   CARDIAC SURGERY     CARDIOVERSION N/A 10/24/2019   Procedure: CARDIOVERSION;  Surgeon: Thurmon Fair, MD;  Location: [facility] ENDOSCOPY;  Service: Cardiovascular;  Laterality: N/A;   CARDIOVERSION N/A 10/22/2020   Procedure: CARDIOVERSION;  Surgeon: Vesta Mixer, MD;  [facility];  Service: Cardiovascular;  Laterality: N/A;   COLON SURGERY  03/23/1997   EYE SURGERY     KNEE ARTHROSCOPY Left 07/21/2000   LOOP RECORDER IMPLANT  01/16/2010   Medtronic Reveal XT (Dr. Claudia Desanctis)    RADIOFREQUENCY ABLATION  06/13/2009   SHOULDER ARTHROSCOPY WITH ROTATOR CUFF REPAIR  Left 04/23/1996   TIBIAL TUBERCLERPLASTY  ~ 10/2010   TRANSTHORACIC ECHOCARDIOGRAM  11/19/2011   EF=>55%; mild MR, mild mitral annular calcif; mild TR, normal RSVP; AV mildly sclerotic, mild AV regurg; trace pulm valve regurg     Social History   Socioeconomic History   Marital status: Married    Spouse name: Samuel Mcconnell   Number of children: 2   Years of education: 16   Highest education level: Bachelor's degree (e.g., BA, AB, BS)  Occupational History   Occupation: Retired  Tobacco Use   Smoking status: Former    Current packs/day: 0.00    Types: Cigarettes    Start date: 03/14/1956    Quit date: 03/14/2002    Years since quitting: 21.1   Smokeless tobacco: Never   Tobacco comments:    Former smoker 01/23/2021  Vaping Use   Vaping status: Never Used  Substance and Sexual Activity   Alcohol use: Yes    Alcohol/week: 1.0 standard drink of alcohol    Types: 1 Glasses of wine per week   Drug use: No   Sexual activity: Not Currently    Partners: Female    Birth control/protection: None  Other Topics Concern   Not on file  Social History Narrative   Lives at [employer] with his wife. He is still driving. He enjoys walking and going to the gym.   Social Drivers of Health  Financial Resource Strain: Low Risk  (03/30/2023)   Overall Financial Resource Strain (CARDIA)    Difficulty of Paying Living Expenses: Not hard at all  Food Insecurity: No Food Insecurity (05/12/2023)   Hunger Vital Sign    Worried About Running Out of Food in the Last Year: Never true    Ran Out of Food in the Last Year: Never true  Transportation Needs: No Transportation Needs (05/12/2023)   PRAPARE - Administrator, Civil Service (Medical): No    Lack of Transportation (Non-Medical): No  Physical Activity: Inactive (03/30/2023)   Exercise Vital Sign    Days of Exercise per Week: 0 days    Minutes of Exercise per Session: 20 min  Stress: No Stress Concern Present (03/30/2023)   [employer] of Occupational Health - Occupational Stress Questionnaire    Feeling of Stress : Only a little  Social Connections: Socially Integrated (05/09/2023)   Social Connection and Isolation Panel [NHANES]    Frequency of Communication with Friends and Family: More than three times a week    Frequency of Social Gatherings with Friends and Family: More than three times a week    Attends Religious Services: 1 to 4 times per year    Active Member of [employer] or Organizations: Yes    Attends Banker Meetings: Never    Marital Status: Married    Family History  Problem Relation Age of Onset   Heart failure Mother    Heart failure Father        MI - died @ 54   Atrial fibrillation Sister    Hypertension Sister    Heart Problems Sister    Diabetes Sister    Heart failure Sister    Atrial fibrillation Brother        dx'ed age 85   Atrial fibrillation Brother        dx'ed age 71   Heart failure Brother    Diabetes Son    Pancreatitis Son        also ETOH abuse    Cancer Maternal Grandmother    Heart disease Maternal Grandfather     Health Maintenance  Topic Date Due   COVID-19 Vaccine (7 - 2024-25 season) 02/05/2023   Medicare Annual Wellness (AWV)  11/25/2023   DTaP/Tdap/Td (3 - Td or Tdap) 09/06/2029   Pneumonia Vaccine 15+ Years old  Completed   INFLUENZA VACCINE  Completed   Zoster Vaccines- Shingrix  Completed   HPV VACCINES  Aged Out     ----------------------------------------------------------------------------------------------------------------------------------------------------------------------------------------------------------------- Physical Exam BP (!) 146/61 (BP Location: Left Arm, Patient Position: Sitting, Cuff Size: Normal)   Pulse (!) 54   Ht 5\' 11"  (1.803 m)   Wt 181 lb (82.1 kg)   SpO2 94%   BMI 25.24 kg/m   Physical Exam Constitutional:      Appearance: Normal appearance.  HENT:     Head: Normocephalic and atraumatic.  Eyes:      General: No scleral icterus. Cardiovascular:     Rate and Rhythm: Normal rate and regular rhythm.  Pulmonary:     Effort: Pulmonary effort is normal.     Breath sounds: Normal breath sounds.  Musculoskeletal:     Cervical back: Neck supple.  Neurological:     Mental Status: He is alert.  Psychiatric:        Mood and Affect: Mood normal.        Behavior: Behavior normal.     ------------------------------------------------------------------------------------------------------------------------------------------------------------------------------------------------------------------- Assessment and Plan  COPD exacerbation (HCC) Recent COPD exacerbation with hospitalization.  He has recovered well from this at this point.  Continue Breztri as directed.  Albuterol as needed.  Red flags reviewed   No orders of the defined types were placed in this encounter.   No follow-ups on file.    This visit occurred during the SARS-CoV-2 public health emergency.  Safety protocols were in place, including screening questions prior to the visit, additional usage of staff PPE, and extensive cleaning of exam room while observing appropriate contact time as indicated for disinfecting solutions.

## 2023-05-21 ENCOUNTER — Other Ambulatory Visit: Payer: Self-pay | Admitting: Family Medicine

## 2023-05-21 DIAGNOSIS — E079 Disorder of thyroid, unspecified: Secondary | ICD-10-CM | POA: Diagnosis not present

## 2023-05-21 DIAGNOSIS — J441 Chronic obstructive pulmonary disease with (acute) exacerbation: Secondary | ICD-10-CM | POA: Diagnosis not present

## 2023-05-21 DIAGNOSIS — N1831 Chronic kidney disease, stage 3a: Secondary | ICD-10-CM | POA: Diagnosis not present

## 2023-05-21 DIAGNOSIS — I5032 Chronic diastolic (congestive) heart failure: Secondary | ICD-10-CM | POA: Diagnosis not present

## 2023-05-21 DIAGNOSIS — I130 Hypertensive heart and chronic kidney disease with heart failure and stage 1 through stage 4 chronic kidney disease, or unspecified chronic kidney disease: Secondary | ICD-10-CM | POA: Diagnosis not present

## 2023-05-21 DIAGNOSIS — J439 Emphysema, unspecified: Secondary | ICD-10-CM | POA: Diagnosis not present

## 2023-05-21 DIAGNOSIS — M1990 Unspecified osteoarthritis, unspecified site: Secondary | ICD-10-CM | POA: Diagnosis not present

## 2023-05-21 DIAGNOSIS — J9601 Acute respiratory failure with hypoxia: Secondary | ICD-10-CM | POA: Diagnosis not present

## 2023-05-21 DIAGNOSIS — I480 Paroxysmal atrial fibrillation: Secondary | ICD-10-CM | POA: Diagnosis not present

## 2023-05-24 DIAGNOSIS — I480 Paroxysmal atrial fibrillation: Secondary | ICD-10-CM

## 2023-05-26 DIAGNOSIS — E079 Disorder of thyroid, unspecified: Secondary | ICD-10-CM | POA: Diagnosis not present

## 2023-05-26 DIAGNOSIS — N1831 Chronic kidney disease, stage 3a: Secondary | ICD-10-CM | POA: Diagnosis not present

## 2023-05-26 DIAGNOSIS — M1990 Unspecified osteoarthritis, unspecified site: Secondary | ICD-10-CM | POA: Diagnosis not present

## 2023-05-26 DIAGNOSIS — I480 Paroxysmal atrial fibrillation: Secondary | ICD-10-CM | POA: Diagnosis not present

## 2023-05-26 DIAGNOSIS — J441 Chronic obstructive pulmonary disease with (acute) exacerbation: Secondary | ICD-10-CM | POA: Diagnosis not present

## 2023-05-26 DIAGNOSIS — J439 Emphysema, unspecified: Secondary | ICD-10-CM | POA: Diagnosis not present

## 2023-05-26 DIAGNOSIS — I130 Hypertensive heart and chronic kidney disease with heart failure and stage 1 through stage 4 chronic kidney disease, or unspecified chronic kidney disease: Secondary | ICD-10-CM | POA: Diagnosis not present

## 2023-05-26 DIAGNOSIS — J9601 Acute respiratory failure with hypoxia: Secondary | ICD-10-CM | POA: Diagnosis not present

## 2023-05-26 DIAGNOSIS — I5032 Chronic diastolic (congestive) heart failure: Secondary | ICD-10-CM | POA: Diagnosis not present

## 2023-05-27 ENCOUNTER — Other Ambulatory Visit: Payer: Self-pay | Admitting: Family Medicine

## 2023-05-27 DIAGNOSIS — J309 Allergic rhinitis, unspecified: Secondary | ICD-10-CM

## 2023-06-01 DIAGNOSIS — I480 Paroxysmal atrial fibrillation: Secondary | ICD-10-CM | POA: Diagnosis not present

## 2023-06-02 ENCOUNTER — Encounter: Payer: Self-pay | Admitting: Cardiovascular Disease

## 2023-06-02 ENCOUNTER — Other Ambulatory Visit: Payer: Self-pay | Admitting: Cardiovascular Disease

## 2023-06-02 DIAGNOSIS — I480 Paroxysmal atrial fibrillation: Secondary | ICD-10-CM

## 2023-06-02 MED ORDER — APIXABAN 5 MG PO TABS: 5.0000 mg | ORAL_TABLET | Freq: Two times a day (BID) | ORAL | 1 refills | Status: DC

## 2023-06-02 NOTE — Telephone Encounter (Signed)
 Mcconnell, Samuel, RPH  [name], RN Caller: Unspecified (Today, 10:56 AM) Yes, please change dose to 5mg  BID. 6 month supply  Called pt's daughter regarding increasing the pt's dose of eliquis from 2.5mg  BID to 5mg  BID  She states she will go by the house to check to see how much eliquis he has left so nothing will be wasted and to see if has enough to double up until mail order received. Advised to call back tomorrow so we can be updated and if we need to send any locally. She is aware I will send a 90 day supply to Mail order for the new dose.

## 2023-06-02 NOTE — Telephone Encounter (Addendum)
 Eliquis 2.5mg  refill request received. Patient is 89 years old, weight-82.1kg, Crea-1.46 on 05/11/23 (05/10/23-1.32, 05/09/23-1.38, 03/15/23-1.64, 05/18/22-1.53), Diagnosis-Afib, and last seen by Dr. Royann Shivers on 03/19/23. Dose is appropriate based on dosing criteria.   Will send to Pharmacist to evaluate dose.

## 2023-06-03 DIAGNOSIS — E079 Disorder of thyroid, unspecified: Secondary | ICD-10-CM | POA: Diagnosis not present

## 2023-06-03 DIAGNOSIS — J439 Emphysema, unspecified: Secondary | ICD-10-CM | POA: Diagnosis not present

## 2023-06-03 DIAGNOSIS — I480 Paroxysmal atrial fibrillation: Secondary | ICD-10-CM | POA: Diagnosis not present

## 2023-06-03 DIAGNOSIS — J9601 Acute respiratory failure with hypoxia: Secondary | ICD-10-CM | POA: Diagnosis not present

## 2023-06-03 DIAGNOSIS — M1990 Unspecified osteoarthritis, unspecified site: Secondary | ICD-10-CM | POA: Diagnosis not present

## 2023-06-03 DIAGNOSIS — I5032 Chronic diastolic (congestive) heart failure: Secondary | ICD-10-CM | POA: Diagnosis not present

## 2023-06-03 DIAGNOSIS — J441 Chronic obstructive pulmonary disease with (acute) exacerbation: Secondary | ICD-10-CM | POA: Diagnosis not present

## 2023-06-03 DIAGNOSIS — I130 Hypertensive heart and chronic kidney disease with heart failure and stage 1 through stage 4 chronic kidney disease, or unspecified chronic kidney disease: Secondary | ICD-10-CM | POA: Diagnosis not present

## 2023-06-03 DIAGNOSIS — N1831 Chronic kidney disease, stage 3a: Secondary | ICD-10-CM | POA: Diagnosis not present

## 2023-06-11 ENCOUNTER — Ambulatory Visit: Payer: Medicare HMO | Attending: Cardiovascular Disease | Admitting: Cardiovascular Disease

## 2023-06-11 ENCOUNTER — Other Ambulatory Visit: Payer: Self-pay | Admitting: *Deleted

## 2023-06-11 ENCOUNTER — Encounter: Payer: Self-pay | Admitting: Cardiovascular Disease

## 2023-06-11 VITALS — BP 120/54 | HR 62 | Ht 71.5 in | Wt 184.0 lb

## 2023-06-11 DIAGNOSIS — I10 Essential (primary) hypertension: Secondary | ICD-10-CM | POA: Diagnosis not present

## 2023-06-11 DIAGNOSIS — I495 Sick sinus syndrome: Secondary | ICD-10-CM | POA: Diagnosis not present

## 2023-06-11 DIAGNOSIS — J449 Chronic obstructive pulmonary disease, unspecified: Secondary | ICD-10-CM | POA: Diagnosis not present

## 2023-06-11 DIAGNOSIS — Z79899 Other long term (current) drug therapy: Secondary | ICD-10-CM

## 2023-06-11 DIAGNOSIS — N1832 Chronic kidney disease, stage 3b: Secondary | ICD-10-CM

## 2023-06-11 DIAGNOSIS — I480 Paroxysmal atrial fibrillation: Secondary | ICD-10-CM

## 2023-06-11 DIAGNOSIS — I5032 Chronic diastolic (congestive) heart failure: Secondary | ICD-10-CM | POA: Diagnosis not present

## 2023-06-11 DIAGNOSIS — M1990 Unspecified osteoarthritis, unspecified site: Secondary | ICD-10-CM | POA: Diagnosis not present

## 2023-06-11 DIAGNOSIS — D6869 Other thrombophilia: Secondary | ICD-10-CM

## 2023-06-11 DIAGNOSIS — J441 Chronic obstructive pulmonary disease with (acute) exacerbation: Secondary | ICD-10-CM | POA: Diagnosis not present

## 2023-06-11 DIAGNOSIS — E079 Disorder of thyroid, unspecified: Secondary | ICD-10-CM | POA: Diagnosis not present

## 2023-06-11 DIAGNOSIS — I130 Hypertensive heart and chronic kidney disease with heart failure and stage 1 through stage 4 chronic kidney disease, or unspecified chronic kidney disease: Secondary | ICD-10-CM | POA: Diagnosis not present

## 2023-06-11 DIAGNOSIS — J439 Emphysema, unspecified: Secondary | ICD-10-CM | POA: Diagnosis not present

## 2023-06-11 DIAGNOSIS — N1831 Chronic kidney disease, stage 3a: Secondary | ICD-10-CM | POA: Diagnosis not present

## 2023-06-11 DIAGNOSIS — Z5181 Encounter for therapeutic drug level monitoring: Secondary | ICD-10-CM | POA: Diagnosis not present

## 2023-06-11 DIAGNOSIS — J9601 Acute respiratory failure with hypoxia: Secondary | ICD-10-CM | POA: Diagnosis not present

## 2023-06-11 MED ORDER — FUROSEMIDE 40 MG PO TABS: 40.0000 mg | ORAL_TABLET | Freq: Every day | ORAL | 1 refills | Status: DC

## 2023-06-11 MED ORDER — POTASSIUM CHLORIDE ER 10 MEQ PO TBCR: 10.0000 meq | EXTENDED_RELEASE_TABLET | Freq: Every day | ORAL | 1 refills | Status: DC

## 2023-06-11 NOTE — Progress Notes (Signed)
 Cardiology Office Note    Date:  06/12/2023   ID:  [name], DOB [birth year], MRN [id number]  PCP:  Everrett Coombe, DO  Cardiologist:   Thurmon Fair, MD   Chief Complaint  Patient presents with   Atrial Fibrillation     History of Present Illness:  Samuel Mcconnell is a 89 y.o. male with history of radiofrequency ablation for atrial fibrillation roughly 10 years ago, recently recurrent as persistent atrial fibrillation for which he underwent elective cardioversion successfully in August 2022.  He has a tendency to have sinus bradycardia as well as first-degree AV block and right bundle branch block.  His wife Samuel Mcconnell was also my patient, but passed away from sepsis just 2 weeks ago  (they had celebrated 30 years of marriage just 3 months earlier).  He lives at [facility].  Samuel Mcconnell is here with his daughter, Samuel Mcconnell.  He was hospitalized 02/16 - 05/11/2023 with COPD exacerbation and acute hypoxic respiratory failure.  He was treated with intravenous steroids antibiotics and bronchodilators.  BNP was marginally elevated at 130.  His echocardiogram showed hyperdynamic left and right ventricular function and both his atria were described as normal in size.  He believes that he is breathing better than last appointment.  He thinks the improvement is attributable to switching from Trelegy to [place].  He still has bilateral lower extremity edema.  This is also a little better.  Since he has had recurrent problems with pneumonia (most recently admitted in August 2023) there is some suspicion he may be having aspiration.  He has had an evaluation by speech pathologist and has undergone some therapy for this.  His energy has improved also.  He has not had any chest pain, dizziness or syncope although he remains relatively unsteady on his feet.  In December his event monitor showed an extremely high burden of nonsustained and brief sustained atrial tachycardia (8000 episodes in 2 weeks with  longest episode up to 3 minutes).  There was no atrial fibrillation.  We increase his amiodarone dose from 5 days a week to daily.  A follow-up arrhythmia monitor shows improvement.  There were 600 episodes of atrial tachycardia and in 3.5 days, roughly a fourfold decline in number of events.  Overall atrial arrhythmia accounts for 2.5% of all beats and there were no sustained episodes of atrial tachycardia (longest episode of arrhythmia 12.6 seconds in duration.  Again there was no atrial fibrillation.  As has always been the case, all of her has never been aware of palpitations when he has arrhythmia.  In 2021 he had persistent atrial fibrillation for couple of months before he required hospitalization for RVR.  His weight remains above previous estimated dry weight of 177-180 pounds (184 pounds today).  In addition to ankle edema he does have mild jugular venous distention.  His echocardiogram in February 2025 continues to show normal left ventricular systolic function (EF 70 to 75%) no significant valvular abnormalities and both his atria were described as being normal in size.  PA pressure was not reported.  His inferior vena cava was described as dilated and plethoric.  An echo in 2021 at [facility] he described his right heart chambers as being mildly dilated and estimated his systolic PA pressure at 50-59 mmHg.  On September 07 2019, he fell down the basement stairs while carrying heavy equipment.  On arrival to the hospital [facility]) he was found to be in atrial fibrillation.  Looking back his family wonders whether the  arrhythmia may have started a month or 2 before, when he started complaining of some fatigue.  He was hospitalized and rate control was achieved on a combination of metoprolol and diltiazem.  Eliquis was started.  He had an echocardiogram that showed normal left ventricular systolic function with mild to moderate tricuspid regurgitation and moderate pulmonary hypertension.    He has  hypertension, COPD, treated hypothyroidism and acid reflux. He has a long-standing right bundle branch block.  Past Medical History:  Diagnosis Date   Arthritis    Atrial fibrillation (HCC)    radiofrequency ablation   Cataract    CHF (congestive heart failure) (HCC)    Clotting disorder (HCC)    due to medication   Emphysema    Emphysema of lung (HCC)    Hypertension    Thyroid disease    Ulcer    GI years prior    Past Surgical History:  Procedure Laterality Date   CARDIAC SURGERY     CARDIOVERSION N/A 10/24/2019   Procedure: CARDIOVERSION;  Surgeon: Thurmon Fair, MD;  Location: [facility] ENDOSCOPY;  Service: Cardiovascular;  Laterality: N/A;   CARDIOVERSION N/A 10/22/2020   Procedure: CARDIOVERSION;  Surgeon: Vesta Mixer, MD;  Location: [facility] ENDOSCOPY;  Service: Cardiovascular;  Laterality: N/A;   COLON SURGERY  03/23/1997   EYE SURGERY     KNEE ARTHROSCOPY Left 07/21/2000   LOOP RECORDER IMPLANT  01/16/2010   Medtronic Reveal XT (Dr. Claudia Desanctis)    RADIOFREQUENCY ABLATION  06/13/2009   SHOULDER ARTHROSCOPY WITH ROTATOR CUFF REPAIR Left 04/23/1996   TIBIAL TUBERCLERPLASTY  ~ 10/2010   TRANSTHORACIC ECHOCARDIOGRAM  11/19/2011   EF=>55%; mild MR, mild mitral annular calcif; mild TR, normal RSVP; AV mildly sclerotic, mild AV regurg; trace pulm valve regurg     Current Medications: Outpatient Medications Prior to Visit  Medication Sig Dispense Refill   acetaminophen (TYLENOL) 500 MG tablet Take 500 mg by mouth daily as needed for mild pain (pain score 1-3), moderate pain (pain score 4-6) or headache.     albuterol (VENTOLIN HFA) 108 (90 Base) MCG/ACT inhaler Inhale 2 puffs into the lungs every 6 (six) hours as needed for wheezing or shortness of breath. 8 g 1   AMBULATORY NON FORMULARY MEDICATION Please provide toilet riser/elevator with arms.  Diagnosis: Generalized weakness R53.1 1 Units 0   amiodarone (PACERONE) 200 MG tablet Take 1 tablet (200 mg total) by mouth daily. 90  tablet 3   apixaban (ELIQUIS) 5 MG TABS tablet Take 1 tablet (5 mg total) by mouth 2 (two) times daily. 180 tablet 1   Budeson-Glycopyrrol-Formoterol (BREZTRI AEROSPHERE) 160-9-4.8 MCG/ACT AERO Inhale 2 puffs into the lungs 2 (two) times daily. 3 each 1   Cholecalciferol (VITAMIN D3) 125 MCG (5000 UT) TABS Take 5,000 Units by mouth daily with lunch.     diclofenac Sodium (VOLTAREN) 1 % GEL Apply 2 g topically 4 (four) times daily as needed (pain).     fluticasone (FLONASE) 50 MCG/ACT nasal spray Place 2 sprays into both nostrils daily as needed for allergies or rhinitis. 16 g 2   levothyroxine (SYNTHROID) 137 MCG tablet TAKE 1 TABLET EVERY DAY BEFORE BREAKFAST (NEED LABS AND MD APPOINTMENT) (Patient taking differently: Take 137 mcg by mouth daily before breakfast.) 90 tablet 3   Multiple Vitamin (MULTIVITAMIN) capsule Take 1 capsule by mouth daily with breakfast.     NON FORMULARY Take 2 tablets by mouth See admin instructions. SuperBeets Heart Chews- Chew 2 tablets by mouth once a  day     rOPINIRole (REQUIP) 0.25 MG tablet Take 3 tablets (0.75 mg total) by mouth at bedtime. 270 tablet 1   zinc gluconate 50 MG tablet Take 50 mg by mouth daily with lunch.     furosemide (LASIX) 20 MG tablet TAKE 1 TABLET EVERY DAY AS NEEDED FOR EDEMA (Patient taking differently: Take 20-40 mg by mouth in the morning.) 90 tablet 3   diphenhydramine-acetaminophen (TYLENOL PM) 25-500 MG TABS tablet Take 1 tablet by mouth at bedtime as needed (for sleeplessness). (Patient not taking: Reported on 06/11/2023)     triamcinolone ointment (KENALOG) 0.1 % Apply 1 Application topically 2 (two) times daily as needed (irritation). (Patient not taking: Reported on 06/11/2023)     No facility-administered medications prior to visit.     Allergies:   Clindamycin/lincomycin, Dabigatran etexilate mesylate, and Doxycycline  Family History:  The patient's family history includes Atrial fibrillation in his brother, brother, and sister;  Cancer in his maternal grandmother; Diabetes in his sister and son; Heart Problems in his sister; Heart disease in his maternal grandfather; Heart failure in his brother, father, mother, and sister; Hypertension in his sister; Pancreatitis in his son.   ROS:   Please see the history of present illness.    ROS All other systems are reviewed and are negative.   PHYSICAL EXAM:   VS:  BP (!) 120/54   Pulse 62   Ht 5' 11.5" (1.816 m)   Wt 184 lb (83.5 kg)   SpO2 91%   BMI 25.31 kg/m      General: Alert, oriented x3, no distress, appears a little frail Head: no evidence of trauma, PERRL, EOMI, no exophtalmos or lid lag, no myxedema, no xanthelasma; normal ears, nose and oropharynx Neck: normal jugular venous pulsations and no hepatojugular reflux; brisk carotid pulses without delay and no carotid bruits Chest: clear to auscultation, no signs of consolidation by percussion or palpation, normal fremitus, symmetrical and full respiratory excursions Cardiovascular: normal position and quality of the apical impulse, regular rhythm with occasional ectopy, normal first and widely split second heart sounds, no murmurs, rubs or gallops Abdomen: no tenderness or distention, no masses by palpation, no abnormal pulsatility or arterial bruits, normal bowel sounds, no hepatosplenomegaly Extremities: no clubbing, cyanosis or edema; 2+ radial, ulnar and brachial pulses bilaterally; 2+ right femoral, posterior tibial and dorsalis pedis pulses; 2+ left femoral, posterior tibial and dorsalis pedis pulses; no subclavian or femoral bruits Neurological: grossly nonfocal Psych: Normal mood and affect   Wt Readings from Last 3 Encounters:  06/11/23 184 lb (83.5 kg)  05/19/23 181 lb (82.1 kg)  05/09/23 186 lb 8.2 oz (84.6 kg)      Studies/Labs Reviewed:   ECHO 05/11/2023   1. Left ventricular ejection fraction, by estimation, is 70 to 75%. The  left ventricle has hyperdynamic function. The left ventricle has  no  regional wall motion abnormalities. There is severe concentric left  ventricular hypertrophy. Left ventricular  diastolic parameters are indeterminate.   2. Right ventricular systolic function is hyperdynamic. The right  ventricular size is normal.   3. The mitral valve is normal in structure. No evidence of mitral valve  regurgitation. No evidence of mitral stenosis.   4. The aortic valve is tricuspid. Aortic valve regurgitation is not  visualized. No aortic stenosis is present.   5. The inferior vena cava is dilated in size with <50% respiratory  variability, suggesting right atrial pressure of 15 mmHg.   EKG: Personally reviewed previous  tracing from 05/09/2023 which showed sinus rhythm with a couple of PACs and old RBBB.  It does not show atrial fibrillation as the report suggests.  EKG Interpretation Date/Time:    Ventricular Rate:    PR Interval:    QRS Duration:    QT Interval:    QTC Calculation:   R Axis:      Text Interpretation:           Recent Labs:    Latest Ref Rng & Units 05/11/2023    5:39 AM 05/10/2023    6:13 AM 05/09/2023   12:59 PM  BMP  Glucose 70 - 99 mg/dL 960  454  098   BUN 8 - 23 mg/dL 39  29  25   Creatinine 0.61 - 1.24 mg/dL 1.19  1.47  8.29   Sodium 135 - 145 mmol/L 133  136  136   Potassium 3.5 - 5.1 mmol/L 3.6  4.1  4.0   Chloride 98 - 111 mmol/L 98  100  102   CO2 22 - 32 mmol/L 25  25  26    Calcium 8.9 - 10.3 mg/dL 8.5  8.8  9.0       Lipid Panel 08/28/2019 Cholesterol 160, triglycerides 79, HDL 41, LDL 104  Lipid Panel  No results found for: "CHOL", "TRIG", "HDL", "CHOLHDL", "VLDL", "LDLCALC", "LDLDIRECT", "LABVLDL"  Additional studies/ records that were reviewed today include:   Notes/Labs/ECG/echo from hospitalization February 2025  ASSESSMENT:    1. PAF (paroxysmal atrial fibrillation) (HCC)   2. Acquired thrombophilia (HCC)   3. Encounter for monitoring amiodarone therapy   4. Tachycardia-bradycardia syndrome  (HCC)   5. Chronic diastolic heart failure (HCC)   6. Chronic obstructive pulmonary disease, unspecified COPD type (HCC)   7. Essential hypertension   8. Stage 3b chronic kidney disease (HCC)   9. Medication management       PLAN:  In order of problems listed above:  AFib/atypical atrial flutter: Recently, he has not had any well-documented recurrence of either atrial fibrillation or atypical atrial flutter, but has had extremely frequent bursts of atrial tachycardia.  Some improvement on increased dosing of amiodarone (increased from 5 times weekly to daily).  He is anticoagulated appropriately: CHADSVasc at least 4 (age 19, HTN, CHF). Anticoagulation: He is 89 years old and his creatinine is very close to the cutoff where we would decrease his dose of Eliquis to 2.5 mg twice daily.  Monitor renal function closely. Amiodarone: Normal liver and thyroid function tests last month.  No other signs of toxicity noted. Tachybradycardia syndrome: On 2 monitors performed in the last 3 months he has not had any episodes of significant bradycardia.  He has first-degree AV block and RBBB.  No symptoms of bradycardia.  Pacemaker not indicated at this time. CHF: He is a few pounds over his "dry weight" and he has lower extremity edema and mild JVD.  Note previous history of orthostatic hypotension so need to avoid rapid or excessive diuresis. COPD: Recent exacerbation with respiratory failure and hypoxia in February.  Avoiding beta-blockers.  Seems to be doing well on Breztri. HTN: Well-controlled without medications.  Note markedly low diastolic blood pressure.  Avoid rapid changes in position. CKD 3b: Stable.  Baseline GFR seems to be around 45, creatinine 1.3-1.5 range.  Medication Adjustments/Labs and Tests Ordered: Current medicines are reviewed at length with the patient today.  Concerns regarding medicines are outlined above.  Medication changes, Labs and Tests ordered today are listed in the  Patient  Instructions below. Patient Instructions  Medication Instructions:  Your physician has recommended you make the following change in your medication:  INCREASE Lasix to 40 mg daily START Potassium 10 mEq once daily  *If you need a refill on your cardiac medications before your next appointment, please call your pharmacy*   Lab Work: Your physician recommends that you return for lab work in July for: CMET & TSH  If you have any lab test that is abnormal or we need to change your treatment, we will call you to review the results.   Testing/Procedures: None ordered   Follow-Up: At [facility], you and your health needs are our priority.  As part of our continuing mission to provide you with exceptional heart care, we have created designated Provider Care Teams.  These Care Teams include your primary Cardiologist (physician) and Advanced Practice Providers (APPs -  Physician Assistants and Nurse Practitioners) who all work together to provide you with the care you need, when you need it.   Your next appointment:   August     The format for your next appointment:   In Person  Provider:   Thurmon Fair, MD {   Thank you for choosing [facility]!!   [phone]  Other Instructions   Keep a log of daily weights, bring with you to your next appointment         Signed, Thurmon Fair, MD  06/12/2023 2:52 PM    [facility] [address] [place], [place], [place]  [address] Phone: [phone]; Fax: [phone]

## 2023-06-11 NOTE — Patient Instructions (Addendum)
 Medication Instructions:  Your physician has recommended you make the following change in your medication:  INCREASE Lasix to 40 mg daily START Potassium 10 mEq once daily  *If you need a refill on your cardiac medications before your next appointment, please call your pharmacy*   Lab Work: Your physician recommends that you return for lab work in July for: CMET & TSH  If you have any lab test that is abnormal or we need to change your treatment, we will call you to review the results.   Testing/Procedures: None ordered   Follow-Up: At [facility], you and your health needs are our priority.  As part of our continuing mission to provide you with exceptional heart care, we have created designated Provider Care Teams.  These Care Teams include your primary Cardiologist (physician) and Advanced Practice Providers (APPs -  Physician Assistants and Nurse Practitioners) who all work together to provide you with the care you need, when you need it.   Your next appointment:   August     The format for your next appointment:   In Person  Provider:   Thurmon Fair, MD {   Thank you for choosing [facility]!!   [phone]  Other Instructions   Keep a log of daily weights, bring with you to your next appointment

## 2023-06-18 ENCOUNTER — Telehealth: Payer: Self-pay

## 2023-06-18 DIAGNOSIS — J441 Chronic obstructive pulmonary disease with (acute) exacerbation: Secondary | ICD-10-CM | POA: Diagnosis not present

## 2023-06-18 DIAGNOSIS — I130 Hypertensive heart and chronic kidney disease with heart failure and stage 1 through stage 4 chronic kidney disease, or unspecified chronic kidney disease: Secondary | ICD-10-CM | POA: Diagnosis not present

## 2023-06-18 DIAGNOSIS — M1990 Unspecified osteoarthritis, unspecified site: Secondary | ICD-10-CM | POA: Diagnosis not present

## 2023-06-18 DIAGNOSIS — I480 Paroxysmal atrial fibrillation: Secondary | ICD-10-CM | POA: Diagnosis not present

## 2023-06-18 DIAGNOSIS — J9601 Acute respiratory failure with hypoxia: Secondary | ICD-10-CM | POA: Diagnosis not present

## 2023-06-18 DIAGNOSIS — I5032 Chronic diastolic (congestive) heart failure: Secondary | ICD-10-CM | POA: Diagnosis not present

## 2023-06-18 DIAGNOSIS — E079 Disorder of thyroid, unspecified: Secondary | ICD-10-CM | POA: Diagnosis not present

## 2023-06-18 DIAGNOSIS — J439 Emphysema, unspecified: Secondary | ICD-10-CM | POA: Diagnosis not present

## 2023-06-18 DIAGNOSIS — N1831 Chronic kidney disease, stage 3a: Secondary | ICD-10-CM | POA: Diagnosis not present

## 2023-06-18 NOTE — Telephone Encounter (Signed)
 Copied from CRM [id number]. Topic: Clinical - Medical Advice >> Jun 18, 2023  2:32 PM Marland Kitchen D wrote: Patient's daughter says the physical therapist called saying the patient has crackling and wet sounds  down in his lungs can doctor call in a prescription for antibiotic and steroid to prevent pneumonia

## 2023-06-18 NOTE — Telephone Encounter (Signed)
 Patient daughter [name] informed.  Urged to daughter to take patient to urgent care as no availability in office  She is agreeable and plans to do this on Saturday mornning .

## 2023-06-19 ENCOUNTER — Ambulatory Visit (INDEPENDENT_AMBULATORY_CARE_PROVIDER_SITE_OTHER)

## 2023-06-19 ENCOUNTER — Other Ambulatory Visit: Payer: Self-pay

## 2023-06-19 ENCOUNTER — Ambulatory Visit
Admission: EM | Admit: 2023-06-19 | Discharge: 2023-06-19 | Disposition: A | Discharge status: Home / Self Care | Attending: Family Medicine | Admitting: Family Medicine

## 2023-06-19 DIAGNOSIS — J209 Acute bronchitis, unspecified: Secondary | ICD-10-CM | POA: Diagnosis not present

## 2023-06-19 DIAGNOSIS — R059 Cough, unspecified: Secondary | ICD-10-CM | POA: Diagnosis not present

## 2023-06-19 DIAGNOSIS — J438 Other emphysema: Secondary | ICD-10-CM

## 2023-06-19 MED ORDER — PREDNISONE 20 MG PO TABS: 40.0000 mg | ORAL_TABLET | Freq: Every day | ORAL | 0 refills | Status: DC

## 2023-06-19 MED ORDER — AZITHROMYCIN 250 MG PO TABS: ORAL_TABLET | ORAL | 0 refills | Status: DC

## 2023-06-19 NOTE — Discharge Instructions (Signed)
 Continue to drink lots of fluids Take the Z-Pak as directed.  2 pills today, then 1 a day until gone Take prednisone daily for 5 days After prednisone you may take Allegra and Flonase for allergy symptoms Follow-up with primary care doctor if not improving by next week

## 2023-06-19 NOTE — ED Provider Notes (Signed)
 [facility]    CSN: [id number] Arrival date & time: 06/19/23  8657      History   Chief Complaint No chief complaint on file.   HPI Samuel Mcconnell is a 89 y.o. male.   89 year old gentleman who is brought in by his daughter for evaluation of cough.  She is a retired Engineer, civil (consulting).  He has had a cough for 5 days that appears to be getting worse.  The cough sounds harsh.  He is coughing up sputum.  He has underlying emphysema and she is concerned.  He was just hospitalized for respiratory infection and respiratory failure last month.  He seemed to be better until this new cough.  No fever or chills.  No nausea or vomiting.  Patient states he does feel tired.  He does feel more short of breath.  No chest pain    Past Medical History:  Diagnosis Date   Arthritis    Atrial fibrillation (HCC)    radiofrequency ablation   Cataract    CHF (congestive heart failure) (HCC)    Clotting disorder (HCC)    due to medication   Emphysema    Emphysema of lung (HCC)    Hypertension    Thyroid disease    Ulcer    GI years prior    Patient Active Problem List   Diagnosis Date Noted   Acute respiratory failure with hypoxia (HCC) 05/09/2023   Acute bronchitis 04/20/2023   Fall 03/31/2023   Hematoma 03/31/2023   COPD exacerbation (HCC) 02/08/2023   Rib pain on left side 10/22/2022   Thyroid nodule 09/09/2022   Chronic diastolic CHF (congestive heart failure) (HCC) 10/25/2021   Aspiration pneumonia (HCC) 07/02/2021   Scalp laceration 02/18/2021   Dry nares 02/18/2021   Restless legs 11/18/2020   Persistent atrial fibrillation (HCC) 10/14/2020   Secondary hypercoagulable state (HCC) 10/14/2020   Well adult exam 05/07/2020   Gait instability 11/26/2019   Hypothyroid 11/26/2019   Fall from slip, trip, or stumble, initial encounter 09/08/2019   Bilateral lower extremity edema 09/08/2019   CSF leak from ear 05/16/2019   Chronic obstructive pulmonary disease (HCC) 09/14/2017    Essential hypertension 09/14/2017   Myringotomy tube status 02/25/2017   LVH (left ventricular hypertrophy) 03/28/2016   Gallbladder calculus without cholecystitis 07/22/2014   Fatty liver 07/22/2014   PAF (paroxysmal atrial fibrillation) (HCC) 03/15/2013   CKD (chronic kidney disease) stage 3, GFR 30-59 ml/min (HCC) 02/21/2013   Allergic rhinitis 06/24/2012    Past Surgical History:  Procedure Laterality Date   CARDIAC SURGERY     CARDIOVERSION N/A 10/24/2019   Procedure: CARDIOVERSION;  Surgeon: Thurmon Fair, MD;  Location: [facility] ENDOSCOPY;  Service: Cardiovascular;  Laterality: N/A;   CARDIOVERSION N/A 10/22/2020   Procedure: CARDIOVERSION;  Surgeon: Vesta Mixer, MD;  Location: [facility] ENDOSCOPY;  Service: Cardiovascular;  Laterality: N/A;   COLON SURGERY  03/23/1997   EYE SURGERY     KNEE ARTHROSCOPY Left 07/21/2000   LOOP RECORDER IMPLANT  01/16/2010   [employer]tr[employer] Reveal XT (Dr. Claudia Desanctis)    RADIOFREQUENCY ABLATION  06/13/2009   SHOULDER ARTHROSCOPY WITH ROTATOR CUFF REPAIR Left 04/23/1996   TIBIAL TUBERCLERPLASTY  ~ 10/2010   TRANSTHORACIC ECHOCARDIOGRAM  11/19/2011   EF=>55%; mild MR, mild mitral annular calcif; mild TR, normal RSVP; AV mildly sclerotic, mild AV regurg; trace pulm valve regurg        Home Medications    Prior to Admission medications   Medication Sig Start Date End  Date Taking? Authorizing Provider  azithromycin (ZITHROMAX Z-PAK) 250 MG tablet Take two pills today followed by one a day until gone 06/19/23  Yes Eustace Moore, MD  predniSONE (DELTASONE) 20 MG tablet Take 2 tablets (40 mg total) by mouth daily with breakfast. 06/19/23  Yes Eustace Moore, MD  acetaminophen (TYLENOL) 500 MG tablet Take 500 mg by mouth daily as needed for mild pain (pain score 1-3), moderate pain (pain score 4-6) or headache.    [provider]  albuterol (VENTOLIN HFA) 108 (90 Base) MCG/ACT inhaler Inhale 2 puffs into the lungs every 6 (six) hours as needed  for wheezing or shortness of breath. 04/20/23   Everrett Coombe, DO  AMBULATORY NON FORMULARY MEDICATION Please provide toilet riser/elevator with arms.  Diagnosis: Generalized weakness R53.1 07/11/21   Everrett Coombe, DO  amiodarone (PACERONE) 200 MG tablet Take 1 tablet (200 mg total) by mouth daily. 03/19/23   Croitoru, Mihai, MD  apixaban (ELIQUIS) 5 MG TABS tablet Take 1 tablet (5 mg total) by mouth 2 (two) times daily. 06/02/23   Croitoru, Mihai, MD  Budeson-Glycopyrrol-Formoterol (BREZTRI AEROSPHERE) 160-9-4.8 MCG/ACT AERO Inhale 2 puffs into the lungs 2 (two) times daily. 05/11/23 08/09/23  Pokhrel, Rebekah [place], MD  Cholecalciferol (VITAMIN D3) 125 MCG (5000 UT) TABS Take 5,000 Units by mouth daily with lunch.    [provider]  diclofenac Sodium (VOLTAREN) 1 % GEL Apply 2 g topically 4 (four) times daily as needed (pain).    [provider]  diphenhydramine-acetaminophen (TYLENOL PM) 25-500 MG TABS tablet Take 1 tablet by mouth at bedtime as needed (for sleeplessness). Patient not taking: Reported on 06/11/2023    [provider]  fluticasone (FLONASE) 50 MCG/ACT nasal spray Place 2 sprays into both nostrils daily as needed for allergies or rhinitis. 05/27/23 08/25/23  Everrett Coombe, DO  furosemide (LASIX) 40 MG tablet Take 1 tablet (40 mg total) by mouth daily. 06/11/23 09/09/23  Croitoru, Mihai, MD  levothyroxine (SYNTHROID) 137 MCG tablet TAKE 1 TABLET EVERY DAY BEFORE BREAKFAST (NEED LABS AND MD APPOINTMENT) Patient taking differently: Take 137 mcg by mouth daily before breakfast. 07/28/22   Everrett Coombe, DO  Multiple Vitamin (MULTIVITAMIN) capsule Take 1 capsule by mouth daily with breakfast.    [provider]  NON FORMULARY Take 2 tablets by mouth See admin instructions. SuperBeets Heart Chews- Chew 2 tablets by mouth once a day    [provider]  potassium chloride (KLOR-CON) 10 MEQ tablet Take 1 tablet (10 mEq total) by mouth daily. 06/11/23 09/09/23   Croitoru, Mihai, MD  rOPINIRole (REQUIP) 0.25 MG tablet Take 3 tablets (0.75 mg total) by mouth at bedtime. 05/25/23 08/23/23  Everrett Coombe, DO  triamcinolone ointment (KENALOG) 0.1 % Apply 1 Application topically 2 (two) times daily as needed (irritation). Patient not taking: Reported on 06/11/2023    [provider]  zinc gluconate 50 MG tablet Take 50 mg by mouth daily with lunch.    [provider]    Family History Family History  Problem Relation Age of Onset   Heart failure Mother    Heart failure Father        MI - died @ 72   Atrial fibrillation Sister    Hypertension Sister    Heart Problems Sister    Diabetes Sister    Heart failure Sister    Atrial fibrillation Brother        dx'ed age 83   Atrial fibrillation Brother  dx'ed age 27   Heart failure Brother    Diabetes Son    Pancreatitis Son        also ETOH abuse    Cancer Maternal Grandmother    Heart disease Maternal Grandfather     Social History Social History   Tobacco Use   Smoking status: Former    Current packs/day: 0.00    Types: Cigarettes    Start date: 03/14/1956    Quit date: 03/14/2002    Years since quitting: 21.2   Smokeless tobacco: Never   Tobacco comments:    Former smoker 01/23/2021  Vaping Use   Vaping status: Never Used  Substance Use Topics   Alcohol use: Yes    Alcohol/week: 1.0 standard drink of alcohol    Types: 1 Glasses of wine per week   Drug use: No     Allergies   Clindamycin/lincomycin, Dabigatran etexilate mesylate, and Doxycycline   Review of Systems Review of Systems See HPI  Physical Exam Triage Vital Signs ED Triage Vitals  Encounter Vitals Group     BP 06/19/23 0826 119/61     Systolic BP Percentile --      Diastolic BP Percentile --      Pulse Rate 06/19/23 0826 67     Resp 06/19/23 0826 16     Temp 06/19/23 0826 98.2 F (36.8 C)     Temp src --      SpO2 06/19/23 0826 97 %     Weight --      Height --      Head  Circumference --      Peak Flow --      Pain Score 06/19/23 0830 0     Pain Loc --      Pain Education --      Exclude from Growth Chart --    No data found.  Updated Vital Signs BP 119/61   Pulse 67   Temp 98.2 F (36.8 C)   Resp 16   SpO2 97%      Physical Exam Constitutional:      General: He is not in acute distress.    Appearance: He is well-developed.  HENT:     Head: Normocephalic and atraumatic.     Nose: Nose normal.     Mouth/Throat:     Mouth: Mucous membranes are moist.     Pharynx: No posterior oropharyngeal erythema.  Eyes:     Conjunctiva/sclera: Conjunctivae normal.     Pupils: Pupils are equal, round, and reactive to light.  Cardiovascular:     Rate and Rhythm: Normal rate and regular rhythm.  Pulmonary:     Effort: Pulmonary effort is normal. No respiratory distress.     Breath sounds: Wheezing and rhonchi present.     Comments: Scattered wheeze and rhonchi throughout Abdominal:     General: There is no distension.     Palpations: Abdomen is soft.  Musculoskeletal:        General: Normal range of motion.     Cervical back: Normal range of motion.  Skin:    General: Skin is warm and dry.  Neurological:     Mental Status: He is alert.      UC Treatments / Results  Labs (all labs ordered are listed, but only abnormal results are displayed) Labs Reviewed - No data to display  EKG   Radiology DG Chest 2 View Result Date: 06/19/2023 CLINICAL DATA:  Cough for the past week. EXAM: CHEST -  2 VIEW COMPARISON:  Chest x-ray dated May 09, 2023. FINDINGS: The cardiomediastinal silhouette is normal in size. Normal pulmonary vascularity. No focal consolidation, pleural effusion, or pneumothorax. No acute osseous abnormality. IMPRESSION: No active cardiopulmonary disease. Electronically Signed   By: Obie Dredge M.D.   On: 06/19/2023 09:14    Procedures Procedures (including critical care time)  Medications Ordered in UC Medications - No  data to display  Initial Impression / Assessment and Plan / UC Course  I have reviewed the triage vital signs and the nursing notes.  Pertinent labs & imaging results that were available during my care of the patient were reviewed by me and considered in my medical decision making (see chart for details).     X-ray results discussed with patient and daughter Will cover respiratory infection with steroids and antibiotics given his underlying emphysema Discussed that allergies may be contributing.  Daughter will put him back on Allegra and Flonase after treatment Final Clinical Impressions(s) / UC Diagnoses   Final diagnoses:  Acute bronchitis, unspecified organism  Other emphysema (HCC)     Discharge Instructions      Continue to drink lots of fluids Take the Z-Pak as directed.  2 pills today, then 1 a day until gone Take prednisone daily for 5 days After prednisone you may take Allegra and Flonase for allergy symptoms Follow-up with primary care doctor if not improving by next week   ED Prescriptions     Medication Sig Dispense Auth. Provider   azithromycin (ZITHROMAX Z-PAK) 250 MG tablet Take two pills today followed by one a day until gone 6 tablet Eustace Moore, MD   predniSONE (DELTASONE) 20 MG tablet Take 2 tablets (40 mg total) by mouth daily with breakfast. 10 tablet Eustace Moore, MD      PDMP not reviewed this encounter.   Eustace Moore, MD 06/19/23 [phone]

## 2023-06-19 NOTE — ED Triage Notes (Signed)
 Cough x 4-5 days, worse over last 3 days. No other symptoms. No fever. Has been taking mucinex, flonase.

## 2023-06-22 NOTE — Telephone Encounter (Signed)
 Attempted call to patient daughter- phone rang without answer could not leave a voice mail message.

## 2023-06-24 NOTE — Telephone Encounter (Signed)
 Spoke to patient wife was seen at urgent car Did Cxray - no pneumonia but did have bronchitis Started on 5 day abx and prednisone  Patient has improved but still has slight cough with drainage Patient's wife has re-started him on allegra and mucinex

## 2023-06-24 NOTE — Telephone Encounter (Signed)
 Correction to last notation - I had spoken with patient daughter as his wife had passed away recently.

## 2023-07-12 DIAGNOSIS — H52223 Regular astigmatism, bilateral: Secondary | ICD-10-CM | POA: Diagnosis not present

## 2023-07-12 DIAGNOSIS — H16223 Keratoconjunctivitis sicca, not specified as Sjogren's, bilateral: Secondary | ICD-10-CM | POA: Diagnosis not present

## 2023-07-12 DIAGNOSIS — H524 Presbyopia: Secondary | ICD-10-CM | POA: Diagnosis not present

## 2023-07-12 DIAGNOSIS — H5203 Hypermetropia, bilateral: Secondary | ICD-10-CM | POA: Diagnosis not present

## 2023-07-15 ENCOUNTER — Emergency Department (HOSPITAL_COMMUNITY)
Admission: EM | Admit: 2023-07-15 | Discharge: 2023-07-15 | Disposition: A | Discharge status: Home / Self Care | Attending: Emergency Medicine | Admitting: Emergency Medicine

## 2023-07-15 ENCOUNTER — Emergency Department (HOSPITAL_COMMUNITY)

## 2023-07-15 DIAGNOSIS — J432 Centrilobular emphysema: Secondary | ICD-10-CM | POA: Diagnosis not present

## 2023-07-15 DIAGNOSIS — W19XXXA Unspecified fall, initial encounter: Secondary | ICD-10-CM

## 2023-07-15 DIAGNOSIS — I4891 Unspecified atrial fibrillation: Secondary | ICD-10-CM | POA: Diagnosis not present

## 2023-07-15 DIAGNOSIS — S0081XA Abrasion of other part of head, initial encounter: Secondary | ICD-10-CM | POA: Insufficient documentation

## 2023-07-15 DIAGNOSIS — N189 Chronic kidney disease, unspecified: Secondary | ICD-10-CM | POA: Diagnosis not present

## 2023-07-15 DIAGNOSIS — I10 Essential (primary) hypertension: Secondary | ICD-10-CM | POA: Diagnosis not present

## 2023-07-15 DIAGNOSIS — R58 Hemorrhage, not elsewhere classified: Secondary | ICD-10-CM | POA: Diagnosis not present

## 2023-07-15 DIAGNOSIS — Y92002 Bathroom of unspecified non-institutional (private) residence single-family (private) house as the place of occurrence of the external cause: Secondary | ICD-10-CM | POA: Insufficient documentation

## 2023-07-15 DIAGNOSIS — S0003XA Contusion of scalp, initial encounter: Secondary | ICD-10-CM | POA: Diagnosis not present

## 2023-07-15 DIAGNOSIS — S0091XA Abrasion of unspecified part of head, initial encounter: Secondary | ICD-10-CM

## 2023-07-15 DIAGNOSIS — J449 Chronic obstructive pulmonary disease, unspecified: Secondary | ICD-10-CM | POA: Insufficient documentation

## 2023-07-15 DIAGNOSIS — S0990XA Unspecified injury of head, initial encounter: Secondary | ICD-10-CM

## 2023-07-15 DIAGNOSIS — Z7901 Long term (current) use of anticoagulants: Secondary | ICD-10-CM | POA: Diagnosis not present

## 2023-07-15 DIAGNOSIS — S199XXA Unspecified injury of neck, initial encounter: Secondary | ICD-10-CM | POA: Diagnosis not present

## 2023-07-15 DIAGNOSIS — W2209XA Striking against other stationary object, initial encounter: Secondary | ICD-10-CM | POA: Insufficient documentation

## 2023-07-15 DIAGNOSIS — I129 Hypertensive chronic kidney disease with stage 1 through stage 4 chronic kidney disease, or unspecified chronic kidney disease: Secondary | ICD-10-CM | POA: Diagnosis not present

## 2023-07-15 LAB — I-STAT CHEM 8, ED
BUN: 31 mg/dL — ABNORMAL HIGH (ref 8–23)
Calcium, Ion: 1.04 mmol/L — ABNORMAL LOW (ref 1.15–1.40)
Chloride: 100 mmol/L (ref 98–111)
Creatinine, Ser: 1.7 mg/dL — ABNORMAL HIGH (ref 0.61–1.24)
Glucose, Bld: 105 mg/dL — ABNORMAL HIGH (ref 70–99)
HCT: 33 % — ABNORMAL LOW (ref 39.0–52.0)
Hemoglobin: 11.2 g/dL — ABNORMAL LOW (ref 13.0–17.0)
Potassium: 4.1 mmol/L (ref 3.5–5.1)
Sodium: 135 mmol/L (ref 135–145)
TCO2: 27 mmol/L (ref 22–32)

## 2023-07-15 NOTE — Discharge Instructions (Signed)
 You were seen in the emergency department today for head injury with a fall.  The only injury that was noted was to your scalp.  Head CT and neck CT were obtained that showed no evidence of acute injury to your brain or neck. You are likely to have ongoing oozing from the wound on your head due to your Eliquis  use.  Please keep pressure dressing in place with 4 x 4's and keep your head elevated.  This oozing may continue for several days. Return if you were lightheaded, chest pain, shortness of breath, or other new symptoms. Follow-up with your doctor in 1 to 6 days.

## 2023-07-15 NOTE — ED Notes (Signed)
Patient transported to CT via this RN.

## 2023-07-15 NOTE — ED Provider Notes (Signed)
 [place] [facility]GENC[facility] DEPARTMENT AT [facility] Provider Note   CSN: [id number] Arrival date & time: 07/15/23  0848     History  Chief Complaint  Patient presents with   Samuel Mcconnell    Samuel Mcconnell is a 89 y.o. male.  HPI 89 year old male history of paroxysmal A-fib, on chronic anticoagulation, hypertension, COPD, gait instability, LVH, CKD, presents today with fall.  Patient states that he was going to the bathroom to weigh himself.  He lost his balance and fell striking his head against the wall.  He was able to move himself into the living room and called for help.  EMS reports he has a laceration to the back of his head.  They did not know other injuries.  He was hemodynamically stable en route.  He denies loss of consciousness, chest pain, or syncope     Home Medications Prior to Admission medications   Medication Sig Start Date End Date Taking? Authorizing Provider  apixaban  (ELIQUIS ) 5 MG TABS tablet Take 1 tablet (5 mg total) by mouth 2 (two) times daily. 06/02/23  Yes Croitoru, Mihai, MD  acetaminophen  (TYLENOL ) 500 MG tablet Take 500 mg by mouth daily as needed for mild pain (pain score 1-3), moderate pain (pain score 4-6) or headache.    [provider]  albuterol  (VENTOLIN  HFA) 108 (90 Base) MCG/ACT inhaler Inhale 2 puffs into the lungs every 6 (six) hours as needed for wheezing or shortness of breath. 04/20/23   Adela Holter, DO  AMBULATORY NON FORMULARY MEDICATION Please provide toilet riser/elevator with arms.  Diagnosis: Generalized weakness R53.1 07/11/21   Adela Holter, DO  amiodarone  (PACERONE ) 200 MG tablet Take 1 tablet (200 mg total) by mouth daily. 03/19/23   Croitoru, Mihai, MD  azithromycin  (ZITHROMAX  Z-PAK) 250 MG tablet Take two pills today followed by one a day until gone 06/19/23   Stephany Ehrich, MD  Budeson-Glycopyrrol-Formoterol  (BREZTRI  AEROSPHERE) 160-9-4.8 MCG/ACT AERO Inhale 2 puffs into the lungs 2 (two) times daily. 05/11/23 08/09/23   Pokhrel, Amador Bad, MD  Cholecalciferol  (VITAMIN D3) 125 MCG (5000 UT) TABS Take 5,000 Units by mouth daily with lunch.    [provider]  diclofenac Sodium (VOLTAREN) 1 % GEL Apply 2 g topically 4 (four) times daily as needed (pain).    [provider]  diphenhydramine -acetaminophen  (TYLENOL  PM) 25-500 MG TABS tablet Take 1 tablet by mouth at bedtime as needed (for sleeplessness). Patient not taking: Reported on 07/19/2023    [provider]  fluticasone  (FLONASE ) 50 MCG/ACT nasal spray Place 2 sprays into both nostrils daily as needed for allergies or rhinitis. 05/27/23 08/25/23  Adela Holter, DO  furosemide  (LASIX ) 40 MG tablet Take 1 tablet (40 mg total) by mouth daily. 06/11/23 09/09/23  Croitoru, Mihai, MD  levothyroxine  (SYNTHROID ) 137 MCG tablet TAKE 1 TABLET EVERY DAY BEFORE BREAKFAST (NEED LABS AND MD APPOINTMENT) Patient taking differently: Take 137 mcg by mouth daily before breakfast. 07/28/22   Adela Holter, DO  Multiple Vitamin (MULTIVITAMIN) capsule Take 1 capsule by mouth daily with breakfast.    [provider]  NON FORMULARY Take 2 tablets by mouth See admin instructions. SuperBeets Heart Chews- Chew 2 tablets by mouth once a day    [provider]  potassium chloride  (KLOR-CON ) 10 MEQ tablet Take 1 tablet (10 mEq total) by mouth daily. 06/11/23 09/09/23  Croitoru, Mihai, MD  predniSONE  (DELTASONE ) 20 MG tablet Take 2 tablets (40 mg total) by mouth daily with breakfast. 06/19/23   Elfreda Grip  Bobbye Burrow, MD  rOPINIRole  (REQUIP ) 0.25 MG tablet Take 3 tablets (0.75 mg total) by mouth at bedtime. 05/25/23 08/23/23  Adela Holter, DO  triamcinolone  ointment (KENALOG ) 0.1 % Apply 1 Application topically 2 (two) times daily as needed (irritation).    [provider]  zinc gluconate 50 MG tablet Take 50 mg by mouth daily with lunch.    [provider]      Allergies    Clindamycin/lincomycin, Dabigatran etexilate mesylate, and Doxycycline      Review of Systems   Review of Systems  Physical Exam Updated Vital Signs BP (!) 143/73   Pulse (!) 55   Temp 98.3 F (36.8 C) (Oral)   Resp (!) 26   Ht 1.803 m (5\' 11" )   Wt 82.1 kg   SpO2 93%   BMI 25.24 kg/m  Physical Exam Vitals reviewed.  HENT:     Head: Normocephalic.     Comments: Dried blood on occiput Blood clean and abrasion and laceration that is not through the skin noted.  Patient continues to have some oozing.    Right Ear: External ear normal.     Left Ear: External ear normal.     Nose: Nose normal.     Mouth/Throat:     Pharynx: Oropharynx is clear.  Eyes:     Extraocular Movements: Extraocular movements intact.     Pupils: Pupils are equal, round, and reactive to light.  Cardiovascular:     Rate and Rhythm: Normal rate and regular rhythm.     Pulses: Normal pulses.  Pulmonary:     Effort: Pulmonary effort is normal.     Breath sounds: Normal breath sounds.  Abdominal:     General: Abdomen is flat. Bowel sounds are normal.     Palpations: Abdomen is soft.  Musculoskeletal:        General: Normal range of motion.     Cervical back: Normal range of motion. No tenderness.     Comments: Pelvis is stable Lower extremities both range through full active range of motion and no tenderness noted Upper extremities no obvious external signs of trauma or deformity Back exam with no signs of trauma Cervical, thoracic, lumbar spine palpated with no  tenderness noted  Neurological:     General: No focal deficit present.     Mental Status: He is alert.  Psychiatric:        Mood and Affect: Mood normal.     ED Results / Procedures / Treatments   Labs (all labs ordered are listed, but only abnormal results are displayed) Labs Reviewed  I-STAT CHEM 8, ED - Abnormal; Notable for the following components:      Result Value   BUN 31 (*)    Creatinine, Ser 1.70 (*)    Glucose, Bld 105 (*)    Calcium, Ion 1.04 (*)    Hemoglobin 11.2 (*)    HCT 33.0 (*)     All other components within normal limits    EKG EKG Interpretation Date/Time:  Thursday July 15 2023 08:59:50 EDT Ventricular Rate:  65 PR Interval:  236 QRS Duration:  165 QT Interval:  504 QTC Calculation: 525 R Axis:   -27  Text Interpretation: Sinus rhythm Atrial premature complex Prolonged PR interval Right bundle branch block since last tracing a fib has resoved to nsr Confirmed by Auston Blush [phone]) on 07/15/2023 9:09:29 AM  Radiology No results found.  Procedures Procedures    Medications Ordered in ED Medications - No data  to display  ED Course/ Medical Decision Making/ A&P Clinical Course as of 07/23/23 1057  Thu Jul 15, 2023  1032 Stat reviewed and interpreted with mild increase in creatinine from 1.7 with prior 1.5 Hemoglobin stable at 11 [DR]  1033 No CT evidence of acute intracranial abnormality or acute fracture or traumatic malalignment of the cervical spine [DR]    Clinical Course User Index [DR] Auston Blush, MD                                 Medical Decision Making Amount and/or Complexity of Data Reviewed Radiology: ordered.   89 year old male tree of paroxysmal A-fib, on chronic anticoagulation, COPD, hypertension, presents today after mechanical fall.  Patient gives good history of mechanical fall and seems to be appropriate historian.  He did strike his head but denies any loss of consciousness.  He denies any other injury and on exam has a laceration to the occiput that does not appear to need repair.  No other injuries were noted.  Patient was evaluated here with good hematocrit, sodium potassium chloride  CO2 BUN and creatinine and appears to be stable from prior. Continues to have bleeding from site on head.  Hemodynamically stable.  Patient on Eliquis .  Patient daughter advised that he should have pressure dressing in place and keep head elevated. CT of head and neck without any obvious trauma Patient appears stable for discharge back to  facility        Final Clinical Impression(s) / ED Diagnoses Final diagnoses:  Fall, initial encounter  Injury of head, initial encounter  Abrasion of head, initial encounter  Chronic anticoagulation    Rx / DC Orders ED Discharge Orders          Ordered    Ambulate with assistance        07/15/23 1037              Auston Blush, MD 07/23/23 1057

## 2023-07-15 NOTE — Progress Notes (Signed)
 Orthopedic Tech Progress Note Patient Details:  Samuel Mcconnell 08/01/1927 [id number]  Level II trauma, no ortho tech needs at this time.  Patient ID: Samuel Mcconnell, male   DOB: December 18, 1927, 89 y.o.   MRN: [id number]  Samuel Mcconnell 07/15/2023, 10:28 AM

## 2023-07-19 ENCOUNTER — Encounter: Payer: Self-pay | Admitting: Family Medicine

## 2023-07-19 ENCOUNTER — Ambulatory Visit (INDEPENDENT_AMBULATORY_CARE_PROVIDER_SITE_OTHER): Admitting: Family Medicine

## 2023-07-19 ENCOUNTER — Ambulatory Visit

## 2023-07-19 VITALS — BP 144/66 | HR 66 | Ht 71.0 in | Wt 182.0 lb

## 2023-07-19 DIAGNOSIS — W19XXXA Unspecified fall, initial encounter: Secondary | ICD-10-CM | POA: Diagnosis not present

## 2023-07-19 DIAGNOSIS — M6281 Muscle weakness (generalized): Secondary | ICD-10-CM

## 2023-07-19 DIAGNOSIS — M25522 Pain in left elbow: Secondary | ICD-10-CM | POA: Insufficient documentation

## 2023-07-19 DIAGNOSIS — S0101XA Laceration without foreign body of scalp, initial encounter: Secondary | ICD-10-CM | POA: Diagnosis not present

## 2023-07-19 DIAGNOSIS — R2689 Other abnormalities of gait and mobility: Secondary | ICD-10-CM

## 2023-07-19 DIAGNOSIS — Z043 Encounter for examination and observation following other accident: Secondary | ICD-10-CM | POA: Diagnosis not present

## 2023-07-19 NOTE — Progress Notes (Signed)
 Samuel Mcconnell - 89 y.o. male MRN [id number]  Date of birth: 1927-10-09  Subjective Chief Complaint  Patient presents with   Fall    HPI Samuel Mcconnell is a 89 y.o. male here today for follow up of recent ED visit.   He had a fall last week after he lost his balance going to the bathroom to weigh himself.  He did strike his head against the wall causing a laceration to the back of scalp.  Also hit his L arm.  There was no LOC.  CT head and neck were negative for intracranial bleed or fracture.  Laceration was not repaired as there was not enough skin for approximation.  Does not appear to have any continued bleeding at this point.  Denies any confusion or neurological changes.  He is having some pain along the elbow as well as some bruising.  This was not x-rayed today ED.  H he has has recurrent falls which he attributes to knee pain causing weakness in his legs.  He has had improvement with physical therapy in the past and would like to try adding this again.    ROS:  A comprehensive ROS was completed and negative except as noted per HPI  Allergies  Allergen Reactions   Clindamycin/Lincomycin Other (See Comments)    Patient "felt weird"    Dabigatran Etexilate Mesylate Other (See Comments)    "felt weird" (name brand Pradaxa)   Doxycycline  Other (See Comments)    Dyspepsia    Past Medical History:  Diagnosis Date   Arthritis    Atrial fibrillation (HCC)    radiofrequency ablation   Cataract    CHF (congestive heart failure) (HCC)    Clotting disorder (HCC)    due to medication   Emphysema    Emphysema of lung (HCC)    Hypertension    Thyroid  disease    Ulcer    GI years prior    Past Surgical History:  Procedure Laterality Date   CARDIAC SURGERY     CARDIOVERSION N/A 10/24/2019   Procedure: CARDIOVERSION;  Surgeon: Luana Rumple, MD;  Location: [facility] ENDOSCOPY;  Service: Cardiovascular;  Laterality: N/A;   CARDIOVERSION N/A 10/22/2020   Procedure: CARDIOVERSION;   Surgeon: Lake Pilgrim, MD;  Location: [facility] ENDOSCOPY;  Service: Cardiovascular;  Laterality: N/A;   COLON SURGERY  03/23/1997   EYE SURGERY     KNEE ARTHROSCOPY Left 07/21/2000   LOOP RECORDER IMPLANT  01/16/2010   Medtronic Reveal XT (Dr. Tammy Fallen)    RADIOFREQUENCY ABLATION  06/13/2009   SHOULDER ARTHROSCOPY WITH ROTATOR CUFF REPAIR Left 04/23/1996   TIBIAL TUBERCLERPLASTY  ~ 10/2010   TRANSTHORACIC ECHOCARDIOGRAM  11/19/2011   EF=>55%; mild MR, mild mitral annular calcif; mild TR, normal RSVP; AV mildly sclerotic, mild AV regurg; trace pulm valve regurg     Social History   Socioeconomic History   Marital status: Married    Spouse name: Elinor Guardian   Number of children: 2   Years of education: 16   Highest education level: Bachelor's degree (e.g., BA, AB, BS)  Occupational History   Occupation: Retired  Tobacco Use   Smoking status: Former    Current packs/day: 0.00    Types: Cigarettes    Start date: 03/14/1956    Quit date: 03/14/2002    Years since quitting: 21.3   Smokeless tobacco: Never   Tobacco comments:    Former smoker 01/23/2021  Vaping Use   Vaping status: Never Used  Substance and Sexual Activity  Alcohol use: Yes    Alcohol/week: 1.0 standard drink of alcohol    Types: 1 Glasses of wine per week   Drug use: No   Sexual activity: Not Currently    Partners: Female    Birth control/protection: None  Other Topics Concern   Not on file  Social History Narrative   Lives at [employer] with his wife. He is still driving. He enjoys walking and going to the gym.   Social Drivers of Corporate investment banker Strain: Low Risk  (03/30/2023)   Overall Financial Resource Strain (CARDIA)    Difficulty of Paying Living Expenses: Not hard at all  Food Insecurity: No Food Insecurity (05/12/2023)   Hunger Vital Sign    Worried About Running Out of Food in the Last Year: Never true    Ran Out of Food in the Last Year: Never true  Transportation Needs: No  Transportation Needs (05/12/2023)   PRAPARE - Administrator, Civil Service (Medical): No    Lack of Transportation (Non-Medical): No  Physical Activity: Inactive (03/30/2023)   Exercise Vital Sign    Days of Exercise per Week: 0 days    Minutes of Exercise per Session: 20 min  Stress: No Stress Concern Present (03/30/2023)   [employer] of Occupational Health - Occupational Stress Questionnaire    Feeling of Stress : Only a little  Social Connections: Socially Integrated (05/09/2023)   Social Connection and Isolation Panel [NHANES]    Frequency of Communication with Friends and Family: More than three times a week    Frequency of Social Gatherings with Friends and Family: More than three times a week    Attends Religious Services: 1 to 4 times per year    Active Member of [employer] or Organizations: Yes    Attends Banker Meetings: Never    Marital Status: Married    Family History  Problem Relation Age of Onset   Heart failure Mother    Heart failure Father        MI - died @ 93   Atrial fibrillation Sister    Hypertension Sister    Heart Problems Sister    Diabetes Sister    Heart failure Sister    Atrial fibrillation Brother        dx'ed age 54   Atrial fibrillation Brother        dx'ed age 85   Heart failure Brother    Diabetes Son    Pancreatitis Son        also ETOH abuse    Cancer Maternal Grandmother    Heart disease Maternal Grandfather     Health Maintenance  Topic Date Due   COVID-19 Vaccine (7 - Pfizer risk 2024-25 season) 06/10/2023   INFLUENZA VACCINE  10/22/2023   Medicare Annual Wellness (AWV)  11/25/2023   DTaP/Tdap/Td (3 - Td or Tdap) 09/06/2029   Pneumonia Vaccine 36+ Years old  Completed   Zoster Vaccines- Shingrix  Completed   HPV VACCINES  Aged Out   Meningococcal B Vaccine  Aged Out      ----------------------------------------------------------------------------------------------------------------------------------------------------------------------------------------------------------------- Physical Exam BP (!) 144/66 (BP Location: Right Arm, Patient Position: Sitting, Cuff Size: Large)   Pulse 66   Ht 5\' 11"  (1.803 m)   Wt 182 lb (82.6 kg)   SpO2 96%   BMI 25.38 kg/m   Physical Exam Constitutional:      Appearance: Normal appearance.  HENT:     Head: Normocephalic and atraumatic.  Eyes:     General: No scleral icterus. Cardiovascular:     Rate and Rhythm: Normal rate and regular rhythm.  Pulmonary:     Effort: Pulmonary effort is normal.     Breath sounds: Normal breath sounds.  Musculoskeletal:     Comments: Tenderness to palpation over the proximal ulna and elbow.  Bruising along the forearm.  Skin:    Comments: Hematoma on scalp with adjacent laceration.  No bleeding noted.  Neurological:     General: No focal deficit present.     Mental Status: He is alert.  Psychiatric:        Mood and Affect: Mood normal.        Behavior: Behavior normal.     ------------------------------------------------------------------------------------------------------------------------------------------------------------------------------------------------------------------- Assessment and Plan  Fall Unfortunately has had recurrent falls.  Adding home health physical therapy to help with strengthening and balance.  May benefit from repeat knee injection in the future as well.  Scalp laceration Appears to be healing well no bleeding or signs of infection at this time.  Red flags reviewed.  Left elbow pain X-rays of left elbow ordered.   No orders of the defined types were placed in this encounter.   No follow-ups on file.

## 2023-07-19 NOTE — Assessment & Plan Note (Signed)
 Unfortunately has had recurrent falls.  Adding home health physical therapy to help with strengthening and balance.  May benefit from repeat knee injection in the future as well.

## 2023-07-19 NOTE — Assessment & Plan Note (Signed)
 Appears to be healing well no bleeding or signs of infection at this time.  Red flags reviewed.

## 2023-07-19 NOTE — Assessment & Plan Note (Signed)
 X-rays of left elbow ordered.

## 2023-07-21 DIAGNOSIS — I130 Hypertensive heart and chronic kidney disease with heart failure and stage 1 through stage 4 chronic kidney disease, or unspecified chronic kidney disease: Secondary | ICD-10-CM | POA: Diagnosis not present

## 2023-07-21 DIAGNOSIS — S0101XD Laceration without foreign body of scalp, subsequent encounter: Secondary | ICD-10-CM | POA: Diagnosis not present

## 2023-07-21 DIAGNOSIS — M1990 Unspecified osteoarthritis, unspecified site: Secondary | ICD-10-CM | POA: Diagnosis not present

## 2023-07-21 DIAGNOSIS — I480 Paroxysmal atrial fibrillation: Secondary | ICD-10-CM | POA: Diagnosis not present

## 2023-07-21 DIAGNOSIS — J439 Emphysema, unspecified: Secondary | ICD-10-CM | POA: Diagnosis not present

## 2023-07-21 DIAGNOSIS — J449 Chronic obstructive pulmonary disease, unspecified: Secondary | ICD-10-CM | POA: Diagnosis not present

## 2023-07-21 DIAGNOSIS — I5030 Unspecified diastolic (congestive) heart failure: Secondary | ICD-10-CM | POA: Diagnosis not present

## 2023-07-21 DIAGNOSIS — J9601 Acute respiratory failure with hypoxia: Secondary | ICD-10-CM | POA: Diagnosis not present

## 2023-07-21 DIAGNOSIS — N1831 Chronic kidney disease, stage 3a: Secondary | ICD-10-CM | POA: Diagnosis not present

## 2023-07-22 ENCOUNTER — Encounter: Payer: Self-pay | Admitting: Family Medicine

## 2023-07-26 ENCOUNTER — Telehealth: Payer: Self-pay

## 2023-07-26 NOTE — Telephone Encounter (Signed)
 Copied from CRM [id number]. Topic: Clinical - Medical Advice >> Jul 26, 2023 12:58 PM Kevelyn M wrote: Reason for CRM: Katelyn with [facility] called to see if we received a fax regarding the plan of care order for Physical Therapy. [phone] ex [phone]. [phone]

## 2023-07-26 NOTE — Telephone Encounter (Signed)
 Documentation received by Dr. Augustus Ledger.

## 2023-07-30 DIAGNOSIS — S0101XD Laceration without foreign body of scalp, subsequent encounter: Secondary | ICD-10-CM | POA: Diagnosis not present

## 2023-07-30 DIAGNOSIS — I130 Hypertensive heart and chronic kidney disease with heart failure and stage 1 through stage 4 chronic kidney disease, or unspecified chronic kidney disease: Secondary | ICD-10-CM | POA: Diagnosis not present

## 2023-07-30 DIAGNOSIS — J9601 Acute respiratory failure with hypoxia: Secondary | ICD-10-CM | POA: Diagnosis not present

## 2023-07-30 DIAGNOSIS — J439 Emphysema, unspecified: Secondary | ICD-10-CM | POA: Diagnosis not present

## 2023-07-30 DIAGNOSIS — I5030 Unspecified diastolic (congestive) heart failure: Secondary | ICD-10-CM | POA: Diagnosis not present

## 2023-07-30 DIAGNOSIS — N1831 Chronic kidney disease, stage 3a: Secondary | ICD-10-CM | POA: Diagnosis not present

## 2023-07-30 DIAGNOSIS — J449 Chronic obstructive pulmonary disease, unspecified: Secondary | ICD-10-CM | POA: Diagnosis not present

## 2023-07-30 DIAGNOSIS — I480 Paroxysmal atrial fibrillation: Secondary | ICD-10-CM | POA: Diagnosis not present

## 2023-07-30 DIAGNOSIS — M1990 Unspecified osteoarthritis, unspecified site: Secondary | ICD-10-CM | POA: Diagnosis not present

## 2023-08-03 DIAGNOSIS — I5030 Unspecified diastolic (congestive) heart failure: Secondary | ICD-10-CM | POA: Diagnosis not present

## 2023-08-03 DIAGNOSIS — S0101XD Laceration without foreign body of scalp, subsequent encounter: Secondary | ICD-10-CM | POA: Diagnosis not present

## 2023-08-03 DIAGNOSIS — J9601 Acute respiratory failure with hypoxia: Secondary | ICD-10-CM | POA: Diagnosis not present

## 2023-08-03 DIAGNOSIS — I130 Hypertensive heart and chronic kidney disease with heart failure and stage 1 through stage 4 chronic kidney disease, or unspecified chronic kidney disease: Secondary | ICD-10-CM | POA: Diagnosis not present

## 2023-08-03 DIAGNOSIS — M1990 Unspecified osteoarthritis, unspecified site: Secondary | ICD-10-CM | POA: Diagnosis not present

## 2023-08-03 DIAGNOSIS — J449 Chronic obstructive pulmonary disease, unspecified: Secondary | ICD-10-CM | POA: Diagnosis not present

## 2023-08-03 DIAGNOSIS — J439 Emphysema, unspecified: Secondary | ICD-10-CM | POA: Diagnosis not present

## 2023-08-03 DIAGNOSIS — I480 Paroxysmal atrial fibrillation: Secondary | ICD-10-CM | POA: Diagnosis not present

## 2023-08-03 DIAGNOSIS — N1831 Chronic kidney disease, stage 3a: Secondary | ICD-10-CM | POA: Diagnosis not present

## 2023-08-04 DIAGNOSIS — I130 Hypertensive heart and chronic kidney disease with heart failure and stage 1 through stage 4 chronic kidney disease, or unspecified chronic kidney disease: Secondary | ICD-10-CM | POA: Diagnosis not present

## 2023-08-04 DIAGNOSIS — S0101XD Laceration without foreign body of scalp, subsequent encounter: Secondary | ICD-10-CM | POA: Diagnosis not present

## 2023-08-04 DIAGNOSIS — J9601 Acute respiratory failure with hypoxia: Secondary | ICD-10-CM | POA: Diagnosis not present

## 2023-08-04 DIAGNOSIS — M1990 Unspecified osteoarthritis, unspecified site: Secondary | ICD-10-CM | POA: Diagnosis not present

## 2023-08-04 DIAGNOSIS — I480 Paroxysmal atrial fibrillation: Secondary | ICD-10-CM | POA: Diagnosis not present

## 2023-08-04 DIAGNOSIS — J439 Emphysema, unspecified: Secondary | ICD-10-CM | POA: Diagnosis not present

## 2023-08-04 DIAGNOSIS — N1831 Chronic kidney disease, stage 3a: Secondary | ICD-10-CM | POA: Diagnosis not present

## 2023-08-04 DIAGNOSIS — J449 Chronic obstructive pulmonary disease, unspecified: Secondary | ICD-10-CM | POA: Diagnosis not present

## 2023-08-04 DIAGNOSIS — I5030 Unspecified diastolic (congestive) heart failure: Secondary | ICD-10-CM | POA: Diagnosis not present

## 2023-08-11 DIAGNOSIS — J9601 Acute respiratory failure with hypoxia: Secondary | ICD-10-CM | POA: Diagnosis not present

## 2023-08-11 DIAGNOSIS — N1831 Chronic kidney disease, stage 3a: Secondary | ICD-10-CM | POA: Diagnosis not present

## 2023-08-11 DIAGNOSIS — J449 Chronic obstructive pulmonary disease, unspecified: Secondary | ICD-10-CM | POA: Diagnosis not present

## 2023-08-11 DIAGNOSIS — J439 Emphysema, unspecified: Secondary | ICD-10-CM | POA: Diagnosis not present

## 2023-08-11 DIAGNOSIS — S0101XD Laceration without foreign body of scalp, subsequent encounter: Secondary | ICD-10-CM | POA: Diagnosis not present

## 2023-08-11 DIAGNOSIS — I130 Hypertensive heart and chronic kidney disease with heart failure and stage 1 through stage 4 chronic kidney disease, or unspecified chronic kidney disease: Secondary | ICD-10-CM | POA: Diagnosis not present

## 2023-08-11 DIAGNOSIS — M1990 Unspecified osteoarthritis, unspecified site: Secondary | ICD-10-CM | POA: Diagnosis not present

## 2023-08-11 DIAGNOSIS — I480 Paroxysmal atrial fibrillation: Secondary | ICD-10-CM | POA: Diagnosis not present

## 2023-08-11 DIAGNOSIS — I5030 Unspecified diastolic (congestive) heart failure: Secondary | ICD-10-CM | POA: Diagnosis not present

## 2023-08-13 ENCOUNTER — Ambulatory Visit

## 2023-08-13 ENCOUNTER — Ambulatory Visit (INDEPENDENT_AMBULATORY_CARE_PROVIDER_SITE_OTHER): Admitting: Sports Medicine

## 2023-08-13 ENCOUNTER — Other Ambulatory Visit (INDEPENDENT_AMBULATORY_CARE_PROVIDER_SITE_OTHER)

## 2023-08-13 DIAGNOSIS — M1712 Unilateral primary osteoarthritis, left knee: Secondary | ICD-10-CM | POA: Diagnosis not present

## 2023-08-13 DIAGNOSIS — M170 Bilateral primary osteoarthritis of knee: Secondary | ICD-10-CM

## 2023-08-13 DIAGNOSIS — M25461 Effusion, right knee: Secondary | ICD-10-CM | POA: Diagnosis not present

## 2023-08-13 DIAGNOSIS — M25562 Pain in left knee: Secondary | ICD-10-CM | POA: Diagnosis not present

## 2023-08-13 DIAGNOSIS — M25561 Pain in right knee: Secondary | ICD-10-CM | POA: Diagnosis not present

## 2023-08-13 MED ORDER — TRIAMCINOLONE ACETONIDE 40 MG/ML IJ SUSP
80.0000 mg | Freq: Once | INTRAMUSCULAR | Status: AC
  Administered 2023-08-13: 80 mg via INTRA_ARTICULAR

## 2023-08-13 NOTE — Assessment & Plan Note (Signed)
 This is a very pleasant 89 year old male, he has known bilateral knee osteoarthritis, he has not had an injection in many years, he has had some falls recently and is currently in physical therapy at home for his knees. He does have visible and palpable effusions, pain and tenderness, he is requesting interventional treatment today, today we did bilateral aspirations and injections, he will get x-rays today, we will do knee sleeves, continue home PT, return to see me in 6 weeks, will consider viscosupplementation if not better. We also discussed geniculate artery embolization today.

## 2023-08-13 NOTE — Progress Notes (Signed)
    Procedures performed today:    Procedure: Real-time Ultrasound Guided aspiration/injection of left knee Device: Samsung HS60  Verbal informed consent obtained.  Time-out conducted.  Noted no overlying erythema, induration, or other signs of local infection.  Skin prepped in a sterile fashion.  Local anesthesia: Topical Ethyl chloride.  With sterile technique and under real time ultrasound guidance: Noted effusion, aspirated 32 mL of clear, straw-colored fluid, syringe switched and 1 cc Kenalog 40, 2 cc lidocaine , 2 cc bupivacaine injected easily Completed without difficulty  Advised to call if fevers/chills, erythema, induration, drainage, or persistent bleeding.  Images permanently stored and available for review in PACS.  Impression: Technically successful ultrasound guided injection.  Procedure: Real-time Ultrasound Guided aspiration/injection of right knee Device: Samsung HS60  Verbal informed consent obtained.  Time-out conducted.  Noted no overlying erythema, induration, or other signs of local infection.  Skin prepped in a sterile fashion.  Local anesthesia: Topical Ethyl chloride.  With sterile technique and under real time ultrasound guidance: Noted effusion, aspirated 24 mL of clear, straw-colored fluid, syringe switched and 1 cc Kenalog 40, 2 cc lidocaine , 2 cc bupivacaine injected easily Completed without difficulty  Advised to call if fevers/chills, erythema, induration, drainage, or persistent bleeding.  Images permanently stored and available for review in PACS.  Impression: Technically successful ultrasound guided injection.  Independent interpretation of notes and tests performed by another provider:   None.  Brief History, Exam, Impression, and Recommendations:    Primary osteoarthritis of both knees This is a very pleasant 89 year old male, he has known bilateral knee osteoarthritis, he has not had an injection in many years, he has had some falls recently  and is currently in physical therapy at home for his knees. He does have visible and palpable effusions, pain and tenderness, he is requesting interventional treatment today, today we did bilateral aspirations and injections, he will get x-rays today, we will do knee sleeves, continue home PT, return to see me in 6 weeks, will consider viscosupplementation if not better. We also discussed geniculate artery embolization today.    ____________________________________________ Joselyn Nicely. Sandy Crumb, M.D., ABFM., CAQSM., AME. Primary Care and Sports Medicine [facility]  Adjunct Professor of [facility] Medicine  [employer]  Restaurant manager, fast food

## 2023-08-13 NOTE — Patient Instructions (Signed)
 Samuel Mcconnell

## 2023-08-13 NOTE — Addendum Note (Signed)
 Addended by: Montgomery Apgar on: 08/13/2023 10:54 AM   Modules accepted: Orders

## 2023-08-18 DIAGNOSIS — I130 Hypertensive heart and chronic kidney disease with heart failure and stage 1 through stage 4 chronic kidney disease, or unspecified chronic kidney disease: Secondary | ICD-10-CM | POA: Diagnosis not present

## 2023-08-18 DIAGNOSIS — J449 Chronic obstructive pulmonary disease, unspecified: Secondary | ICD-10-CM | POA: Diagnosis not present

## 2023-08-18 DIAGNOSIS — J439 Emphysema, unspecified: Secondary | ICD-10-CM | POA: Diagnosis not present

## 2023-08-18 DIAGNOSIS — I5030 Unspecified diastolic (congestive) heart failure: Secondary | ICD-10-CM | POA: Diagnosis not present

## 2023-08-18 DIAGNOSIS — J9601 Acute respiratory failure with hypoxia: Secondary | ICD-10-CM | POA: Diagnosis not present

## 2023-08-18 DIAGNOSIS — S0101XD Laceration without foreign body of scalp, subsequent encounter: Secondary | ICD-10-CM | POA: Diagnosis not present

## 2023-08-18 DIAGNOSIS — I480 Paroxysmal atrial fibrillation: Secondary | ICD-10-CM | POA: Diagnosis not present

## 2023-08-18 DIAGNOSIS — M1990 Unspecified osteoarthritis, unspecified site: Secondary | ICD-10-CM | POA: Diagnosis not present

## 2023-08-18 DIAGNOSIS — N1831 Chronic kidney disease, stage 3a: Secondary | ICD-10-CM | POA: Diagnosis not present

## 2023-08-19 NOTE — Telephone Encounter (Signed)
 Copied from CRM [id number]. Topic: Clinical - Home Health Verbal Orders >> Aug 19, 2023  1:01 PM Kevelyn M wrote: Caller/Agency: Kim/[facility] home health Callback Number: [phone] ext [phone] Service Requested: Physical Therapy Frequency: Grab bars need to be installed Any new concerns about the patient? Yes, /Order summary needs to be signed to process the delivery

## 2023-08-19 NOTE — Telephone Encounter (Signed)
 Task completed. Contacted Kim at [facility]. No answer, left a detailed message regarding the verbal order to install grab bars. Direct call back info provided.

## 2023-08-20 DIAGNOSIS — J439 Emphysema, unspecified: Secondary | ICD-10-CM | POA: Diagnosis not present

## 2023-08-20 DIAGNOSIS — J9601 Acute respiratory failure with hypoxia: Secondary | ICD-10-CM | POA: Diagnosis not present

## 2023-08-20 DIAGNOSIS — M1990 Unspecified osteoarthritis, unspecified site: Secondary | ICD-10-CM | POA: Diagnosis not present

## 2023-08-20 DIAGNOSIS — I480 Paroxysmal atrial fibrillation: Secondary | ICD-10-CM | POA: Diagnosis not present

## 2023-08-20 DIAGNOSIS — I130 Hypertensive heart and chronic kidney disease with heart failure and stage 1 through stage 4 chronic kidney disease, or unspecified chronic kidney disease: Secondary | ICD-10-CM | POA: Diagnosis not present

## 2023-08-20 DIAGNOSIS — J449 Chronic obstructive pulmonary disease, unspecified: Secondary | ICD-10-CM | POA: Diagnosis not present

## 2023-08-20 DIAGNOSIS — I5030 Unspecified diastolic (congestive) heart failure: Secondary | ICD-10-CM | POA: Diagnosis not present

## 2023-08-20 DIAGNOSIS — S0101XD Laceration without foreign body of scalp, subsequent encounter: Secondary | ICD-10-CM | POA: Diagnosis not present

## 2023-08-20 DIAGNOSIS — N1831 Chronic kidney disease, stage 3a: Secondary | ICD-10-CM | POA: Diagnosis not present

## 2023-08-23 ENCOUNTER — Encounter: Payer: Self-pay | Admitting: Family Medicine

## 2023-08-23 ENCOUNTER — Encounter: Payer: Self-pay | Admitting: Cardiovascular Disease

## 2023-08-23 DIAGNOSIS — E039 Hypothyroidism, unspecified: Secondary | ICD-10-CM

## 2023-08-23 DIAGNOSIS — I4819 Other persistent atrial fibrillation: Secondary | ICD-10-CM

## 2023-08-23 DIAGNOSIS — J309 Allergic rhinitis, unspecified: Secondary | ICD-10-CM

## 2023-08-23 MED ORDER — ALBUTEROL SULFATE HFA 108 (90 BASE) MCG/ACT IN AERS: 2.0000 | INHALATION_SPRAY | Freq: Four times a day (QID) | RESPIRATORY_TRACT | 1 refills | Status: AC | PRN

## 2023-08-23 MED ORDER — LEVOTHYROXINE SODIUM 137 MCG PO TABS: 137.0000 ug | ORAL_TABLET | Freq: Every day | ORAL | 0 refills | Status: AC

## 2023-08-23 MED ORDER — ROPINIROLE HCL 0.25 MG PO TABS: 0.7500 mg | ORAL_TABLET | Freq: Every day | ORAL | 0 refills | Status: DC

## 2023-08-23 MED ORDER — FLUTICASONE PROPIONATE 50 MCG/ACT NA SUSP
2.0000 | Freq: Every day | NASAL | 0 refills | Status: AC | PRN
Start: 2023-08-23 — End: 2024-01-11

## 2023-08-23 NOTE — Telephone Encounter (Signed)
 Forwarding message to Cherre Cornish covering Dr. Augustus Ledger Requesting all medications prescribed by Dr. Augustus Ledger be sent to [facility] pharmacy  Only one medications shown prescribed by Dr. Augustus Ledger that was not sent to [facility] pharmacy  Levothyroxine  - last filled 07/28/2022 Last OV 07/19/2023 Upcoming appt 09/23/2023 dr. Sandy Crumb And 11/30/23 AWV

## 2023-08-24 DIAGNOSIS — M1990 Unspecified osteoarthritis, unspecified site: Secondary | ICD-10-CM | POA: Diagnosis not present

## 2023-08-24 DIAGNOSIS — I130 Hypertensive heart and chronic kidney disease with heart failure and stage 1 through stage 4 chronic kidney disease, or unspecified chronic kidney disease: Secondary | ICD-10-CM | POA: Diagnosis not present

## 2023-08-24 DIAGNOSIS — J9601 Acute respiratory failure with hypoxia: Secondary | ICD-10-CM | POA: Diagnosis not present

## 2023-08-24 DIAGNOSIS — N1831 Chronic kidney disease, stage 3a: Secondary | ICD-10-CM | POA: Diagnosis not present

## 2023-08-24 DIAGNOSIS — I480 Paroxysmal atrial fibrillation: Secondary | ICD-10-CM | POA: Diagnosis not present

## 2023-08-24 DIAGNOSIS — J439 Emphysema, unspecified: Secondary | ICD-10-CM | POA: Diagnosis not present

## 2023-08-24 DIAGNOSIS — I5030 Unspecified diastolic (congestive) heart failure: Secondary | ICD-10-CM | POA: Diagnosis not present

## 2023-08-24 DIAGNOSIS — J449 Chronic obstructive pulmonary disease, unspecified: Secondary | ICD-10-CM | POA: Diagnosis not present

## 2023-08-24 DIAGNOSIS — S0101XD Laceration without foreign body of scalp, subsequent encounter: Secondary | ICD-10-CM | POA: Diagnosis not present

## 2023-08-24 MED ORDER — APIXABAN 2.5 MG PO TABS: 2.5000 mg | ORAL_TABLET | Freq: Two times a day (BID) | ORAL | 0 refills | Status: DC

## 2023-08-25 ENCOUNTER — Telehealth: Payer: Self-pay | Admitting: Cardiovascular Disease

## 2023-08-25 DIAGNOSIS — S0101XD Laceration without foreign body of scalp, subsequent encounter: Secondary | ICD-10-CM | POA: Diagnosis not present

## 2023-08-25 DIAGNOSIS — J449 Chronic obstructive pulmonary disease, unspecified: Secondary | ICD-10-CM | POA: Diagnosis not present

## 2023-08-25 DIAGNOSIS — I480 Paroxysmal atrial fibrillation: Secondary | ICD-10-CM | POA: Diagnosis not present

## 2023-08-25 DIAGNOSIS — N1831 Chronic kidney disease, stage 3a: Secondary | ICD-10-CM | POA: Diagnosis not present

## 2023-08-25 DIAGNOSIS — I5030 Unspecified diastolic (congestive) heart failure: Secondary | ICD-10-CM | POA: Diagnosis not present

## 2023-08-25 DIAGNOSIS — J439 Emphysema, unspecified: Secondary | ICD-10-CM | POA: Diagnosis not present

## 2023-08-25 DIAGNOSIS — J9601 Acute respiratory failure with hypoxia: Secondary | ICD-10-CM | POA: Diagnosis not present

## 2023-08-25 DIAGNOSIS — I130 Hypertensive heart and chronic kidney disease with heart failure and stage 1 through stage 4 chronic kidney disease, or unspecified chronic kidney disease: Secondary | ICD-10-CM | POA: Diagnosis not present

## 2023-08-25 DIAGNOSIS — M1990 Unspecified osteoarthritis, unspecified site: Secondary | ICD-10-CM | POA: Diagnosis not present

## 2023-08-25 NOTE — Telephone Encounter (Signed)
 Pt c/o medication issue:  1. Name of Medication: apixaban  (ELIQUIS ) 2.5 MG TABS tablet   2. How are you currently taking this medication (dosage and times per day)? As written   3. Are you having a reaction (difficulty breathing--STAT)? No   4. What is your medication issue? Pts daughter would like to know why the dosages was changed from 5 MG to 2.5 MG

## 2023-08-25 NOTE — Telephone Encounter (Signed)
 Returned a call back to [place] (on [place]) about Eliquis  dose change recently made by our office.  Samuel Mcconnell states they requested a refill of the pts Eliquis  to be sent to [facility], and when they went to pick this up they noticed the dose had changed/reduced.   Informed Samuel Mcconnell that our anticoagulation nurses sent in the appropriate dose based off of patients current weight, age, and last renal function.  Samuel Mcconnell verbalized understanding and agrees with this plan.  Samuel Mcconnell was gracious for all the explanation and assistance with this matter.

## 2023-08-30 DIAGNOSIS — J439 Emphysema, unspecified: Secondary | ICD-10-CM | POA: Diagnosis not present

## 2023-08-30 DIAGNOSIS — M1990 Unspecified osteoarthritis, unspecified site: Secondary | ICD-10-CM | POA: Diagnosis not present

## 2023-08-30 DIAGNOSIS — I480 Paroxysmal atrial fibrillation: Secondary | ICD-10-CM | POA: Diagnosis not present

## 2023-08-30 DIAGNOSIS — I130 Hypertensive heart and chronic kidney disease with heart failure and stage 1 through stage 4 chronic kidney disease, or unspecified chronic kidney disease: Secondary | ICD-10-CM | POA: Diagnosis not present

## 2023-08-30 DIAGNOSIS — J449 Chronic obstructive pulmonary disease, unspecified: Secondary | ICD-10-CM | POA: Diagnosis not present

## 2023-08-30 DIAGNOSIS — N1831 Chronic kidney disease, stage 3a: Secondary | ICD-10-CM | POA: Diagnosis not present

## 2023-08-30 DIAGNOSIS — J9601 Acute respiratory failure with hypoxia: Secondary | ICD-10-CM | POA: Diagnosis not present

## 2023-08-30 DIAGNOSIS — I5030 Unspecified diastolic (congestive) heart failure: Secondary | ICD-10-CM | POA: Diagnosis not present

## 2023-08-30 DIAGNOSIS — S0101XD Laceration without foreign body of scalp, subsequent encounter: Secondary | ICD-10-CM | POA: Diagnosis not present

## 2023-09-01 DIAGNOSIS — I5030 Unspecified diastolic (congestive) heart failure: Secondary | ICD-10-CM | POA: Diagnosis not present

## 2023-09-01 DIAGNOSIS — J9601 Acute respiratory failure with hypoxia: Secondary | ICD-10-CM | POA: Diagnosis not present

## 2023-09-01 DIAGNOSIS — M1990 Unspecified osteoarthritis, unspecified site: Secondary | ICD-10-CM | POA: Diagnosis not present

## 2023-09-01 DIAGNOSIS — I480 Paroxysmal atrial fibrillation: Secondary | ICD-10-CM | POA: Diagnosis not present

## 2023-09-01 DIAGNOSIS — I130 Hypertensive heart and chronic kidney disease with heart failure and stage 1 through stage 4 chronic kidney disease, or unspecified chronic kidney disease: Secondary | ICD-10-CM | POA: Diagnosis not present

## 2023-09-01 DIAGNOSIS — N1831 Chronic kidney disease, stage 3a: Secondary | ICD-10-CM | POA: Diagnosis not present

## 2023-09-01 DIAGNOSIS — S0101XD Laceration without foreign body of scalp, subsequent encounter: Secondary | ICD-10-CM | POA: Diagnosis not present

## 2023-09-01 DIAGNOSIS — J449 Chronic obstructive pulmonary disease, unspecified: Secondary | ICD-10-CM | POA: Diagnosis not present

## 2023-09-01 DIAGNOSIS — J439 Emphysema, unspecified: Secondary | ICD-10-CM | POA: Diagnosis not present

## 2023-09-06 DIAGNOSIS — J9601 Acute respiratory failure with hypoxia: Secondary | ICD-10-CM | POA: Diagnosis not present

## 2023-09-06 DIAGNOSIS — I130 Hypertensive heart and chronic kidney disease with heart failure and stage 1 through stage 4 chronic kidney disease, or unspecified chronic kidney disease: Secondary | ICD-10-CM | POA: Diagnosis not present

## 2023-09-06 DIAGNOSIS — J439 Emphysema, unspecified: Secondary | ICD-10-CM | POA: Diagnosis not present

## 2023-09-06 DIAGNOSIS — S0101XD Laceration without foreign body of scalp, subsequent encounter: Secondary | ICD-10-CM | POA: Diagnosis not present

## 2023-09-06 DIAGNOSIS — I5030 Unspecified diastolic (congestive) heart failure: Secondary | ICD-10-CM | POA: Diagnosis not present

## 2023-09-06 DIAGNOSIS — M1990 Unspecified osteoarthritis, unspecified site: Secondary | ICD-10-CM | POA: Diagnosis not present

## 2023-09-06 DIAGNOSIS — J449 Chronic obstructive pulmonary disease, unspecified: Secondary | ICD-10-CM | POA: Diagnosis not present

## 2023-09-06 DIAGNOSIS — I480 Paroxysmal atrial fibrillation: Secondary | ICD-10-CM | POA: Diagnosis not present

## 2023-09-06 DIAGNOSIS — N1831 Chronic kidney disease, stage 3a: Secondary | ICD-10-CM | POA: Diagnosis not present

## 2023-09-10 DIAGNOSIS — I480 Paroxysmal atrial fibrillation: Secondary | ICD-10-CM | POA: Diagnosis not present

## 2023-09-10 DIAGNOSIS — I5030 Unspecified diastolic (congestive) heart failure: Secondary | ICD-10-CM | POA: Diagnosis not present

## 2023-09-10 DIAGNOSIS — J439 Emphysema, unspecified: Secondary | ICD-10-CM | POA: Diagnosis not present

## 2023-09-10 DIAGNOSIS — J9601 Acute respiratory failure with hypoxia: Secondary | ICD-10-CM | POA: Diagnosis not present

## 2023-09-10 DIAGNOSIS — S0101XD Laceration without foreign body of scalp, subsequent encounter: Secondary | ICD-10-CM | POA: Diagnosis not present

## 2023-09-10 DIAGNOSIS — J449 Chronic obstructive pulmonary disease, unspecified: Secondary | ICD-10-CM | POA: Diagnosis not present

## 2023-09-10 DIAGNOSIS — M1990 Unspecified osteoarthritis, unspecified site: Secondary | ICD-10-CM | POA: Diagnosis not present

## 2023-09-10 DIAGNOSIS — I130 Hypertensive heart and chronic kidney disease with heart failure and stage 1 through stage 4 chronic kidney disease, or unspecified chronic kidney disease: Secondary | ICD-10-CM | POA: Diagnosis not present

## 2023-09-10 DIAGNOSIS — N1831 Chronic kidney disease, stage 3a: Secondary | ICD-10-CM | POA: Diagnosis not present

## 2023-09-14 DIAGNOSIS — J9601 Acute respiratory failure with hypoxia: Secondary | ICD-10-CM | POA: Diagnosis not present

## 2023-09-14 DIAGNOSIS — J449 Chronic obstructive pulmonary disease, unspecified: Secondary | ICD-10-CM | POA: Diagnosis not present

## 2023-09-14 DIAGNOSIS — S0101XD Laceration without foreign body of scalp, subsequent encounter: Secondary | ICD-10-CM | POA: Diagnosis not present

## 2023-09-14 DIAGNOSIS — M1990 Unspecified osteoarthritis, unspecified site: Secondary | ICD-10-CM | POA: Diagnosis not present

## 2023-09-14 DIAGNOSIS — J439 Emphysema, unspecified: Secondary | ICD-10-CM | POA: Diagnosis not present

## 2023-09-14 DIAGNOSIS — N1831 Chronic kidney disease, stage 3a: Secondary | ICD-10-CM | POA: Diagnosis not present

## 2023-09-14 DIAGNOSIS — I130 Hypertensive heart and chronic kidney disease with heart failure and stage 1 through stage 4 chronic kidney disease, or unspecified chronic kidney disease: Secondary | ICD-10-CM | POA: Diagnosis not present

## 2023-09-14 DIAGNOSIS — I5030 Unspecified diastolic (congestive) heart failure: Secondary | ICD-10-CM | POA: Diagnosis not present

## 2023-09-14 DIAGNOSIS — I480 Paroxysmal atrial fibrillation: Secondary | ICD-10-CM | POA: Diagnosis not present

## 2023-09-22 DIAGNOSIS — J439 Emphysema, unspecified: Secondary | ICD-10-CM | POA: Diagnosis not present

## 2023-09-22 DIAGNOSIS — I130 Hypertensive heart and chronic kidney disease with heart failure and stage 1 through stage 4 chronic kidney disease, or unspecified chronic kidney disease: Secondary | ICD-10-CM | POA: Diagnosis not present

## 2023-09-22 DIAGNOSIS — I480 Paroxysmal atrial fibrillation: Secondary | ICD-10-CM | POA: Diagnosis not present

## 2023-09-22 DIAGNOSIS — N1831 Chronic kidney disease, stage 3a: Secondary | ICD-10-CM | POA: Diagnosis not present

## 2023-09-22 DIAGNOSIS — M1990 Unspecified osteoarthritis, unspecified site: Secondary | ICD-10-CM | POA: Diagnosis not present

## 2023-09-22 DIAGNOSIS — I5030 Unspecified diastolic (congestive) heart failure: Secondary | ICD-10-CM | POA: Diagnosis not present

## 2023-09-22 DIAGNOSIS — J449 Chronic obstructive pulmonary disease, unspecified: Secondary | ICD-10-CM | POA: Diagnosis not present

## 2023-09-22 DIAGNOSIS — E079 Disorder of thyroid, unspecified: Secondary | ICD-10-CM | POA: Diagnosis not present

## 2023-09-22 DIAGNOSIS — Z7951 Long term (current) use of inhaled steroids: Secondary | ICD-10-CM | POA: Diagnosis not present

## 2023-09-23 ENCOUNTER — Telehealth: Payer: Self-pay | Admitting: Sports Medicine

## 2023-09-23 ENCOUNTER — Ambulatory Visit (INDEPENDENT_AMBULATORY_CARE_PROVIDER_SITE_OTHER): Admitting: Sports Medicine

## 2023-09-23 DIAGNOSIS — L84 Corns and callosities: Secondary | ICD-10-CM | POA: Diagnosis not present

## 2023-09-23 DIAGNOSIS — M170 Bilateral primary osteoarthritis of knee: Secondary | ICD-10-CM | POA: Diagnosis not present

## 2023-09-23 MED ORDER — TRAMADOL HCL 50 MG PO TABS: 25.0000 mg | ORAL_TABLET | Freq: Three times a day (TID) | ORAL | 0 refills | Status: DC | PRN

## 2023-09-23 NOTE — Progress Notes (Signed)
    Procedures performed today:    None.  Independent interpretation of notes and tests performed by another provider:   None.  Brief History, Exam, Impression, and Recommendations:    Primary osteoarthritis of both knees This is a pleasant 89 year old male, known bilateral knee osteoarthritis, severe on x-rays. We did an injection, left knee is no longer hurting, right knee still has some pain. Mostly medial joint line and retropatellar. He is doing home health physical therapy at his assisted living facility. Due to persistent pain in the right knee we will add tramadol, he will increase his arthritis Tylenol  to 3 times daily, unable to use NSAIDs due to Eliquis . I would also like to get him approved for viscosupplementation. Return to see me start Visco when approved.  Corn of foot Painful spot right foot lateral aspect. On inspection he does have a corn, I have advised initial unloading with donut pads, I would also like consultation with podiatry, this can be excised surgically in the office.    ____________________________________________ Debby PARAS. Curtis, M.D., ABFM., CAQSM., AME. Primary Care and Sports Medicine [facility]  Adjunct Professor of [facility] Medicine  [employer]  Restaurant manager, fast food

## 2023-09-23 NOTE — Assessment & Plan Note (Signed)
 Painful spot right foot lateral aspect. On inspection he does have a corn, I have advised initial unloading with donut pads, I would also like consultation with podiatry, this can be excised surgically in the office.

## 2023-09-23 NOTE — Telephone Encounter (Signed)
 Visco approval please, bilateral x-ray confirmed osteoarthritis, failed physical therapy, analgesics, steroid injections.

## 2023-09-23 NOTE — Assessment & Plan Note (Signed)
 This is a pleasant 89 year old male, known bilateral knee osteoarthritis, severe on x-rays. We did an injection, left knee is no longer hurting, right knee still has some pain. Mostly medial joint line and retropatellar. He is doing home health physical therapy at his assisted living facility. Due to persistent pain in the right knee we will add tramadol, he will increase his arthritis Tylenol  to 3 times daily, unable to use NSAIDs due to Eliquis . I would also like to get him approved for viscosupplementation. Return to see me start Visco when approved.

## 2023-09-24 ENCOUNTER — Ambulatory Visit: Payer: Self-pay | Admitting: Cardiovascular Disease

## 2023-09-24 LAB — COMPREHENSIVE METABOLIC PANEL WITH GFR
ALT: 20 IU/L (ref 0–44)
AST: 28 IU/L (ref 0–40)
Albumin: 4 g/dL (ref 3.6–4.6)
Alkaline Phosphatase: 63 IU/L (ref 44–121)
BUN/Creatinine Ratio: 22 (ref 10–24)
BUN: 38 mg/dL — ABNORMAL HIGH (ref 10–36)
Bilirubin Total: 0.4 mg/dL (ref 0.0–1.2)
CO2: 23 mmol/L (ref 20–29)
Calcium: 9.6 mg/dL (ref 8.6–10.2)
Chloride: 104 mmol/L (ref 96–106)
Creatinine, Ser: 1.71 mg/dL — ABNORMAL HIGH (ref 0.76–1.27)
Globulin, Total: 2 g/dL (ref 1.5–4.5)
Glucose: 81 mg/dL (ref 70–99)
Potassium: 4.9 mmol/L (ref 3.5–5.2)
Sodium: 143 mmol/L (ref 134–144)
Total Protein: 6 g/dL (ref 6.0–8.5)
eGFR: 37 mL/min/1.73 — ABNORMAL LOW (ref >59)

## 2023-09-24 LAB — TSH: TSH: 2.67 u[IU]/mL (ref 0.450–4.500)

## 2023-09-29 DIAGNOSIS — J449 Chronic obstructive pulmonary disease, unspecified: Secondary | ICD-10-CM | POA: Diagnosis not present

## 2023-09-29 DIAGNOSIS — Z7951 Long term (current) use of inhaled steroids: Secondary | ICD-10-CM | POA: Diagnosis not present

## 2023-09-29 DIAGNOSIS — N1831 Chronic kidney disease, stage 3a: Secondary | ICD-10-CM | POA: Diagnosis not present

## 2023-09-29 DIAGNOSIS — I5030 Unspecified diastolic (congestive) heart failure: Secondary | ICD-10-CM | POA: Diagnosis not present

## 2023-09-29 DIAGNOSIS — I480 Paroxysmal atrial fibrillation: Secondary | ICD-10-CM | POA: Diagnosis not present

## 2023-09-29 DIAGNOSIS — M1990 Unspecified osteoarthritis, unspecified site: Secondary | ICD-10-CM | POA: Diagnosis not present

## 2023-09-29 DIAGNOSIS — E079 Disorder of thyroid, unspecified: Secondary | ICD-10-CM | POA: Diagnosis not present

## 2023-09-29 DIAGNOSIS — I130 Hypertensive heart and chronic kidney disease with heart failure and stage 1 through stage 4 chronic kidney disease, or unspecified chronic kidney disease: Secondary | ICD-10-CM | POA: Diagnosis not present

## 2023-09-29 DIAGNOSIS — J439 Emphysema, unspecified: Secondary | ICD-10-CM | POA: Diagnosis not present

## 2023-10-01 NOTE — Telephone Encounter (Signed)
 Benefits Investigation Details received from [facility] Injection: Orthovisc/Monovisc/Synvisc PA required: No May fill through: Buy and Bill OR Specialty Pharmacy OV Copay/Coinsurance: % Product Copay: % Administration Coinsurance: % Administration Copay: $ Deductible: $  Benefits Investigation Started

## 2023-10-05 DIAGNOSIS — N1831 Chronic kidney disease, stage 3a: Secondary | ICD-10-CM | POA: Diagnosis not present

## 2023-10-05 DIAGNOSIS — M1990 Unspecified osteoarthritis, unspecified site: Secondary | ICD-10-CM | POA: Diagnosis not present

## 2023-10-05 DIAGNOSIS — J439 Emphysema, unspecified: Secondary | ICD-10-CM | POA: Diagnosis not present

## 2023-10-05 DIAGNOSIS — Z7951 Long term (current) use of inhaled steroids: Secondary | ICD-10-CM | POA: Diagnosis not present

## 2023-10-05 DIAGNOSIS — I480 Paroxysmal atrial fibrillation: Secondary | ICD-10-CM | POA: Diagnosis not present

## 2023-10-05 DIAGNOSIS — J449 Chronic obstructive pulmonary disease, unspecified: Secondary | ICD-10-CM | POA: Diagnosis not present

## 2023-10-05 DIAGNOSIS — I130 Hypertensive heart and chronic kidney disease with heart failure and stage 1 through stage 4 chronic kidney disease, or unspecified chronic kidney disease: Secondary | ICD-10-CM | POA: Diagnosis not present

## 2023-10-05 DIAGNOSIS — E079 Disorder of thyroid, unspecified: Secondary | ICD-10-CM | POA: Diagnosis not present

## 2023-10-05 DIAGNOSIS — I5030 Unspecified diastolic (congestive) heart failure: Secondary | ICD-10-CM | POA: Diagnosis not present

## 2023-10-07 ENCOUNTER — Encounter: Payer: Self-pay | Admitting: Podiatry

## 2023-10-07 ENCOUNTER — Ambulatory Visit: Admitting: Podiatry

## 2023-10-07 DIAGNOSIS — L84 Corns and callosities: Secondary | ICD-10-CM

## 2023-10-07 DIAGNOSIS — M79675 Pain in left toe(s): Secondary | ICD-10-CM | POA: Diagnosis not present

## 2023-10-07 DIAGNOSIS — M79674 Pain in right toe(s): Secondary | ICD-10-CM

## 2023-10-07 DIAGNOSIS — Z7901 Long term (current) use of anticoagulants: Secondary | ICD-10-CM | POA: Diagnosis not present

## 2023-10-07 DIAGNOSIS — B351 Tinea unguium: Secondary | ICD-10-CM

## 2023-10-07 NOTE — Progress Notes (Signed)
  Subjective:  Patient ID: Samuel Mcconnell, male    DOB: 07-12-1927,   MRN: [id number]  Chief Complaint  Patient presents with   Callouses    He has a corn on the bottom, lateral side of his right foot.    89 y.o. male presents for concern of lesion on the lateral part of his right foot that has been present for a while and painful. Also  concern of thickened elongated and painful nails that are difficult to trim. Requesting to have them trimmed today. He is on eliquis  and at risk for foot care  PCP:  Alvia Bring, DO   . Denies any other pedal complaints. Denies n/v/f/c.   Past Medical History:  Diagnosis Date   Arthritis    Atrial fibrillation (HCC)    radiofrequency ablation   Cataract    CHF (congestive heart failure) (HCC)    Clotting disorder (HCC)    due to medication   Emphysema    Emphysema of lung (HCC)    Hypertension    Thyroid  disease    Ulcer    GI years prior    Objective:  Physical Exam: Vascular: DP/PT pulses 2/4 bilateral. CFT <3 seconds. Normal hair growth on digits. No edema.  Skin. No lacerations or abrasions bilateral feet. Hyperkeratotic cored lesion noted to lateral right foot . Nails 1-5 bilateral are thickened elongated and dystrophic with subungual debris. Small abrasion on right fourth toe from attempt to trim his own nails healing well. Musculoskeletal: MMT 5/5 bilateral lower extremities in DF, PF, Inversion and Eversion. Deceased ROM in DF of ankle joint.  Neurological: Sensation intact to light touch.   Assessment:   1. Pain due to onychomycosis of toenails of both feet   2. Corn of foot   3. Chronic anticoagulation      Plan:  Patient was evaluated and treated and all questions answered. -Discussed and educated patient on  foot care, especially with  regards to the vascular, neurological and musculoskeletal systems.  -Discussed supportive shoes at all times and checking feet regularly.  -Mechanically debrided all nails 1-5 bilateral  using sterile nail nipper and filed with dremel without incident  -Hyperkeratotic lesion on right lateral foot debrided with chisel without incident.  -Answered all patient questions -Patient to return  in 3 months for at risk foot care -Patient advised to call the office if any problems or questions arise in the meantime.   Asberry Failing, DPM

## 2023-10-18 ENCOUNTER — Other Ambulatory Visit (INDEPENDENT_AMBULATORY_CARE_PROVIDER_SITE_OTHER)

## 2023-10-18 ENCOUNTER — Ambulatory Visit: Admitting: Sports Medicine

## 2023-10-18 DIAGNOSIS — M170 Bilateral primary osteoarthritis of knee: Secondary | ICD-10-CM | POA: Diagnosis not present

## 2023-10-18 MED ORDER — HYALURONAN 30 MG/2ML IX SOSY
30.0000 mg | PREFILLED_SYRINGE | Freq: Once | INTRA_ARTICULAR | Status: AC
  Administered 2023-10-18: 30 mg via INTRA_ARTICULAR

## 2023-10-18 NOTE — Addendum Note (Signed)
 Addended by: OLEY CHIQUITA CROME on: 10/18/2023 11:20 AM   Modules accepted: Orders

## 2023-10-18 NOTE — Assessment & Plan Note (Signed)
 This is a very pleasant 89 year old male, known bilateral knee osteoarthritis severe on x-rays, we did a knee injection with steroid back in May, he is doing home health physical therapy at his assisted living facility, he is on Eliquis , we increased his pain medication to arthritis Tylenol  and tramadol . We got him approved for Visco and we did Orthovisc #1 of 4 into both knees today. Return in 1 week for #2 of 4.

## 2023-10-18 NOTE — Progress Notes (Signed)
    Procedures performed today:    Procedure: Real-time Ultrasound Guided aspiration/injection of left knee Device: Samsung HS60  Verbal informed consent obtained.  Time-out conducted.  Noted no overlying erythema, induration, or other signs of local infection.  Skin prepped in a sterile fashion.  Local anesthesia: Topical Ethyl chloride.  With sterile technique and under real time ultrasound guidance: Effusion noted, aspirated 29 mL of clear, straw-colored fluid, syringe switched and 1 syringe of 30 mg/2 mL of OrthoVisc (sodium hyaluronate) in a prefilled syringe was injected easily into the knee through an 18-gauge needle. Completed without difficulty  Advised to call if fevers/chills, erythema, induration, drainage, or persistent bleeding.  Images permanently stored and available for review in PACS.  Impression: Technically successful ultrasound guided aspiration/injection.  Procedure: Real-time Ultrasound Guided injection of the right knee Device: Samsung HS60  Verbal informed consent obtained.  Time-out conducted.  Noted no overlying erythema, induration, or other signs of local infection.  Skin prepped in a sterile fashion.  Local anesthesia: Topical Ethyl chloride.  With sterile technique and under real time ultrasound guidance: Mild effusion noted, 30 mg/2 mL of OrthoVisc (sodium hyaluronate) in a prefilled syringe was injected easily into the knee through a 22-gauge needle. Completed without difficulty  Advised to call if fevers/chills, erythema, induration, drainage, or persistent bleeding.  Images permanently stored and available for review in PACS.  Impression: Technically successful ultrasound guided injection.  Independent interpretation of notes and tests performed by another provider:   None.  Brief History, Exam, Impression, and Recommendations:    Primary osteoarthritis of both knees This is a very pleasant 89 year old male, known bilateral knee osteoarthritis  severe on x-rays, we did a knee injection with steroid back in May, he is doing home health physical therapy at his assisted living facility, he is on Eliquis , we increased his pain medication to arthritis Tylenol  and tramadol . We got him approved for Visco and we did Orthovisc #1 of 4 into both knees today. Return in 1 week for #2 of 4.    ____________________________________________ Debby PARAS. Curtis, M.D., ABFM., CAQSM., AME. Primary Care and Sports Medicine [facility]  Adjunct Professor of [facility] Medicine  [employer]  Restaurant manager, fast food

## 2023-10-20 DIAGNOSIS — J449 Chronic obstructive pulmonary disease, unspecified: Secondary | ICD-10-CM | POA: Diagnosis not present

## 2023-10-20 DIAGNOSIS — Z7951 Long term (current) use of inhaled steroids: Secondary | ICD-10-CM | POA: Diagnosis not present

## 2023-10-20 DIAGNOSIS — M1990 Unspecified osteoarthritis, unspecified site: Secondary | ICD-10-CM | POA: Diagnosis not present

## 2023-10-20 DIAGNOSIS — I130 Hypertensive heart and chronic kidney disease with heart failure and stage 1 through stage 4 chronic kidney disease, or unspecified chronic kidney disease: Secondary | ICD-10-CM | POA: Diagnosis not present

## 2023-10-20 DIAGNOSIS — N1831 Chronic kidney disease, stage 3a: Secondary | ICD-10-CM | POA: Diagnosis not present

## 2023-10-20 DIAGNOSIS — E079 Disorder of thyroid, unspecified: Secondary | ICD-10-CM | POA: Diagnosis not present

## 2023-10-20 DIAGNOSIS — I480 Paroxysmal atrial fibrillation: Secondary | ICD-10-CM | POA: Diagnosis not present

## 2023-10-20 DIAGNOSIS — J439 Emphysema, unspecified: Secondary | ICD-10-CM | POA: Diagnosis not present

## 2023-10-20 DIAGNOSIS — I5030 Unspecified diastolic (congestive) heart failure: Secondary | ICD-10-CM | POA: Diagnosis not present

## 2023-10-25 ENCOUNTER — Other Ambulatory Visit (INDEPENDENT_AMBULATORY_CARE_PROVIDER_SITE_OTHER)

## 2023-10-25 ENCOUNTER — Ambulatory Visit: Admitting: Sports Medicine

## 2023-10-25 DIAGNOSIS — M170 Bilateral primary osteoarthritis of knee: Secondary | ICD-10-CM

## 2023-10-25 MED ORDER — HYALURONAN 30 MG/2ML IX SOSY
30.0000 mg | PREFILLED_SYRINGE | Freq: Once | INTRA_ARTICULAR | Status: AC
  Administered 2023-10-25: 30 mg via INTRA_ARTICULAR

## 2023-10-25 NOTE — Addendum Note (Signed)
 Addended by: OLEY CHIQUITA CROME on: 10/25/2023 02:02 PM   Modules accepted: Orders

## 2023-10-25 NOTE — Assessment & Plan Note (Signed)
 This pleasant 89 year old male returns, bilateral knee osteoarthritis on x-rays, he has failed steroid injections back in May, today we will do Orthovisc 2 of 4 both knees, return in 1 week for #3 of 4 both knees.

## 2023-10-25 NOTE — Progress Notes (Signed)
    Procedures performed today:    Procedure: Real-time Ultrasound Guided injection of the left knee Device: Samsung HS60  Verbal informed consent obtained.  Time-out conducted.  Noted no overlying erythema, induration, or other signs of local infection.  Skin prepped in a sterile fashion.  Local anesthesia: Topical Ethyl chloride.  With sterile technique and under real time ultrasound guidance: No effusion noted, 30 mg/2 mL of OrthoVisc (sodium hyaluronate) in a prefilled syringe was injected easily into the knee through a 22-gauge needle. Completed without difficulty  Advised to call if fevers/chills, erythema, induration, drainage, or persistent bleeding.  Images permanently stored and available for review in PACS.  Impression: Technically successful ultrasound guided injection.   Procedure: Real-time Ultrasound Guided injection of the right knee Device: Samsung HS60  Verbal informed consent obtained.  Time-out conducted.  Noted no overlying erythema, induration, or other signs of local infection.  Skin prepped in a sterile fashion.  Local anesthesia: Topical Ethyl chloride.  With sterile technique and under real time ultrasound guidance: No effusion noted, 30 mg/2 mL of OrthoVisc (sodium hyaluronate) in a prefilled syringe was injected easily into the knee through a 22-gauge needle. Completed without difficulty  Advised to call if fevers/chills, erythema, induration, drainage, or persistent bleeding.  Images permanently stored and available for review in PACS.  Impression: Technically successful ultrasound guided injection.  Independent interpretation of notes and tests performed by another provider:   None.  Brief History, Exam, Impression, and Recommendations:    Primary osteoarthritis of both knees This pleasant 89 year old male returns, bilateral knee osteoarthritis on x-rays, he has failed steroid injections back in May, today we will do Orthovisc 2 of 4 both knees,  return in 1 week for #3 of 4 both knees.    ____________________________________________ Debby PARAS. Curtis, M.D., ABFM., CAQSM., AME. Primary Care and Sports Medicine [facility]  Adjunct Professor of [facility] Medicine  [employer]  Restaurant manager, fast food

## 2023-10-27 ENCOUNTER — Other Ambulatory Visit: Payer: Self-pay

## 2023-10-27 ENCOUNTER — Telehealth: Payer: Self-pay | Admitting: Cardiovascular Disease

## 2023-10-27 MED ORDER — APIXABAN 2.5 MG PO TABS: 2.5000 mg | ORAL_TABLET | Freq: Two times a day (BID) | ORAL | 1 refills | Status: AC

## 2023-10-27 NOTE — Telephone Encounter (Signed)
*  STAT* If patient is at the pharmacy, call can be transferred to refill team.   1. Which medications need to be refilled? (please list name of each medication and dose if known) apixaban  (ELIQUIS ) 2.5 MG TABS tablet    2. Would you like to learn more about the convenience, safety, & potential cost savings by using the [facility]?     3. Are you open to using the [facility] (Type [facility].  ).   4. Which pharmacy/location (including street and city if local pharmacy) is medication to be sent to?  [facility] - [place], [place] - [address]     5. Do they need a 30 day or 90 day supply? 90 day

## 2023-10-27 NOTE — Telephone Encounter (Signed)
 Prescription refill request for Eliquis  received. Indication:afib Last office visit:3/25 Scr:1.71  7/25 Age: 89 Weight:82.6  kg  Prescription refilled

## 2023-10-28 ENCOUNTER — Other Ambulatory Visit: Payer: Self-pay | Admitting: Family Medicine

## 2023-11-01 ENCOUNTER — Other Ambulatory Visit (INDEPENDENT_AMBULATORY_CARE_PROVIDER_SITE_OTHER)

## 2023-11-01 ENCOUNTER — Ambulatory Visit (INDEPENDENT_AMBULATORY_CARE_PROVIDER_SITE_OTHER): Admitting: Sports Medicine

## 2023-11-01 ENCOUNTER — Encounter: Payer: Self-pay | Admitting: Sports Medicine

## 2023-11-01 DIAGNOSIS — M170 Bilateral primary osteoarthritis of knee: Secondary | ICD-10-CM

## 2023-11-01 MED ORDER — HYALURONAN 30 MG/2ML IX SOSY
30.0000 mg | PREFILLED_SYRINGE | Freq: Once | INTRA_ARTICULAR | Status: AC
  Administered 2023-11-01 (×2): 30 mg via INTRA_ARTICULAR

## 2023-11-01 MED ORDER — TRAMADOL HCL 50 MG PO TABS: 25.0000 mg | ORAL_TABLET | Freq: Three times a day (TID) | ORAL | 0 refills | Status: DC | PRN

## 2023-11-01 NOTE — Progress Notes (Signed)
    Procedures performed today:    Procedure: Real-time Ultrasound Guided injection of the left knee Device: Samsung HS60  Verbal informed consent obtained.  Time-out conducted.  Noted no overlying erythema, induration, or other signs of local infection.  Skin prepped in a sterile fashion.  Local anesthesia: Topical Ethyl chloride.  With sterile technique and under real time ultrasound guidance: Moderate effusion noted, 30 mg/2 mL of OrthoVisc (sodium hyaluronate) in a prefilled syringe was injected easily into the knee through a 22-gauge needle. Completed without difficulty  Advised to call if fevers/chills, erythema, induration, drainage, or persistent bleeding.  Images permanently stored and available for review in PACS.  Impression: Technically successful ultrasound guided injection.   Procedure: Real-time Ultrasound Guided injection of the right knee Device: Samsung HS60  Verbal informed consent obtained.  Time-out conducted.  Noted no overlying erythema, induration, or other signs of local infection.  Skin prepped in a sterile fashion.  Local anesthesia: Topical Ethyl chloride.  With sterile technique and under real time ultrasound guidance: Mild effusion noted, 30 mg/2 mL of OrthoVisc (sodium hyaluronate) in a prefilled syringe was injected easily into the knee through a 22-gauge needle. Completed without difficulty  Advised to call if fevers/chills, erythema, induration, drainage, or persistent bleeding.  Images permanently stored and available for review in PACS.  Impression: Technically successful ultrasound guided injection.  Independent interpretation of notes and tests performed by another provider:   None.  Brief History, Exam, Impression, and Recommendations:    Primary osteoarthritis of both knees This pleasant 89 year old male returns, today we did Orthovisc No. 3 of 4 both knees, return in 1 week for #4 of  4.    ____________________________________________ Debby PARAS. Curtis, M.D., ABFM., CAQSM., AME. Primary Care and Sports Medicine [facility]  Adjunct Professor of [facility] Medicine  [employer]  Restaurant manager, fast food

## 2023-11-01 NOTE — Addendum Note (Signed)
 Addended by: OLIVA-AVELLANEDA, Shreya Lacasse L on: 11/01/2023 12:04 PM   Modules accepted: Orders

## 2023-11-01 NOTE — Assessment & Plan Note (Signed)
 This pleasant 89 year old male returns, today we did Orthovisc No. 3 of 4 both knees, return in 1 week for #4 of 4.

## 2023-11-02 ENCOUNTER — Encounter: Payer: Self-pay | Admitting: Family Medicine

## 2023-11-02 ENCOUNTER — Other Ambulatory Visit: Payer: Self-pay | Admitting: Family Medicine

## 2023-11-02 ENCOUNTER — Telehealth: Payer: Self-pay

## 2023-11-02 MED ORDER — FLUTICASONE-SALMETEROL 250-50 MCG/ACT IN AEPB: 1.0000 | INHALATION_SPRAY | Freq: Two times a day (BID) | RESPIRATORY_TRACT | 3 refills | Status: DC

## 2023-11-02 MED ORDER — AMOXICILLIN-POT CLAVULANATE 875-125 MG PO TABS: 1.0000 | ORAL_TABLET | Freq: Two times a day (BID) | ORAL | 0 refills | Status: DC

## 2023-11-02 NOTE — Telephone Encounter (Signed)
 Copied from CRM [id number]. Topic: Clinical - Medication Question >> Nov 02, 2023 10:28 AM Adrianna P wrote: Reason for CRM: Patient would like to change to advair, would like a 1 month supply, just to see if it will work again. Please call [phone] if there is any questions

## 2023-11-03 DIAGNOSIS — M1990 Unspecified osteoarthritis, unspecified site: Secondary | ICD-10-CM | POA: Diagnosis not present

## 2023-11-03 DIAGNOSIS — Z7951 Long term (current) use of inhaled steroids: Secondary | ICD-10-CM | POA: Diagnosis not present

## 2023-11-03 DIAGNOSIS — I480 Paroxysmal atrial fibrillation: Secondary | ICD-10-CM | POA: Diagnosis not present

## 2023-11-03 DIAGNOSIS — J439 Emphysema, unspecified: Secondary | ICD-10-CM | POA: Diagnosis not present

## 2023-11-03 DIAGNOSIS — N1831 Chronic kidney disease, stage 3a: Secondary | ICD-10-CM | POA: Diagnosis not present

## 2023-11-03 DIAGNOSIS — J449 Chronic obstructive pulmonary disease, unspecified: Secondary | ICD-10-CM | POA: Diagnosis not present

## 2023-11-03 DIAGNOSIS — I5030 Unspecified diastolic (congestive) heart failure: Secondary | ICD-10-CM | POA: Diagnosis not present

## 2023-11-03 DIAGNOSIS — I130 Hypertensive heart and chronic kidney disease with heart failure and stage 1 through stage 4 chronic kidney disease, or unspecified chronic kidney disease: Secondary | ICD-10-CM | POA: Diagnosis not present

## 2023-11-03 DIAGNOSIS — E079 Disorder of thyroid, unspecified: Secondary | ICD-10-CM | POA: Diagnosis not present

## 2023-11-08 ENCOUNTER — Other Ambulatory Visit (INDEPENDENT_AMBULATORY_CARE_PROVIDER_SITE_OTHER)

## 2023-11-08 ENCOUNTER — Ambulatory Visit: Admitting: Sports Medicine

## 2023-11-08 DIAGNOSIS — M170 Bilateral primary osteoarthritis of knee: Secondary | ICD-10-CM | POA: Diagnosis not present

## 2023-11-08 MED ORDER — HYALURONAN 30 MG/2ML IX SOSY
30.0000 mg | PREFILLED_SYRINGE | Freq: Once | INTRA_ARTICULAR | Status: AC
  Administered 2023-11-08: 30 mg via INTRA_ARTICULAR

## 2023-11-08 NOTE — Progress Notes (Signed)
    Procedures performed today:    Procedure: Real-time Ultrasound Guided injection of the left knee Device: Samsung HS60  Verbal informed consent obtained.  Time-out conducted.  Noted no overlying erythema, induration, or other signs of local infection.  Skin prepped in a sterile fashion.  Local anesthesia: Topical Ethyl chloride.  With sterile technique and under real time ultrasound guidance: Moderate effusion noted, aspirated 22mL, syringe switched 30 mg/2 mL of OrthoVisc (sodium hyaluronate) in a prefilled syringe was injected easily into the knee through a 18-gauge needle. Completed without difficulty  Advised to call if fevers/chills, erythema, induration, drainage, or persistent bleeding.  Images permanently stored and available for review in PACS.  Impression: Technically successful ultrasound guided injection.   Procedure: Real-time Ultrasound Guided injection of the right knee Device: Samsung HS60  Verbal informed consent obtained.  Time-out conducted.  Noted no overlying erythema, induration, or other signs of local infection.  Skin prepped in a sterile fashion.  Local anesthesia: Topical Ethyl chloride.  With sterile technique and under real time ultrasound guidance: Mild effusion noted, 30 mg/2 mL of OrthoVisc (sodium hyaluronate) in a prefilled syringe was injected easily into the knee through a 22-gauge needle. Completed without difficulty  Advised to call if fevers/chills, erythema, induration, drainage, or persistent bleeding.  Images permanently stored and available for review in PACS.  Impression: Technically successful ultrasound guided injection.  Independent interpretation of notes and tests performed by another provider:   None.  Brief History, Exam, Impression, and Recommendations:    Primary osteoarthritis of both knees Exquisitely pleasant 89 year old male, Orthovisc 4/4, return in 6 weeks as  needed.    ____________________________________________ Debby PARAS. Curtis, M.D., ABFM., CAQSM., AME. Primary Care and Sports Medicine [facility]  Adjunct Professor of [facility] Medicine  [employer]  Restaurant manager, fast food

## 2023-11-08 NOTE — Assessment & Plan Note (Signed)
 Exquisitely pleasant 89 year old male, Orthovisc 4/4, return in 6 weeks as needed.

## 2023-11-08 NOTE — Addendum Note (Signed)
 Addended by: OLIVA-AVELLANEDA, Golden Emile L on: 11/08/2023 11:30 AM   Modules accepted: Orders

## 2023-11-16 DIAGNOSIS — J449 Chronic obstructive pulmonary disease, unspecified: Secondary | ICD-10-CM | POA: Diagnosis not present

## 2023-11-16 DIAGNOSIS — N1831 Chronic kidney disease, stage 3a: Secondary | ICD-10-CM | POA: Diagnosis not present

## 2023-11-16 DIAGNOSIS — I480 Paroxysmal atrial fibrillation: Secondary | ICD-10-CM | POA: Diagnosis not present

## 2023-11-16 DIAGNOSIS — M1990 Unspecified osteoarthritis, unspecified site: Secondary | ICD-10-CM | POA: Diagnosis not present

## 2023-11-16 DIAGNOSIS — I5030 Unspecified diastolic (congestive) heart failure: Secondary | ICD-10-CM | POA: Diagnosis not present

## 2023-11-16 DIAGNOSIS — E079 Disorder of thyroid, unspecified: Secondary | ICD-10-CM | POA: Diagnosis not present

## 2023-11-16 DIAGNOSIS — Z7951 Long term (current) use of inhaled steroids: Secondary | ICD-10-CM | POA: Diagnosis not present

## 2023-11-16 DIAGNOSIS — J439 Emphysema, unspecified: Secondary | ICD-10-CM | POA: Diagnosis not present

## 2023-11-16 DIAGNOSIS — I130 Hypertensive heart and chronic kidney disease with heart failure and stage 1 through stage 4 chronic kidney disease, or unspecified chronic kidney disease: Secondary | ICD-10-CM | POA: Diagnosis not present

## 2023-11-23 ENCOUNTER — Encounter: Payer: Self-pay | Admitting: Sports Medicine

## 2023-11-27 ENCOUNTER — Other Ambulatory Visit: Payer: Self-pay | Admitting: Cardiovascular Disease

## 2023-11-30 ENCOUNTER — Ambulatory Visit: Payer: Medicare HMO

## 2023-11-30 VITALS — Ht 71.5 in | Wt 181.0 lb

## 2023-11-30 DIAGNOSIS — Z0000 Encounter for general adult medical examination without abnormal findings: Secondary | ICD-10-CM | POA: Diagnosis not present

## 2023-11-30 NOTE — Progress Notes (Signed)
 Subjective:   Samuel Mcconnell is a 89 y.o. male who presents for Medicare Annual/Subsequent preventive examination.  Visit Complete: Virtual I connected with  Samuel Mcconnell on 11/30/23 by a audio enabled telemedicine application and verified that I am speaking with the correct person using two identifiers.  Patient Location: Home  Provider Location: Office/Clinic  I discussed the limitations of evaluation and management by telemedicine. The patient expressed understanding and agreed to proceed.  Vital Signs: Because this visit was a virtual/telehealth visit, some criteria may be missing or patient reported. Any vitals not documented were not able to be obtained and vitals that have been documented are patient reported.  Patient Medicare AWV questionnaire was completed by the patient on 11/29/2023; I have confirmed that all information answered by patient is correct and no changes since this date.  Cardiac Risk Factors include: advanced age (>60men, >30 women);male gender;hypertension;smoking/ tobacco exposure     Objective:    Today's Vitals   11/30/23 1054  Weight: 181 lb (82.1 kg)  Height: 5' 11.5 (1.816 m)   Body mass index is 24.89 kg/m.     11/30/2023   11:09 AM 05/09/2023    3:52 PM 11/25/2022   11:10 AM 11/19/2021   11:11 AM 10/25/2021    6:10 PM 07/03/2021    8:53 AM 09/16/2020   10:12 AM  Advanced Directives  Does Patient Have a Medical Advance Directive? Yes Yes Yes Yes Yes Yes Yes  Type of Estate agent of [place];Living will Healthcare Power of [place];Living will Living will Living will Healthcare Power of [employer] Power of [place];Living will Living will;Healthcare Power of Attorney  Does patient want to make changes to medical advance directive? No - Patient declined No - Patient declined No - Patient declined No - Patient declined No - Patient declined No - Patient declined No - Patient declined  Copy of Healthcare Power of Attorney  in Chart?  No - copy requested  No - copy requested No - copy requested No - copy requested No - copy requested    Current Medications (verified) Outpatient Encounter Medications as of 11/30/2023  Medication Sig   acetaminophen  (TYLENOL ) 500 MG tablet Take 500 mg by mouth daily as needed for mild pain (pain score 1-3), moderate pain (pain score 4-6) or headache.   albuterol  (VENTOLIN  HFA) 108 (90 Base) MCG/ACT inhaler Inhale 2 puffs into the lungs every 6 (six) hours as needed for wheezing or shortness of breath.   AMBULATORY NON FORMULARY MEDICATION Please provide toilet riser/elevator with arms.  Diagnosis: Generalized weakness R53.1   amiodarone  (PACERONE ) 200 MG tablet Take 1 tablet (200 mg total) by mouth daily.   apixaban  (ELIQUIS ) 2.5 MG TABS tablet Take 1 tablet (2.5 mg total) by mouth 2 (two) times daily.   BREZTRI  AEROSPHERE 160-9-4.8 MCG/ACT AERO inhaler INHALE 2 PUFFS INTO THE LUNGS 2 TIMES DAILY   Cholecalciferol  (VITAMIN D3) 125 MCG (5000 UT) TABS Take 5,000 Units by mouth daily with lunch.   diclofenac Sodium (VOLTAREN) 1 % GEL Apply 2 g topically 4 (four) times daily as needed (pain).   diphenhydramine -acetaminophen  (TYLENOL  PM) 25-500 MG TABS tablet Take 1 tablet by mouth at bedtime as needed (for sleeplessness).   fluticasone  (FLONASE ) 50 MCG/ACT nasal spray Place 2 sprays into both nostrils daily as needed for allergies or rhinitis.   fluticasone -salmeterol (ADVAIR DISKUS) 250-50 MCG/ACT AEPB Inhale 1 puff into the lungs in the morning and at bedtime.   furosemide  (LASIX ) 40 MG tablet Take  1 tablet (40 mg total) by mouth daily.   levothyroxine  (SYNTHROID ) 137 MCG tablet Take 1 tablet (137 mcg total) by mouth daily before breakfast.   Multiple Vitamin (MULTIVITAMIN) capsule Take 1 capsule by mouth daily with breakfast.   NON FORMULARY Take 2 tablets by mouth See admin instructions. SuperBeets Heart Chews- Chew 2 tablets by mouth once a day   potassium chloride  (KLOR-CON ) 10 MEQ  tablet Take 1 tablet (10 mEq total) by mouth daily.   rOPINIRole  (REQUIP ) 0.25 MG tablet Take 3 tablets (0.75 mg total) by mouth at bedtime.   traMADol  (ULTRAM ) 50 MG tablet Take 0.5-1 tablets (25-50 mg total) by mouth every 8 (eight) hours as needed for moderate pain (pain score 4-6).   triamcinolone  ointment (KENALOG ) 0.1 % Apply 1 Application topically 2 (two) times daily as needed (irritation).   zinc gluconate 50 MG tablet Take 50 mg by mouth daily with lunch.   [DISCONTINUED] amoxicillin -clavulanate (AUGMENTIN ) 875-125 MG tablet Take 1 tablet by mouth 2 (two) times daily.   No facility-administered encounter medications on file as of 11/30/2023.    Allergies (verified) Clindamycin/lincomycin, Dabigatran etexilate mesylate, and Doxycycline    History: Past Medical History:  Diagnosis Date   Arthritis    Atrial fibrillation (HCC)    radiofrequency ablation   Cataract    CHF (congestive heart failure) (HCC)    Clotting disorder (HCC)    due to medication   Emphysema    Emphysema of lung (HCC)    Hypertension    Thyroid  disease    Ulcer    GI years prior   Past Surgical History:  Procedure Laterality Date   CARDIAC SURGERY     CARDIOVERSION N/A 10/24/2019   Procedure: CARDIOVERSION;  Surgeon: Francyne Headland, MD;  Location: [facility] ENDOSCOPY;  Service: Cardiovascular;  Laterality: N/A;   CARDIOVERSION N/A 10/22/2020   Procedure: CARDIOVERSION;  Surgeon: Alveta Aleene PARAS, MD;  Location: [facility];  Service: Cardiovascular;  Laterality: N/A;   COLON SURGERY  03/23/1997   EYE SURGERY     KNEE ARTHROSCOPY Left 07/21/2000   LOOP RECORDER IMPLANT  01/16/2010   Medtronic Reveal XT (Dr. HILARIO Jester)    RADIOFREQUENCY ABLATION  06/13/2009   SHOULDER ARTHROSCOPY WITH ROTATOR CUFF REPAIR Left 04/23/1996   TIBIAL TUBERCLERPLASTY  ~ 10/2010   TRANSTHORACIC ECHOCARDIOGRAM  11/19/2011   EF=>55%; mild MR, mild mitral annular calcif; mild TR, normal RSVP; AV mildly sclerotic, mild AV regurg;  trace pulm valve regurg    Family History  Problem Relation Age of Onset   Heart failure Mother    Heart failure Father        MI - died @ 76   Atrial fibrillation Sister    Hypertension Sister    Heart Problems Sister    Diabetes Sister    Heart failure Sister    Atrial fibrillation Brother        dx'ed age 8   Atrial fibrillation Brother        dx'ed age 92   Heart failure Brother    Diabetes Son    Pancreatitis Son        also ETOH abuse    Cancer Maternal Grandmother    Heart disease Maternal Grandfather    Social History   Socioeconomic History   Marital status: Married    Spouse name: Dickey   Number of children: 2   Years of education: 16   Highest education level: Bachelor's degree (e.g., BA, AB, BS)  Occupational History  Occupation: Retired  Tobacco Use   Smoking status: Former    Current packs/day: 0.00    Types: Cigarettes    Start date: 03/14/1956    Quit date: 03/14/2002    Years since quitting: 21.7   Smokeless tobacco: Never   Tobacco comments:    Former smoker 01/23/2021  Vaping Use   Vaping status: Never Used  Substance and Sexual Activity   Alcohol use: Not Currently    Alcohol/week: 1.0 standard drink of alcohol    Types: 1 Glasses of wine per week   Drug use: No   Sexual activity: Not Currently    Partners: Female    Birth control/protection: None  Other Topics Concern   Not on file  Social History Narrative   Lives at [facility] and lives alone. He enjoys walking and going to the gym.   Social Drivers of Corporate investment banker Strain: Low Risk  (11/30/2023)   Overall Financial Resource Strain (CARDIA)    Difficulty of Paying Living Expenses: Not hard at all  Food Insecurity: No Food Insecurity (11/30/2023)   Hunger Vital Sign    Worried About Running Out of Food in the Last Year: Never true    Ran Out of Food in the Last Year: Never true  Transportation Needs: No Transportation Needs (11/30/2023)   PRAPARE -  Administrator, Civil Service (Medical): No    Lack of Transportation (Non-Medical): No  Physical Activity: Insufficiently Active (11/30/2023)   Exercise Vital Sign    Days of Exercise per Week: 1 day    Minutes of Exercise per Session: 10 min  Stress: No Stress Concern Present (11/30/2023)   [employer] of Occupational Health - Occupational Stress Questionnaire    Feeling of Stress: Not at all  Social Connections: Moderately Integrated (11/30/2023)   Social Connection and Isolation Panel    Frequency of Communication with Friends and Family: More than three times a week    Frequency of Social Gatherings with Friends and Family: More than three times a week    Attends Religious Services: More than 4 times per year    Active Member of [employer] or Organizations: Yes    Attends Banker Meetings: More than 4 times per year    Marital Status: Widowed  Recent Concern: Social Connections - Moderately Isolated (09/20/2023)   Social Connection and Isolation Panel    Frequency of Communication with Friends and Family: More than three times a week    Frequency of Social Gatherings with Friends and Family: More than three times a week    Attends Religious Services: More than 4 times per year    Active Member of [employer] or Organizations: No    Attends Banker Meetings: Not on file    Marital Status: Widowed    Tobacco Counseling Counseling given: Not Answered Tobacco comments: Former smoker 01/23/2021   Clinical Intake:  Pre-visit preparation completed: Yes  Pain : No/denies pain     BMI - recorded: 24.89 Nutritional Status: BMI of 19-24  Normal Nutritional Risks: None Diabetes: No  How often do you need to have someone help you when you read instructions, pamphlets, or other written materials from your doctor or pharmacy?: 1 - Never What is the last grade level you completed in school?: 16  Interpreter Needed?: No      Activities of Daily  Living    11/30/2023   10:56 AM 11/29/2023    5:48 PM  In  your present state of health, do you have any difficulty performing the following activities:  Hearing? 1 1   Vision? 0 0   Difficulty concentrating or making decisions? 1 1   Walking or climbing stairs? 1 1   Dressing or bathing? 0 0   Doing errands, shopping? 1 1   Preparing Food and eating ? N N   Using the Toilet? N N   In the past six months, have you accidently leaked urine? Y Y   Do you have problems with loss of bowel control? N N   Managing your Medications? N N   Managing your Finances? N N   Housekeeping or managing your Housekeeping? LOISE LOISE      Proxy-reported    Patient Care Team: Alvia Bring, DO as PCP - General (Family Medicine) Croitoru, Jerel, MD as PCP - Cardiology (Cardiology)  Indicate any recent Medical Services you may have received from other than [facility] providers in the past year (date may be approximate).     Assessment:   This is a routine wellness examination for Samuel Mcconnell.  Hearing/Vision screen No results found.   Goals Addressed             This Visit's Progress    Patient Stated       Patient states he would like to continue exercise.        Depression Screen    11/30/2023   11:08 AM 11/25/2022   11:07 AM 11/19/2021   11:12 AM 07/24/2021   11:12 AM 05/21/2021    1:35 PM 11/18/2020    1:16 PM 09/16/2020   10:08 AM  PHQ 2/9 Scores  PHQ - 2 Score 0 0 0 0 2 0 0    Fall Risk    11/30/2023   11:10 AM 11/29/2023    5:48 PM 03/31/2023    1:27 PM 11/25/2022   11:06 AM 11/24/2022   12:25 PM  Fall Risk   Falls in the past year? 1 1  1 1 1    Number falls in past yr: 0 0  1 0 0   Injury with Fall? 1 1  1 1 1    Risk for fall due to : Impaired balance/gait;Impaired mobility  Impaired balance/gait;Impaired mobility History of fall(s)   Follow up Falls evaluation completed  Falls prevention discussed Falls evaluation completed;Education provided;Falls prevention discussed      Proxy-reported     MEDICARE RISK AT HOME: Medicare Risk at Home Any stairs in or around the home?: No Home free of loose throw rugs in walkways, pet beds, electrical cords, etc?: Yes Adequate lighting in your home to reduce risk of falls?: Yes Life alert?: No Use of a cane, walker or w/c?: Yes Grab bars in the bathroom?: Yes Shower chair or bench in shower?: No Elevated toilet seat or a handicapped toilet?: Yes  TIMED UP AND GO:  Was the test performed?  No    Cognitive Function:        11/30/2023   11:10 AM 11/25/2022   11:15 AM 11/19/2021   11:16 AM 09/16/2020   10:20 AM  6CIT Screen  What Year? 0 points 0 points 0 points 0 points  What month? 0 points 0 points 0 points 0 points  What time? 0 points 0 points 0 points 0 points  Count back from 20 0 points 0 points 0 points 0 points  Months in reverse 2 points 0 points 0 points 0 points  Repeat phrase 4 points 2  points 2 points 0 points  Total Score 6 points 2 points 2 points 0 points    Immunizations Immunization History  Administered Date(s) Administered   Fluad Quad(high Dose 65+) 01/22/2020, 11/18/2020, 12/02/2021, 12/21/2022   INFLUENZA, HIGH DOSE SEASONAL PF 01/14/2015, 12/31/2015, 12/24/2016, 01/21/2018, 01/21/2018, 12/14/2018   Influenza Split 12/25/2010   Influenza, Seasonal, Injecte, Preservative Fre 01/02/2014   Influenza-Unspecified 12/25/2010, 12/26/2012, 01/02/2014   PFIZER(Purple Top)SARS-COV-2 Vaccination 05/06/2019, 05/29/2019, 11/29/2019, 07/11/2020   Pfizer Covid-19 Vaccine Bivalent Booster 15yrs & up 03/21/2021   Pfizer(Comirnaty)Fall Seasonal Vaccine 12 years and older 12/11/2022   Pneumococcal Conjugate-13 06/16/2013   Pneumococcal Polysaccharide-23 03/23/2006   Tdap 08/14/2014, 09/07/2019   Zoster Recombinant(Shingrix) 11/11/2017, 03/18/2018   Zoster, Live 08/14/2014    TDAP status: Up to date  Flu Vaccine status: Due, Education has been provided regarding the importance of this vaccine. Advised may receive  this vaccine at local pharmacy or Health Dept. Aware to provide a copy of the vaccination record if obtained from local pharmacy or Health Dept. Verbalized acceptance and understanding.  Pneumococcal vaccine status: Up to date  Covid-19 vaccine status: Information provided on how to obtain vaccines.   Qualifies for Shingles Vaccine? Yes   Zostavax completed Yes   Shingrix Completed?: Yes  Screening Tests Health Maintenance  Topic Date Due   Influenza Vaccine  10/22/2023   COVID-19 Vaccine (7 - Pfizer risk 2024-25 season) 11/22/2023   Medicare Annual Wellness (AWV)  11/29/2024   DTaP/Tdap/Td (3 - Td or Tdap) 09/06/2029   Pneumococcal Vaccine: 50+ Years  Completed   Zoster Vaccines- Shingrix  Completed   HPV VACCINES  Aged Out   Meningococcal B Vaccine  Aged Out    Health Maintenance  Health Maintenance Due  Topic Date Due   Influenza Vaccine  10/22/2023   COVID-19 Vaccine (7 - Pfizer risk 2024-25 season) 11/22/2023    Colorectal cancer screening: No longer required.   Lung Cancer Screening: (Low Dose CT Chest recommended if Age 88-80 years, 20 pack-year currently smoking OR have quit w/in 15years.) does not qualify.   Lung Cancer Screening Referral: n/a  Additional Screening:  Hepatitis C Screening: does not qualify; Completed   Vision Screening: Recommended annual ophthalmology exams for early detection of glaucoma and other disorders of the eye. Is the patient up to date with their annual eye exam?  Yes  Who is the provider or what is the name of the office in which the patient attends annual eye exams?  If pt is not established with a provider, would they like to be referred to a provider to establish care? N/a.   Dental Screening: Recommended annual dental exams for proper oral hygiene   Community Resource Referral / Chronic Care Management: CRR required this visit?  No   CCM required this visit?  No     Plan:     I have personally reviewed and noted the  following in the patient's chart:   Medical and social history Use of alcohol, tobacco or illicit drugs  Current medications and supplements including opioid prescriptions. Patient is not currently taking opioid prescriptions. Functional ability and status Nutritional status Physical activity Advanced directives List of other physicians Hospitalizations # 0, surgeries # 0, and ER # 2 visits in previous 12 months Vitals Screenings to include cognitive, depression, and falls Referrals and appointments  In addition, I have reviewed and discussed with patient certain preventive protocols, quality metrics, and best practice recommendations. A written personalized care plan for preventive services as well  as general preventive health recommendations were provided to patient.     Bonny Jon Mayor, CMA   11/30/2023   After Visit Summary: (MyChart) Due to this being a telephonic visit, the after visit summary with patients personalized plan was offered to patient via MyChart   Nurse Notes:   Samuel Mcconnell is a 89 y.o. male patient of Alvia Bring, DO who had a Medicare Annual Wellness Visit today via telephone. Hazaiah is Retired and lives alone at [facility] independent facility. He has 2 children. He reports that he is socially active and does interact with friends/family regularly. He is minimally physically active and enjoys walking and going to the gym.

## 2023-11-30 NOTE — Patient Instructions (Signed)
  [name] , Thank you for taking time to come for your Medicare Wellness Visit. I appreciate your ongoing commitment to your health goals. Please review the following plan we discussed and let me know if I can assist you in the future.   These are the goals we discussed:  Goals       Medication Management      Patient Goals/Self-Care Activities Over the next 30 days, patient will:  take medications as prescribed  Follow Up Plan: The care management team will reach out to the patient again over the next 30 days.       Patient Stated (pt-stated)      09/16/2020 AWV Goal: Exercise for General Health  Patient will verbalize understanding of the benefits of increased physical activity: Exercising regularly is important. It will improve your overall fitness, flexibility, and endurance. Regular exercise also will improve your overall health. It can help you control your weight, reduce stress, and improve your bone density. Over the next year, patient will increase physical activity as tolerated with a goal of at least 150 minutes of moderate physical activity per week.  You can tell that you are exercising at a moderate intensity if your heart starts beating faster and you start breathing faster but can still hold a conversation. Moderate-intensity exercise ideas include: Walking 1 mile (1.6 km) in about 15 minutes Biking Hiking Golfing Dancing Water aerobics Patient will verbalize understanding of everyday activities that increase physical activity by providing examples like the following: Yard work, such as: Insurance underwriter Gardening Washing windows or floors Patient will be able to explain general safety guidelines for exercising:  Before you start a new exercise program, talk with your health care provider. Do not exercise so much that you hurt yourself, feel dizzy, or get very short of  breath. Wear comfortable clothes and wear shoes with good support. Drink plenty of water while you exercise to prevent dehydration or heat stroke. Work out until your breathing and your heartbeat get faster.       Patient Stated (pt-stated)      Patient stated that he would like to be able to walk without a walker or a cane.      Patient Stated (pt-stated)      Patient stated that he would like to maintain his exercise routine.      Patient Stated      Patient states he would like to continue exercise.         This is a list of the screening recommended for you and due dates:  Health Maintenance  Topic Date Due   Flu Shot  10/22/2023   COVID-19 Vaccine (7 - Pfizer risk 2024-25 season) 11/22/2023   Medicare Annual Wellness Visit  11/29/2024   DTaP/Tdap/Td vaccine (3 - Td or Tdap) 09/06/2029   Pneumococcal Vaccine for age over 31  Completed   Zoster (Shingles) Vaccine  Completed   HPV Vaccine  Aged Out   Meningitis B Vaccine  Aged Out

## 2023-12-14 ENCOUNTER — Ambulatory Visit (INDEPENDENT_AMBULATORY_CARE_PROVIDER_SITE_OTHER)

## 2023-12-14 ENCOUNTER — Encounter: Payer: Self-pay | Admitting: Family Medicine

## 2023-12-14 ENCOUNTER — Ambulatory Visit (INDEPENDENT_AMBULATORY_CARE_PROVIDER_SITE_OTHER): Admitting: Family Medicine

## 2023-12-14 ENCOUNTER — Ambulatory Visit: Payer: Self-pay | Admitting: Family Medicine

## 2023-12-14 VITALS — BP 120/59 | HR 69 | Ht 71.5 in | Wt 179.0 lb

## 2023-12-14 DIAGNOSIS — R0781 Pleurodynia: Secondary | ICD-10-CM | POA: Diagnosis not present

## 2023-12-14 DIAGNOSIS — R410 Disorientation, unspecified: Secondary | ICD-10-CM | POA: Diagnosis not present

## 2023-12-14 DIAGNOSIS — R0902 Hypoxemia: Secondary | ICD-10-CM | POA: Diagnosis not present

## 2023-12-14 DIAGNOSIS — R0600 Dyspnea, unspecified: Secondary | ICD-10-CM

## 2023-12-14 DIAGNOSIS — W19XXXA Unspecified fall, initial encounter: Secondary | ICD-10-CM

## 2023-12-14 DIAGNOSIS — J449 Chronic obstructive pulmonary disease, unspecified: Secondary | ICD-10-CM

## 2023-12-14 DIAGNOSIS — I480 Paroxysmal atrial fibrillation: Secondary | ICD-10-CM | POA: Diagnosis not present

## 2023-12-14 MED ORDER — SPACER/AERO-HOLDING CHAMBERS DEVI: 0 refills | Status: AC

## 2023-12-14 MED ORDER — AMBULATORY NON FORMULARY MEDICATION: 0 refills | Status: AC

## 2023-12-14 MED ORDER — IPRATROPIUM-ALBUTEROL 0.5-2.5 (3) MG/3ML IN SOLN: 3.0000 mL | Freq: Four times a day (QID) | RESPIRATORY_TRACT | 3 refills | Status: AC | PRN

## 2023-12-14 MED ORDER — CEFTRIAXONE SODIUM 1 G IJ SOLR
1.0000 g | Freq: Once | INTRAMUSCULAR | Status: AC
  Administered 2023-12-14: 1 g via INTRAMUSCULAR

## 2023-12-14 MED ORDER — IPRATROPIUM-ALBUTEROL 0.5-2.5 (3) MG/3ML IN SOLN: 3.0000 mL | Freq: Four times a day (QID) | RESPIRATORY_TRACT | Status: DC

## 2023-12-14 NOTE — Assessment & Plan Note (Signed)
 Normal rate and rhythm on exam today.

## 2023-12-14 NOTE — Assessment & Plan Note (Signed)
 He did have a fall, now with rib pain.  He is on anticoagulation, no head injury.  X-rays with rib detail ordered.

## 2023-12-14 NOTE — Progress Notes (Signed)
 Samuel Mcconnell - 89 y.o. male MRN [id number]  Date of birth: 07/24/1927  Subjective Chief Complaint  Patient presents with   Fall    HPI Samuel Mcconnell is a 89 y.o. male here today after experiencing fall a couple days ago.  He lost his balance and fell.  Partially caught himself against the wall but hit his ribs on the left side and scraped his right wrist.  He has felt more short of breath recently.  He has history of COPD as well as recurrent pneumonia.  He is uncomfortable inspiring deep enough to use his inhalers.  He denies fever or chills.  He does feel more fatigued.  O2 sats do indicate some hypoxemia at rest with oximetry at 89% on room air.  ROS:  A comprehensive ROS was completed and negative except as noted per HPI  Allergies  Allergen Reactions   Clindamycin/Lincomycin Other (See Comments)    Patient felt weird    Dabigatran Etexilate Mesylate Other (See Comments)    felt weird (name brand Pradaxa)   Doxycycline  Other (See Comments)    Dyspepsia    Past Medical History:  Diagnosis Date   Arthritis    Atrial fibrillation (HCC)    radiofrequency ablation   Cataract    CHF (congestive heart failure) (HCC)    Clotting disorder    due to medication   Emphysema    Emphysema of lung (HCC)    Hypertension    Hypoxemia 12/14/2023   Thyroid  disease    Ulcer    GI years prior    Past Surgical History:  Procedure Laterality Date   CARDIAC SURGERY     CARDIOVERSION N/A 10/24/2019   Procedure: CARDIOVERSION;  Surgeon: Francyne Headland, MD;  Location: [facility] ENDOSCOPY;  Service: Cardiovascular;  Laterality: N/A;   CARDIOVERSION N/A 10/22/2020   Procedure: CARDIOVERSION;  Surgeon: Alveta Aleene PARAS, MD;  Location: [facility] ENDOSCOPY;  Service: Cardiovascular;  Laterality: N/A;   COLON SURGERY  03/23/1997   EYE SURGERY     KNEE ARTHROSCOPY Left 07/21/2000   LOOP RECORDER IMPLANT  01/16/2010   Medtronic Reveal XT (Dr. HILARIO Jester)    RADIOFREQUENCY ABLATION  06/13/2009    SHOULDER ARTHROSCOPY WITH ROTATOR CUFF REPAIR Left 04/23/1996   TIBIAL TUBERCLERPLASTY  ~ 10/2010   TRANSTHORACIC ECHOCARDIOGRAM  11/19/2011   EF=>55%; mild MR, mild mitral annular calcif; mild TR, normal RSVP; AV mildly sclerotic, mild AV regurg; trace pulm valve regurg     Social History   Socioeconomic History   Marital status: Married    Spouse name: Dickey   Number of children: 2   Years of education: 16   Highest education level: Bachelor's degree (e.g., BA, AB, BS)  Occupational History   Occupation: Retired  Tobacco Use   Smoking status: Former    Current packs/day: 0.00    Types: Cigarettes    Start date: 03/14/1956    Quit date: 03/14/2002    Years since quitting: 21.7   Smokeless tobacco: Never   Tobacco comments:    Former smoker 01/23/2021  Vaping Use   Vaping status: Never Used  Substance and Sexual Activity   Alcohol use: Not Currently    Alcohol/week: 1.0 standard drink of alcohol    Types: 1 Glasses of wine per week   Drug use: No   Sexual activity: Not Currently    Partners: Female    Birth control/protection: None  Other Topics Concern   Not on file  Social History Narrative   Lives  at [facility] and lives alone. He enjoys walking and going to the gym.   Social Drivers of Corporate investment banker Strain: Low Risk  (11/30/2023)   Overall Financial Resource Strain (CARDIA)    Difficulty of Paying Living Expenses: Not hard at all  Food Insecurity: No Food Insecurity (11/30/2023)   Hunger Vital Sign    Worried About Running Out of Food in the Last Year: Never true    Ran Out of Food in the Last Year: Never true  Transportation Needs: No Transportation Needs (11/30/2023)   PRAPARE - Administrator, Civil Service (Medical): No    Lack of Transportation (Non-Medical): No  Physical Activity: Insufficiently Active (11/30/2023)   Exercise Vital Sign    Days of Exercise per Week: 1 day    Minutes of Exercise per Session: 10 min  Stress: No  Stress Concern Present (11/30/2023)   [employer] of Occupational Health - Occupational Stress Questionnaire    Feeling of Stress: Not at all  Social Connections: Moderately Integrated (11/30/2023)   Social Connection and Isolation Panel    Frequency of Communication with Friends and Family: More than three times a week    Frequency of Social Gatherings with Friends and Family: More than three times a week    Attends Religious Services: More than 4 times per year    Active Member of [employer] or Organizations: Yes    Attends Banker Meetings: More than 4 times per year    Marital Status: Widowed  Recent Concern: Social Connections - Moderately Isolated (09/20/2023)   Social Connection and Isolation Panel    Frequency of Communication with Friends and Family: More than three times a week    Frequency of Social Gatherings with Friends and Family: More than three times a week    Attends Religious Services: More than 4 times per year    Active Member of [employer] or Organizations: No    Attends Banker Meetings: Not on file    Marital Status: Widowed    Family History  Problem Relation Age of Onset   Heart failure Mother    Heart failure Father        MI - died @ 38   Atrial fibrillation Sister    Hypertension Sister    Heart Problems Sister    Diabetes Sister    Heart failure Sister    Atrial fibrillation Brother        dx'ed age 30   Atrial fibrillation Brother        dx'ed age 67   Heart failure Brother    Diabetes Son    Pancreatitis Son        also ETOH abuse    Cancer Maternal Grandmother    Heart disease Maternal Grandfather     Health Maintenance  Topic Date Due   Influenza Vaccine  10/22/2023   COVID-19 Vaccine (7 - Pfizer risk 2024-25 season) 11/22/2023   Medicare Annual Wellness (AWV)  11/29/2024   DTaP/Tdap/Td (3 - Td or Tdap) 09/06/2029   Pneumococcal Vaccine: 50+ Years  Completed   Zoster Vaccines- Shingrix  Completed   HPV VACCINES   Aged Out   Meningococcal B Vaccine  Aged Out     ----------------------------------------------------------------------------------------------------------------------------------------------------------------------------------------------------------------- Physical Exam BP (!) 120/59 (BP Location: Left Arm, Patient Position: Sitting, Cuff Size: Normal)   Pulse 69   Ht 5' 11.5 (1.816 m)   Wt 179 lb (81.2 kg)   SpO2 94%  BMI 24.62 kg/m   Physical Exam Constitutional:      Appearance: Normal appearance.  Eyes:     General: No scleral icterus. Cardiovascular:     Rate and Rhythm: Normal rate and regular rhythm.  Pulmonary:     Comments: Decreased inspiration noted initially. Reexamined after DuoNeb treatment and much better inspiration.  Some decreased air movement on the right lower lobe. Neurological:     Mental Status: He is alert.  Psychiatric:        Mood and Affect: Mood normal.        Behavior: Behavior normal.     ------------------------------------------------------------------------------------------------------------------------------------------------------------------------------------------------------------------- Assessment and Plan  Chronic obstructive pulmonary disease (HCC) Decreased air movement on exam today.  Improved with DuoNeb treatment.  Provided with nebulizer for home and prescription for DuoNeb sent into use every 6 hours as needed.  He did have some decreased air movement in the right lower lobe and was given injection of Rocephin  today.  Chest x-ray ordered.  Continue Breztri  daily.  He does have hypoxemia at rest we will work on getting him set up for home oxygen concentrator to use as needed at home.  PAF (paroxysmal atrial fibrillation) (HCC) Normal rate and rhythm on exam today.  Fall He did have a fall, now with rib pain.  He is on anticoagulation, no head injury.  X-rays with rib detail ordered.  Confusion Cognition is a little  sluggish today compared to his baseline.  Check urinalysis.  Likely related to his hypoxemia.  Hypoxemia O2 saturations are 89% at rest on room air.  Improved to 94% with 2 L/min of O2.   Meds ordered this encounter  Medications   AMBULATORY NON FORMULARY MEDICATION    Sig: Please provide home oxygen concentrator.  Flow 2 LPM O2 on RA at rest-89% O2 on 2LPM O2 at rest- 94%    Dispense:  1 Device    Refill:  0   ipratropium-albuterol  (DUONEB) 0.5-2.5 (3) MG/3ML SOLN    Sig: Take 3 mLs by nebulization every 6 (six) hours as needed.    Dispense:  120 mL    Refill:  3    Please dispense #120 vials   Spacer/Aero-Holding Raguel DEVI    Sig: Use with inhaler.    Dispense:  1 Units    Refill:  0   cefTRIAXone  (ROCEPHIN ) injection 1 g   ipratropium-albuterol  (DUONEB) 0.5-2.5 (3) MG/3ML nebulizer solution 3 mL    No follow-ups on file.

## 2023-12-14 NOTE — Assessment & Plan Note (Signed)
 O2 saturations are 89% at rest on room air.  Improved to 94% with 2 L/min of O2.

## 2023-12-14 NOTE — Assessment & Plan Note (Signed)
 Decreased air movement on exam today.  Improved with DuoNeb treatment.  Provided with nebulizer for home and prescription for DuoNeb sent into use every 6 hours as needed.  He did have some decreased air movement in the right lower lobe and was given injection of Rocephin  today.  Chest x-ray ordered.  Continue Breztri  daily.  He does have hypoxemia at rest we will work on getting him set up for home oxygen concentrator to use as needed at home.

## 2023-12-14 NOTE — Assessment & Plan Note (Signed)
>>  ASSESSMENT AND PLAN FOR HYPOXEMIA WRITTEN ON 12/14/2023 10:42 PM BY MATTHEWS, CODY, DO  O2 saturations are 89% at rest on room air.  Improved to 94% with 2 L/min of O2.

## 2023-12-14 NOTE — Assessment & Plan Note (Signed)
 Cognition is a little sluggish today compared to his baseline.  Check urinalysis.  Likely related to his hypoxemia.

## 2023-12-14 NOTE — Patient Instructions (Addendum)
 Working on getting home oxygen set up.   You may use nebulizer with duoneb every 6 hours as needed.   Continue breztri    We'll be in touch with xray and lab results

## 2023-12-14 NOTE — Assessment & Plan Note (Signed)
>>  ASSESSMENT AND PLAN FOR PAF (PAROXYSMAL ATRIAL FIBRILLATION) (HCC) WRITTEN ON 12/14/2023 10:39 PM BY MATTHEWS, CODY, DO  Normal rate and rhythm on exam today.

## 2023-12-15 LAB — CMP14+EGFR
ALT: 20 IU/L (ref 0–44)
AST: 31 IU/L (ref 0–40)
Albumin: 3.9 g/dL (ref 3.6–4.6)
Alkaline Phosphatase: 66 IU/L (ref 48–129)
BUN/Creatinine Ratio: 20 (ref 10–24)
BUN: 37 mg/dL — ABNORMAL HIGH (ref 10–36)
Bilirubin Total: 0.4 mg/dL (ref 0.0–1.2)
CO2: 26 mmol/L (ref 20–29)
Calcium: 9.3 mg/dL (ref 8.6–10.2)
Chloride: 96 mmol/L (ref 96–106)
Creatinine, Ser: 1.85 mg/dL — ABNORMAL HIGH (ref 0.76–1.27)
Globulin, Total: 2.1 g/dL (ref 1.5–4.5)
Glucose: 80 mg/dL (ref 70–99)
Potassium: 4.6 mmol/L (ref 3.5–5.2)
Sodium: 138 mmol/L (ref 134–144)
Total Protein: 6 g/dL (ref 6.0–8.5)
eGFR: 33 mL/min/1.73 — ABNORMAL LOW (ref >59)

## 2023-12-15 LAB — CBC WITH DIFFERENTIAL/PLATELET
Basophils Absolute: 0.1 x10E3/uL (ref 0.0–0.2)
Basos: 1 %
EOS (ABSOLUTE): 0.4 x10E3/uL (ref 0.0–0.4)
Eos: 5 %
Hematocrit: 40.1 % (ref 37.5–51.0)
Hemoglobin: 12.7 g/dL — ABNORMAL LOW (ref 13.0–17.7)
Immature Grans (Abs): 0 x10E3/uL (ref 0.0–0.1)
Immature Granulocytes: 0 %
Lymphocytes Absolute: 2 x10E3/uL (ref 0.7–3.1)
Lymphs: 23 %
MCH: 30 pg (ref 26.6–33.0)
MCHC: 31.7 g/dL (ref 31.5–35.7)
MCV: 95 fL (ref 79–97)
Monocytes Absolute: 0.9 x10E3/uL (ref 0.1–0.9)
Monocytes: 11 %
Neutrophils Absolute: 5.3 x10E3/uL (ref 1.4–7.0)
Neutrophils: 60 %
Platelets: 262 x10E3/uL (ref 150–450)
RBC: 4.23 x10E6/uL (ref 4.14–5.80)
RDW: 15.1 % (ref 11.6–15.4)
WBC: 8.6 x10E3/uL (ref 3.4–10.8)

## 2023-12-15 LAB — MICROSCOPIC EXAMINATION

## 2023-12-15 LAB — BRAIN NATRIURETIC PEPTIDE: BNP: 62.8 pg/mL (ref 0.0–100.0)

## 2023-12-16 LAB — MICROSCOPIC EXAMINATION
Casts: NONE SEEN
Renal Epithel, UA: NONE SEEN /LPF

## 2023-12-16 LAB — URINALYSIS, ROUTINE W REFLEX MICROSCOPIC
Bilirubin, UA: NEGATIVE
Glucose, UA: NEGATIVE
Nitrite, UA: NEGATIVE
Specific Gravity, UA: 1.024 (ref 1.005–1.030)
Urobilinogen, Ur: 0.2 mg/dL (ref 0.2–1.0)
pH, UA: 5.5 (ref 5.0–7.5)

## 2023-12-16 LAB — URINE CULTURE

## 2023-12-20 ENCOUNTER — Ambulatory Visit: Admitting: Sports Medicine

## 2023-12-21 ENCOUNTER — Telehealth: Payer: Self-pay

## 2023-12-21 NOTE — Telephone Encounter (Signed)
 Copied from CRM [id number]. Topic: Referral - Status >> Dec 20, 2023  2:05 PM [place] MATSU wrote: Reason for CRM: patient called in requesting an update on referral status. Per patients daughter  [name] [facility] has reached out to her but they stating what was order was a regular home oxygen tank and not a portable on as patient uses walker and unable to haul both. Patients daughter [name] is requesting a call back to further discuss.   CB# [phone]   May you please assist.

## 2023-12-22 ENCOUNTER — Emergency Department (HOSPITAL_COMMUNITY)

## 2023-12-22 ENCOUNTER — Inpatient Hospital Stay (HOSPITAL_COMMUNITY)
Admission: EM | Admit: 2023-12-22 | Discharge: 2023-12-28 | DRG: 189 | Disposition: A | Discharge status: Skilled Nursing Facility | Source: Skilled Nursing Facility | Attending: Internal Medicine | Admitting: Internal Medicine

## 2023-12-22 ENCOUNTER — Other Ambulatory Visit: Payer: Self-pay

## 2023-12-22 DIAGNOSIS — G4700 Insomnia, unspecified: Secondary | ICD-10-CM | POA: Diagnosis not present

## 2023-12-22 DIAGNOSIS — J9622 Acute and chronic respiratory failure with hypercapnia: Secondary | ICD-10-CM | POA: Diagnosis present

## 2023-12-22 DIAGNOSIS — Z1152 Encounter for screening for COVID-19: Secondary | ICD-10-CM

## 2023-12-22 DIAGNOSIS — J9601 Acute respiratory failure with hypoxia: Secondary | ICD-10-CM | POA: Diagnosis present

## 2023-12-22 DIAGNOSIS — Z8379 Family history of other diseases of the digestive system: Secondary | ICD-10-CM

## 2023-12-22 DIAGNOSIS — F05 Delirium due to known physiological condition: Secondary | ICD-10-CM | POA: Diagnosis not present

## 2023-12-22 DIAGNOSIS — Z66 Do not resuscitate: Secondary | ICD-10-CM | POA: Diagnosis not present

## 2023-12-22 DIAGNOSIS — J439 Emphysema, unspecified: Secondary | ICD-10-CM | POA: Diagnosis not present

## 2023-12-22 DIAGNOSIS — J9621 Acute and chronic respiratory failure with hypoxia: Principal | ICD-10-CM | POA: Diagnosis present

## 2023-12-22 DIAGNOSIS — D6869 Other thrombophilia: Secondary | ICD-10-CM | POA: Diagnosis present

## 2023-12-22 DIAGNOSIS — Z87891 Personal history of nicotine dependence: Secondary | ICD-10-CM

## 2023-12-22 DIAGNOSIS — I4819 Other persistent atrial fibrillation: Secondary | ICD-10-CM | POA: Diagnosis present

## 2023-12-22 DIAGNOSIS — R062 Wheezing: Secondary | ICD-10-CM | POA: Diagnosis not present

## 2023-12-22 DIAGNOSIS — H9190 Unspecified hearing loss, unspecified ear: Secondary | ICD-10-CM | POA: Diagnosis present

## 2023-12-22 DIAGNOSIS — Z8249 Family history of ischemic heart disease and other diseases of the circulatory system: Secondary | ICD-10-CM

## 2023-12-22 DIAGNOSIS — F03911 Unspecified dementia, unspecified severity, with agitation: Secondary | ICD-10-CM | POA: Diagnosis not present

## 2023-12-22 DIAGNOSIS — E870 Hyperosmolality and hypernatremia: Secondary | ICD-10-CM | POA: Diagnosis not present

## 2023-12-22 DIAGNOSIS — Z809 Family history of malignant neoplasm, unspecified: Secondary | ICD-10-CM

## 2023-12-22 DIAGNOSIS — R1312 Dysphagia, oropharyngeal phase: Secondary | ICD-10-CM | POA: Diagnosis not present

## 2023-12-22 DIAGNOSIS — I5032 Chronic diastolic (congestive) heart failure: Secondary | ICD-10-CM | POA: Diagnosis present

## 2023-12-22 DIAGNOSIS — I10 Essential (primary) hypertension: Secondary | ICD-10-CM | POA: Diagnosis not present

## 2023-12-22 DIAGNOSIS — Z7901 Long term (current) use of anticoagulants: Secondary | ICD-10-CM

## 2023-12-22 DIAGNOSIS — R059 Cough, unspecified: Secondary | ICD-10-CM | POA: Diagnosis not present

## 2023-12-22 DIAGNOSIS — Z634 Disappearance and death of family member: Secondary | ICD-10-CM

## 2023-12-22 DIAGNOSIS — Z888 Allergy status to other drugs, medicaments and biological substances status: Secondary | ICD-10-CM

## 2023-12-22 DIAGNOSIS — E869 Volume depletion, unspecified: Secondary | ICD-10-CM | POA: Diagnosis not present

## 2023-12-22 DIAGNOSIS — R0602 Shortness of breath: Secondary | ICD-10-CM | POA: Diagnosis not present

## 2023-12-22 DIAGNOSIS — N1832 Chronic kidney disease, stage 3b: Secondary | ICD-10-CM | POA: Diagnosis present

## 2023-12-22 DIAGNOSIS — J441 Chronic obstructive pulmonary disease with (acute) exacerbation: Secondary | ICD-10-CM | POA: Diagnosis not present

## 2023-12-22 DIAGNOSIS — R1310 Dysphagia, unspecified: Secondary | ICD-10-CM | POA: Diagnosis present

## 2023-12-22 DIAGNOSIS — J440 Chronic obstructive pulmonary disease with acute lower respiratory infection: Secondary | ICD-10-CM | POA: Diagnosis present

## 2023-12-22 DIAGNOSIS — I4510 Unspecified right bundle-branch block: Secondary | ICD-10-CM | POA: Diagnosis present

## 2023-12-22 DIAGNOSIS — Z7951 Long term (current) use of inhaled steroids: Secondary | ICD-10-CM

## 2023-12-22 DIAGNOSIS — E860 Dehydration: Secondary | ICD-10-CM | POA: Diagnosis present

## 2023-12-22 DIAGNOSIS — R509 Fever, unspecified: Secondary | ICD-10-CM | POA: Diagnosis not present

## 2023-12-22 DIAGNOSIS — Z9889 Other specified postprocedural states: Secondary | ICD-10-CM

## 2023-12-22 DIAGNOSIS — R0902 Hypoxemia: Secondary | ICD-10-CM | POA: Diagnosis not present

## 2023-12-22 DIAGNOSIS — J189 Pneumonia, unspecified organism: Secondary | ICD-10-CM | POA: Diagnosis present

## 2023-12-22 DIAGNOSIS — Z23 Encounter for immunization: Secondary | ICD-10-CM

## 2023-12-22 DIAGNOSIS — F03918 Unspecified dementia, unspecified severity, with other behavioral disturbance: Secondary | ICD-10-CM | POA: Diagnosis not present

## 2023-12-22 DIAGNOSIS — R140 Abdominal distension (gaseous): Secondary | ICD-10-CM | POA: Diagnosis not present

## 2023-12-22 DIAGNOSIS — Z7401 Bed confinement status: Secondary | ICD-10-CM | POA: Diagnosis not present

## 2023-12-22 DIAGNOSIS — Z79890 Hormone replacement therapy (postmenopausal): Secondary | ICD-10-CM

## 2023-12-22 DIAGNOSIS — R918 Other nonspecific abnormal finding of lung field: Secondary | ICD-10-CM | POA: Diagnosis not present

## 2023-12-22 DIAGNOSIS — G2581 Restless legs syndrome: Secondary | ICD-10-CM | POA: Diagnosis present

## 2023-12-22 DIAGNOSIS — Z881 Allergy status to other antibiotic agents status: Secondary | ICD-10-CM

## 2023-12-22 DIAGNOSIS — M1990 Unspecified osteoarthritis, unspecified site: Secondary | ICD-10-CM | POA: Diagnosis present

## 2023-12-22 DIAGNOSIS — Z9181 History of falling: Secondary | ICD-10-CM

## 2023-12-22 DIAGNOSIS — Z79899 Other long term (current) drug therapy: Secondary | ICD-10-CM

## 2023-12-22 DIAGNOSIS — Z833 Family history of diabetes mellitus: Secondary | ICD-10-CM

## 2023-12-22 DIAGNOSIS — R600 Localized edema: Secondary | ICD-10-CM | POA: Diagnosis present

## 2023-12-22 DIAGNOSIS — R41841 Cognitive communication deficit: Secondary | ICD-10-CM | POA: Diagnosis not present

## 2023-12-22 DIAGNOSIS — R0989 Other specified symptoms and signs involving the circulatory and respiratory systems: Secondary | ICD-10-CM | POA: Diagnosis not present

## 2023-12-22 DIAGNOSIS — R2681 Unsteadiness on feet: Secondary | ICD-10-CM | POA: Diagnosis not present

## 2023-12-22 DIAGNOSIS — J309 Allergic rhinitis, unspecified: Secondary | ICD-10-CM | POA: Diagnosis not present

## 2023-12-22 DIAGNOSIS — M6281 Muscle weakness (generalized): Secondary | ICD-10-CM | POA: Diagnosis not present

## 2023-12-22 DIAGNOSIS — I130 Hypertensive heart and chronic kidney disease with heart failure and stage 1 through stage 4 chronic kidney disease, or unspecified chronic kidney disease: Secondary | ICD-10-CM | POA: Diagnosis present

## 2023-12-22 DIAGNOSIS — Z811 Family history of alcohol abuse and dependence: Secondary | ICD-10-CM

## 2023-12-22 DIAGNOSIS — N179 Acute kidney failure, unspecified: Secondary | ICD-10-CM | POA: Diagnosis present

## 2023-12-22 DIAGNOSIS — M170 Bilateral primary osteoarthritis of knee: Secondary | ICD-10-CM

## 2023-12-22 DIAGNOSIS — E611 Iron deficiency: Secondary | ICD-10-CM | POA: Diagnosis not present

## 2023-12-22 LAB — CBC WITH DIFFERENTIAL/PLATELET
Abs Immature Granulocytes: 0.02 K/uL (ref 0.00–0.07)
Basophils Absolute: 0.1 K/uL (ref 0.0–0.1)
Basophils Relative: 1 %
Eosinophils Absolute: 0.8 K/uL — ABNORMAL HIGH (ref 0.0–0.5)
Eosinophils Relative: 7 %
HCT: 43.6 % (ref 39.0–52.0)
Hemoglobin: 13.4 g/dL (ref 13.0–17.0)
Immature Granulocytes: 0 %
Lymphocytes Relative: 39 %
Lymphs Abs: 4.7 K/uL — ABNORMAL HIGH (ref 0.7–4.0)
MCH: 29.5 pg (ref 26.0–34.0)
MCHC: 30.7 g/dL (ref 30.0–36.0)
MCV: 95.8 fL (ref 80.0–100.0)
Monocytes Absolute: 1.2 K/uL — ABNORMAL HIGH (ref 0.1–1.0)
Monocytes Relative: 10 %
Neutro Abs: 5.3 K/uL (ref 1.7–7.7)
Neutrophils Relative %: 43 %
Platelets: 282 K/uL (ref 150–400)
RBC: 4.55 MIL/uL (ref 4.22–5.81)
RDW: 14.9 % (ref 11.5–15.5)
WBC: 12.1 K/uL — ABNORMAL HIGH (ref 4.0–10.5)
nRBC: 0 % (ref 0.0–0.2)

## 2023-12-22 LAB — COMPREHENSIVE METABOLIC PANEL WITH GFR
ALT: 17 U/L (ref 0–44)
AST: 44 U/L — ABNORMAL HIGH (ref 15–41)
Albumin: 4.4 g/dL (ref 3.5–5.0)
Alkaline Phosphatase: 65 U/L (ref 38–126)
Anion gap: 9 (ref 5–15)
BUN: 41 mg/dL — ABNORMAL HIGH (ref 8–23)
CO2: 33 mmol/L — ABNORMAL HIGH (ref 22–32)
Calcium: 9.7 mg/dL (ref 8.9–10.3)
Chloride: 103 mmol/L (ref 98–111)
Creatinine, Ser: 1.81 mg/dL — ABNORMAL HIGH (ref 0.61–1.24)
GFR, Estimated: 34 mL/min — ABNORMAL LOW (ref >60)
Glucose, Bld: 129 mg/dL — ABNORMAL HIGH (ref 70–99)
Potassium: 4.6 mmol/L (ref 3.5–5.1)
Sodium: 146 mmol/L — ABNORMAL HIGH (ref 135–145)
Total Bilirubin: 0.4 mg/dL (ref 0.0–1.2)
Total Protein: 6.9 g/dL (ref 6.5–8.1)

## 2023-12-22 LAB — BLOOD GAS, VENOUS
Acid-Base Excess: 5.9 mmol/L — ABNORMAL HIGH (ref 0.0–2.0)
Acid-Base Excess: 6 mmol/L — ABNORMAL HIGH (ref 0.0–2.0)
Bicarbonate: 35.9 mmol/L — ABNORMAL HIGH (ref 20.0–28.0)
Bicarbonate: 33.3 mmol/L — ABNORMAL HIGH (ref 20.0–28.0)
O2 Saturation: 57 %
O2 Saturation: 85.1 %
Patient temperature: 37
Patient temperature: 37
pCO2, Ven: 80 mmHg (ref 44–60)
pCO2, Ven: 59 mmHg (ref 44–60)
pH, Ven: 7.26 (ref 7.25–7.43)
pH, Ven: 7.36 (ref 7.25–7.43)
pO2, Ven: 33 mmHg (ref 32–45)
pO2, Ven: 51 mmHg — ABNORMAL HIGH (ref 32–45)

## 2023-12-22 LAB — RESP PANEL BY RT-PCR (RSV, FLU A&B, COVID)  RVPGX2
Influenza A by PCR: NEGATIVE
Influenza B by PCR: NEGATIVE
Resp Syncytial Virus by PCR: NEGATIVE
SARS Coronavirus 2 by RT PCR: NEGATIVE

## 2023-12-22 LAB — PRO BRAIN NATRIURETIC PEPTIDE: Pro Brain Natriuretic Peptide: 1034 pg/mL — ABNORMAL HIGH (ref <300.0)

## 2023-12-22 LAB — TROPONIN T, HIGH SENSITIVITY
Troponin T High Sensitivity: 97 ng/L — ABNORMAL HIGH (ref 0–19)
Troponin T High Sensitivity: 86 ng/L — ABNORMAL HIGH (ref 0–19)

## 2023-12-22 LAB — I-STAT CG4 LACTIC ACID, ED
Lactic Acid, Venous: 0.9 mmol/L (ref 0.5–1.9)
Lactic Acid, Venous: 1.6 mmol/L (ref 0.5–1.9)

## 2023-12-22 MED ORDER — ASPIRIN 81 MG PO CHEW: 324.0000 mg | CHEWABLE_TABLET | Freq: Once | ORAL | Status: DC

## 2023-12-22 MED ORDER — SODIUM CHLORIDE 0.9 % IV SOLN
1.0000 g | INTRAVENOUS | Status: DC
  Administered 2023-12-23 – 2023-12-27 (×5): 1 g via INTRAVENOUS
  Filled 2023-12-22 (×5): qty 10

## 2023-12-22 MED ORDER — AZITHROMYCIN 250 MG PO TABS
500.0000 mg | ORAL_TABLET | Freq: Every day | ORAL | Status: DC
  Administered 2023-12-23: 500 mg via ORAL
  Filled 2023-12-22 (×2): qty 2

## 2023-12-22 MED ORDER — ACETAMINOPHEN 500 MG PO TABS
1000.0000 mg | ORAL_TABLET | Freq: Four times a day (QID) | ORAL | Status: DC | PRN
  Administered 2023-12-24 – 2023-12-27 (×4): 1000 mg via ORAL
  Filled 2023-12-22 (×4): qty 2

## 2023-12-22 MED ORDER — AMIODARONE HCL 200 MG PO TABS
200.0000 mg | ORAL_TABLET | Freq: Every day | ORAL | Status: DC
  Administered 2023-12-22 – 2023-12-28 (×6): 200 mg via ORAL
  Filled 2023-12-22 (×7): qty 1

## 2023-12-22 MED ORDER — ALBUTEROL SULFATE (2.5 MG/3ML) 0.083% IN NEBU
15.0000 mg/h | INHALATION_SOLUTION | Freq: Once | RESPIRATORY_TRACT | Status: AC
  Administered 2023-12-22: 15 mg/h via RESPIRATORY_TRACT
  Filled 2023-12-22: qty 18

## 2023-12-22 MED ORDER — SODIUM CHLORIDE 0.9 % IV SOLN
1.0000 g | Freq: Once | INTRAVENOUS | Status: AC
  Administered 2023-12-22: 1 g via INTRAVENOUS
  Filled 2023-12-22: qty 10

## 2023-12-22 MED ORDER — IPRATROPIUM BROMIDE 0.02 % IN SOLN
0.5000 mg | RESPIRATORY_TRACT | Status: AC
  Administered 2023-12-22 (×2): 0.5 mg via RESPIRATORY_TRACT
  Filled 2023-12-22 (×2): qty 2.5

## 2023-12-22 MED ORDER — ONDANSETRON HCL 4 MG/2ML IJ SOLN: 4.0000 mg | Freq: Four times a day (QID) | INTRAMUSCULAR | Status: DC | PRN

## 2023-12-22 MED ORDER — ONDANSETRON HCL 4 MG PO TABS: 4.0000 mg | ORAL_TABLET | Freq: Four times a day (QID) | ORAL | Status: DC | PRN

## 2023-12-22 MED ORDER — ROPINIROLE HCL 0.5 MG PO TABS
0.7500 mg | ORAL_TABLET | Freq: Every day | ORAL | Status: DC
  Administered 2023-12-22 – 2023-12-27 (×6): 0.75 mg via ORAL
  Filled 2023-12-22 (×7): qty 1

## 2023-12-22 MED ORDER — PREDNISONE 20 MG PO TABS
40.0000 mg | ORAL_TABLET | Freq: Every day | ORAL | Status: DC
  Administered 2023-12-23: 40 mg via ORAL
  Filled 2023-12-22 (×2): qty 2

## 2023-12-22 MED ORDER — HALOPERIDOL LACTATE 5 MG/ML IJ SOLN
2.5000 mg | Freq: Once | INTRAMUSCULAR | Status: AC
Start: 2023-12-22 — End: 2023-12-22
  Administered 2023-12-22: 2.5 mg via INTRAVENOUS
  Filled 2023-12-22: qty 1

## 2023-12-22 MED ORDER — MAGNESIUM SULFATE 2 GM/50ML IV SOLN
2.0000 g | Freq: Once | INTRAVENOUS | Status: AC
  Administered 2023-12-22: 2 g via INTRAVENOUS
  Filled 2023-12-22: qty 50

## 2023-12-22 MED ORDER — SODIUM CHLORIDE 0.9 % IV SOLN
500.0000 mg | Freq: Once | INTRAVENOUS | Status: AC
  Administered 2023-12-22: 500 mg via INTRAVENOUS
  Filled 2023-12-22: qty 5

## 2023-12-22 MED ORDER — ACETAMINOPHEN 650 MG RE SUPP: 650.0000 mg | Freq: Four times a day (QID) | RECTAL | Status: DC | PRN

## 2023-12-22 MED ORDER — LEVOTHYROXINE SODIUM 112 MCG PO TABS
137.0000 ug | ORAL_TABLET | Freq: Every day | ORAL | Status: DC
  Administered 2023-12-23 – 2023-12-28 (×5): 137 ug via ORAL
  Filled 2023-12-22 (×7): qty 1

## 2023-12-22 MED ORDER — APIXABAN 2.5 MG PO TABS
2.5000 mg | ORAL_TABLET | Freq: Two times a day (BID) | ORAL | Status: DC
Start: 2023-12-22 — End: 2023-12-24
  Administered 2023-12-22 – 2023-12-23 (×3): 2.5 mg via ORAL
  Filled 2023-12-22 (×4): qty 1

## 2023-12-22 MED ORDER — IPRATROPIUM-ALBUTEROL 0.5-2.5 (3) MG/3ML IN SOLN
3.0000 mL | Freq: Four times a day (QID) | RESPIRATORY_TRACT | Status: DC
  Administered 2023-12-22 – 2023-12-26 (×17): 3 mL via RESPIRATORY_TRACT
  Filled 2023-12-22 (×17): qty 3

## 2023-12-22 MED ORDER — INFLUENZA VAC SPLIT HIGH-DOSE 0.5 ML IM SUSY
0.5000 mL | PREFILLED_SYRINGE | INTRAMUSCULAR | Status: AC
Start: 2023-12-23 — End: 2023-12-27
  Administered 2023-12-27: 0.5 mL via INTRAMUSCULAR
  Filled 2023-12-22 (×2): qty 0.5

## 2023-12-22 NOTE — ED Notes (Signed)
 ED TO INPATIENT HANDOFF REPORT  Name/Age/Gender Samuel Mcconnell 89 y.o. male  Code Status    Code Status Orders  (From admission, onward)           Start     Ordered   12/22/23 1356  Do not attempt resuscitation (DNR)- Limited -Do Not Intubate (DNI)  Continuous       Question Answer Comment  If pulseless and not breathing No CPR or chest compressions.   In Pre-Arrest Conditions (Patient Is Breathing and Has A Pulse) Do not intubate. Provide all appropriate non-invasive medical interventions. Avoid ICU transfer unless indicated or required.   Consent: Discussion documented in EHR or advanced directives reviewed      12/22/23 1356           Code Status History     Date Active Date Inactive Code Status Order ID Comments User Context   12/22/2023 1125 12/22/2023 1356 Limited: Do not attempt resuscitation (DNR) -DNR-LIMITED -Do Not Intubate/DNI  [id number]  Dreama Longs, MD ED   05/09/2023 1444 05/11/2023 1715 Full Code [id number]  Zella Katha HERO, MD ED   10/25/2021 1809 10/29/2021 1739 Full Code [id number]  Fairy Frames, MD Inpatient   07/02/2021 1852 07/05/2021 1749 DNR [id number]  Jerri Keys, MD ED       Home/SNF/Other Skilled nursing facility  Chief Complaint Acute on chronic respiratory failure with hypoxia (HCC) [J96.21]  Level of Care/Admitting Diagnosis ED Disposition     ED Disposition  Admit   Condition  --   Comment  Hospital Area: [facility] [[id number]]  Level of Care: Progressive [102]  Admit to Progressive based on following criteria: RESPIRATORY PROBLEMS hypoxemic/hypercapnic respiratory failure that is responsive to NIPPV (BiPAP) or High Flow Nasal Cannula (6-80 lpm). Frequent assessment/intervention, no > Q2 hrs < Q4 hrs, to maintain oxygenation and pulmonary hygiene.  May admit patient to [facility] or [facility] if equivalent level of care is available:: Yes  Covid Evaluation: Asymptomatic - no recent exposure (last 10 days) testing  not required  Diagnosis: Acute on chronic respiratory failure with hypoxia [facility]) [[id number]]  Admitting Physician: BOBBETTE HERCULES [[id number]]  Attending Physician: BOBBETTE HERCULES [[id number]]  Certification:: I certify this patient will need inpatient services for at least 2 midnights  Expected Medical Readiness: 12/25/2023          Medical History Past Medical History:  Diagnosis Date   Arthritis    Atrial fibrillation [facility])    radiofrequency ablation   Cataract    CHF (congestive heart failure) (HCC)    Clotting disorder    due to medication   Emphysema    Emphysema of lung (HCC)    Hypertension    Hypoxemia 12/14/2023   Thyroid  disease    Ulcer    GI years prior    Allergies Allergies  Allergen Reactions   Clindamycin/Lincomycin Other (See Comments)    Patient felt weird    Dabigatran Etexilate Mesylate Other (See Comments)    felt weird (name brand Pradaxa)   Doxycycline  Other (See Comments)    Dyspepsia    IV Location/Drains/Wounds Patient Lines/Drains/Airways Status     Active Line/Drains/Airways     Name Placement date Placement time Site Days   Peripheral IV 12/22/23 20 G Left Antecubital 12/22/23  0932  Antecubital  less than 1   Peripheral IV 12/22/23 20 G Anterior;Distal;Right;Upper Antecubital 12/22/23  0950  Antecubital  less than 1            Labs/Imaging Results  for orders placed or performed during the hospital encounter of 12/22/23 (from the past 48 hours)  CBC with Differential     Status: Abnormal   Collection Time: 12/22/23  9:41 AM  Result Value Ref Range   WBC 12.1 (H) 4.0 - 10.5 K/uL   RBC 4.55 4.22 - 5.81 MIL/uL   Hemoglobin 13.4 13.0 - 17.0 g/dL   HCT 56.3 60.9 - 47.9 %   MCV 95.8 80.0 - 100.0 fL   MCH 29.5 26.0 - 34.0 pg   MCHC 30.7 30.0 - 36.0 g/dL   RDW 85.0 88.4 - 84.4 %   Platelets 282 150 - 400 K/uL   nRBC 0.0 0.0 - 0.2 %   Neutrophils Relative % 43 %   Neutro Abs 5.3 1.7 - 7.7 K/uL   Lymphocytes Relative  39 %   Lymphs Abs 4.7 (H) 0.7 - 4.0 K/uL   Monocytes Relative 10 %   Monocytes Absolute 1.2 (H) 0.1 - 1.0 K/uL   Eosinophils Relative 7 %   Eosinophils Absolute 0.8 (H) 0.0 - 0.5 K/uL   Basophils Relative 1 %   Basophils Absolute 0.1 0.0 - 0.1 K/uL   Immature Granulocytes 0 %   Abs Immature Granulocytes 0.02 0.00 - 0.07 K/uL    Comment: Performed at [facility], [address], [place], [place] [address]  Comprehensive metabolic panel     Status: Abnormal   Collection Time: 12/22/23  9:41 AM  Result Value Ref Range   Sodium 146 (H) 135 - 145 mmol/L   Potassium 4.6 3.5 - 5.1 mmol/L   Chloride 103 98 - 111 mmol/L   CO2 33 (H) 22 - 32 mmol/L   Glucose, Bld 129 (H) 70 - 99 mg/dL    Comment: Glucose reference range applies only to samples taken after fasting for at least 8 hours.   BUN 41 (H) 8 - 23 mg/dL   Creatinine, Ser 8.18 (H) 0.61 - 1.24 mg/dL   Calcium 9.7 8.9 - 89.6 mg/dL   Total Protein 6.9 6.5 - 8.1 g/dL   Albumin 4.4 3.5 - 5.0 g/dL   AST 44 (H) 15 - 41 U/L   ALT 17 0 - 44 U/L   Alkaline Phosphatase 65 38 - 126 U/L   Total Bilirubin 0.4 0.0 - 1.2 mg/dL   GFR, Estimated 34 (L) >60 mL/min    Comment: (NOTE) Calculated using the CKD-EPI Creatinine Equation (2021)    Anion gap 9 5 - 15    Comment: Performed at [facility], [address], [place], [place] [address]  Troponin T, High Sensitivity     Status: Abnormal   Collection Time: 12/22/23  9:41 AM  Result Value Ref Range   Troponin T High Sensitivity 97 (H) 0 - 19 ng/L    Comment: (NOTE) Biotin concentrations > 1000 ng/mL falsely decrease TnT results.  Serial cardiac troponin measurements are suggested.  Refer to the Links section for chest pain algorithms and additional  guidance. Performed at [facility], [address] [place], [place] [address]   Pro Brain natriuretic peptide     Status: Abnormal   Collection Time: 12/22/23  9:41 AM  Result Value  Ref Range   Pro Brain Natriuretic Peptide 1,034.0 (H) <300.0 pg/mL    Comment: (NOTE) Age Group        Cut-Points    Interpretation  < 50 years     450 pg/mL       NT-proBNP >  450 pg/mL indicates                                ADHF is likely              50 to 75 years  900 pg/mL      NT-proBNP > 900 pg/mL indicates          ADHF is likely  > 75 years      1800 pg/mL     NT-proBNP > 1800 pg/mL indicates          ADHF is likely                           All ages    Results between       Indeterminate. Further clinical             300 and the cut-   information is needed to determine            point for age group   if ADHF is present.                                                             Elecsys proBNP II/ Elecsys proBNP II STAT           Cut-Point                       Interpretation  300 pg/mL                    NT-proBNP <300pg/mL indicates                             ADHF is not likely  Performed at [facility], [address], [place], [place] [address]   Resp panel by RT-PCR (RSV, Flu A&B, Covid)     Status: None   Collection Time: 12/22/23  9:42 AM   Specimen: Nasal Swab  Result Value Ref Range   SARS Coronavirus 2 by RT PCR NEGATIVE NEGATIVE    Comment: (NOTE) SARS-CoV-2 target nucleic acids are NOT DETECTED.  The SARS-CoV-2 RNA is generally detectable in upper respiratory specimens during the acute phase of infection. The lowest concentration of SARS-CoV-2 viral copies this assay can detect is 138 copies/mL. A negative result does not preclude SARS-Cov-2 infection and should not be used as the sole basis for treatment or other patient management decisions. A negative result may occur with  improper specimen collection/handling, submission of specimen other than nasopharyngeal swab, presence of viral mutation(s) within the areas targeted by this assay, and inadequate number of viral copies(<138 copies/mL). A negative result must be  combined with clinical observations, patient history, and epidemiological information. The expected result is Negative.  Fact Sheet for Patients:  BloggerCourse.com  Fact Sheet for Healthcare Providers:  SeriousBroker.it  This test is no t yet approved or cleared by the [place]  FDA and  has been authorized for detection and/or diagnosis of SARS-CoV-2 by FDA under an Emergency Use Authorization (EUA). This EUA will remain  in effect (meaning this test can be used) for the duration  of the COVID-19 declaration under Section 564(b)(1) of the Act, 21 U.S.C.section 360bbb-3(b)(1), unless the authorization is terminated  or revoked sooner.       Influenza A by PCR NEGATIVE NEGATIVE   Influenza B by PCR NEGATIVE NEGATIVE    Comment: (NOTE) The Xpert Xpress SARS-CoV-2/FLU/RSV plus assay is intended as an aid in the diagnosis of influenza from Nasopharyngeal swab specimens and should not be used as a sole basis for treatment. Nasal washings and aspirates are unacceptable for Xpert Xpress SARS-CoV-2/FLU/RSV testing.  Fact Sheet for Patients: BloggerCourse.com  Fact Sheet for Healthcare Providers: SeriousBroker.it  This test is not yet approved or cleared by the [place]  FDA and has been authorized for detection and/or diagnosis of SARS-CoV-2 by FDA under an Emergency Use Authorization (EUA). This EUA will remain in effect (meaning this test can be used) for the duration of the COVID-19 declaration under Section 564(b)(1) of the Act, 21 U.S.C. section 360bbb-3(b)(1), unless the authorization is terminated or revoked.     Resp Syncytial Virus by PCR NEGATIVE NEGATIVE    Comment: (NOTE) Fact Sheet for Patients: BloggerCourse.com  Fact Sheet for Healthcare Providers: SeriousBroker.it  This test is not yet approved or cleared  by the [place]  FDA and has been authorized for detection and/or diagnosis of SARS-CoV-2 by FDA under an Emergency Use Authorization (EUA). This EUA will remain in effect (meaning this test can be used) for the duration of the COVID-19 declaration under Section 564(b)(1) of the Act, 21 U.S.C. section 360bbb-3(b)(1), unless the authorization is terminated or revoked.  Performed at [facility], [address], [place], [place] [address]   I-Stat CG4 Lactic Acid     Status: None   Collection Time: 12/22/23  9:52 AM  Result Value Ref Range   Lactic Acid, Venous 0.9 0.5 - 1.9 mmol/L  Troponin T, High Sensitivity     Status: Abnormal   Collection Time: 12/22/23 12:25 PM  Result Value Ref Range   Troponin T High Sensitivity 86 (H) 0 - 19 ng/L    Comment: (NOTE) Biotin concentrations > 1000 ng/mL falsely decrease TnT results.  Serial cardiac troponin measurements are suggested.  Refer to the Links section for chest pain algorithms and additional  guidance. Performed at [facility], [address], [place], [place] [address]   I-Stat CG4 Lactic Acid     Status: None   Collection Time: 12/22/23 12:32 PM  Result Value Ref Range   Lactic Acid, Venous 1.6 0.5 - 1.9 mmol/L  Blood gas, venous     Status: Abnormal   Collection Time: 12/22/23  1:56 PM  Result Value Ref Range   pH, Ven 7.26 7.25 - 7.43   pCO2, Ven 80 (HH) 44 - 60 mmHg    Comment: CRITICAL RESULT CALLED TO, READ BACK BY AND VERIFIED WITH: KISER,C. RN AT 1411 12/22/23 MULLINS,T    pO2, Ven 33 32 - 45 mmHg   Bicarbonate 35.9 (H) 20.0 - 28.0 mmol/L   Acid-Base Excess 5.9 (H) 0.0 - 2.0 mmol/L   O2 Saturation 57 %   Patient temperature 37.0     Comment: Performed at [facility], [address], [place], [place] [address]   CT CHEST WO CONTRAST Result Date: 12/22/2023 CLINICAL DATA:  Pneumonia, complication suspected, xray done EXAM: CT CHEST WITHOUT CONTRAST  TECHNIQUE: Multidetector CT imaging of the chest was performed following the standard protocol without IV contrast. RADIATION DOSE REDUCTION: This exam was performed according to  the departmental dose-optimization program which includes automated exposure control, adjustment of the mA and/or kV according to patient size and/or use of iterative reconstruction technique. COMPARISON:  CT angiography chest from 07/02/2021. FINDINGS: Cardiovascular: Normal cardiac size. No pericardial effusion. No aortic aneurysm. There are coronary artery calcifications, in keeping with coronary artery disease. There are also moderate peripheral atherosclerotic vascular calcifications of thoracic aorta and its major branches. Mediastinum/Nodes: Small/atrophic thyroid  gland, appears more atrophic in comparison the prior study. No solid / cystic mediastinal masses. The esophagus is nondistended precluding optimal assessment. There are few mildly prominent mediastinal lymph nodes, which do not meet the size criteria for lymphadenopathy and appear grossly similar to the prior study, favoring benign etiology. No axillary lymphadenopathy by size criteria. Evaluation of bilateral hila is limited due to lack on intravenous contrast: however, no large hilar lymphadenopathy identified. Lungs/Pleura: The central tracheo-bronchial tree is patent. Mild upper lobe predominant centrilobular emphysematous changes noted. Redemonstration of nodular partially calcified pleural thickening along the anterior aspect of the left upper lobe, similar to the prior study. No new mass or consolidation. No pleural effusion or pneumothorax. No suspicious lung nodules. Upper Abdomen: There is diffuse hypoattenuation of the liver, which is nonspecific but can be seen with medication such as amiodarone . There is a single peripherally rim calcified gallstone without imaging signs of acute cholecystitis. 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 There is air in the nondilated main pancreatic duct in the head, which is nonspecific but can be seen with sphincterotomy. Remaining visualized upper abdominal viscera within normal limits. Musculoskeletal: The visualized soft tissues of the chest wall are grossly unremarkable. No suspicious osseous lesions. There are mild to moderate multilevel degenerative changes in the visualized spine. IMPRESSION: 1. No acute lung disease. No lung mass, consolidation, pleural effusion or pneumothorax. 2. Multiple other nonacute observations, as described above. Aortic Atherosclerosis (ICD10-I70.0) and Emphysema (ICD10-J43.9). Electronically Signed   By: Ree Molt M.D.   On: 12/22/2023 13:59   DG Chest Portable 1 View Result Date: 12/22/2023 CLINICAL DATA:  89 year old male with shortness of breath. EXAM: PORTABLE CHEST 1 VIEW COMPARISON:  Chest radiographs 12/14/2023 and earlier. FINDINGS: Portable AP semi upright view at 0950 hours. Lung volumes not significantly changed. Stable cardiac size and mediastinal contours. Calcified aortic atherosclerosis. Visualized tracheal air column is within normal limits. Increased bilateral patchy and indistinct perihilar lung opacity more apparent on the right. No pneumothorax. No pulmonary edema. No pleural effusion or consolidation identified. Underlying emphysema demonstrated by CT in 2023. Stable visualized osseous structures.  Paucity of bowel gas. IMPRESSION: Emphysema (ICD10-J43.9). Patchy new or increased left > right perihilar opacity since 12/14/2023 suspicious for acute infectious exacerbation. No consolidation or pleural effusion evident. Electronically Signed   By: VEAR Hurst M.D.   On: 12/22/2023 09:58    Pending Labs Unresulted Labs (From admission, onward)     Start     Ordered   12/23/23 0500  Blood gas, venous  Tomorrow morning,   R        12/22/23 1808   12/22/23 1847  Blood gas, venous  Once,   R        12/22/23 1847   12/22/23 0941  Blood culture  (routine x 2)  BLOOD CULTURE X 2,   R (with STAT occurrences)      12/22/23 0941            Vitals/Pain Today's Vitals   12/22/23 1514 12/22/23 1700 12/22/23 1800 12/22/23  1858  BP:  (!) 121/59 116/66   Pulse:  90 86   Resp:  17 (!) 24   Temp: 98.9 F (37.2 C)   100.3 F (37.9 C)  TempSrc: Axillary   Axillary  SpO2:  100% 96%   PainSc:        Isolation Precautions No active isolations  Medications Medications  aspirin  chewable tablet 324 mg (324 mg Oral Not Given 12/22/23 1226)  amiodarone  (PACERONE ) tablet 200 mg (200 mg Oral Given 12/22/23 1610)  levothyroxine  (SYNTHROID ) tablet 137 mcg (has no administration in time range)  apixaban  (ELIQUIS ) tablet 2.5 mg (2.5 mg Oral Given 12/22/23 1610)  rOPINIRole  (REQUIP ) tablet 0.75 mg (has no administration in time range)  predniSONE  (DELTASONE ) tablet 40 mg (has no administration in time range)  ipratropium-albuterol  (DUONEB) 0.5-2.5 (3) MG/3ML nebulizer solution 3 mL (3 mLs Nebulization Given 12/22/23 1518)  acetaminophen  (TYLENOL ) tablet 1,000 mg (has no administration in time range)    Or  acetaminophen  (TYLENOL ) suppository 650 mg (has no administration in time range)  ondansetron  (ZOFRAN ) tablet 4 mg (has no administration in time range)    Or  ondansetron  (ZOFRAN ) injection 4 mg (has no administration in time range)  albuterol  (PROVENTIL ) (2.5 MG/3ML) 0.083% nebulizer solution (15 mg/hr Nebulization Given 12/22/23 0952)  ipratropium (ATROVENT ) nebulizer solution 0.5 mg (0.5 mg Nebulization Given 12/22/23 1003)  magnesium sulfate IVPB 2 g 50 mL (0 g Intravenous Stopped 12/22/23 1032)  cefTRIAXone  (ROCEPHIN ) 1 g in sodium chloride  0.9 % 100 mL IVPB (0 g Intravenous Stopped 12/22/23 1110)  azithromycin  (ZITHROMAX ) 500 mg in sodium chloride  0.9 % 250 mL IVPB (0 mg Intravenous Stopped 12/22/23 1226)  haloperidol lactate (HALDOL) injection 2.5 mg (2.5 mg Intravenous Given 12/22/23 1821)    Mobility walks with person assist

## 2023-12-22 NOTE — Assessment & Plan Note (Addendum)
 12/22/23 hold lasix  due to hypernatremia.

## 2023-12-22 NOTE — Assessment & Plan Note (Addendum)
 12/22/23 admit to progressive telemetry bed.  Patient had been on BiPAP initially in the ER.  He has subsequently been taken off.  He has an elevated serum bicarbonate on his chemistry.  Will check a VBG to see if he is retaining CO2.  He probably is.  Daughter states that the patient's home oxygen was never delivered last week when was ordered.  He will need a home oxygen evaluation and case management to help him secure home portable O2.

## 2023-12-22 NOTE — ED Notes (Signed)
 Dr. Camellia Door stated for patient to be put back on bipap. Respiratory called.

## 2023-12-22 NOTE — Progress Notes (Signed)
 Rt set up BIPAP in ED for respiratory distress.

## 2023-12-22 NOTE — ED Notes (Signed)
 Pt repositioned and Dr hollace for agitation meds.

## 2023-12-22 NOTE — H&P (Signed)
 History and Physical    Samuel Mcconnell FMW:[id number] DOB: Aug 27, 1927 DOA: 12/22/2023  DOS: the patient was seen and examined on 12/22/2023  PCP: Alvia Bring, DO   [facility] from: [facility]  I have personally briefly reviewed patient's old medical records in [facility]  CC: cough, SOB, hypoxia HPI:  89 year old Caucasian male with a history of COPD, diastolic heart failure, A-fib on Eliquis , recent need for home oxygen, CKD stage IIIb/IV, who presents to the ER today via EMS due to cough, shortness of breath and hypoxia.  Daughter states that he was seen last week by his PCP and started on home oxygen due to hypoxia noted in the ER.  His home oxygen was never delivered.  He has been having progressive shortness of breath over the last 2 to 3 days.  He lives at [facility] in independent living.  Daughter states that his independent living has been doing renovations to the building including tearing up old carpet that has mold.  When EMS arrived, he was in obvious shortness of breath.  Family stated that he was hypoxic with room air saturations in the 70s.  He was placed on supplemental oxygen.  He was given a DuoNeb and given IV Solu-Medrol .  When he arrived in the ER, he was in obvious respiratory extremis and was placed immediately on BiPAP.  Temp 98.9 heart rate 86, blood pressure 172/86.  BiPAP at 28% FiO2.  White count 12.1, hemoglobin 13.4, platelet 282  proBNP of 1034  Sodium 146, potassium 4.6, bicarb 33, BUN 41, creatinine 1.8, glucose 129  Total protein 6.9, albumin 4.4, AST 44, ALT of 17, alk phos of 65, total bili 0.4  COVID-negative, RSV negative, influenza negative  Lactic acid of 1.6  Initial troponin of 97, second troponin of 86  Chest x-ray demonstrated emphysema with new perihilar opacities left greater than right since December 14, 2023.   Significant Events: Admitted 12/22/2023 acute on chronic respiratory  failure. 12-22-2023 pt made DNR/DNI by EDP after discussion with pt's dtr Samuel Mcconnell at bedside.  Admission Labs: White count 12.1, hemoglobin 13.4, platelet 282 proBNP of 1034 Sodium 146, potassium 4.6, bicarb 33, BUN 41, creatinine 1.8, glucose 129 Total protein 6.9, albumin 4.4, AST 44, ALT of 17, alk phos of 65, total bili 0.4 COVID-negative, RSV negative, influenza negative Lactic acid of 1.6 Initial troponin of 97, second troponin of 86  Admission Imaging Studies: CXR Emphysema (ICD10-J43.9). Patchy new or increased left > right perihilar opacity since 12/14/2023 suspicious for acute infectious exacerbation. No consolidation or pleural effusion evident.  Significant Labs:   Significant Imaging Studies:   Antibiotic Therapy: Anti-infectives (From admission, onward)    Start     Dose/Rate Route Frequency Ordered Stop   12/22/23 1030  cefTRIAXone  (ROCEPHIN ) 1 g in sodium chloride  0.9 % 100 mL IVPB        1 g 200 mL/hr over 30 Minutes Intravenous  Once 12/22/23 1015 12/22/23 1110   12/22/23 1030  azithromycin  (ZITHROMAX ) 500 mg in sodium chloride  0.9 % 250 mL IVPB        500 mg 250 mL/hr over 60 Minutes Intravenous  Once 12/22/23 1015 12/22/23 1226       Procedures:   Consultants:     ED Course: started on Bipap. Given nebulizer treatment. Given IV rocephin /zithromax .  Review of Systems:  Review of Systems  Unable to perform ROS: Acuity of condition    Past Medical History:  Diagnosis Date  Arthritis    Atrial fibrillation (HCC)    radiofrequency ablation   Cataract    CHF (congestive heart failure) (HCC)    Clotting disorder    due to medication   Emphysema    Emphysema of lung (HCC)    Hypertension    Hypoxemia 12/14/2023   Thyroid  disease    Ulcer    GI years prior    Past Surgical History:  Procedure Laterality Date   CARDIAC SURGERY     CARDIOVERSION N/A 10/24/2019   Procedure: CARDIOVERSION;  Surgeon: Francyne Headland, MD;  Location: [facility]  ENDOSCOPY;  Service: Cardiovascular;  Laterality: N/A;   CARDIOVERSION N/A 10/22/2020   Procedure: CARDIOVERSION;  Surgeon: Alveta Aleene PARAS, MD;  Location: [facility] ENDOSCOPY;  Service: Cardiovascular;  Laterality: N/A;   COLON SURGERY  03/23/1997   EYE SURGERY     KNEE ARTHROSCOPY Left 07/21/2000   LOOP RECORDER IMPLANT  01/16/2010   Medtronic Reveal XT (Dr. HILARIO Jester)    RADIOFREQUENCY ABLATION  06/13/2009   SHOULDER ARTHROSCOPY WITH ROTATOR CUFF REPAIR Left 04/23/1996   TIBIAL TUBERCLERPLASTY  ~ 10/2010   TRANSTHORACIC ECHOCARDIOGRAM  11/19/2011   EF=>55%; mild MR, mild mitral annular calcif; mild TR, normal RSVP; AV mildly sclerotic, mild AV regurg; trace pulm valve regurg      reports that he quit smoking about 21 years ago. His smoking use included cigarettes. He started smoking about 67 years ago. He has never used smokeless tobacco. He reports that he does not currently use alcohol after a past usage of about 1.0 standard drink of alcohol per week. He reports that he does not use drugs.  Allergies  Allergen Reactions   Clindamycin/Lincomycin Other (See Comments)    Patient felt weird    Dabigatran Etexilate Mesylate Other (See Comments)    felt weird (name brand Pradaxa)   Doxycycline  Other (See Comments)    Dyspepsia    Family History  Problem Relation Age of Onset   Heart failure Mother    Heart failure Father        MI - died @ 28   Atrial fibrillation Sister    Hypertension Sister    Heart Problems Sister    Diabetes Sister    Heart failure Sister    Atrial fibrillation Brother        dx'ed age 88   Atrial fibrillation Brother        dx'ed age 8   Heart failure Brother    Diabetes Son    Pancreatitis Son        also ETOH abuse    Cancer Maternal Grandmother    Heart disease Maternal Grandfather     Prior to Admission medications   Medication Sig Start Date End Date Taking? Authorizing Provider  acetaminophen  (TYLENOL ) 500 MG tablet Take 500 mg by mouth  daily as needed for mild pain (pain score 1-3), moderate pain (pain score 4-6) or headache.    [provider]  albuterol  (VENTOLIN  HFA) 108 (90 Base) MCG/ACT inhaler Inhale 2 puffs into the lungs every 6 (six) hours as needed for wheezing or shortness of breath. 08/23/23   Willo Mini, NP  AMBULATORY NON FORMULARY MEDICATION Please provide toilet riser/elevator with arms.  Diagnosis: Generalized weakness R53.1 07/11/21   Alvia Bring, DO  AMBULATORY NON FORMULARY MEDICATION Please provide home oxygen concentrator.  Flow 2 LPM O2 on RA at rest-89% O2 on 2LPM O2 at rest- 94% 12/14/23   Alvia Bring, DO  amiodarone  (PACERONE ) 200 MG  tablet Take 1 tablet (200 mg total) by mouth daily. 03/19/23   Croitoru, Mihai, MD  apixaban  (ELIQUIS ) 2.5 MG TABS tablet Take 1 tablet (2.5 mg total) by mouth 2 (two) times daily. 10/27/23   Croitoru, Mihai, MD  BREZTRI  AEROSPHERE 160-9-4.8 MCG/ACT AERO inhaler INHALE 2 PUFFS INTO THE LUNGS 2 TIMES DAILY 10/28/23   Alvia Bring, DO  Cholecalciferol  (VITAMIN D3) 125 MCG (5000 UT) TABS Take 5,000 Units by mouth daily with lunch.    [provider]  diclofenac Sodium (VOLTAREN) 1 % GEL Apply 2 g topically 4 (four) times daily as needed (pain).    [provider]  diphenhydramine -acetaminophen  (TYLENOL  PM) 25-500 MG TABS tablet Take 1 tablet by mouth at bedtime as needed (for sleeplessness).    [provider]  fluticasone  (FLONASE ) 50 MCG/ACT nasal spray Place 2 sprays into both nostrils daily as needed for allergies or rhinitis. 08/23/23 12/14/23  Willo Mini, NP  fluticasone -salmeterol (ADVAIR DISKUS) 250-50 MCG/ACT AEPB Inhale 1 puff into the lungs in the morning and at bedtime. 11/02/23   Alvia Bring, DO  furosemide  (LASIX ) 40 MG tablet Take 1 tablet (40 mg total) by mouth daily. 06/11/23 12/14/23  Croitoru, Mihai, MD  ipratropium-albuterol  (DUONEB) 0.5-2.5 (3) MG/3ML SOLN Take 3 mLs by nebulization every 6 (six) hours as needed. 12/14/23    Alvia Bring, DO  levothyroxine  (SYNTHROID ) 137 MCG tablet Take 1 tablet (137 mcg total) by mouth daily before breakfast. 08/23/23   Willo Mini, NP  Multiple Vitamin (MULTIVITAMIN) capsule Take 1 capsule by mouth daily with breakfast.    [provider]  NON FORMULARY Take 2 tablets by mouth See admin instructions. SuperBeets Heart Chews- Chew 2 tablets by mouth once a day    [provider]  potassium chloride  (KLOR-CON ) 10 MEQ tablet Take 1 tablet (10 mEq total) by mouth daily. 11/29/23 02/27/24  Croitoru, Mihai, MD  rOPINIRole  (REQUIP ) 0.25 MG tablet Take 3 tablets (0.75 mg total) by mouth at bedtime. 08/23/23 12/14/23  Willo Mini, NP  Spacer/Aero-Holding Raguel FRENCH Use with inhaler. 12/14/23   Alvia Bring, DO  traMADol  (ULTRAM ) 50 MG tablet Take 0.5-1 tablets (25-50 mg total) by mouth every 8 (eight) hours as needed for moderate pain (pain score 4-6). 11/01/23   Curtis Debby PARAS, MD  triamcinolone  ointment (KENALOG ) 0.1 % Apply 1 Application topically 2 (two) times daily as needed (irritation).    [provider]  zinc gluconate 50 MG tablet Take 50 mg by mouth daily with lunch.    [provider]    Physical Exam: Vitals:   12/22/23 0955 12/22/23 1000 12/22/23 1127 12/22/23 1300  BP:  (!) 180/87 (!) 108/58 (!) 126/58  Pulse: 86 80 81 79  Resp: (!) 25 (!) 25 (!) 21 (!) 22  Temp:   (!) 97.5 F (36.4 C)   TempSrc:   Axillary   SpO2:  100% 100% 98%    Physical Exam Vitals and nursing note reviewed.  Constitutional:      Comments: Awake, hard of hearing. On O2 Elderly, chronically ill appearing  HENT:     Head: Normocephalic and atraumatic.  Eyes:     General: No scleral icterus. Cardiovascular:     Rate and Rhythm: Normal rate. Rhythm irregular.  Pulmonary:     Effort: Pulmonary effort is normal. No respiratory distress.     Comments: Diminished BS bilaterally Abdominal:     General: Bowel sounds are normal. There is no distension.      Palpations:  Abdomen is soft.  Musculoskeletal:     Right lower leg: Edema present.     Left lower leg: Edema present.     Comments: +1 pitting edema of bilateral lower legs, ankles and feet. No thigh edema  Skin:    General: Skin is warm and dry.     Capillary Refill: Capillary refill takes less than 2 seconds.  Neurological:     Mental Status: He is oriented to person, place, and time.     Comments: Hard of hearing      Labs on Admission: I have personally reviewed following labs and imaging studies  CBC: Recent Labs  Lab 12/22/23 0941  WBC 12.1*  NEUTROABS 5.3  HGB 13.4  HCT 43.6  MCV 95.8  PLT 282   Basic Metabolic Panel: Recent Labs  Lab 12/22/23 0941  NA 146*  K 4.6  CL 103  CO2 33*  GLUCOSE 129*  BUN 41*  CREATININE 1.81*  CALCIUM 9.7   GFR: Estimated Creatinine Clearance: 27 mL/min (A) (by C-G formula based on SCr of 1.81 mg/dL (H)). Liver Function Tests: Recent Labs  Lab 12/22/23 0941  AST 44*  ALT 17  ALKPHOS 65  BILITOT 0.4  PROT 6.9  ALBUMIN 4.4   BNP (last 3 results) Recent Labs    05/09/23 1259 12/14/23 1216  BNP 130.2* 62.8   Radiological Exams on Admission: I have personally reviewed images DG Chest Portable 1 View Result Date: 12/22/2023 CLINICAL DATA:  89 year old male with shortness of breath. EXAM: PORTABLE CHEST 1 VIEW COMPARISON:  Chest radiographs 12/14/2023 and earlier. FINDINGS: Portable AP semi upright view at 0950 hours. Lung volumes not significantly changed. Stable cardiac size and mediastinal contours. Calcified aortic atherosclerosis. Visualized tracheal air column is within normal limits. Increased bilateral patchy and indistinct perihilar lung opacity more apparent on the right. No pneumothorax. No pulmonary edema. No pleural effusion or consolidation identified. Underlying emphysema demonstrated by CT in 2023. Stable visualized osseous structures.  Paucity of bowel gas. IMPRESSION: Emphysema (ICD10-J43.9). Patchy new or  increased left > right perihilar opacity since 12/14/2023 suspicious for acute infectious exacerbation. No consolidation or pleural effusion evident. Electronically Signed   By: VEAR Hurst M.D.   On: 12/22/2023 09:58    EKG: My personal interpretation of EKG shows: rate controlled afib    Assessment/Plan Principal Problem:   Acute on chronic respiratory failure with hypoxia (HCC) Active Problems:   COPD with acute exacerbation (HCC)   Acute respiratory failure with hypoxia (HCC)   Hypernatremia   Essential hypertension   CKD stage 3b, GFR 30-44 ml/min (HCC) - baseline Scr 1.7-1.8   Bilateral lower extremity edema   Persistent atrial fibrillation (HCC)   Secondary hypercoagulable state   Chronic diastolic CHF (congestive heart failure) (HCC)   DNR (do not resuscitate)/DNI(Do Not Intubate)    Assessment and Plan: * Acute on chronic respiratory failure with hypoxia (HCC) 12/22/23 admit to progressive telemetry bed.  Patient had been on BiPAP initially in the ER.  He has subsequently been taken off.  He has an elevated serum bicarbonate on his chemistry.  Will check a VBG to see if he is retaining CO2.  He probably is.  Daughter states that the patient's home oxygen was never delivered last week when was ordered.  He will need a home oxygen evaluation and case management to help him secure home portable O2.   COPD with acute exacerbation (HCC) 12/22/23 was already given IV Solu-Medrol  by EMS.  Will continue with  prednisone  40 mg daily and DuoNebs every 6 hours.  CT chest pending to rule out pneumonia.  This will determine whether or not he needs to continue on IV antibiotics or not.   Hypernatremia 12/22/23 hold po lasix . Repeat BMP in AM.   Acute respiratory failure with hypoxia (HCC) 12/22/23 this was diagnosed last week when he was in the office. Daughter states that the patient's home oxygen was never delivered last week when was ordered. He will need a home oxygen evaluation and  case management to help him secure home portable O2.    DNR (do not resuscitate)/DNI(Do Not Intubate) 12/22/23 confirmed DNR/DNI status with pt's dtr Samuel Mcconnell. He will need DNR form completed for discharge.   Chronic diastolic CHF (congestive heart failure) (HCC) 12/22/23 not exacerbated. Does not looks fluid overloaded. May be a little dehydrated given Na of 149. Hold lasix  for now.   Secondary hypercoagulable state 12/22/23 due to afib. On Eliquis    Persistent atrial fibrillation (HCC) 12/22/23 chronic. Continue amiodarone  and eliquis .   Bilateral lower extremity edema 12/22/23 chronic. Dtr and grand-dtr both state that his edema currently at +1-2 bilateral lower legs/ankles/feet is better than it is normally.   CKD stage 3b, GFR 30-44 ml/min (HCC) - baseline Scr 1.7-1.8 12/22/23 Scr 1.81, BUN 41. At his baseline Scr. Hold lasix  due to hypernatremia   Essential hypertension 12/22/23 hold lasix  due to hypernatremia.   DVT prophylaxis: Eliquis  Code Status: DNR/DNI(Do NOT Intubate) Family Communication: discussed with pt's dtr Samuel Mcconnell and grand-dtr Samuel Mcconnell at bedside  Disposition Plan: return home  Consults called: none  Admission status: Inpatient, Telemetry bed   Camellia Door, DO [facility] 12/22/2023, 1:49 PM

## 2023-12-22 NOTE — ED Triage Notes (Signed)
 Patient BIB EMS from [facility], independent living.  Patient has had productive cough times 3 days with wheezes.  O2 sats were in 70's per daughter checking on him. .  EMS reports wheezes.  Ambulates with assist.  Patient not on home O2 with hx of COPD.

## 2023-12-22 NOTE — Assessment & Plan Note (Addendum)
 12/22/23 chronic. Dtr and grand-dtr both state that his edema currently at +1-2 bilateral lower legs/ankles/feet is better than it is normally.

## 2023-12-22 NOTE — ED Notes (Signed)
 Venous blood gas walked to lab.

## 2023-12-22 NOTE — Progress Notes (Signed)
 Pt placed back on BIPAP due to VBG.

## 2023-12-22 NOTE — Progress Notes (Signed)
   Pt developed low grade fever of 100.3 after being taken off bipap. He wore bipap for about 4 hours. PCO2 retention has resolved and acute respiratory acidosis has improved. Will stop continuous bipap. Bipap at bedtime.  Will start abx due to low grade fever. Hope he didn't aspirate while on bipap. Check procalcitonin in AM.  Camellia Door, DO [facility]

## 2023-12-22 NOTE — Assessment & Plan Note (Addendum)
 12/22/23 due to afib. On Eliquis 

## 2023-12-22 NOTE — Progress Notes (Signed)
   CT chest negative for pneumonia. Pt has COPD exacerbation. No pulmonary edema. He does not have acute CHF.  Camellia Door, DO [facility]

## 2023-12-22 NOTE — Progress Notes (Signed)
   VBG shows pH of 7.26 with a pCO2 of 80.  Consistent with my suspicion he has acute on chronic hypoxic and hypercapnic respiratory failure.  Patient needs to continue BiPAP.  Venous Blood Gas: Lab Results  Component Value Date/Time   PHVEN 7.26 12/22/2023 01:56 PM   PCO2VEN 80 (HH) 12/22/2023 01:56 PM   PO2VEN 33 12/22/2023 01:56 PM   HCO3 35.9 (H) 12/22/2023 01:56 PM   O2SAT 57 12/22/2023 01:56 PM     Camellia Door, DO [facility]

## 2023-12-22 NOTE — ED Notes (Signed)
 Respiratory here to help transport patient to floor

## 2023-12-22 NOTE — Assessment & Plan Note (Addendum)
 12/22/23 chronic. Continue amiodarone  and eliquis .

## 2023-12-22 NOTE — ED Notes (Signed)
 Patient resting in bed breathing eyes closed with family at bedside

## 2023-12-22 NOTE — Assessment & Plan Note (Addendum)
 12/22/23 was already given IV Solu-Medrol  by EMS.  Will continue with prednisone  40 mg daily and DuoNebs every 6 hours.  CT chest pending to rule out pneumonia.  This will determine whether or not he needs to continue on IV antibiotics or not.

## 2023-12-22 NOTE — Assessment & Plan Note (Addendum)
 12/22/23 this was diagnosed last week when he was in the office. Daughter states that the patient's home oxygen was never delivered last week when was ordered. He will need a home oxygen evaluation and case management to help him secure home portable O2.

## 2023-12-22 NOTE — ED Provider Notes (Signed)
 [place] [facility]ENC[facility] DEPARTMENT AT [facility] Provider Note   CSN: [id number] Arrival date & time: 12/22/23  9074     Patient presents with: Fever and Shortness of Breath   Samuel Mcconnell is a 89 y.o. male.   HPI     89 year old male with a history of atrial fibrillation on amiodarone  and Eliquis  2.5 mg twice daily, CHF, COPD, hypertension, CKD stage III, admission in February for acute hypoxic respiratory failure COPD exacerbation, who presents with concern for shortness of breath and hypoxia.  Reports has had a cough over the last 3 days with congestion, increasing shortness of breath.  Per EMS, his oxygen saturation was 65% on arrival.  His daughter had reported to EMS he is DNR but does not have paperwork and he also reports that if things worsen that he would rather be made comfortable if the team feels it is best.  History is limited by his degree of dyspnea. Denies chest pain.   Temperature 100.1 with EMS and given 325mg  acetaminophen  They placed him on albuterol  nebs, gave solumedrol 125mg -his O2 improved on 8L with nonrebreather.   He did have a fall and was seen by his primary care physician on September 23.  Coming from an independent living facility. Daughter reports his wife of 73 years was on BiPAP and passed away in 05/29/23.  She reports that when they were both alive they said they wanted everything but that at this point thinks wants to be with her mom who passed away in 29-May-2023, and that he wouldn't want any life saving measures if his quality of life is decreased and that his views are now to focus on being comfortable if things worsen, no intubation and DNR.     Past Medical History:  Diagnosis Date   Arthritis    Atrial fibrillation [facility])    radiofrequency ablation   Cataract    CHF (congestive heart failure) (HCC)    Clotting disorder    due to medication   Emphysema    Emphysema of lung (HCC)    Hypertension    Hypoxemia 12/14/2023   Thyroid   disease    Ulcer    GI years prior     Prior to Admission medications   Medication Sig Start Date End Date Taking? Authorizing Provider  albuterol  (VENTOLIN  HFA) 108 (90 Base) MCG/ACT inhaler Inhale 2 puffs into the lungs every 6 (six) hours as needed for wheezing or shortness of breath. 08/23/23  Yes Willo Mini, NP  ALLEGRA ALLERGY 180 MG tablet Take 180 mg by mouth at bedtime.   Yes [provider]  amiodarone  (PACERONE ) 200 MG tablet Take 1 tablet (200 mg total) by mouth daily. 03/19/23  Yes Croitoru, Mihai, MD  apixaban  (ELIQUIS ) 2.5 MG TABS tablet Take 1 tablet (2.5 mg total) by mouth 2 (two) times daily. 10/27/23  Yes Croitoru, Mihai, MD  ascorbic acid (VITAMIN C) 500 MG tablet Take 500-1,000 mg by mouth in the morning.   Yes [provider]  BREZTRI  AEROSPHERE 160-9-4.8 MCG/ACT AERO inhaler INHALE 2 PUFFS INTO THE LUNGS 2 TIMES DAILY 10/28/23  Yes Alvia Bring, DO  Cholecalciferol  (VITAMIN D3) 125 MCG (5000 UT) TABS Take 5,000 Units by mouth daily with lunch.   Yes [provider]  diclofenac Sodium (VOLTAREN) 1 % GEL Apply 2 g topically See admin instructions. Apply 2 grams to the affected areas at bedtime and an additional 2 grams up to three times a day as needed for pain  Yes [provider]  diphenhydramine -acetaminophen  (TYLENOL  PM) 25-500 MG TABS tablet Take 1 tablet by mouth at bedtime as needed (for sleeplessness- if no Tylenol  650 mg tablets have been taken).   Yes [provider]  fluticasone  (FLONASE ) 50 MCG/ACT nasal spray Place 2 sprays into both nostrils daily as needed for allergies or rhinitis. Patient taking differently: Place 2 sprays into both nostrils daily. 08/23/23 12/22/23 Yes Willo Mini, NP  furosemide  (LASIX ) 40 MG tablet Take 1 tablet (40 mg total) by mouth daily. 06/11/23 12/22/23 Yes Croitoru, Mihai, MD  ipratropium-albuterol  (DUONEB) 0.5-2.5 (3) MG/3ML SOLN Take 3 mLs by nebulization every 6 (six) hours as needed. Patient  taking differently: Take 3 mLs by nebulization See admin instructions. Nebulize 3 ml's and inhale into the lungs two times a day and an additional twice a day as needed for shortness of breath or wheezing 12/14/23  Yes Alvia Bring, DO  levothyroxine  (SYNTHROID ) 137 MCG tablet Take 1 tablet (137 mcg total) by mouth daily before breakfast. 08/23/23  Yes Willo Mini, NP  MIRALAX  17 GM/SCOOP powder Take 8.5-17 g by mouth daily as needed for mild constipation (to be mixed into 4-8 ounces of liquid).   Yes [provider]  Multiple Vitamin (MULTIVITAMIN) capsule Take 1 capsule by mouth daily with breakfast.   Yes [provider]  NON FORMULARY Take 2 tablets by mouth See admin instructions. SuperBeets Heart Chews- Chew 2 tablets by mouth once a day   Yes [provider]  potassium chloride  (KLOR-CON ) 10 MEQ tablet Take 1 tablet (10 mEq total) by mouth daily. 11/29/23 02/27/24 Yes Croitoru, Mihai, MD  rOPINIRole  (REQUIP ) 0.25 MG tablet Take 3 tablets (0.75 mg total) by mouth at bedtime. Patient taking differently: Take 0.25-0.75 mg by mouth See admin instructions. Take 0.75 mg by mouth at bedtime and an additional 0.25 mg as needed for unresolved restless legs 08/23/23 12/22/23 Yes Willo Mini, NP  traMADol  (ULTRAM ) 50 MG tablet Take 0.5-1 tablets (25-50 mg total) by mouth every 8 (eight) hours as needed for moderate pain (pain score 4-6). Patient taking differently: Take 25 mg by mouth every 8 (eight) hours as needed (for pain). 11/01/23  Yes Curtis Debby PARAS, MD  triamcinolone  ointment (KENALOG ) 0.1 % Apply 1 Application topically 2 (two) times daily as needed (irritation).   Yes [provider]  TYLENOL  8 HOUR ARTHRITIS PAIN 650 MG CR tablet Take 650 mg by mouth in the morning and at bedtime.   Yes [provider]  AMBULATORY NON FORMULARY MEDICATION Please provide toilet riser/elevator with arms.  Diagnosis: Generalized weakness R53.1 07/11/21   Alvia Bring, DO   AMBULATORY NON FORMULARY MEDICATION Please provide home oxygen concentrator.  Flow 2 LPM O2 on RA at rest-89% O2 on 2LPM O2 at rest- 94% 12/14/23   Alvia Bring, DO  fluticasone -salmeterol (ADVAIR DISKUS) 250-50 MCG/ACT AEPB Inhale 1 puff into the lungs in the morning and at bedtime. Patient not taking: Reported on 12/22/2023 11/02/23   Alvia Bring, DO  Spacer/Aero-Holding [facility] Use with inhaler. 12/14/23   Alvia Bring, DO    Allergies: Clindamycin/lincomycin, Dabigatran etexilate mesylate, and Doxycycline     Review of Systems  Updated Vital Signs BP (!) 160/83 (BP Location: Right Arm)   Pulse 77   Temp (!) 97.5 F (36.4 C) (Oral)   Resp 18   Ht 5' 11.5 (1.816 m)   Wt 79.4 kg   SpO2 94%   BMI 24.07 kg/m   Physical Exam Vitals and nursing  note reviewed.  Constitutional:      General: He is in acute distress.     Appearance: He is well-developed. He is ill-appearing and toxic-appearing. He is not diaphoretic.  HENT:     Head: Normocephalic and atraumatic.  Eyes:     Conjunctiva/sclera: Conjunctivae normal.  Cardiovascular:     Rate and Rhythm: Normal rate. Rhythm irregular.     Heart sounds: Normal heart sounds. No murmur heard.    No friction rub. No gallop.  Pulmonary:     Effort: Tachypnea and respiratory distress present.     Breath sounds: Wheezing present. No rales.     Comments: Decreased breath sounds Abdominal:     General: There is no distension.     Palpations: Abdomen is soft.     Tenderness: There is no abdominal tenderness. There is no guarding.  Musculoskeletal:     Cervical back: Normal range of motion.     Right lower leg: Edema present.     Left lower leg: Edema present.  Skin:    General: Skin is warm and dry.  Neurological:     Mental Status: He is alert and oriented to person, place, and time.     (all labs ordered are listed, but only abnormal results are displayed) Labs Reviewed  CBC WITH DIFFERENTIAL/PLATELET - Abnormal;  Notable for the following components:      Result Value   WBC 12.1 (*)    Lymphs Abs 4.7 (*)    Monocytes Absolute 1.2 (*)    Eosinophils Absolute 0.8 (*)    All other components within normal limits  COMPREHENSIVE METABOLIC PANEL WITH GFR - Abnormal; Notable for the following components:   Sodium 146 (*)    CO2 33 (*)    Glucose, Bld 129 (*)    BUN 41 (*)    Creatinine, Ser 1.81 (*)    AST 44 (*)    GFR, Estimated 34 (*)    All other components within normal limits  PRO BRAIN NATRIURETIC PEPTIDE - Abnormal; Notable for the following components:   Pro Brain Natriuretic Peptide 1,034.0 (*)    All other components within normal limits  BLOOD GAS, VENOUS - Abnormal; Notable for the following components:   pCO2, Ven 80 (*)    Bicarbonate 35.9 (*)    Acid-Base Excess 5.9 (*)    All other components within normal limits  BLOOD GAS, VENOUS - Abnormal; Notable for the following components:   pO2, Ven 51 (*)    Bicarbonate 33.3 (*)    Acid-Base Excess 6.0 (*)    All other components within normal limits  TROPONIN T, HIGH SENSITIVITY - Abnormal; Notable for the following components:   Troponin T High Sensitivity 97 (*)    All other components within normal limits  TROPONIN T, HIGH SENSITIVITY - Abnormal; Notable for the following components:   Troponin T High Sensitivity 86 (*)    All other components within normal limits  RESP PANEL BY RT-PCR (RSV, FLU A&B, COVID)  [id number]  CULTURE, BLOOD (ROUTINE X 2)  CULTURE, BLOOD (ROUTINE X 2)  BLOOD GAS, VENOUS  PROCALCITONIN  CBC WITH DIFFERENTIAL/PLATELET  COMPREHENSIVE METABOLIC PANEL WITH GFR  MAGNESIUM  I-STAT CG4 LACTIC ACID, ED  I-STAT CG4 LACTIC ACID, ED    EKG: EKG Interpretation Date/Time:  Wednesday December 22 2023 09:33:03 EDT Ventricular Rate:  90 PR Interval:  241 QRS Duration:  158 QT Interval:  398 QTC Calculation: 487 R Axis:   102  Text  Interpretation: Unknown rhythm, irregular rate -suspect atrial fibrillation  Prolonged PR interval Right bundle branch block No significant change since last tracing Confirmed by Dreama Longs [phone]) on 12/22/2023 9:43:31 AM  Radiology: CT CHEST WO CONTRAST Result Date: 12/22/2023 CLINICAL DATA:  Pneumonia, complication suspected, xray done EXAM: CT CHEST WITHOUT CONTRAST TECHNIQUE: Multidetector CT imaging of the chest was performed following the standard protocol without IV contrast. RADIATION DOSE REDUCTION: This exam was performed according to the departmental dose-optimization program which includes automated exposure control, adjustment of the mA and/or kV according to patient size and/or use of iterative reconstruction technique. COMPARISON:  CT angiography chest from 07/02/2021. FINDINGS: Cardiovascular: Normal cardiac size. No pericardial effusion. No aortic aneurysm. There are coronary artery calcifications, in keeping with coronary artery disease. There are also moderate peripheral atherosclerotic vascular calcifications of thoracic aorta and its major branches. Mediastinum/Nodes: Small/atrophic thyroid  gland, appears more atrophic in comparison the prior study. No solid / cystic mediastinal masses. The esophagus is nondistended precluding optimal assessment. There are few mildly prominent mediastinal lymph nodes, which do not meet the size criteria for lymphadenopathy and appear grossly similar to the prior study, favoring benign etiology. No axillary lymphadenopathy by size criteria. Evaluation of bilateral hila is limited due to lack on intravenous contrast: however, no large hilar lymphadenopathy identified. Lungs/Pleura: The central tracheo-bronchial tree is patent. Mild upper lobe predominant centrilobular emphysematous changes noted. Redemonstration of nodular partially calcified pleural thickening along the anterior aspect of the left upper lobe, similar to the prior study. No new mass or consolidation. No pleural effusion or pneumothorax. No suspicious lung  nodules. Upper Abdomen: There is diffuse hypoattenuation of the liver, which is nonspecific but can be seen with medication such as amiodarone . There is a single peripherally rim calcified gallstone without imaging signs of acute cholecystitis. 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 There is air in the nondilated main pancreatic duct in the head, which is nonspecific but can be seen with sphincterotomy. Remaining visualized upper abdominal viscera within normal limits. Musculoskeletal: The visualized soft tissues of the chest wall are grossly unremarkable. No suspicious osseous lesions. There are mild to moderate multilevel degenerative changes in the visualized spine. IMPRESSION: 1. No acute lung disease. No lung mass, consolidation, pleural effusion or pneumothorax. 2. Multiple other nonacute observations, as described above. Aortic Atherosclerosis (ICD10-I70.0) and Emphysema (ICD10-J43.9). Electronically Signed   By: Ree Molt M.D.   On: 12/22/2023 13:59   DG Chest Portable 1 View Result Date: 12/22/2023 CLINICAL DATA:  89 year old male with shortness of breath. EXAM: PORTABLE CHEST 1 VIEW COMPARISON:  Chest radiographs 12/14/2023 and earlier. FINDINGS: Portable AP semi upright view at 0950 hours. Lung volumes not significantly changed. Stable cardiac size and mediastinal contours. Calcified aortic atherosclerosis. Visualized tracheal air column is within normal limits. Increased bilateral patchy and indistinct perihilar lung opacity more apparent on the right. No pneumothorax. No pulmonary edema. No pleural effusion or consolidation identified. Underlying emphysema demonstrated by CT in 2023. Stable visualized osseous structures.  Paucity of bowel gas. IMPRESSION: Emphysema (ICD10-J43.9). Patchy new or increased left > right perihilar opacity since 12/14/2023 suspicious for acute infectious exacerbation. No consolidation or pleural effusion evident. Electronically Signed    By: VEAR Hurst M.D.   On: 12/22/2023 09:58     Procedures   Medications Ordered in the ED  aspirin  chewable tablet 324 mg (324 mg Oral Not Given 12/22/23 1226)  amiodarone  (PACERONE ) tablet 200 mg (200 mg Oral Given 12/22/23 1610)  levothyroxine  (SYNTHROID ) tablet 137 mcg (has no administration in time range)  apixaban  (ELIQUIS ) tablet 2.5 mg (2.5 mg Oral Given 12/22/23 1610)  rOPINIRole  (REQUIP ) tablet 0.75 mg (0.75 mg Oral Given 12/22/23 2129)  predniSONE  (DELTASONE ) tablet 40 mg (has no administration in time range)  ipratropium-albuterol  (DUONEB) 0.5-2.5 (3) MG/3ML nebulizer solution 3 mL (3 mLs Nebulization Not Given 12/22/23 2042)  acetaminophen  (TYLENOL ) tablet 1,000 mg (has no administration in time range)    Or  acetaminophen  (TYLENOL ) suppository 650 mg (has no administration in time range)  ondansetron  (ZOFRAN ) tablet 4 mg (has no administration in time range)    Or  ondansetron  (ZOFRAN ) injection 4 mg (has no administration in time range)  cefTRIAXone  (ROCEPHIN ) 1 g in sodium chloride  0.9 % 100 mL IVPB (has no administration in time range)  azithromycin  (ZITHROMAX ) tablet 500 mg (has no administration in time range)  albuterol  (PROVENTIL ) (2.5 MG/3ML) 0.083% nebulizer solution (15 mg/hr Nebulization Given 12/22/23 0952)  ipratropium (ATROVENT ) nebulizer solution 0.5 mg (0.5 mg Nebulization Given 12/22/23 1003)  magnesium sulfate IVPB 2 g 50 mL (0 g Intravenous Stopped 12/22/23 1032)  cefTRIAXone  (ROCEPHIN ) 1 g in sodium chloride  0.9 % 100 mL IVPB (0 g Intravenous Stopped 12/22/23 1110)  azithromycin  (ZITHROMAX ) 500 mg in sodium chloride  0.9 % 250 mL IVPB (0 mg Intravenous Stopped 12/22/23 1226)  haloperidol lactate (HALDOL) injection 2.5 mg (2.5 mg Intravenous Given 12/22/23 1821)                                      89 year old male with a history of atrial fibrillation on amiodarone  and Eliquis  2.5 mg twice daily, CHF, COPD, hypertension, CKD stage III, admission in February for  acute hypoxic respiratory failure COPD exacerbation, who presents with concern for shortness of breath and hypoxia.  Differential diagnosis for dyspnea includes ACS, PE, COPD exacerbation, CHF exacerbation, anemia, pneumonia, viral etiology such as COVID 19 infection, metabolic abnormality.    Clinically with cough, suspect likely pneumonia/COPD exacerbation related to viral etiology, consider CHF with leg swelling.  Lower clinical suspicion for PE.  EKG was evaluated by me which showed artifact, however suspect atrial fibrillation with known right bundle branch block, no other acute abnormalities.  .  Chest x-ray was done personally about interpreted by me which showed emphysema and new patchiness suspicious for infection.    Labs completed and personally eval and interpreted by me show a lactic acid .9.   Initially on arrival to the emergency department had just been on 8 to 10 L of oxygen and briefly maintained his saturation prior to having desaturation down to the 80s, was placed on nasal cannula followed by nonrebreather, sats improved with 15L but had continued tachypnea.  Given increase work of breathing, clinical concern for COPD, hypoxia, will place on BiPAP.  Received solumedrol, nebs with EMS, given additional albuterol /atrovent  in ED, Mg and azithromycin /rocephin  for suspected CAP.    Other labs completed and evaluated by me show mild troponin elevation, likely in setting of acute illness.  Leukocytosis 12100.  Hgb 13.4.  CMP with mild hypernatremia, Cr 1.8. pro BNP 1034.    Will admit to stepdown unit for further care.  Appears more comfortable on BiPAP at this time.        Final diagnoses:  Hypoxia  Community acquired pneumonia, unspecified laterality    ED Discharge Orders     None  Dreama Longs, MD 12/22/23 2204

## 2023-12-22 NOTE — Assessment & Plan Note (Addendum)
 12/22/23 not exacerbated. Does not looks fluid overloaded. May be a little dehydrated given Na of 149. Hold lasix  for now.

## 2023-12-22 NOTE — ED Notes (Signed)
 Patient resting in bed on Bipap. Family at bedside.

## 2023-12-22 NOTE — Progress Notes (Signed)
 PT taken off bipap per MD, tolerating well at this time. RT will continue to monitor.

## 2023-12-22 NOTE — Assessment & Plan Note (Signed)
 12/22/23 hold po lasix . Repeat BMP in AM.

## 2023-12-22 NOTE — Telephone Encounter (Signed)
 Family is requesting portable O2 to be available for patient's return home.   Pt is being moved from ER to Stepdown.

## 2023-12-22 NOTE — Assessment & Plan Note (Addendum)
 12/22/23 confirmed DNR/DNI status with pt's dtr brenda. He will need DNR form completed for discharge.

## 2023-12-22 NOTE — Assessment & Plan Note (Addendum)
 12/22/23 Scr 1.81, BUN 41. At his baseline Scr. Hold lasix  due to hypernatremia

## 2023-12-23 ENCOUNTER — Encounter (HOSPITAL_COMMUNITY): Payer: Self-pay

## 2023-12-23 ENCOUNTER — Telehealth: Payer: Self-pay | Admitting: Cardiovascular Disease

## 2023-12-23 DIAGNOSIS — J9621 Acute and chronic respiratory failure with hypoxia: Secondary | ICD-10-CM | POA: Diagnosis not present

## 2023-12-23 LAB — COMPREHENSIVE METABOLIC PANEL WITH GFR
ALT: 27 U/L (ref 0–44)
AST: 49 U/L — ABNORMAL HIGH (ref 15–41)
Albumin: 3.6 g/dL (ref 3.5–5.0)
Alkaline Phosphatase: 54 U/L (ref 38–126)
Anion gap: 12 (ref 5–15)
BUN: 35 mg/dL — ABNORMAL HIGH (ref 8–23)
CO2: 27 mmol/L (ref 22–32)
Calcium: 9.3 mg/dL (ref 8.9–10.3)
Chloride: 102 mmol/L (ref 98–111)
Creatinine, Ser: 1.46 mg/dL — ABNORMAL HIGH (ref 0.61–1.24)
GFR, Estimated: 44 mL/min — ABNORMAL LOW (ref >60)
Glucose, Bld: 130 mg/dL — ABNORMAL HIGH (ref 70–99)
Potassium: 4.8 mmol/L (ref 3.5–5.1)
Sodium: 141 mmol/L (ref 135–145)
Total Bilirubin: 0.5 mg/dL (ref 0.0–1.2)
Total Protein: 5.7 g/dL — ABNORMAL LOW (ref 6.5–8.1)

## 2023-12-23 LAB — BLOOD GAS, VENOUS
Acid-Base Excess: 9.7 mmol/L — ABNORMAL HIGH (ref 0.0–2.0)
Bicarbonate: 35.4 mmol/L — ABNORMAL HIGH (ref 20.0–28.0)
O2 Saturation: 62.8 %
Patient temperature: 36.8
pCO2, Ven: 51 mmHg (ref 44–60)
pH, Ven: 7.45 — ABNORMAL HIGH (ref 7.25–7.43)
pO2, Ven: 32 mmHg (ref 32–45)

## 2023-12-23 LAB — RESPIRATORY PANEL BY PCR

## 2023-12-23 LAB — CBC WITH DIFFERENTIAL/PLATELET
Abs Immature Granulocytes: 0.05 K/uL (ref 0.00–0.07)
Basophils Absolute: 0 K/uL (ref 0.0–0.1)
Basophils Relative: 0 %
Eosinophils Absolute: 0 K/uL (ref 0.0–0.5)
Eosinophils Relative: 0 %
HCT: 36.9 % — ABNORMAL LOW (ref 39.0–52.0)
Hemoglobin: 11.5 g/dL — ABNORMAL LOW (ref 13.0–17.0)
Immature Granulocytes: 1 %
Lymphocytes Relative: 11 %
Lymphs Abs: 1 K/uL (ref 0.7–4.0)
MCH: 29.2 pg (ref 26.0–34.0)
MCHC: 31.2 g/dL (ref 30.0–36.0)
MCV: 93.7 fL (ref 80.0–100.0)
Monocytes Absolute: 0.2 K/uL (ref 0.1–1.0)
Monocytes Relative: 2 %
Neutro Abs: 7.6 K/uL (ref 1.7–7.7)
Neutrophils Relative %: 86 %
Platelets: 254 K/uL (ref 150–400)
RBC: 3.94 MIL/uL — ABNORMAL LOW (ref 4.22–5.81)
RDW: 14.6 % (ref 11.5–15.5)
WBC: 8.8 K/uL (ref 4.0–10.5)
nRBC: 0 % (ref 0.0–0.2)

## 2023-12-23 LAB — PROCALCITONIN: Procalcitonin: 0.16 ng/mL

## 2023-12-23 LAB — MAGNESIUM: Magnesium: 2.4 mg/dL (ref 1.7–2.4)

## 2023-12-23 LAB — PHOSPHORUS: Phosphorus: 2.4 mg/dL — ABNORMAL LOW (ref 2.5–4.6)

## 2023-12-23 MED ORDER — LOPERAMIDE HCL 2 MG PO CAPS
2.0000 mg | ORAL_CAPSULE | ORAL | Status: DC | PRN
  Administered 2023-12-23: 2 mg via ORAL
  Filled 2023-12-23 (×3): qty 1

## 2023-12-23 MED ORDER — FUROSEMIDE 10 MG/ML IJ SOLN
20.0000 mg | Freq: Every day | INTRAMUSCULAR | Status: DC
  Administered 2023-12-23: 20 mg via INTRAVENOUS
  Filled 2023-12-23: qty 2

## 2023-12-23 MED ORDER — ROPINIROLE HCL 0.5 MG PO TABS: 0.7500 mg | ORAL_TABLET | Freq: Every day | ORAL | Status: DC

## 2023-12-23 MED ORDER — LORATADINE 10 MG PO TABS
10.0000 mg | ORAL_TABLET | Freq: Every day | ORAL | Status: DC
  Administered 2023-12-23 – 2023-12-28 (×5): 10 mg via ORAL
  Filled 2023-12-23 (×6): qty 1

## 2023-12-23 MED ORDER — GUAIFENESIN ER 600 MG PO TB12
600.0000 mg | ORAL_TABLET | Freq: Two times a day (BID) | ORAL | Status: DC
Start: 2023-12-23 — End: 2023-12-28
  Administered 2023-12-23 – 2023-12-28 (×10): 600 mg via ORAL
  Filled 2023-12-23 (×11): qty 1

## 2023-12-23 MED ORDER — BUDESON-GLYCOPYRROL-FORMOTEROL 160-9-4.8 MCG/ACT IN AERO
2.0000 | INHALATION_SPRAY | Freq: Two times a day (BID) | RESPIRATORY_TRACT | Status: DC
  Administered 2023-12-23 – 2023-12-28 (×7): 2 via RESPIRATORY_TRACT
  Filled 2023-12-23: qty 5.9

## 2023-12-23 MED ORDER — QUETIAPINE FUMARATE 25 MG PO TABS
25.0000 mg | ORAL_TABLET | Freq: Every day | ORAL | Status: DC
  Administered 2023-12-23: 25 mg via ORAL
  Filled 2023-12-23: qty 1

## 2023-12-23 MED ORDER — ROPINIROLE HCL 0.25 MG PO TABS
0.2500 mg | ORAL_TABLET | Freq: Every day | ORAL | Status: DC | PRN
  Filled 2023-12-23: qty 1

## 2023-12-23 MED ORDER — GUAIFENESIN-DM 100-10 MG/5ML PO SYRP
5.0000 mL | ORAL_SOLUTION | ORAL | Status: DC | PRN
  Filled 2023-12-23: qty 10

## 2023-12-23 MED ORDER — FLUTICASONE PROPIONATE 50 MCG/ACT NA SUSP
2.0000 | Freq: Every day | NASAL | Status: DC
  Administered 2023-12-25 – 2023-12-28 (×4): 2 via NASAL
  Filled 2023-12-23: qty 16

## 2023-12-23 MED ORDER — IPRATROPIUM-ALBUTEROL 0.5-2.5 (3) MG/3ML IN SOLN: 3.0000 mL | RESPIRATORY_TRACT | Status: DC | PRN

## 2023-12-23 MED ORDER — K PHOS MONO-SOD PHOS DI & MONO 155-852-130 MG PO TABS
500.0000 mg | ORAL_TABLET | Freq: Two times a day (BID) | ORAL | Status: AC
  Administered 2023-12-23 (×2): 500 mg via ORAL
  Filled 2023-12-23 (×2): qty 2

## 2023-12-23 NOTE — Telephone Encounter (Signed)
 Daughter called to say patient is in the hospital and would like for Dr. Francyne to take a look at her chart Please advise

## 2023-12-23 NOTE — Progress Notes (Signed)
 [facility] Progress Note  Patient: Samuel Mcconnell    FMW:[id number]  DOA: 12/22/2023     Date of Service: the patient was seen and examined on 12/23/2023  Chief Complaint  Patient presents with   Fever   Shortness of Breath   Brief hospital course:  89 year old Caucasian male with a history of COPD, diastolic heart failure, A-fib on Eliquis , recent need for home oxygen, CKD stage IIIb/IV, who presents to the ER today via EMS due to cough, shortness of breath and hypoxia.  Daughter states that he was seen last week by his PCP and started on home oxygen due to hypoxia noted in the ER.  His home oxygen was never delivered.  He has been having progressive shortness of breath over the last 2 to 3 days.  He lives at [facility] in independent living.   Daughter states that his independent living has been doing renovations to the building including tearing up old carpet that has mold.   When EMS arrived, he was in obvious shortness of breath.  Family stated that he was hypoxic with room air saturations in the 70s.  He was placed on supplemental oxygen.  He was given a DuoNeb and given IV Solu-Medrol .   When he arrived in the ER, he was in obvious respiratory extremis and was placed immediately on BiPAP.   Temp 98.9 heart rate 86, blood pressure 172/86.  BiPAP at 28% FiO2.    Admission Labs: White count 12.1, hemoglobin 13.4, platelet 282 proBNP of 1034 Sodium 146, potassium 4.6, bicarb 33, BUN 41, creatinine 1.8, glucose 129 Total protein 6.9, albumin 4.4, AST 44, ALT of 17, alk phos of 65, total bili 0.4 COVID-negative, RSV negative, influenza negative Lactic acid of 1.6 Initial troponin of 97, second troponin of 86   Chest x-ray demonstrated emphysema with new perihilar opacities left greater than right since December 14, 2023.   Significant Events: Admitted 12/22/2023 acute on chronic respiratory failure. 12-22-2023 pt made DNR/DNI by EDP after discussion with pt's dtr Samuel Mcconnell  at bedside.  Assessment and Plan:  # Acute on chronic respiratory failure with hypoxia (HCC) Continue telemetry S/p BiPAP, ABG improved Use BiPAP as needed Continue DuoNeb as needed Continue supplemental O2 in addition encouraged to wean off Patient may need home O2. Patient may need to be qualified for home oxygen and oxygen needs to be arranged by TOC.   COPD with acute exacerbation (HCC) 10/1 s/p IV Solu-Medrol  by EMS.  continue prednisone  40 mg daily and DuoNebs every 6 hours.  10/1 low-grade fever 100.3  CT chest: No acute finding Continue empiric antibiotics      Hypernatremia, mild.  Resolved 12/22/23 hold po lasix . Repeat BMP in AM. 10/2  started Lasix  20 mg IV daily, reassess hydration status tomorrow a.m.     Acute respiratory failure with hypoxia (HCC) 12/22/23 this was diagnosed last week when he was in the office. Daughter states that the patient's home oxygen was never delivered last week when was ordered. He will need a home oxygen evaluation and case management to help him secure home portable O2.  RVP panel negative   DNR (do not resuscitate)/DNI(Do Not Intubate) 12/22/23 confirmed DNR/DNI status with pt's dtr Samuel Mcconnell. He will need DNR form completed for discharge.     Chronic diastolic CHF (congestive heart failure) (HCC) 12/22/23 not exacerbated. Does not looks fluid overloaded. May be a little dehydrated given Na of 149. Hold lasix  for now.     Secondary hypercoagulable state 12/22/23  due to afib. On Eliquis      Persistent atrial fibrillation (HCC) 12/22/23 chronic. Continue amiodarone  and eliquis .     Bilateral lower extremity edema 12/22/23 chronic. Dtr and grand-dtr both state that his edema currently at +1-2 bilateral lower legs/ankles/feet is better than it is normally.     CKD stage 3b, GFR 30-44 ml/min (HCC) - baseline Scr 1.7-1.8 12/22/23 Scr 1.81, BUN 41. At his baseline Scr. Hold lasix  due to hypernatremia sCr 1.46     Hypophosphatemia, possible due to nutritional deficiency. Phos repleted Monitor and replete as needed   Essential hypertension 12/22/23 hold lasix  due to hypernatremia.   RLS: Requip  home dose  Dementia and sundowning Hospital delirium Started Seroquel nightly   Body mass index is 24.07 kg/m.  Interventions:  Diet: Heart healthy diet DVT Prophylaxis: Eliquis   Advance goals of care discussion: DNR-limited  Family Communication: family was present at bedside, at the time of interview.  The pt provided permission to discuss medical plan with the family. Opportunity was given to ask question and all questions were answered satisfactorily.   Disposition:  Pt is from home, admitted with acute respiratory failure, still has respiratory distress and on supplemental O2 and elation, which precludes a safe discharge. Discharge to home, when stable, may need few days to improve.  Subjective: No significant events overnight.  Patient is still having some shortness of breath, feels improvement after BiPAP.  Denied any active issues.  Patient did have restless leg syndrome and sundowning last night as per family. Family is requesting to restart his home medications which will be restarted. Patient denies any chest pain or palpitation, no any other complaints.  Physical Exam: General: NAD, lying comfortably Appear in no distress, affect appropriate Eyes: PERRLA ENT: Oral Mucosa Clear, moist  Neck: no JVD,  Cardiovascular: S1 and S2 Present, no Murmur,  Respiratory: good respiratory effort, Bilateral Air entry equal and Decreased, bilateral crackles, no significant wheezes  Abdomen: Bowel Sound present, Soft and no tenderness,  Skin: no rashes Extremities: no Pedal edema, no calf tenderness Neurologic: without any new focal findings Gait not checked due to patient safety concerns  Vitals:   12/23/23 0848 12/23/23 1006 12/23/23 1407 12/23/23 1422  BP:  102/69 137/81   Pulse:  77 73    Resp:      Temp:  98.4 F (36.9 C) 98 F (36.7 C)   TempSrc:  Oral Oral   SpO2: 95% 95% 94% 94%  Weight:      Height:       No intake or output data in the 24 hours ending 12/23/23 1643 Filed Weights   12/22/23 2030  Weight: 79.4 kg    Data Reviewed: I have personally reviewed and interpreted daily labs, tele strips, imagings as discussed above. I reviewed all nursing notes, pharmacy notes, vitals, pertinent old records I have discussed plan of care as described above with RN and patient/family.  CBC: Recent Labs  Lab 12/22/23 0941 12/23/23 0353  WBC 12.1* 8.8  NEUTROABS 5.3 7.6  HGB 13.4 11.5*  HCT 43.6 36.9*  MCV 95.8 93.7  PLT 282 254   Basic Metabolic Panel: Recent Labs  Lab 12/22/23 0941 12/23/23 0353  NA 146* 141  K 4.6 4.8  CL 103 102  CO2 33* 27  GLUCOSE 129* 130*  BUN 41* 35*  CREATININE 1.81* 1.46*  CALCIUM 9.7 9.3  MG  --  2.4  PHOS  --  2.4*    Studies: No results found.  Scheduled  Meds:  amiodarone   200 mg Oral Daily   apixaban   2.5 mg Oral BID   aspirin   324 mg Oral Once   azithromycin   500 mg Oral Daily   guaiFENesin   600 mg Oral BID   Influenza vac split trivalent PF  0.5 mL Intramuscular Tomorrow-1000   ipratropium-albuterol   3 mL Nebulization Q6H   levothyroxine   137 mcg Oral QAC breakfast   phosphorus  500 mg Oral BID   predniSONE   40 mg Oral Q breakfast   rOPINIRole   0.75 mg Oral QHS   Continuous Infusions:  cefTRIAXone  (ROCEPHIN )  IV 1 g (12/23/23 1004)   PRN Meds: acetaminophen  **OR** acetaminophen , guaiFENesin -dextromethorphan, ipratropium-albuterol , ondansetron  **OR** ondansetron  (ZOFRAN ) IV, rOPINIRole   Time spent: 55 minutes  Author: ELVAN SOR. MD [facility] 12/23/2023 4:43 PM  To reach On-call, see care teams to locate the attending and reach out to them via [email or web address]. If 7PM-7AM, please contact night-coverage If you still have difficulty reaching the attending provider, please page the [facility]  (Director on Call) for [facility] on amion for assistance.

## 2023-12-23 NOTE — Progress Notes (Signed)
   12/23/23 0211  BiPAP/CPAP/SIPAP  $ Non-Invasive Home Ventilator  Initial  $ Face Mask Large  Yes  BiPAP/CPAP/SIPAP Pt Type Adult  BiPAP/CPAP/SIPAP Resmed  Mask Type Full face mask  Dentures removed? Yes - Placed in denture cup  Mask Size Large  IPAP 8 cmH20  EPAP 5 cmH2O  Pressure Support 3 cmH20  Flow Rate 3 lpm  Patient Home Machine No  Patient Home Mask No  Patient Home Tubing No  Auto Titrate No  Device Plugged into RED Power Outlet Yes  BiPAP/CPAP /SiPAP Vitals  Resp 17  MEWS Score/Color  MEWS Score 0  MEWS Score Color Green

## 2023-12-23 NOTE — TOC Initial Note (Signed)
 Transition of Care [facility]) - Initial/Assessment Note   Patient Details  Name: Samuel Mcconnell MRN: [id number] Date of Birth: [birth year]  [facility]) CM/SW Contact:    Duwaine GORMAN Aran, LCSW Phone Number: 12/23/2023, 1:59 PM  Clinical Narrative: Care management consulted for HH/DME needs. CSW confirmed with [place] at [facility] that patient resides in independent living. PT consulted. Care management awaiting recommendations.  Expected Discharge Plan: Home/Self Care Barriers to Discharge: Continued Medical Work up  Expected Discharge Plan and Services In-house Referral: Clinical Social Work Living arrangements for the past 2 months: Independent Living Facility  Prior Living Arrangements/Services Living arrangements for the past 2 months: Independent Living Facility Lives with:: Facility Resident Patient language and need for interpreter reviewed:: Yes Do you feel safe going back to the place where you live?: Yes      Need for Family Participation in Patient Care: Yes (Comment) Care giver support system in place?: Yes (comment) Criminal Activity/Legal Involvement Pertinent to Current Situation/Hospitalization: No - Comment as needed  Activities of Daily Living ADL Screening (condition at time of admission) Independently performs ADLs?: No Does the patient have a NEW difficulty with bathing/dressing/toileting/self-feeding that is expected to last >3 days?: No Does the patient have a NEW difficulty with getting in/out of bed, walking, or climbing stairs that is expected to last >3 days?: Yes (Initiates electronic notice to provider for possible PT consult) Does the patient have a NEW difficulty with communication that is expected to last >3 days?: No Is the patient deaf or have difficulty hearing?: Yes Does the patient have difficulty seeing, even when wearing glasses/contacts?: No Does the patient have difficulty concentrating, remembering, or making decisions?:  Yes  Emotional Assessment Orientation: : Oriented to Self Alcohol / Substance Use: Not Applicable Psych Involvement: No (comment)  Admission diagnosis:  Acute on chronic respiratory failure with hypoxia (HCC) [J96.21] Patient Active Problem List   Diagnosis Date Noted   Acute on chronic respiratory failure with hypoxia (HCC) 12/22/2023   DNR (do not resuscitate)/DNI(Do Not Intubate) 12/22/2023   COPD with acute exacerbation (HCC) 12/22/2023   Hypernatremia 12/22/2023   Corn of foot 09/23/2023   Primary osteoarthritis of both knees 08/13/2023   Left elbow pain 07/19/2023   Acute respiratory failure with hypoxia (HCC) 05/09/2023   Rib pain on left side 10/22/2022   Thyroid  nodule 09/09/2022   Chronic diastolic CHF (congestive heart failure) (HCC) 10/25/2021   Dry nares 02/18/2021   Restless legs 11/18/2020   Secondary hypercoagulable state 10/14/2020   Gait instability 11/26/2019   Hypothyroid 11/26/2019   Bilateral lower extremity edema 09/08/2019   Chronic obstructive pulmonary disease (HCC) 09/14/2017   Essential hypertension 09/14/2017   Myringotomy tube status 02/25/2017   LVH (left ventricular hypertrophy) 03/28/2016   Gallbladder calculus without cholecystitis 07/22/2014   Fatty liver 07/22/2014   Persistent atrial fibrillation (HCC) 03/15/2013   CKD stage 3b, GFR 30-44 ml/min (HCC) - baseline Scr 1.7-1.8 02/21/2013   Allergic rhinitis 06/24/2012   PCP:  Alvia Bring, DO Pharmacy:   [facility] - [place], [place] - [address] [place] [address] Phone: [phone] Fax: [phone]  Social Drivers of Health (SDOH) Social History: SDOH Screenings   Food Insecurity: No Food Insecurity (12/22/2023)  Housing: Low Risk  (12/22/2023)  Transportation Needs: No Transportation Needs (12/22/2023)  Utilities: Not At Risk (12/22/2023)  Alcohol Screen: Low Risk  (11/30/2023)  Depression (PHQ2-9): Low Risk  (11/30/2023)   Financial Resource  Strain: Low Risk  (11/30/2023)  Physical Activity: Insufficiently Active (11/30/2023)  Social Connections: Moderately Integrated (12/22/2023)  Stress: No Stress Concern Present (11/30/2023)  Tobacco Use: Medium Risk (12/23/2023)  Health Literacy: Adequate Health Literacy (11/30/2023)   SDOH Interventions:    Readmission Risk Interventions    12/23/2023    1:56 PM 05/11/2023   10:47 AM  Readmission Risk Prevention Plan  Post Dischage Appt  Complete  Medication Screening  Complete  Transportation Screening Complete Complete  HRI or Home Care Consult Complete   Social Work Consult for Recovery Care Planning/Counseling Complete   Palliative Care Screening Not Applicable   Medication Review Oceanographer) Complete

## 2023-12-24 ENCOUNTER — Inpatient Hospital Stay (HOSPITAL_COMMUNITY)

## 2023-12-24 DIAGNOSIS — J9621 Acute and chronic respiratory failure with hypoxia: Secondary | ICD-10-CM | POA: Diagnosis not present

## 2023-12-24 LAB — CBC
HCT: 40.8 % (ref 39.0–52.0)
Hemoglobin: 12.7 g/dL — ABNORMAL LOW (ref 13.0–17.0)
MCH: 29.1 pg (ref 26.0–34.0)
MCHC: 31.1 g/dL (ref 30.0–36.0)
MCV: 93.6 fL (ref 80.0–100.0)
Platelets: 242 K/uL (ref 150–400)
RBC: 4.36 MIL/uL (ref 4.22–5.81)
RDW: 14.6 % (ref 11.5–15.5)
WBC: 15.2 K/uL — ABNORMAL HIGH (ref 4.0–10.5)
nRBC: 0 % (ref 0.0–0.2)

## 2023-12-24 LAB — IRON AND TIBC
Iron: 17 ug/dL — ABNORMAL LOW (ref 45–182)
Saturation Ratios: 5 % — ABNORMAL LOW (ref 17.9–39.5)
TIBC: 337 ug/dL (ref 250–450)
UIBC: 320 ug/dL

## 2023-12-24 LAB — BASIC METABOLIC PANEL WITH GFR
Anion gap: 12 (ref 5–15)
BUN: 40 mg/dL — ABNORMAL HIGH (ref 8–23)
CO2: 32 mmol/L (ref 22–32)
Calcium: 9 mg/dL (ref 8.9–10.3)
Chloride: 102 mmol/L (ref 98–111)
Creatinine, Ser: 1.45 mg/dL — ABNORMAL HIGH (ref 0.61–1.24)
GFR, Estimated: 45 mL/min — ABNORMAL LOW (ref >60)
Glucose, Bld: 105 mg/dL — ABNORMAL HIGH (ref 70–99)
Potassium: 4.3 mmol/L (ref 3.5–5.1)
Sodium: 145 mmol/L (ref 135–145)

## 2023-12-24 LAB — BLOOD GAS, VENOUS
Acid-Base Excess: 10.2 mmol/L — ABNORMAL HIGH (ref 0.0–2.0)
Bicarbonate: 37.9 mmol/L — ABNORMAL HIGH (ref 20.0–28.0)
O2 Saturation: 82.6 %
Patient temperature: 36.8
pCO2, Ven: 63 mmHg — ABNORMAL HIGH (ref 44–60)
pH, Ven: 7.38 (ref 7.25–7.43)
pO2, Ven: 48 mmHg — ABNORMAL HIGH (ref 32–45)

## 2023-12-24 LAB — MAGNESIUM: Magnesium: 2.3 mg/dL (ref 1.7–2.4)

## 2023-12-24 LAB — FOLATE: Folate: >20 ng/mL (ref >5.9)

## 2023-12-24 LAB — PHOSPHORUS: Phosphorus: 3.3 mg/dL (ref 2.5–4.6)

## 2023-12-24 LAB — VITAMIN D 25 HYDROXY (VIT D DEFICIENCY, FRACTURES): Vit D, 25-Hydroxy: 86.07 ng/mL (ref 30–100)

## 2023-12-24 LAB — VITAMIN B12: Vitamin B-12: 667 pg/mL (ref 180–914)

## 2023-12-24 MED ORDER — IRON SUCROSE 300 MG IVPB - SIMPLE MED
300.0000 mg | Status: AC
  Administered 2023-12-24 – 2023-12-26 (×3): 300 mg via INTRAVENOUS
  Filled 2023-12-24 (×3): qty 300

## 2023-12-24 MED ORDER — FUROSEMIDE 10 MG/ML IJ SOLN
20.0000 mg | Freq: Two times a day (BID) | INTRAMUSCULAR | Status: DC
Start: 2023-12-24 — End: 2023-12-24

## 2023-12-24 MED ORDER — QUETIAPINE FUMARATE 50 MG PO TABS
50.0000 mg | ORAL_TABLET | Freq: Every day | ORAL | Status: DC
  Administered 2023-12-24: 50 mg via ORAL
  Filled 2023-12-24: qty 1

## 2023-12-24 MED ORDER — SODIUM CHLORIDE 0.9 % IV SOLN
100.0000 mg | Freq: Two times a day (BID) | INTRAVENOUS | Status: DC
  Administered 2023-12-24 – 2023-12-27 (×7): 100 mg via INTRAVENOUS
  Filled 2023-12-24 (×8): qty 100

## 2023-12-24 MED ORDER — HALOPERIDOL LACTATE 5 MG/ML IJ SOLN
2.5000 mg | Freq: Once | INTRAMUSCULAR | Status: AC
  Administered 2023-12-24: 2.5 mg via INTRAVENOUS
  Filled 2023-12-24: qty 1

## 2023-12-24 MED ORDER — FUROSEMIDE 10 MG/ML IJ SOLN
20.0000 mg | Freq: Two times a day (BID) | INTRAMUSCULAR | Status: AC
  Administered 2023-12-24 – 2023-12-27 (×5): 20 mg via INTRAVENOUS
  Filled 2023-12-24 (×5): qty 2

## 2023-12-24 MED ORDER — ENOXAPARIN SODIUM 80 MG/0.8ML IJ SOSY
1.0000 mg/kg | PREFILLED_SYRINGE | Freq: Two times a day (BID) | INTRAMUSCULAR | Status: DC
  Filled 2023-12-24 (×2): qty 0.8

## 2023-12-24 MED ORDER — METHYLPREDNISOLONE SODIUM SUCC 40 MG IJ SOLR
40.0000 mg | Freq: Once | INTRAMUSCULAR | Status: AC
  Administered 2023-12-24: 40 mg via INTRAVENOUS
  Filled 2023-12-24: qty 1

## 2023-12-24 MED ORDER — FUROSEMIDE 10 MG/ML IJ SOLN
20.0000 mg | Freq: Every day | INTRAMUSCULAR | Status: DC
  Administered 2023-12-24: 20 mg via INTRAVENOUS
  Filled 2023-12-24: qty 2

## 2023-12-24 MED ORDER — QUETIAPINE FUMARATE 25 MG PO TABS
25.0000 mg | ORAL_TABLET | Freq: Every day | ORAL | Status: DC
  Filled 2023-12-24: qty 1

## 2023-12-24 MED ORDER — HALOPERIDOL LACTATE 5 MG/ML IJ SOLN
2.0000 mg | Freq: Four times a day (QID) | INTRAMUSCULAR | Status: DC | PRN
  Filled 2023-12-24: qty 1

## 2023-12-24 MED ORDER — APIXABAN 2.5 MG PO TABS
2.5000 mg | ORAL_TABLET | Freq: Two times a day (BID) | ORAL | Status: DC
  Administered 2023-12-24 – 2023-12-28 (×9): 2.5 mg via ORAL
  Filled 2023-12-24 (×9): qty 1

## 2023-12-24 MED ORDER — METHYLPREDNISOLONE SODIUM SUCC 40 MG IJ SOLR
20.0000 mg | Freq: Every day | INTRAMUSCULAR | Status: AC
  Administered 2023-12-24 – 2023-12-27 (×4): 20 mg via INTRAVENOUS
  Filled 2023-12-24 (×4): qty 1

## 2023-12-24 MED ORDER — FUROSEMIDE 10 MG/ML IJ SOLN: 40.0000 mg | Freq: Every day | INTRAMUSCULAR | Status: DC

## 2023-12-24 MED ORDER — FUROSEMIDE 10 MG/ML IJ SOLN
20.0000 mg | Freq: Once | INTRAMUSCULAR | Status: DC
Start: 2023-12-24 — End: 2023-12-24

## 2023-12-24 NOTE — Progress Notes (Addendum)
 Family (daughter, granddaughter) at bedside called out for help because pt became combative, agitated and restless. This RN reached out to on-call NP Lavanda Horns. Received order for IV Haldol ( see orders) that was administered to pt as directed. Family assisted with redirecting pt. Education given to pt's family regarding medications administered, safety measures. Pt now resting after intervention.    Pt's O2 desats to low 80s while wearing on 4 L [place]. On call NP and RT notified after placing pt on 10 L NRB, at 94 % 02 sats. RT placed pt on 4L HFNC, saturations at 98%. New orders placed by NP ( see orders). Medications given as ordered. Pt resting comfortably at this time

## 2023-12-24 NOTE — Progress Notes (Signed)
 Pt sleeping, will administer MDI once pt wakes up.

## 2023-12-24 NOTE — Progress Notes (Signed)
     Patient Name: Samuel Mcconnell           DOB: 1928-03-09  MRN: [id number]      Admission Date: 12/22/2023  Attending Provider: Von Bellis, MD  Primary Diagnosis: Acute on chronic respiratory failure with hypoxia (HCC)   Level of care: Progressive   OVERNIGHT EVENT  Notified by RN that patient woke up confused and agitated.  He pulled off medical equipment including O2.  Oxygen level dropped to 84%, but recovered quickly once O2 was reapplied.  Neb treatment given. Patient with history of delirium and sundowning.   Plan:  Orders placed for IV Haldol and VBG.   Addendum: 9389- Notified by RN that patient desatted again to mid 80s, while wearing 4 L [place]. Placed on 10 L NRB momentarily.   At bedside, patient is restless, but does not appear to be in acute distress.   Breath sounds are diminished, bilateral crackles heard.  Nonlabored.  No abdominal retraction. RT at bedside and is able to titrate down to 4L HFNC.    Plan:  VBG is being drawn now. Provide neb treatment, 40 mg IV Solu-Medrol , 20 mg IV Lasix .   Follow-up with chest x-ray this morning.   Darlisha Kelm, DNP, ACNPC- AG [facility] [place]

## 2023-12-24 NOTE — Progress Notes (Signed)
   12/24/23 2006  BiPAP/CPAP/SIPAP  $ Non-Invasive Home Ventilator  Subsequent  BiPAP/CPAP/SIPAP Pt Type Adult  BiPAP/CPAP/SIPAP Resmed  Reason BIPAP/CPAP not in use Other(comment) (standby. Pt unable to tolerate. Family at bedside, pt does not use at home)

## 2023-12-24 NOTE — Evaluation (Signed)
 Physical Therapy Evaluation Patient Details Name: Samuel Mcconnell MRN: [id number] DOB: [birth year] Today's Date: 12/24/2023  History of Present Illness  89 year old Caucasian, who presented to the ER 12/22/23  via EMS due to cough, shortness of breath and hypoxia.  Daughter states that he was seen last week by his PCP and started on home oxygen due to hypoxia noted in the ER.  His home oxygen was never delivered.  He has been having progressive shortness of breath over the last 2 to 3 days. Dx of Acute on chronic respiratory failure with hypoxia. Pt with a history of COPD, diastolic heart failure, A-fib on Eliquis , recent need for home oxygen, CKD stage IIIb/IV  Clinical Impression  Pt admitted with above diagnosis. Pt sleeping soundly, he did not arouse to multiple stimuli including verbal, tactile, PROM of BUEs, cold cloth to forehead, sternal rub. Pt's daughter and granddaughter present. They stated pt ambulates independently with a rollator at baseline at his ILF, he's had 3-4 falls in the past 6 months. He is independent with bathing/dressing and doing laundry. Family reports pt has been agitated when he's been awake. Unable to assess mobility as pt would not arouse. Will follow.  Pt currently with functional limitations due to the deficits listed below (see PT Problem List). Pt will benefit from acute skilled PT to increase their independence and safety with mobility to allow discharge.           If plan is discharge home, recommend the following: Two people to help with walking and/or transfers;Two people to help with bathing/dressing/bathroom;Assistance with cooking/housework;Direct supervision/assist for medications management;Direct supervision/assist for financial management;Assist for transportation;Help with stairs or ramp for entrance   Can travel by private vehicle   No    Equipment Recommendations None recommended by PT  Recommendations for Other Services       Functional Status  Assessment Patient has had a recent decline in their functional status and demonstrates the ability to make significant improvements in function in a reasonable and predictable amount of time.     Precautions / Restrictions Precautions Precautions: Fall Recall of Precautions/Restrictions: Impaired Precaution/Restrictions Comments: 3-4 falls in past 6 months Restrictions Weight Bearing Restrictions Per Provider Order: No      Mobility  Bed Mobility               General bed mobility comments: NT-pt did not arouse to verbal/tactile stimuli    Transfers                        Ambulation/Gait                  Stairs            Wheelchair Mobility     Tilt Bed    Modified Rankin (Stroke Patients Only)       Balance                                             Pertinent Vitals/Pain Pain Assessment Pain Assessment: PAINAD Breathing: normal Negative Vocalization: none Facial Expression: smiling or inexpressive Body Language: relaxed Consolability: no need to console PAINAD Score: 0    Home Living Family/patient expects to be discharged to:: Private residence   Available Help at Discharge: Family;Available PRN/intermittently Type of Home: Independent living facility Home Access: Level entry  Home Layout: One level Home Equipment: Rollator (4 wheels);Shower seat - built in;Grab bars - tub/shower Additional Comments: ILF at [facility]    Prior Function Prior Level of Function : Independent/Modified Independent             Mobility Comments: uses rollator  for mobility, 3-4 falls in past 6 months, could walk to dining room ADLs Comments: independent with laundry, bathing, dressing; daughter manages meds and gets groceries     Extremity/Trunk Assessment        Lower Extremity Assessment Lower Extremity Assessment: Difficult to assess due to impaired cognition (pt lethargic, did not arouse to  verbal/tactile stimulii)       Communication   Communication Factors Affecting Communication: Hearing impaired    Cognition Arousal: Lethargic                             PT - Cognition Comments: Pt did not arouse to multiple attempts to awaken him, Attempted verbal and tactile stimuli as well as sternal rub, Pt flinched with sternal rub but did not open eyes. Pt did not respond to commands. Family reports pt has been agitated when he's awake. Following commands: Impaired       Cueing       General Comments      Exercises General Exercises - Upper Extremity Shoulder Flexion: PROM, Both, 10 reps, Supine   Assessment/Plan    PT Assessment Patient needs continued PT services  PT Problem List Decreased activity tolerance;Decreased mobility;Cardiopulmonary status limiting activity       PT Treatment Interventions Gait training;Therapeutic exercise;Functional mobility training;Patient/family education    PT Goals (Current goals can be found in the Care Plan section)  Acute Rehab PT Goals Patient Stated Goal: return to walking with rollator PT Goal Formulation: With family Time For Goal Achievement: 01/07/24 Potential to Achieve Goals: Fair    Frequency Min 2X/week     Co-evaluation               AM-PAC PT 6 Clicks Mobility  Outcome Measure Help needed turning from your back to your side while in a flat bed without using bedrails?: Total Help needed moving from lying on your back to sitting on the side of a flat bed without using bedrails?: Total Help needed moving to and from a bed to a chair (including a wheelchair)?: Total Help needed standing up from a chair using your arms (e.g., wheelchair or bedside chair)?: Total Help needed to walk in hospital room?: Total Help needed climbing 3-5 steps with a railing? : Total 6 Click Score: 6    End of Session   Activity Tolerance: Patient limited by lethargy Patient left: in bed;with bed alarm  set;with family/visitor present Nurse Communication: Mobility status PT Visit Diagnosis: Difficulty in walking, not elsewhere classified (R26.2);History of falling (Z91.81)    Time: 9067-9051 PT Time Calculation (min) (ACUTE ONLY): 16 min   Charges:   PT Evaluation $PT Eval Low Complexity: 1 Low   PT General Charges $$ ACUTE PT VISIT: 1 Visit        Sylvan Delon Copp PT 12/24/2023  Acute Rehabilitation Services  Office [phone]

## 2023-12-24 NOTE — Progress Notes (Signed)
 Called to pt bedside because pt was desating.  Upon arrival to pt bedside, found pt on 10L NRB.  His sats were 99%.  Placed pt on HFNC @7L  and pt maintained his sats.  Started to decrease O2 and ended at 4L on the HFNC and pt sats was 98%.  Notified nurse and NP about pt current settings.  Will continue to monitor and treat.

## 2023-12-24 NOTE — Plan of Care (Signed)

## 2023-12-24 NOTE — TOC Progression Note (Signed)
 Transition of Care [facility]) - Progression Note   Patient Details  Name: Samuel Mcconnell MRN: [id number] Date of Birth: [birth year]  Transition of Care [facility]) CM/SW Contact  Duwaine GORMAN Aran, LCSW Phone Number: 12/24/2023, 11:39 AM  Clinical Narrative: PT evaluation recommended SNF. CSW met with patient's daughter, [name], and granddaughter to discuss recommendations. Patient is currently in mitts. Daughter reported the patient's current state is new. Patient appeared agitated in the bed, but with his eyes closed. Daughter expressed concerns about patient being able to go to rehab given his current condition. Care management to follow.  Expected Discharge Plan: Home/Self Care Barriers to Discharge: Continued Medical Work up  Expected Discharge Plan and Services In-house Referral: Clinical Social Work Living arrangements for the past 2 months: Independent Press photographer  Social Drivers of Health (SDOH) Interventions SDOH Screenings   Food Insecurity: No Food Insecurity (12/22/2023)  Housing: Low Risk  (12/22/2023)  Transportation Needs: No Transportation Needs (12/22/2023)  Utilities: Not At Risk (12/22/2023)  Alcohol Screen: Low Risk  (11/30/2023)  Depression (PHQ2-9): Low Risk  (11/30/2023)  Financial Resource Strain: Low Risk  (11/30/2023)  Physical Activity: Insufficiently Active (11/30/2023)  Social Connections: Moderately Integrated (12/22/2023)  Stress: No Stress Concern Present (11/30/2023)  Tobacco Use: Medium Risk (12/23/2023)  Health Literacy: Adequate Health Literacy (11/30/2023)   Readmission Risk Interventions    12/23/2023    1:56 PM 05/11/2023   10:47 AM  Readmission Risk Prevention Plan  Post Dischage Appt  Complete  Medication Screening  Complete  Transportation Screening Complete Complete  HRI or Home Care Consult Complete   Social Work Consult for Recovery Care Planning/Counseling Complete   Palliative Care Screening Not Applicable   Medication Review Oceanographer)  Complete

## 2023-12-24 NOTE — Progress Notes (Signed)
 [facility] Progress Note  Patient: Samuel Mcconnell    FMW:[id number]  DOA: 12/22/2023     Date of Service: the patient was seen and examined on 12/24/2023  Chief Complaint  Patient presents with   Fever   Shortness of Breath   Brief hospital course:  89 year old Caucasian male with a history of COPD, diastolic heart failure, A-fib on Eliquis , recent need for home oxygen, CKD stage IIIb/IV, who presents to the ER today via EMS due to cough, shortness of breath and hypoxia.  Daughter states that he was seen last week by his PCP and started on home oxygen due to hypoxia noted in the ER.  His home oxygen was never delivered.  He has been having progressive shortness of breath over the last 2 to 3 days.  He lives at [facility] in independent living.   Daughter states that his independent living has been doing renovations to the building including tearing up old carpet that has mold.   When EMS arrived, he was in obvious shortness of breath.  Family stated that he was hypoxic with room air saturations in the 70s.  He was placed on supplemental oxygen.  He was given a DuoNeb and given IV Solu-Medrol .   When he arrived in the ER, he was in obvious respiratory extremis and was placed immediately on BiPAP.   Temp 98.9 heart rate 86, blood pressure 172/86.  BiPAP at 28% FiO2.    Admission Labs: White count 12.1, hemoglobin 13.4, platelet 282 proBNP of 1034 Sodium 146, potassium 4.6, bicarb 33, BUN 41, creatinine 1.8, glucose 129 Total protein 6.9, albumin 4.4, AST 44, ALT of 17, alk phos of 65, total bili 0.4 COVID-negative, RSV negative, influenza negative Lactic acid of 1.6 Initial troponin of 97, second troponin of 86   Chest x-ray demonstrated emphysema with new perihilar opacities left greater than right since December 14, 2023.   Significant Events: Admitted 12/22/2023 acute on chronic respiratory failure. 12-22-2023 pt made DNR/DNI by EDP after discussion with pt's dtr [name]  at bedside.  Assessment and Plan:  # Acute on chronic respiratory failure with hypoxia (HCC) Continue telemetry S/p BiPAP, ABG improved Use BiPAP as needed Continue DuoNeb as needed Continue supplemental O2 in addition encouraged to wean off Patient may need home O2. Patient may need to be qualified for home oxygen and oxygen needs to be arranged by TOC.   COPD with acute exacerbation (HCC) 10/1 s/p IV Solu-Medrol  by EMS.  S/p prednisone  40 mg pod x 1 dose, unable to take oral meds due to acute delirium and agitation.   10/1 low-grade fever 100.3  CT chest: No acute finding Continue empiric antibiotics 10/3 transition to IV Solu-Medrol  20 mg daily for 3 days.  Started low-dose, steroids might be causing delirium     # Hypernatremia, mild.  Resolved 12/22/23 hold po lasix . Repeat BMP in AM. 10/2  started Lasix  20 mg IV daily, reassess hydration status tomorrow a.m.     # Acute respiratory failure with hypoxia  12/22/23 this was diagnosed last week when he was in the office. Daughter states that the patient's home oxygen was never delivered last week when was ordered. He will need a home oxygen evaluation and case management to help him secure home portable O2.  RVP panel negative   DNR (do not resuscitate)/DNI(Do Not Intubate) 12/22/23 confirmed DNR/DNI status with pt's dtr brenda. He will need DNR form completed for discharge.     Chronic diastolic CHF (congestive heart failure) (  HCC) 12/22/23 not exacerbated. Does not looks fluid overloaded. May be a little dehydrated given Na of 149. Hold lasix  for now.     Secondary hypercoagulable state 12/22/23 due to afib. On Eliquis      Persistent atrial fibrillation (HCC) 12/22/23 chronic. Continue amiodarone  and eliquis .     Bilateral lower extremity edema 12/22/23 chronic. Dtr and grand-dtr both state that his edema currently at +1-2 bilateral lower legs/ankles/feet is better than it is normally.     CKD stage 3b, GFR  30-44 ml/min (HCC) - baseline Scr 1.7-1.8 12/22/23 Scr 1.81, BUN 41. At his baseline Scr. Hold lasix  due to hypernatremia sCr 1.46    Hypophosphatemia, possible due to nutritional deficiency. Phos repleted Monitor and replete as needed   Essential hypertension 12/22/23 hold lasix  due to hypernatremia.   RLS: Requip  home dose Possible secondary to iron deficiency  Dementia and [facility] delirium Started Seroquel nightly 10/3 increase rectal, started Seroquel 25 mg p.o. daily and 50 mg in the p.m. Use Haldol as needed  # Iron deficiency, Tsat 5% Folate and B12 within normal range Checked iron profile secondary to restless leg syndrome. 10/3 started Venofer 3 mg IV daily x 3 days Start oral supplement on discharge Follow-up with PCP to repeat iron profile after 3 to 6 months  Body mass index is 24.07 kg/m.  Interventions:  Diet: Heart healthy diet DVT Prophylaxis: Eliquis   Advance goals of care discussion: DNR-limited  Family Communication: family was present at bedside, at the time of interview.  The pt provided permission to discuss medical plan with the family. Opportunity was given to ask question and all questions were answered satisfactorily.   Disposition:  Pt is from home, admitted with acute respiratory failure, still has respiratory distress and on supplemental O2 and elation, which precludes a safe discharge. Discharge to home with [facility] vs SNF, TBD when stable, may need few days to improve.  Subjective: As per family patient got hospital delirium and agitated around 2:30 AM in the morning.  During my exam patient was altered and sleepy, unable to offer any complaints, resting comfortably. Patient was wearing mittens and eyes were closed. Discussed management plan with family at bedside.  Physical Exam: General: NAD, lying comfortably Appear in no distress Eyes: PERRLA ENT: Oral Mucosa Clear, moist  Neck: no JVD,  Cardiovascular: S1 and S2 Present,  no Murmur,  Respiratory: good respiratory effort, Bilateral Air entry equal and Decreased, bilateral crackles, no significant wheezes  Abdomen: Bowel Sound present, Soft and no tenderness,  Skin: no rashes Extremities: no Pedal edema, no calf tenderness Neurologic: without any new focal findings Gait not checked due to patient safety concerns  Vitals:   12/24/23 0612 12/24/23 0807 12/24/23 1341 12/24/23 1458  BP:   128/81   Pulse:   68   Resp:   20   Temp:   98 F (36.7 C)   TempSrc:   Oral   SpO2: 98% 94% 99% 99%  Weight:      Height:        Intake/Output Summary (Last 24 hours) at 12/24/2023 1703 Last data filed at 12/24/2023 1340 Gross per 24 hour  Intake 100 ml  Output 3450 ml  Net -3350 ml   Filed Weights   12/22/23 2030  Weight: 79.4 kg    Data Reviewed: I have personally reviewed and interpreted daily labs, tele strips, imagings as discussed above. I reviewed all nursing notes, pharmacy notes, vitals, pertinent old records I have discussed plan of  care as described above with RN and patient/family.  CBC: Recent Labs  Lab 12/22/23 0941 12/23/23 0353 12/24/23 0601  WBC 12.1* 8.8 15.2*  NEUTROABS 5.3 7.6  --   HGB 13.4 11.5* 12.7*  HCT 43.6 36.9* 40.8  MCV 95.8 93.7 93.6  PLT 282 254 242   Basic Metabolic Panel: Recent Labs  Lab 12/22/23 0941 12/23/23 0353 12/24/23 0601  NA 146* 141 145  K 4.6 4.8 4.3  CL 103 102 102  CO2 33* 27 32  GLUCOSE 129* 130* 105*  BUN 41* 35* 40*  CREATININE 1.81* 1.46* 1.45*  CALCIUM 9.7 9.3 9.0  MG  --  2.4 2.3  PHOS  --  2.4* 3.3    Studies: DG Chest Port 1 View Result Date: 12/24/2023 CLINICAL DATA:  Hypoxia EXAM: PORTABLE CHEST 1 VIEW COMPARISON:  12/22/2023 FINDINGS: Low volume film. Cardiopericardial silhouette is at upper limits of normal for size. There is pulmonary vascular congestion without overt pulmonary edema. Calcification overlying the parahilar left lung compatible with calcified pleural disease seen  on previous chest CT of 12/22/2023. No acute bony abnormality. Telemetry leads overlie the chest. IMPRESSION: No substantial change. Low volume film with pulmonary vascular congestion. Electronically Signed   By: Camellia Candle M.D.   On: 12/24/2023 06:35    Scheduled Meds:  amiodarone   200 mg Oral Daily   apixaban   2.5 mg Oral Q12H   Or   enoxaparin (LOVENOX) injection  1 mg/kg Subcutaneous Q12H   aspirin   324 mg Oral Once   budesonide -glycopyrrolate-formoterol   2 puff Inhalation BID   fluticasone   2 spray Each Nare Daily   furosemide   20 mg Intravenous BID   guaiFENesin   600 mg Oral BID   Influenza vac split trivalent PF  0.5 mL Intramuscular Tomorrow-1000   ipratropium-albuterol   3 mL Nebulization Q6H   levothyroxine   137 mcg Oral QAC breakfast   loratadine  10 mg Oral Daily   methylPREDNISolone  (SOLU-MEDROL ) injection  20 mg Intravenous Daily   [START ON 12/25/2023] QUEtiapine  25 mg Oral Daily   QUEtiapine  50 mg Oral QPC supper   rOPINIRole   0.75 mg Oral QHS   Continuous Infusions:  cefTRIAXone  (ROCEPHIN )  IV 1 g (12/24/23 1113)   doxycycline  (VIBRAMYCIN ) IV 100 mg (12/24/23 1527)   PRN Meds: acetaminophen  **OR** acetaminophen , guaiFENesin -dextromethorphan, haloperidol lactate, ipratropium-albuterol , loperamide, ondansetron  **OR** ondansetron  (ZOFRAN ) IV, rOPINIRole   Time spent: 55 minutes  Author: ELVAN SOR. MD [facility] 12/24/2023 5:03 PM  To reach On-call, see care teams to locate the attending and reach out to them via [email or web address]. If 7PM-7AM, please contact night-coverage If you still have difficulty reaching the attending provider, please page the [facility] (Director on Call) for [facility] on amion for assistance.

## 2023-12-25 ENCOUNTER — Other Ambulatory Visit: Payer: Self-pay | Admitting: Cardiovascular Disease

## 2023-12-25 DIAGNOSIS — J9621 Acute and chronic respiratory failure with hypoxia: Secondary | ICD-10-CM | POA: Diagnosis not present

## 2023-12-25 LAB — CBC
HCT: 41.3 % (ref 39.0–52.0)
Hemoglobin: 12.8 g/dL — ABNORMAL LOW (ref 13.0–17.0)
MCH: 28.9 pg (ref 26.0–34.0)
MCHC: 31 g/dL (ref 30.0–36.0)
MCV: 93.2 fL (ref 80.0–100.0)
Platelets: 236 K/uL (ref 150–400)
RBC: 4.43 MIL/uL (ref 4.22–5.81)
RDW: 14.3 % (ref 11.5–15.5)
WBC: 13.2 K/uL — ABNORMAL HIGH (ref 4.0–10.5)
nRBC: 0 % (ref 0.0–0.2)

## 2023-12-25 LAB — BASIC METABOLIC PANEL WITH GFR
Anion gap: 10 (ref 5–15)
BUN: 42 mg/dL — ABNORMAL HIGH (ref 8–23)
CO2: 32 mmol/L (ref 22–32)
Calcium: 8.9 mg/dL (ref 8.9–10.3)
Chloride: 100 mmol/L (ref 98–111)
Creatinine, Ser: 1.34 mg/dL — ABNORMAL HIGH (ref 0.61–1.24)
GFR, Estimated: 49 mL/min — ABNORMAL LOW (ref >60)
Glucose, Bld: 107 mg/dL — ABNORMAL HIGH (ref 70–99)
Potassium: 4.5 mmol/L (ref 3.5–5.1)
Sodium: 142 mmol/L (ref 135–145)

## 2023-12-25 LAB — MAGNESIUM: Magnesium: 2.5 mg/dL — ABNORMAL HIGH (ref 1.7–2.4)

## 2023-12-25 LAB — PHOSPHORUS: Phosphorus: 3.8 mg/dL (ref 2.5–4.6)

## 2023-12-25 MED ORDER — QUETIAPINE FUMARATE 25 MG PO TABS
25.0000 mg | ORAL_TABLET | Freq: Every day | ORAL | Status: DC
  Administered 2023-12-25: 25 mg via ORAL
  Filled 2023-12-25: qty 1

## 2023-12-25 MED ORDER — ALUM & MAG HYDROXIDE-SIMETH 200-200-20 MG/5ML PO SUSP
30.0000 mL | ORAL | Status: DC | PRN
  Administered 2023-12-25: 30 mL via ORAL
  Filled 2023-12-25: qty 30

## 2023-12-25 NOTE — Evaluation (Signed)
 Clinical/Bedside Swallow Evaluation Patient Details  Name: Samuel Mcconnell MRN: [id number] Date of Birth: 1927-06-17  Today's Date: 12/25/2023 Time: SLP Start Time (ACUTE ONLY): 1408 SLP Stop Time (ACUTE ONLY): 1432 SLP Time Calculation (min) (ACUTE ONLY): 24 min  Past Medical History:  Past Medical History:  Diagnosis Date   Arthritis    Atrial fibrillation (HCC)    radiofrequency ablation   Cataract    CHF (congestive heart failure) (HCC)    Clotting disorder    due to medication   Emphysema    Emphysema of lung (HCC)    Hypertension    Hypoxemia 12/14/2023   Thyroid  disease    Ulcer    GI years prior   Past Surgical History:  Past Surgical History:  Procedure Laterality Date   CARDIAC SURGERY     CARDIOVERSION N/A 10/24/2019   Procedure: CARDIOVERSION;  Surgeon: Francyne Headland, MD;  Location: [facility] ENDOSCOPY;  Service: Cardiovascular;  Laterality: N/A;   CARDIOVERSION N/A 10/22/2020   Procedure: CARDIOVERSION;  Surgeon: Alveta Aleene PARAS, MD;  Location: [facility] ENDOSCOPY;  Service: Cardiovascular;  Laterality: N/A;   COLON SURGERY  03/23/1997   EYE SURGERY     KNEE ARTHROSCOPY Left 07/21/2000   LOOP RECORDER IMPLANT  01/16/2010   [employer] Reveal XT (Dr. HILARIO Jester)    RADIOFREQUENCY ABLATION  06/13/2009   SHOULDER ARTHROSCOPY WITH ROTATOR CUFF REPAIR Left 04/23/1996   TIBIAL TUBERCLERPLASTY  ~ 10/2010   TRANSTHORACIC ECHOCARDIOGRAM  11/19/2011   EF=>55%; mild MR, mild mitral annular calcif; mild TR, normal RSVP; AV mildly sclerotic, mild AV regurg; trace pulm valve regurg    HPI:  Patient is a 89 y.o. male with PMH: COPD, diastolic heart failure, a-fib, recent need for home oxygen, CKD stage IIIb/IV, dysphagia. He lives at an ILF [facility]). He presented to the hospital on 12/22/2023 with progressive SOB for 2-3 days prior. He was brought to hospital via EMS and was SOB and hypoxic at 70%. CXR showed COPD changes with perihilar opacities. He was initially on BiPAP but has  since been weaned to oxygen via [place].  He was admitted with acute on chronic respiratory failure with hypoxia, COPD with acute exacerbation. He was made NPO on 10/4 secondary to concerns for aspiration and SLP swallow evaluation was ordered.    Assessment / Plan / Recommendation  Clinical Impression  Patient presents with clinical s/s of dysphagia as per this bedside swallow study but with SLP suspecting that he has not had a significant decline in his baseline dysphagia seen on MBS x2 in 2023. Per family in the room, patient continues to eat rapidly, drink rapidly but he has not been using a straw since cup sips only were recommnended following MBS. Patient did recall and demonstrate left head turn, chin tuck strategy that was recommended after first of two MBS in 2023, however, he indicated he does not use this strategy anymore. SLP did review recommendations following the second MBS in 2023 which was for chin tuck only, as patient with difficulty coordinating head turn left and chin tuck, resulting in him turning his head while swallowing, leading to aspiration. Patient was able to demonstrate approximate chin tuck during today's evaluation but required some tactile cues to achieve full chin tuck posture. SLP assessed his swallow via sips of water, bites of applesauce and graham crackers. Prolonged mastication with solids as well as residuals remaining on lingual surface. One instances of delayed cough following thin liquids, however cough was congested sounding and was brief in  duration. SLP spoke with patient and family regarding recommendations for dys 3 (mechanical soft) solids, thin liquids via cup sips and use of chin tuck. He will benefit from full supervision from staff or family to reduce impulsive PO intake, cue for chin tuck and check for PO residuals and perform oral care after all PO's. SLP will follow briefly for toleration. SLP Visit Diagnosis: Dysphagia, oropharyngeal phase (R13.12)     Aspiration Risk  Mild aspiration risk;Moderate aspiration risk    Diet Recommendation Dysphagia 3 (Mech soft);Thin liquid    Liquid Administration via: No straw;Cup Medication Administration: Whole meds with puree Compensations: Slow rate;Small sips/bites;Minimize environmental distractions;Chin tuck Postural Changes: Seated upright at 90 degrees    Other  Recommendations Oral Care Recommendations: Oral care BID     Assistance Recommended at Discharge    Functional Status Assessment Patient has had a recent decline in their functional status and demonstrates the ability to make significant improvements in function in a reasonable and predictable amount of time.  Frequency and Duration min 2x/week  1 week       Prognosis Prognosis for improved oropharyngeal function: Good Barriers to Reach Goals: Time post onset      Swallow Study   General Date of Onset: 12/25/23 HPI: Patient is a 89 y.o. male with PMH: COPD, diastolic heart failure, a-fib, recent need for home oxygen, CKD stage IIIb/IV, dysphagia. He lives at an ILF [facility]). He presented to the hospital on 12/22/2023 with progressive SOB for 2-3 days prior. He was brought to hospital via EMS and was SOB and hypoxic at 70%. CXR showed COPD changes with perihilar opacities. He was initially on BiPAP but has since been weaned to oxygen via [place].  He was admitted with acute on chronic respiratory failure with hypoxia, COPD with acute exacerbation. He was made NPO on 10/4 secondary to concerns for aspiration and SLP swallow evaluation was ordered. Type of Study: Bedside Swallow Evaluation Previous Swallow Assessment: MBS x2 in 2023 Diet Prior to this Study: NPO Temperature Spikes Noted: No Respiratory Status: Nasal cannula History of Recent Intubation: No Behavior/Cognition: Alert;Cooperative;Pleasant mood;Requires cueing Oral Cavity Assessment: Dry Oral Cavity - Dentition: Dentures, top;Dentures, bottom;Other (Comment) (no  longer fit well per daughter) Self-Feeding Abilities: Able to feed self;Needs set up Patient Positioning: Upright in bed Baseline Vocal Quality: Normal Volitional Cough: Strong;Congested Volitional Swallow: Able to elicit    Oral/Motor/Sensory Function Overall Oral Motor/Sensory Function: Within functional limits   Ice Chips     Thin Liquid Thin Liquid: Impaired Presentation: Cup;Self Fed Pharyngeal  Phase Impairments: Cough - Delayed;Suspected delayed Swallow    Nectar Thick     Honey Thick     Puree Puree: Within functional limits Presentation: Spoon;Self Fed   Solid     Solid: Impaired Oral Phase Impairments: Impaired mastication Oral Phase Functional Implications: Oral residue      Norleen IVAR Blase, MA, CCC-SLP Speech Therapy

## 2023-12-25 NOTE — Progress Notes (Signed)
 PROGRESS NOTE  Samuel Mcconnell FMW:[id number] DOB: 05-19-1927 DOA: 12/22/2023 PCP: Alvia Bring, DO   LOS: 3 days   Brief narrative:   89 year old Caucasian male with a history of COPD, diastolic heart failure, A-fib on Eliquis , recent need for home oxygen, CKD stage IIIb/IV, presented to hospital with cough shortness of breath and hypoxia.  Patient was seen by PCP and was started on home oxygen for hypoxia recently.  Patient complains of progressive shortness of breath for 2 to 3 days.  Currently living at [facility] independent living facility.  Patient's daughter stated that daughter states that his independent living has been doing renovations to the building including tearing up old carpet that has mold.  Patient was brought into the hospital by EMS and was short of of breath and hypoxic with saturation in the 70s.  Initial vitals were notable for elevated blood pressure.  WBC was elevated at 12.1.  proBNP 1034.  Creatinine elevated at 1.8.  COVID RSV and influenza was negative.  Lactate was 1.6.  Troponin was slightly elevated at 97 followed by 86.  Chest x-ray showed COPD changes with perihilar opacities.  He was given DuoNebs Solu-Medrol  and subsequently was put on BiPAP.  Patient was then admitted hospital for further evaluation and treatment.  Assessment/Plan: Principal Problem:   Acute on chronic respiratory failure with hypoxia (HCC) Active Problems:   COPD with acute exacerbation (HCC)   Acute respiratory failure with hypoxia (HCC)   Hypernatremia   Essential hypertension   CKD stage 3b, GFR 30-44 ml/min (HCC) - baseline Scr 1.7-1.8   Bilateral lower extremity edema   Persistent atrial fibrillation (HCC)   Secondary hypercoagulable state   Chronic diastolic CHF (congestive heart failure) (HCC)   DNR (do not resuscitate)/DNI(Do Not Intubate)   Acute on chronic respiratory failure with hypoxia, possible perihilar pneumonia Nasal cannula oxygen.  Was initially on BiPAP.   Continue DuoNebs.  Will continue to wean off as possible.  Blood cultures negative in 3 days.  Respiratory viral panel negative.  Patient was diagnosed of hypoxia in the office and was supposed to get oxygen which was not delivered.  Continue Rocephin  and doxycycline   Dysphagia.  Plan for speech therapy evaluation.  Continue with IV fluid hydration for now.   COPD with acute exacerbation (HCC) On low-dose IV Solu-Medrol .  Continue bronchodilators.  Wean down on oxygen as able.  Currently on 5% oxygen.   Hypernatremia, mild.  Resolved Latest sodium of 142.   Chronic diastolic CHF (congestive heart failure) (HCC) Currently compensated.  Slightly volume depleted today due to lack of oral intake.  Will continue with hydration today.    Secondary hypercoagulable state Secondary to A-fib.  On Eliquis .  Continue.    Persistent atrial fibrillation  Continue amiodarone  and eliquis .    Bilateral lower extremity edema Chronic findings.  Actually better at this time     CKD stage 3b, GFR 30-44 ml/min (HCC) - baseline Scr 1.7-1.8 Initial serum creatinine Scr 1.81, BUN 41.  Lasix  on hold.  Has been started on some gentle fluids due to lack of poor oral intake.  Latest creatinine of 1.3   Hypophosphatemia, Replenished and improved.  Essential hypertension Stable at this time.   RLS: On Requip .   Dementia and sundowning with hospital induced delirium. With increased somnolence we will decrease the dose of Seroquel.  Roquel 25 mg p.o. daily and 50 mg in the p.m. Use Haldol as needed   # Iron deficiency, Tsat 5% Receiving IV Venofer  daily for 3 days.  DVT prophylaxis: apixaban  (ELIQUIS ) tablet 2.5 mg Start: 12/24/23 1800 SCDs Start: 12/22/23 1401 apixaban  (ELIQUIS ) tablet 2.5 mg   Disposition: Patient with guarded prognosis.  Had a prolonged discussion with the patient's daughter at bedside.    Status is: Inpatient Remains inpatient appropriate because: Pending clinical improvement.     Code Status:     Code Status: Limited: Do not attempt resuscitation (DNR) -DNR-LIMITED -Do Not Intubate/DNI   Family Communication: Spoke with the patient's daughter and granddaughter at bedside.  Consultants: None  Procedures: BiPAP  Anti-infectives:  Rocephin  and doxycycline   Anti-infectives (From admission, onward)    Start     Dose/Rate Route Frequency Ordered Stop   12/24/23 1400  doxycycline  (VIBRAMYCIN ) 100 mg in sodium chloride  0.9 % 250 mL IVPB        100 mg 125 mL/hr over 120 Minutes Intravenous Every 12 hours 12/24/23 1313     12/23/23 1000  cefTRIAXone  (ROCEPHIN ) 1 g in sodium chloride  0.9 % 100 mL IVPB        1 g 200 mL/hr over 30 Minutes Intravenous Every 24 hours 12/22/23 1953     12/23/23 1000  azithromycin  (ZITHROMAX ) tablet 500 mg  Status:  Discontinued        500 mg Oral Daily 12/22/23 1953 12/24/23 1311   12/22/23 1030  cefTRIAXone  (ROCEPHIN ) 1 g in sodium chloride  0.9 % 100 mL IVPB        1 g 200 mL/hr over 30 Minutes Intravenous  Once 12/22/23 1015 12/22/23 1110   12/22/23 1030  azithromycin  (ZITHROMAX ) 500 mg in sodium chloride  0.9 % 250 mL IVPB        500 mg 250 mL/hr over 60 Minutes Intravenous  Once 12/22/23 1015 12/22/23 1226       Subjective: Today patient was seen and examined.  Patient appears to be somnolent but able to wake up on verbal command.  Family at bedside inquiring about decreased oral hydration  Objective: Vitals:   12/25/23 1352 12/25/23 1354  BP: 139/70   Pulse: 62 62  Resp:  (!) 21  Temp: 98.4 F (36.9 C)   SpO2: 97% 97%    Intake/Output Summary (Last 24 hours) at 12/25/2023 1407 Last data filed at 12/25/2023 0511 Gross per 24 hour  Intake 865 ml  Output 1275 ml  Net -410 ml   Filed Weights   12/22/23 2030  Weight: 79.4 kg   Body mass index is 24.07 kg/m.   Physical Exam:  GENERAL: Patient is mildly somnolent but alert awake when verbally stimulated, Communicative, has cognitive dysfunction from dementia.   Elderly male, on nasal cannula oxygen, HENT: No scleral pallor or icterus. Pupils equally reactive to light. Oral mucosa is moist NECK: is supple, no gross swelling noted. CHEST: Respirations bilaterally.  Coarse breath sounds noted. CVS: S1 and S2 heard, no murmur. Regular rate and rhythm.  ABDOMEN: Soft, non-tender, bowel sounds are present. EXTREMITIES: No edema. CNS: Moves all extremities SKIN: warm and dry without rashes.  Data Review: I have personally reviewed the following laboratory data and studies,  CBC: Recent Labs  Lab 12/22/23 0941 12/23/23 0353 12/24/23 0601 12/25/23 0418  WBC 12.1* 8.8 15.2* 13.2*  NEUTROABS 5.3 7.6  --   --   HGB 13.4 11.5* 12.7* 12.8*  HCT 43.6 36.9* 40.8 41.3  MCV 95.8 93.7 93.6 93.2  PLT 282 254 242 236   Basic Metabolic Panel: Recent Labs  Lab 12/22/23 0941 12/23/23 0353 12/24/23 0601 12/25/23  0418  NA 146* 141 145 142  K 4.6 4.8 4.3 4.5  CL 103 102 102 100  CO2 33* 27 32 32  GLUCOSE 129* 130* 105* 107*  BUN 41* 35* 40* 42*  CREATININE 1.81* 1.46* 1.45* 1.34*  CALCIUM 9.7 9.3 9.0 8.9  MG  --  2.4 2.3 2.5*  PHOS  --  2.4* 3.3 3.8   Liver Function Tests: Recent Labs  Lab 12/22/23 0941 12/23/23 0353  AST 44* 49*  ALT 17 27  ALKPHOS 65 54  BILITOT 0.4 0.5  PROT 6.9 5.7*  ALBUMIN 4.4 3.6   No results for input(s): LIPASE, AMYLASE in the last 168 hours. No results for input(s): AMMONIA in the last 168 hours. Cardiac Enzymes: No results for input(s): CKTOTAL, CKMB, CKMBINDEX, TROPONINI in the last 168 hours. BNP (last 3 results) Recent Labs    05/09/23 1259 12/14/23 1216  BNP 130.2* 62.8    ProBNP (last 3 results) Recent Labs    12/22/23 0941  PROBNP 1,034.0*    CBG: No results for input(s): GLUCAP in the last 168 hours. Recent Results (from the past 240 hours)  Blood culture (routine x 2)     Status: None (Preliminary result)   Collection Time: 12/22/23  9:41 AM   Specimen: BLOOD  Result  Value Ref Range Status   Specimen Description   Final    BLOOD RIGHT ANTECUBITAL Performed at [facility], [address] [place], [place] [address]    Special Requests   Final    BOTTLES DRAWN AEROBIC AND ANAEROBIC Blood Culture adequate volume Performed at [facility], [address], [place], [place] [address]    Culture   Final    NO GROWTH 3 DAYS Performed at [facility], [address], [place], [place] [address]    Report Status PENDING  Incomplete  Resp panel by RT-PCR (RSV, Flu A&B, Covid)     Status: None   Collection Time: 12/22/23  9:42 AM   Specimen: Nasal Swab  Result Value Ref Range Status   SARS Coronavirus 2 by RT PCR NEGATIVE NEGATIVE Final    Comment: (NOTE) SARS-CoV-2 target nucleic acids are NOT DETECTED.  The SARS-CoV-2 RNA is generally detectable in upper respiratory specimens during the acute phase of infection. The lowest concentration of SARS-CoV-2 viral copies this assay can detect is 138 copies/mL. A negative result does not preclude SARS-Cov-2 infection and should not be used as the sole basis for treatment or other patient management decisions. A negative result may occur with  improper specimen collection/handling, submission of specimen other than nasopharyngeal swab, presence of viral mutation(s) within the areas targeted by this assay, and inadequate number of viral copies(<138 copies/mL). A negative result must be combined with clinical observations, patient history, and epidemiological information. The expected result is Negative.  Fact Sheet for Patients:  BloggerCourse.com  Fact Sheet for Healthcare Providers:  SeriousBroker.it  This test is no t yet approved or cleared by the [place]  FDA and  has been authorized for detection and/or diagnosis of SARS-CoV-2 by FDA under an Emergency Use Authorization (EUA). This EUA will remain  in  effect (meaning this test can be used) for the duration of the COVID-19 declaration under Section 564(b)(1) of the Act, 21 U.S.C.section 360bbb-3(b)(1), unless the authorization is terminated  or revoked sooner.       Influenza A by PCR NEGATIVE NEGATIVE Final   Influenza B by PCR NEGATIVE NEGATIVE Final  Comment: (NOTE) The Xpert Xpress SARS-CoV-2/FLU/RSV plus assay is intended as an aid in the diagnosis of influenza from Nasopharyngeal swab specimens and should not be used as a sole basis for treatment. Nasal washings and aspirates are unacceptable for Xpert Xpress SARS-CoV-2/FLU/RSV testing.  Fact Sheet for Patients: BloggerCourse.com  Fact Sheet for Healthcare Providers: SeriousBroker.it  This test is not yet approved or cleared by the [place]  FDA and has been authorized for detection and/or diagnosis of SARS-CoV-2 by FDA under an Emergency Use Authorization (EUA). This EUA will remain in effect (meaning this test can be used) for the duration of the COVID-19 declaration under Section 564(b)(1) of the Act, 21 U.S.C. section 360bbb-3(b)(1), unless the authorization is terminated or revoked.     Resp Syncytial Virus by PCR NEGATIVE NEGATIVE Final    Comment: (NOTE) Fact Sheet for Patients: BloggerCourse.com  Fact Sheet for Healthcare Providers: SeriousBroker.it  This test is not yet approved or cleared by the [place]  FDA and has been authorized for detection and/or diagnosis of SARS-CoV-2 by FDA under an Emergency Use Authorization (EUA). This EUA will remain in effect (meaning this test can be used) for the duration of the COVID-19 declaration under Section 564(b)(1) of the Act, 21 U.S.C. section 360bbb-3(b)(1), unless the authorization is terminated or revoked.  Performed at [facility], [address] [place], [place] [address]    Blood culture (routine x 2)     Status: None (Preliminary result)   Collection Time: 12/22/23  9:46 AM   Specimen: BLOOD  Result Value Ref Range Status   Specimen Description   Final    BLOOD LEFT ANTECUBITAL Performed at [facility], [address], [place], [place] [address]    Special Requests   Final    BOTTLES DRAWN AEROBIC AND ANAEROBIC Blood Culture results may not be optimal due to an inadequate volume of blood received in culture bottles Performed at [facility], [address], [place], [place] [address]    Culture   Final    NO GROWTH 3 DAYS Performed at [facility], [address] [place], [place] [address]    Report Status PENDING  Incomplete  Respiratory (~20 pathogens) panel by PCR     Status: None   Collection Time: 12/23/23  9:04 AM   Specimen: Nasopharyngeal Swab; Respiratory  Result Value Ref Range Status   Adenovirus NOT DETECTED NOT DETECTED Final   Coronavirus 229E NOT DETECTED NOT DETECTED Final    Comment: (NOTE) The Coronavirus on the Respiratory Panel, DOES NOT test for the novel  Coronavirus (2019 nCoV)    Coronavirus HKU1 NOT DETECTED NOT DETECTED Final   Coronavirus NL63 NOT DETECTED NOT DETECTED Final   Coronavirus OC43 NOT DETECTED NOT DETECTED Final   Metapneumovirus NOT DETECTED NOT DETECTED Final   Rhinovirus / Enterovirus NOT DETECTED NOT DETECTED Final   Influenza A NOT DETECTED NOT DETECTED Final   Influenza B NOT DETECTED NOT DETECTED Final   Parainfluenza Virus 1 NOT DETECTED NOT DETECTED Final   Parainfluenza Virus 2 NOT DETECTED NOT DETECTED Final   Parainfluenza Virus 3 NOT DETECTED NOT DETECTED Final   Parainfluenza Virus 4 NOT DETECTED NOT DETECTED Final   Respiratory Syncytial Virus NOT DETECTED NOT DETECTED Final   Bordetella pertussis NOT DETECTED NOT DETECTED Final   Bordetella Parapertussis NOT DETECTED NOT DETECTED Final   Chlamydophila pneumoniae NOT DETECTED NOT DETECTED  Final   Mycoplasma pneumoniae NOT DETECTED NOT DETECTED Final  Comment: Performed at [facility], [address], [place], [place] [address]     Studies: DG Chest Port 1 View Result Date: 12/24/2023 CLINICAL DATA:  Hypoxia EXAM: PORTABLE CHEST 1 VIEW COMPARISON:  12/22/2023 FINDINGS: Low volume film. Cardiopericardial silhouette is at upper limits of normal for size. There is pulmonary vascular congestion without overt pulmonary edema. Calcification overlying the parahilar left lung compatible with calcified pleural disease seen on previous chest CT of 12/22/2023. No acute bony abnormality. Telemetry leads overlie the chest. IMPRESSION: No substantial change. Low volume film with pulmonary vascular congestion. Electronically Signed   By: Camellia Candle M.D.   On: 12/24/2023 06:35      Vernal Alstrom, MD  [facility] 12/25/2023  If 7PM-7AM, please contact night-coverage

## 2023-12-25 NOTE — Progress Notes (Signed)
   12/25/23 2326  BiPAP/CPAP/SIPAP  Reason BIPAP/CPAP not in use Non-compliant (PATIENT HAS NOT BEEN WEARING. FAMILY SAID HE DID NOT NEED TO WEAR. THE CPAP HAS BEEN TAKEN OUT OF THE ROOM.)

## 2023-12-25 NOTE — Progress Notes (Signed)
 Physical Therapy Treatment Patient Details Name: Samuel Mcconnell MRN: [id number] DOB: 01-12-1928 Today's Date: 12/25/2023   History of Present Illness 89 year old Caucasian, who presented to the ER 12/22/23  via EMS due to cough, shortness of breath and hypoxia.  Daughter states that he was seen last week by his PCP and started on home oxygen due to hypoxia noted in the ER.  His home oxygen was never delivered.  He has been having progressive shortness of breath over the last 2 to 3 days. Dx of Acute on chronic respiratory failure with hypoxia. Pt with a history of COPD, diastolic heart failure, A-fib on Eliquis , recent need for home oxygen, CKD stage IIIb/IV    PT Comments  Improved alertness.  Feels overly tired.  Sleeping but easily aroused and with a few minutes to orient, Pt was able to recall he was in the hospital and when I asked him what happened inorder for him to be here, he stated Pnemonia.  Per chart review, still having some confusion at night as well as restlessness.  Prior Pt resided at [facility] and was able to amb IND with his Rollator.    Today, Assisted OOB to amb was difficult.  General bed mobility comments: Pt was attempting to self sit EOB but required + 2 Max Assist esp with scooting to EOB due to weakness/fatigue and also presented with posterior lean with poor self correction to midline. General transfer comment: Poor sitting balance EOB, Pt required + 2 side by side assist to rise present with posterior lean updond standing and tremors throughout. Required assist for a controlled stand to sit into recliner due to max fatigue post amb.  HIGH FALL RISK. General Gait Details: VERY UNSTEADY gait requiring + 2 side by side Max Assist with third assist following with recliner.  Severe posterior lean and decreased weight shift to left.  Steps are short, shuffled with intermittent pattern.  Balance is poor as he presents with a delayed self correction reaction  responce.  MAX c/o weakness and fatigue.  Remained on 5 lts nasal with avg sats low 90s.  VC's to take deep breaths resulted in a weak, shallow cough.  Avg HR 89.  Returned to room in recliner.  HIGH FALL RISK.  Deconditioned but with potential to recover. Unable to wean oxygen levels lower than 5 lts.  Encouraged Pt to take deep breaths a cough.  Family aware of chin tuck with swallowing.   Family agrees to SNF for ST Rehab then plans to have Pt to return to his ILF.  Pt progressing slowly and presents with good Rehab potential.    If plan is discharge home, recommend the following: Two people to help with walking and/or transfers;Two people to help with bathing/dressing/bathroom;Assistance with cooking/housework;Direct supervision/assist for medications management;Direct supervision/assist for financial management;Assist for transportation;Help with stairs or ramp for entrance   Can travel by private vehicle     No  Equipment Recommendations  None recommended by PT    Recommendations for Other Services       Precautions / Restrictions Precautions Precautions: Fall Precaution/Restrictions Comments: 3-4 falls in past 6 months Restrictions Weight Bearing Restrictions Per Provider Order: No     Mobility  Bed Mobility Overal bed mobility: Needs Assistance Bed Mobility: Supine to Sit     Supine to sit: Max assist, +2 for physical assistance, +2 for safety/equipment     General bed mobility comments: Pt was attempting to self sit EOB but required +  2 Max Assist esp with scooting to EOB due to weakness/fatigue and also presented with posterior lean with poor self correction to midline.    Transfers Overall transfer level: Needs assistance Equipment used: Rolling walker (2 wheels) Transfers: Sit to/from Stand Sit to Stand: Mod assist, Max assist, +2 physical assistance, +2 safety/equipment, From elevated surface           General transfer comment: Poor sitting balance EOB,  Pt required + 2 side by side assist to rise present with posterior lean updond standing and tremors throughout. Required assist for a controlled stand to sit into recliner due to max fatigue post amb.  HIGH FALL RISK.    Ambulation/Gait Ambulation/Gait assistance: Max assist, +2 physical assistance, +2 safety/equipment Gait Distance (Feet): 48 Feet Assistive device: Rolling walker (2 wheels) Gait Pattern/deviations: Step-to pattern, Decreased step length - left, Decreased step length - right, Staggering left, Staggering right, Narrow base of support, Ataxic, Decreased weight shift to left Gait velocity: decreased     General Gait Details: VERY UNSTEADY gait requiring + 2 side by side Max Assist with third assist following with recliner.  Severe posterior lean and decreased weight shift to left.  Steps are short, shuffled with intermittent pattern.  Balance is poor as he presents with a delayed self correction reaction responce.  MAX c/o weakness and fatigue.  Remained on 5 lts nasal with avg sats low 90s.  VC's to take deep breaths resulted in a weak, shallow cough.  Avg HR 89.  Returned to room in recliner.  HIGH FALL RISK.  Deconditioned but with potential to recover.   Stairs             Wheelchair Mobility     Tilt Bed    Modified Rankin (Stroke Patients Only)       Balance                                            Communication Communication Factors Affecting Communication: Hearing impaired  Cognition Arousal: Alert                             PT - Cognition Comments: improved alertness.  Feels overly tired.  Sleeping but easily aroused and with a few minutes to orient, Pt was able to recall he was in the hospital and when I asked him what happened inorder for him to be here, he stated Pnemonia.  Per chart review, still having some confusion at night as well as restlessness.   Following commands impaired: Follows one step commands  with increased time    Cueing Cueing Techniques: Verbal cues  Exercises      General Comments        Pertinent Vitals/Pain Pain Assessment Pain Assessment: No/denies pain    Home Living                          Prior Function            PT Goals (current goals can now be found in the care plan section) Progress towards PT goals: Progressing toward goals    Frequency    Min 2X/week      PT Plan      Co-evaluation  AM-PAC PT 6 Clicks Mobility   Outcome Measure  Help needed turning from your back to your side while in a flat bed without using bedrails?: A Lot Help needed moving from lying on your back to sitting on the side of a flat bed without using bedrails?: A Lot Help needed moving to and from a bed to a chair (including a wheelchair)?: A Lot Help needed standing up from a chair using your arms (e.g., wheelchair or bedside chair)?: A Lot Help needed to walk in hospital room?: A Lot Help needed climbing 3-5 steps with a railing? : Total 6 Click Score: 11    End of Session Equipment Utilized During Treatment: Gait belt Activity Tolerance: Patient limited by fatigue Patient left: in chair;with call bell/phone within reach;with family/visitor present Nurse Communication: Mobility status PT Visit Diagnosis: Difficulty in walking, not elsewhere classified (R26.2);History of falling (Z91.81)     Time: 1536-1600 PT Time Calculation (min) (ACUTE ONLY): 24 min  Charges:    $Gait Training: 8-22 mins $Therapeutic Activity: 8-22 mins PT General Charges $$ ACUTE PT VISIT: 1 Visit                     Katheryn Leap  PTA Acute  Rehabilitation Services Office M-F          [phone]

## 2023-12-25 NOTE — Plan of Care (Signed)

## 2023-12-26 DIAGNOSIS — J9621 Acute and chronic respiratory failure with hypoxia: Secondary | ICD-10-CM | POA: Diagnosis not present

## 2023-12-26 LAB — BASIC METABOLIC PANEL WITH GFR
Anion gap: 9 (ref 5–15)
BUN: 40 mg/dL — ABNORMAL HIGH (ref 8–23)
CO2: 30 mmol/L (ref 22–32)
Calcium: 8.7 mg/dL — ABNORMAL LOW (ref 8.9–10.3)
Chloride: 101 mmol/L (ref 98–111)
Creatinine, Ser: 1.24 mg/dL (ref 0.61–1.24)
GFR, Estimated: 54 mL/min — ABNORMAL LOW (ref >60)
Glucose, Bld: 98 mg/dL (ref 70–99)
Potassium: 4.2 mmol/L (ref 3.5–5.1)
Sodium: 140 mmol/L (ref 135–145)

## 2023-12-26 LAB — MAGNESIUM: Magnesium: 2.4 mg/dL (ref 1.7–2.4)

## 2023-12-26 LAB — PHOSPHORUS: Phosphorus: 2.4 mg/dL — ABNORMAL LOW (ref 2.5–4.6)

## 2023-12-26 LAB — CBC
HCT: 40.6 % (ref 39.0–52.0)
Hemoglobin: 12.6 g/dL — ABNORMAL LOW (ref 13.0–17.0)
MCH: 29 pg (ref 26.0–34.0)
MCHC: 31 g/dL (ref 30.0–36.0)
MCV: 93.3 fL (ref 80.0–100.0)
Platelets: 223 K/uL (ref 150–400)
RBC: 4.35 MIL/uL (ref 4.22–5.81)
RDW: 14.2 % (ref 11.5–15.5)
WBC: 12 K/uL — ABNORMAL HIGH (ref 4.0–10.5)
nRBC: 0 % (ref 0.0–0.2)

## 2023-12-26 NOTE — Progress Notes (Signed)
 PROGRESS NOTE  Samuel Mcconnell FMW:[id number] DOB: 1927/05/27 DOA: 12/22/2023 PCP: Alvia Bring, DO   LOS: 4 days   Brief narrative:   89 year old Caucasian male with a history of COPD, diastolic heart failure, A-fib on Eliquis , recent need for home oxygen, CKD stage IIIb/IV, presented to hospital with cough shortness of breath and hypoxia.  Patient was seen by PCP and was started on home oxygen for hypoxia recently.  Patient complains of progressive shortness of breath for 2 to 3 days.  Currently living at [facility] independent living facility.  Patient's daughter stated that daughter states that his independent living has been doing renovations to the building including tearing up old carpet that has mold.  Patient was brought into the hospital by EMS and was short of of breath and hypoxic with saturation in the 70s.  Initial vitals were notable for elevated blood pressure.  WBC was elevated at 12.1.  proBNP 1034.  Creatinine elevated at 1.8.  COVID RSV and influenza was negative.  Lactate was 1.6.  Troponin was slightly elevated at 97 followed by 86.  Chest x-ray showed COPD changes with perihilar opacities.  He was given DuoNebs Solu-Medrol  and subsequently was put on BiPAP.  Patient was then admitted hospital for further evaluation and treatment.  Assessment/Plan: Principal Problem:   Acute on chronic respiratory failure with hypoxia (HCC) Active Problems:   COPD with acute exacerbation (HCC)   Acute respiratory failure with hypoxia (HCC)   Hypernatremia   Essential hypertension   CKD stage 3b, GFR 30-44 ml/min (HCC) - baseline Scr 1.7-1.8   Bilateral lower extremity edema   Persistent atrial fibrillation (HCC)   Secondary hypercoagulable state   Chronic diastolic CHF (congestive heart failure) (HCC)   DNR (do not resuscitate)/DNI(Do Not Intubate)   Acute on chronic respiratory failure with hypoxia, possible perihilar pneumonia   Was initially on BiPAP.  Continue DuoNebs.  Will  continue to wean off as possible.  Blood cultures negative in 4 days.  Respiratory viral panel negative.  Patient was diagnosed of hypoxia in the office and was supposed to get oxygen which was not delivered.  Continue Rocephin  and doxycycline   Dysphagia.  Seen by speech therapy and recommend mechanical soft diet with thin liquids.  Continue aspiration precautions.   COPD with acute exacerbation  On low-dose IV Solu-Medrol .  Continue bronchodilators.  Wean down on oxygen as able.  On nasal cannula oxygen at 5 L/min.   Hypernatremia, mild.  Resolved Latest sodium of 142.   Chronic diastolic CHF (congestive heart failure)  Currently compensated.  Slightly volume depleted today due to lack of oral intake.  Received hydration.  Will hold off today.    Secondary hypercoagulable state Secondary to A-fib.  On Eliquis .  Continue.    Persistent atrial fibrillation currently rate controlled Continue amiodarone  and eliquis .    Bilateral lower extremity edema Chronic findings.    CKD stage 3b, GFR 30-44 ml/min (HCC) - baseline Scr 1.7-1.8 Initial serum creatinine Scr 1.81, BUN 41.  Received IV fluids.  Lasix  on hold latest creatinine of 1.2   Hypophosphatemia, Replenished and improved.  Essential hypertension Stable at this time.  Latest blood pressure of 136/86   Restless leg syndrome.  On Requip .  Will continue to   Dementia and sundowning with hospital induced delirium. Improved.  On Seroquel.  Patient's family is concerned about it and will discontinue Seroquel for now. Use Haldol as needed   # Iron deficiency, Tsat 5% Receiving IV Venofer daily for 3  days.  Hemoglobin stable.  Will consider oral iron supplementation on discharge.  DVT prophylaxis: apixaban  (ELIQUIS ) tablet 2.5 mg Start: 12/24/23 1800 SCDs Start: 12/22/23 1401 apixaban  (ELIQUIS ) tablet 2.5 mg   Disposition: Skilled nursing facility as per PT  Status is: Inpatient Remains inpatient appropriate because: Pending  clinical improvement, need for rehabitation.    Code Status:     Code Status: Limited: Do not attempt resuscitation (DNR) -DNR-LIMITED -Do Not Intubate/DNI   Family Communication: Spoke with the patient's daughter and granddaughter at bedside again today..  Consultants: None  Procedures: BiPAP  Anti-infectives:  Rocephin  and doxycycline   Anti-infectives (From admission, onward)    Start     Dose/Rate Route Frequency Ordered Stop   12/24/23 1400  doxycycline  (VIBRAMYCIN ) 100 mg in sodium chloride  0.9 % 250 mL IVPB        100 mg 125 mL/hr over 120 Minutes Intravenous Every 12 hours 12/24/23 1313     12/23/23 1000  cefTRIAXone  (ROCEPHIN ) 1 g in sodium chloride  0.9 % 100 mL IVPB        1 g 200 mL/hr over 30 Minutes Intravenous Every 24 hours 12/22/23 1953     12/23/23 1000  azithromycin  (ZITHROMAX ) tablet 500 mg  Status:  Discontinued        500 mg Oral Daily 12/22/23 1953 12/24/23 1311   12/22/23 1030  cefTRIAXone  (ROCEPHIN ) 1 g in sodium chloride  0.9 % 100 mL IVPB        1 g 200 mL/hr over 30 Minutes Intravenous  Once 12/22/23 1015 12/22/23 1110   12/22/23 1030  azithromycin  (ZITHROMAX ) 500 mg in sodium chloride  0.9 % 250 mL IVPB        500 mg 250 mL/hr over 60 Minutes Intravenous  Once 12/22/23 1015 12/22/23 1226       Subjective: Today patient was seen and examined.  Patient appears to be more alert awake and Communicative.  Patient's family at bedside.  Was able to handle mechanical soft diet okay.  Denies any dyspnea nausea vomiting.  On nasal cannula oxygen  Objective: Vitals:   12/26/23 0508 12/26/23 0850  BP: 136/86   Pulse: (!) 59   Resp: 16   Temp: 98.7 F (37.1 C)   SpO2: 95% 92%    Intake/Output Summary (Last 24 hours) at 12/26/2023 1403 Last data filed at 12/26/2023 1100 Gross per 24 hour  Intake 590 ml  Output 1200 ml  Net -610 ml   Filed Weights   12/22/23 2030  Weight: 79.4 kg   Body mass index is 24.07 kg/m.   Physical Exam:  GENERAL:  Patient is alert awake and Communicative.  cognitive dysfunction from dementia.  Elderly male, on nasal cannula oxygen, HENT: No scleral pallor or icterus. Pupils equally reactive to light. Oral mucosa is moist NECK: is supple, no gross swelling noted. CHEST: Diminished breath sounds bilaterally.  Coarse breath sounds noted. CVS: S1 and S2 heard, no murmur. Regular rate and rhythm.  ABDOMEN: Soft, non-tender, bowel sounds are present. EXTREMITIES: No edema. CNS: Moves all extremities SKIN: warm and dry without rashes.  Data Review: I have personally reviewed the following laboratory data and studies,  CBC: Recent Labs  Lab 12/22/23 0941 12/23/23 0353 12/24/23 0601 12/25/23 0418 12/26/23 0500  WBC 12.1* 8.8 15.2* 13.2* 12.0*  NEUTROABS 5.3 7.6  --   --   --   HGB 13.4 11.5* 12.7* 12.8* 12.6*  HCT 43.6 36.9* 40.8 41.3 40.6  MCV 95.8 93.7 93.6 93.2 93.3  PLT  282 254 242 236 223   Basic Metabolic Panel: Recent Labs  Lab 12/22/23 0941 12/23/23 0353 12/24/23 0601 12/25/23 0418 12/26/23 0500  NA 146* 141 145 142 140  K 4.6 4.8 4.3 4.5 4.2  CL 103 102 102 100 101  CO2 33* 27 32 32 30  GLUCOSE 129* 130* 105* 107* 98  BUN 41* 35* 40* 42* 40*  CREATININE 1.81* 1.46* 1.45* 1.34* 1.24  CALCIUM 9.7 9.3 9.0 8.9 8.7*  MG  --  2.4 2.3 2.5* 2.4  PHOS  --  2.4* 3.3 3.8 2.4*   Liver Function Tests: Recent Labs  Lab 12/22/23 0941 12/23/23 0353  AST 44* 49*  ALT 17 27  ALKPHOS 65 54  BILITOT 0.4 0.5  PROT 6.9 5.7*  ALBUMIN 4.4 3.6   No results for input(s): LIPASE, AMYLASE in the last 168 hours. No results for input(s): AMMONIA in the last 168 hours. Cardiac Enzymes: No results for input(s): CKTOTAL, CKMB, CKMBINDEX, TROPONINI in the last 168 hours. BNP (last 3 results) Recent Labs    05/09/23 1259 12/14/23 1216  BNP 130.2* 62.8    ProBNP (last 3 results) Recent Labs    12/22/23 0941  PROBNP 1,034.0*    CBG: No results for input(s): GLUCAP in  the last 168 hours. Recent Results (from the past 240 hours)  Blood culture (routine x 2)     Status: None (Preliminary result)   Collection Time: 12/22/23  9:41 AM   Specimen: BLOOD  Result Value Ref Range Status   Specimen Description   Final    BLOOD RIGHT ANTECUBITAL Performed at [facility], [address], [place], [place] [address]    Special Requests   Final    BOTTLES DRAWN AEROBIC AND ANAEROBIC Blood Culture adequate volume Performed at [facility], [address], [place], [place] [address]    Culture   Final    NO GROWTH 4 DAYS Performed at [facility], [address], [place], [place] [address]    Report Status PENDING  Incomplete  Resp panel by RT-PCR (RSV, Flu A&B, Covid)     Status: None   Collection Time: 12/22/23  9:42 AM   Specimen: Nasal Swab  Result Value Ref Range Status   SARS Coronavirus 2 by RT PCR NEGATIVE NEGATIVE Final    Comment: (NOTE) SARS-CoV-2 target nucleic acids are NOT DETECTED.  The SARS-CoV-2 RNA is generally detectable in upper respiratory specimens during the acute phase of infection. The lowest concentration of SARS-CoV-2 viral copies this assay can detect is 138 copies/mL. A negative result does not preclude SARS-Cov-2 infection and should not be used as the sole basis for treatment or other patient management decisions. A negative result may occur with  improper specimen collection/handling, submission of specimen other than nasopharyngeal swab, presence of viral mutation(s) within the areas targeted by this assay, and inadequate number of viral copies(<138 copies/mL). A negative result must be combined with clinical observations, patient history, and epidemiological information. The expected result is Negative.  Fact Sheet for Patients:  BloggerCourse.com  Fact Sheet for Healthcare Providers:  SeriousBroker.it  This test is no t yet  approved or cleared by the [place]  FDA and  has been authorized for detection and/or diagnosis of SARS-CoV-2 by FDA under an Emergency Use Authorization (EUA). This EUA will remain  in effect (meaning this test can be used) for the duration of the COVID-19 declaration under Section 564(b)(1) of the Act, 21 U.S.C.section 360bbb-3(b)(1),  unless the authorization is terminated  or revoked sooner.       Influenza A by PCR NEGATIVE NEGATIVE Final   Influenza B by PCR NEGATIVE NEGATIVE Final    Comment: (NOTE) The Xpert Xpress SARS-CoV-2/FLU/RSV plus assay is intended as an aid in the diagnosis of influenza from Nasopharyngeal swab specimens and should not be used as a sole basis for treatment. Nasal washings and aspirates are unacceptable for Xpert Xpress SARS-CoV-2/FLU/RSV testing.  Fact Sheet for Patients: BloggerCourse.com  Fact Sheet for Healthcare Providers: SeriousBroker.it  This test is not yet approved or cleared by the [place]  FDA and has been authorized for detection and/or diagnosis of SARS-CoV-2 by FDA under an Emergency Use Authorization (EUA). This EUA will remain in effect (meaning this test can be used) for the duration of the COVID-19 declaration under Section 564(b)(1) of the Act, 21 U.S.C. section 360bbb-3(b)(1), unless the authorization is terminated or revoked.     Resp Syncytial Virus by PCR NEGATIVE NEGATIVE Final    Comment: (NOTE) Fact Sheet for Patients: BloggerCourse.com  Fact Sheet for Healthcare Providers: SeriousBroker.it  This test is not yet approved or cleared by the [place]  FDA and has been authorized for detection and/or diagnosis of SARS-CoV-2 by FDA under an Emergency Use Authorization (EUA). This EUA will remain in effect (meaning this test can be used) for the duration of the COVID-19 declaration under Section 564(b)(1) of  the Act, 21 U.S.C. section 360bbb-3(b)(1), unless the authorization is terminated or revoked.  Performed at [facility], [address] [place], [place] [address]   Blood culture (routine x 2)     Status: None (Preliminary result)   Collection Time: 12/22/23  9:46 AM   Specimen: BLOOD  Result Value Ref Range Status   Specimen Description   Final    BLOOD LEFT ANTECUBITAL Performed at [facility], [address], [place], [place] [address]    Special Requests   Final    BOTTLES DRAWN AEROBIC AND ANAEROBIC Blood Culture results may not be optimal due to an inadequate volume of blood received in culture bottles Performed at [facility], [address], [place], [place] [address]    Culture   Final    NO GROWTH 4 DAYS Performed at [facility], [address], [place], [place] [address]    Report Status PENDING  Incomplete  Respiratory (~20 pathogens) panel by PCR     Status: None   Collection Time: 12/23/23  9:04 AM   Specimen: Nasopharyngeal Swab; Respiratory  Result Value Ref Range Status   Adenovirus NOT DETECTED NOT DETECTED Final   Coronavirus 229E NOT DETECTED NOT DETECTED Final    Comment: (NOTE) The Coronavirus on the Respiratory Panel, DOES NOT test for the novel  Coronavirus (2019 nCoV)    Coronavirus HKU1 NOT DETECTED NOT DETECTED Final   Coronavirus NL63 NOT DETECTED NOT DETECTED Final   Coronavirus OC43 NOT DETECTED NOT DETECTED Final   Metapneumovirus NOT DETECTED NOT DETECTED Final   Rhinovirus / Enterovirus NOT DETECTED NOT DETECTED Final   Influenza A NOT DETECTED NOT DETECTED Final   Influenza B NOT DETECTED NOT DETECTED Final   Parainfluenza Virus 1 NOT DETECTED NOT DETECTED Final   Parainfluenza Virus 2 NOT DETECTED NOT DETECTED Final   Parainfluenza Virus 3 NOT DETECTED NOT DETECTED Final   Parainfluenza Virus 4 NOT DETECTED NOT DETECTED Final   Respiratory Syncytial Virus NOT DETECTED  NOT DETECTED Final  Bordetella pertussis NOT DETECTED NOT DETECTED Final   Bordetella Parapertussis NOT DETECTED NOT DETECTED Final   Chlamydophila pneumoniae NOT DETECTED NOT DETECTED Final   Mycoplasma pneumoniae NOT DETECTED NOT DETECTED Final    Comment: Performed at [facility], [address] [place], [place] [address]     Studies: No results found.     Orean Giarratano, MD  [facility] 12/26/2023  If 7PM-7AM, please contact night-coverage

## 2023-12-27 DIAGNOSIS — J9621 Acute and chronic respiratory failure with hypoxia: Secondary | ICD-10-CM | POA: Diagnosis not present

## 2023-12-27 LAB — CULTURE, BLOOD (ROUTINE X 2)
Culture: NO GROWTH
Culture: NO GROWTH
Special Requests: ADEQUATE

## 2023-12-27 LAB — CBC
HCT: 41.7 % (ref 39.0–52.0)
Hemoglobin: 13 g/dL (ref 13.0–17.0)
MCH: 28.2 pg (ref 26.0–34.0)
MCHC: 31.2 g/dL (ref 30.0–36.0)
MCV: 90.5 fL (ref 80.0–100.0)
Platelets: 219 K/uL (ref 150–400)
RBC: 4.61 MIL/uL (ref 4.22–5.81)
RDW: 14.1 % (ref 11.5–15.5)
WBC: 10.6 K/uL — ABNORMAL HIGH (ref 4.0–10.5)
nRBC: 0 % (ref 0.0–0.2)

## 2023-12-27 LAB — BASIC METABOLIC PANEL WITH GFR
Anion gap: 11 (ref 5–15)
BUN: 38 mg/dL — ABNORMAL HIGH (ref 8–23)
CO2: 28 mmol/L (ref 22–32)
Calcium: 8.7 mg/dL — ABNORMAL LOW (ref 8.9–10.3)
Chloride: 99 mmol/L (ref 98–111)
Creatinine, Ser: 1.2 mg/dL (ref 0.61–1.24)
GFR, Estimated: 56 mL/min — ABNORMAL LOW (ref >60)
Glucose, Bld: 104 mg/dL — ABNORMAL HIGH (ref 70–99)
Potassium: 4.4 mmol/L (ref 3.5–5.1)
Sodium: 137 mmol/L (ref 135–145)

## 2023-12-27 MED ORDER — TRAZODONE HCL 50 MG PO TABS
50.0000 mg | ORAL_TABLET | Freq: Every day | ORAL | Status: DC
  Administered 2023-12-27: 50 mg via ORAL
  Filled 2023-12-27: qty 1

## 2023-12-27 MED ORDER — TRAMADOL HCL 50 MG PO TABS
50.0000 mg | ORAL_TABLET | Freq: Two times a day (BID) | ORAL | Status: DC | PRN
  Administered 2023-12-28: 50 mg via ORAL
  Filled 2023-12-27: qty 1

## 2023-12-27 MED ORDER — IPRATROPIUM-ALBUTEROL 0.5-2.5 (3) MG/3ML IN SOLN
3.0000 mL | Freq: Three times a day (TID) | RESPIRATORY_TRACT | Status: DC
  Administered 2023-12-27 – 2023-12-28 (×4): 3 mL via RESPIRATORY_TRACT
  Filled 2023-12-27 (×5): qty 3

## 2023-12-27 NOTE — Progress Notes (Signed)
   12/27/23 2306  BiPAP/CPAP/SIPAP  BiPAP/CPAP/SIPAP Pt Type Adult  Reason BIPAP/CPAP not in use Non-compliant

## 2023-12-27 NOTE — NC FL2 (Signed)
 [place]  MEDICAID FL2 LEVEL OF CARE FORM     IDENTIFICATION  Patient Name: Samuel Mcconnell Birthdate: 08-06-1927 Sex: male Admission Date (Current Location): 12/22/2023  [facility] and [place] Number:  Producer, television/film/video and Address:  [facility],  [address], [place] [address]      Provider Number: [id number]  Attending Physician Name and Address:  Sonjia Held, MD  Relative Name and Phone Number:  [name] (daughter) Ph: [phone]    Current Level of Care: Hospital Recommended Level of Care: Skilled Nursing Facility Prior Approval Number:    Date Approved/Denied:   PASRR Number: [id number]  Discharge Plan: SNF    Current Diagnoses: Patient Active Problem List   Diagnosis Date Noted   Acute on chronic respiratory failure with hypoxia (HCC) 12/22/2023   DNR (do not resuscitate)/DNI(Do Not Intubate) 12/22/2023   COPD with acute exacerbation (HCC) 12/22/2023   Hypernatremia 12/22/2023   Corn of foot 09/23/2023   Primary osteoarthritis of both knees 08/13/2023   Left elbow pain 07/19/2023   Acute respiratory failure with hypoxia (HCC) 05/09/2023   Rib pain on left side 10/22/2022   Thyroid  nodule 09/09/2022   Chronic diastolic CHF (congestive heart failure) (HCC) 10/25/2021   Dry nares 02/18/2021   Restless legs 11/18/2020   Secondary hypercoagulable state 10/14/2020   Gait instability 11/26/2019   Hypothyroid 11/26/2019   Bilateral lower extremity edema 09/08/2019   Chronic obstructive pulmonary disease (HCC) 09/14/2017   Essential hypertension 09/14/2017   Myringotomy tube status 02/25/2017   LVH (left ventricular hypertrophy) 03/28/2016   Gallbladder calculus without cholecystitis 07/22/2014   Fatty liver 07/22/2014   Persistent atrial fibrillation (HCC) 03/15/2013   CKD stage 3b, GFR 30-44 ml/min (HCC) - baseline Scr 1.7-1.8 02/21/2013   Allergic rhinitis 06/24/2012    Orientation RESPIRATION BLADDER Height & Weight      Self, Place  O2 (4L/min) Incontinent Weight: 175 lb 0.7 oz (79.4 kg) Height:  5' 11.5 (181.6 cm)  BEHAVIORAL SYMPTOMS/MOOD NEUROLOGICAL BOWEL NUTRITION STATUS      Continent Diet (Dysphagia 3 diet)  AMBULATORY STATUS COMMUNICATION OF NEEDS Skin   Extensive Assist Verbally Other (Comment), Skin abrasions (Abrasions: bilateral arms & legs; Ecchymosis: right arm, left leg; Erythema: bilateral arms)                       Personal Care Assistance Level of Assistance  Bathing, Feeding, Dressing Bathing Assistance: Maximum assistance Feeding assistance: Limited assistance Dressing Assistance: Maximum assistance     Functional Limitations Info  Sight, Hearing, Speech Sight Info: Adequate Hearing Info: Impaired Speech Info: Adequate    SPECIAL CARE FACTORS FREQUENCY  PT (By licensed PT), OT (By licensed OT)     PT Frequency: 5x's/week OT Frequency: 5x's/week            Contractures Contractures Info: Not present    Additional Factors Info  Code Status, Allergies, Psychotropic Code Status Info: DNR Allergies Info: Clindamycin/lincomycin, Dabigatran Etexilate Mesylate, Doxycycline  Psychotropic Info: See discharge summary         Current Medications (12/27/2023):  This is the current hospital active medication list Current Facility-Administered Medications  Medication Dose Route Frequency Provider Last Rate Last Admin   acetaminophen  (TYLENOL ) tablet 1,000 mg  1,000 mg Oral Q6H PRN Laurence Locus, DO   1,000 mg at 12/26/23 2103   Or   acetaminophen  (TYLENOL ) suppository 650 mg  650 mg Rectal Q6H PRN Laurence Locus, DO       alum &  mag hydroxide-simeth (MAALOX/MYLANTA) 200-200-20 MG/5ML suspension 30 mL  30 mL Oral Q4H PRN Chavez, Abigail, NP   30 mL at 12/25/23 0017   amiodarone  (PACERONE ) tablet 200 mg  200 mg Oral Daily Laurence Locus, DO   200 mg at 12/27/23 9078   apixaban  (ELIQUIS ) tablet 2.5 mg  2.5 mg Oral Q12H Von Bellis, MD   2.5 mg at 12/27/23 0559   Or   enoxaparin  (LOVENOX) injection 80 mg  1 mg/kg Subcutaneous Q12H Von Bellis, MD       budesonide -glycopyrrolate-formoterol  (BREZTRI ) 160-9-4.8 MCG/ACT inhaler 2 puff  2 puff Inhalation BID Von Bellis, MD   2 puff at 12/27/23 0734   cefTRIAXone  (ROCEPHIN ) 1 g in sodium chloride  0.9 % 100 mL IVPB  1 g Intravenous Q24H Laurence Locus, DO 200 mL/hr at 12/27/23 1140 1 g at 12/27/23 1140   doxycycline  (VIBRAMYCIN ) 100 mg in sodium chloride  0.9 % 250 mL IVPB  100 mg Intravenous Q12H Von Bellis, MD 125 mL/hr at 12/27/23 0919 100 mg at 12/27/23 0919   fluticasone  (FLONASE ) 50 MCG/ACT nasal spray 2 spray  2 spray Each Nare Daily Von Bellis, MD   2 spray at 12/27/23 9077   furosemide  (LASIX ) injection 20 mg  20 mg Intravenous BID Von Bellis, MD   20 mg at 12/27/23 9078   guaiFENesin  (MUCINEX ) 12 hr tablet 600 mg  600 mg Oral BID Von Bellis, MD   600 mg at 12/27/23 9077   guaiFENesin -dextromethorphan (ROBITUSSIN DM) 100-10 MG/5ML syrup 5 mL  5 mL Oral Q4H PRN Von Bellis, MD       haloperidol lactate (HALDOL) injection 2 mg  2 mg Intravenous Q6H PRN Von Bellis, MD       Influenza vac split trivalent PF (FLUZONE HIGH-DOSE) injection 0.5 mL  0.5 mL Intramuscular Tomorrow-1000 Laurence Locus, DO       ipratropium-albuterol  (DUONEB) 0.5-2.5 (3) MG/3ML nebulizer solution 3 mL  3 mL Nebulization Q4H PRN Von Bellis, MD       ipratropium-albuterol  (DUONEB) 0.5-2.5 (3) MG/3ML nebulizer solution 3 mL  3 mL Nebulization TID Pokhrel, Laxman, MD   3 mL at 12/27/23 0733   levothyroxine  (SYNTHROID ) tablet 137 mcg  137 mcg Oral QAC breakfast Laurence Locus, DO   137 mcg at 12/27/23 0559   loperamide (IMODIUM) capsule 2 mg  2 mg Oral PRN Chavez, Abigail, NP   2 mg at 12/23/23 2219   loratadine (CLARITIN) tablet 10 mg  10 mg Oral Daily Von Bellis, MD   10 mg at 12/27/23 9078   ondansetron  (ZOFRAN ) tablet 4 mg  4 mg Oral Q6H PRN Laurence Locus, DO       Or   ondansetron  (ZOFRAN ) injection 4 mg  4 mg Intravenous Q6H PRN Laurence Locus, DO       rOPINIRole  (REQUIP ) tablet 0.25 mg  0.25 mg Oral Daily PRN Von Bellis, MD       rOPINIRole  (REQUIP ) tablet 0.75 mg  0.75 mg Oral QHS Laurence Locus, DO   0.75 mg at 12/26/23 2103     Discharge Medications: Please see discharge summary for a list of discharge medications.  Relevant Imaging Results:  Relevant Lab Results:   Additional Information SSN: [id number]  Duwaine GORMAN Aran, LCSW

## 2023-12-27 NOTE — Progress Notes (Addendum)
 PROGRESS NOTE  Samuel Mcconnell FMW:[id number] DOB: Dec 01, 1927 DOA: 12/22/2023 PCP: Alvia Bring, DO   LOS: 5 days   Brief narrative:   89 year old Caucasian male with a history of COPD, diastolic heart failure, A-fib on Eliquis , recent need for home oxygen, CKD stage IIIb/IV, presented to hospital with cough shortness of breath and hypoxia.  Patient was seen by PCP and was started on home oxygen for hypoxia recently.  Patient complains of progressive shortness of breath for 2 to 3 days.  Currently living at [facility] independent living facility.   Patient was brought into the hospital by EMS and was short of of breath and hypoxic with saturation in the 70s.  Initial vitals were notable for elevated blood pressure.  WBC was elevated at 12.1.  proBNP 1034.  Creatinine elevated at 1.8.  COVID RSV and influenza was negative.  Lactate was 1.6.  Troponin was slightly elevated at 97 followed by 86.  Chest x-ray showed COPD changes with perihilar opacities.  He was given DuoNebs Solu-Medrol  and subsequently was put on BiPAP.  Patient was then admitted hospital for further evaluation and treatment.  Assessment/Plan: Principal Problem:   Acute on chronic respiratory failure with hypoxia (HCC) Active Problems:   COPD with acute exacerbation (HCC)   Acute respiratory failure with hypoxia (HCC)   Hypernatremia   Essential hypertension   CKD stage 3b, GFR 30-44 ml/min (HCC) - baseline Scr 1.7-1.8   Bilateral lower extremity edema   Persistent atrial fibrillation (HCC)   Secondary hypercoagulable state   Chronic diastolic CHF (congestive heart failure) (HCC)   DNR (do not resuscitate)/DNI(Do Not Intubate)   Acute on chronic respiratory failure with hypoxia, possible perihilar pneumonia Patient was initially on BiPAP.  Continue DuoNebs.  Will continue to wean off as possible.  Currently on 4 L of oxygen by nasal cannula.  Blood cultures negative in 5 days.  Respiratory viral panel negative.  Patient  was diagnosed of hypoxia in the office and was supposed to get oxygen which was not delivered.  Continue Rocephin  and doxycycline  to complete course.  Patient will likely need oxygen on discharge.  Dysphagia.  Seen by speech therapy and recommend mechanical soft diet with thin liquids.  Continue aspiration precautions.   COPD with acute exacerbation  On low-dose IV Solu-Medrol .  Patient has not been able to be stopped.  Will discontinue Solu-Medrol .  Continue bronchodilators.  Continue bronchodilators.  Continue to wean oxygen as able.  Insomnia.  Agitation is okay but has not slept well.  Will try dose of trazodone  tonight.  Family states that patient might also do well with tramadol .  When taken off Seroquel since did not help and melatonin knocks him out quite a bit.   Hypernatremia, mild.  Resolved Latest sodium of 137   Chronic diastolic CHF (congestive heart failure)  Currently compensated.  Slightly volume depleted today due to lack of oral intake.  Received hydration.  Currently taking orally.    Secondary hypercoagulable state Secondary to A-fib.  On Eliquis .  Continue.    Persistent atrial fibrillation currently rate controlled Continue amiodarone  and eliquis .    Bilateral lower extremity edema Chronic findings.    CKD stage 3b, GFR 30-44 ml/min (HCC) - baseline Scr 1.7-1.8 Initial serum creatinine Scr 1.81, BUN 41.  Received IV fluids.  Lasix  on hold latest creatinine of 1.2   Hypophosphatemia, Replenished and improved.  Essential hypertension Stable at this time.  Latest blood pressure of 136/86   Restless leg syndrome.  On  requip , tramadol  will add as per    Dementia and sundowning with hospital induced delirium. Improved.  Unable to sleep.  Will try trazodone  tonight.  On as needed Haldol.  # Iron deficiency, Tsat 5% Receiving IV Venofer daily for 3 days.  Hemoglobin stable.  Will consider oral iron supplementation on discharge.  DVT prophylaxis: apixaban   (ELIQUIS ) tablet 2.5 mg Start: 12/24/23 1800 SCDs Start: 12/22/23 1401 apixaban  (ELIQUIS ) tablet 2.5 mg   Disposition: Skilled nursing facility as per PT  Status is: Inpatient Remains inpatient appropriate because: Pending clinical improvement, still on supplemental oxygen, need for rehabitation.    Code Status:     Code Status: Limited: Do not attempt resuscitation (DNR) -DNR-LIMITED -Do Not Intubate/DNI   Family Communication: Spoke with the patient's daughter again today at bedside.  Consultants: None  Procedures: BiPAP  Anti-infectives:  Rocephin  and doxycycline  IV  Anti-infectives (From admission, onward)    Start     Dose/Rate Route Frequency Ordered Stop   12/24/23 1400  doxycycline  (VIBRAMYCIN ) 100 mg in sodium chloride  0.9 % 250 mL IVPB  Status:  Discontinued        100 mg 125 mL/hr over 120 Minutes Intravenous Every 12 hours 12/24/23 1313 12/27/23 1426   12/23/23 1000  cefTRIAXone  (ROCEPHIN ) 1 g in sodium chloride  0.9 % 100 mL IVPB  Status:  Discontinued        1 g 200 mL/hr over 30 Minutes Intravenous Every 24 hours 12/22/23 1953 12/27/23 1426   12/23/23 1000  azithromycin  (ZITHROMAX ) tablet 500 mg  Status:  Discontinued        500 mg Oral Daily 12/22/23 1953 12/24/23 1311   12/22/23 1030  cefTRIAXone  (ROCEPHIN ) 1 g in sodium chloride  0.9 % 100 mL IVPB        1 g 200 mL/hr over 30 Minutes Intravenous  Once 12/22/23 1015 12/22/23 1110   12/22/23 1030  azithromycin  (ZITHROMAX ) 500 mg in sodium chloride  0.9 % 250 mL IVPB        500 mg 250 mL/hr over 60 Minutes Intravenous  Once 12/22/23 1015 12/22/23 1226       Subjective: Today patient was seen and examined.  Patient is sitting up in the chair.  Alert awake and Communicative.  Patient's family concerned about him not sleeping in the nighttime but no mention of agitation.  Still on supplemental oxygen.    Objective: Vitals:   12/27/23 1404 12/27/23 1420  BP:  122/69  Pulse:  63  Resp:  16  Temp:  97.8 F  (36.6 C)  SpO2: 96% 99%    Intake/Output Summary (Last 24 hours) at 12/27/2023 1549 Last data filed at 12/27/2023 1042 Gross per 24 hour  Intake 1513.68 ml  Output 800 ml  Net 713.68 ml   Filed Weights   12/22/23 2030  Weight: 79.4 kg   Body mass index is 24.07 kg/m.   Physical Exam:  GENERAL: Patient is alert awake and Communicative.  cognitive dysfunction from dementia.  Elderly male, on nasal cannula oxygen, HENT: No scleral pallor or icterus. Pupils equally reactive to light. Oral mucosa is moist NECK: is supple, no gross swelling noted. CHEST: Diminished breath sounds bilaterally.  No overt wheezing noted. CVS: S1 and S2 heard, no murmur. Regular rate and rhythm.  ABDOMEN: Soft, non-tender, bowel sounds are present. EXTREMITIES: Bilateral lower extremity edema. CNS: Moves all extremities, no focal cranial nerve defects. SKIN: warm and dry without rashes.  Data Review: I have personally reviewed the following laboratory data  and studies,  CBC: Recent Labs  Lab 12/22/23 0941 12/23/23 0353 12/24/23 0601 12/25/23 0418 12/26/23 0500 12/27/23 0409  WBC 12.1* 8.8 15.2* 13.2* 12.0* 10.6*  NEUTROABS 5.3 7.6  --   --   --   --   HGB 13.4 11.5* 12.7* 12.8* 12.6* 13.0  HCT 43.6 36.9* 40.8 41.3 40.6 41.7  MCV 95.8 93.7 93.6 93.2 93.3 90.5  PLT 282 254 242 236 223 219   Basic Metabolic Panel: Recent Labs  Lab 12/23/23 0353 12/24/23 0601 12/25/23 0418 12/26/23 0500 12/27/23 0409  NA 141 145 142 140 137  K 4.8 4.3 4.5 4.2 4.4  CL 102 102 100 101 99  CO2 27 32 32 30 28  GLUCOSE 130* 105* 107* 98 104*  BUN 35* 40* 42* 40* 38*  CREATININE 1.46* 1.45* 1.34* 1.24 1.20  CALCIUM 9.3 9.0 8.9 8.7* 8.7*  MG 2.4 2.3 2.5* 2.4  --   PHOS 2.4* 3.3 3.8 2.4*  --    Liver Function Tests: Recent Labs  Lab 12/22/23 0941 12/23/23 0353  AST 44* 49*  ALT 17 27  ALKPHOS 65 54  BILITOT 0.4 0.5  PROT 6.9 5.7*  ALBUMIN 4.4 3.6   No results for input(s): LIPASE, AMYLASE in  the last 168 hours. No results for input(s): AMMONIA in the last 168 hours. Cardiac Enzymes: No results for input(s): CKTOTAL, CKMB, CKMBINDEX, TROPONINI in the last 168 hours. BNP (last 3 results) Recent Labs    05/09/23 1259 12/14/23 1216  BNP 130.2* 62.8    ProBNP (last 3 results) Recent Labs    12/22/23 0941  PROBNP 1,034.0*    CBG: No results for input(s): GLUCAP in the last 168 hours. Recent Results (from the past 240 hours)  Blood culture (routine x 2)     Status: None   Collection Time: 12/22/23  9:41 AM   Specimen: BLOOD  Result Value Ref Range Status   Specimen Description   Final    BLOOD RIGHT ANTECUBITAL Performed at [facility], [address], [place], [place] [address]    Special Requests   Final    BOTTLES DRAWN AEROBIC AND ANAEROBIC Blood Culture adequate volume Performed at [facility], [address], [place], [place] [address]    Culture   Final    NO GROWTH 5 DAYS Performed at [facility], [address], [place], [place] [address]    Report Status 12/27/2023 FINAL  Final  Resp panel by RT-PCR (RSV, Flu A&B, Covid)     Status: None   Collection Time: 12/22/23  9:42 AM   Specimen: Nasal Swab  Result Value Ref Range Status   SARS Coronavirus 2 by RT PCR NEGATIVE NEGATIVE Final    Comment: (NOTE) SARS-CoV-2 target nucleic acids are NOT DETECTED.  The SARS-CoV-2 RNA is generally detectable in upper respiratory specimens during the acute phase of infection. The lowest concentration of SARS-CoV-2 viral copies this assay can detect is 138 copies/mL. A negative result does not preclude SARS-Cov-2 infection and should not be used as the sole basis for treatment or other patient management decisions. A negative result may occur with  improper specimen collection/handling, submission of specimen other than nasopharyngeal swab, presence of viral mutation(s) within the areas targeted by this  assay, and inadequate number of viral copies(<138 copies/mL). A negative result must be combined with clinical observations, patient history, and epidemiological information. The expected result is Negative.  Fact Sheet for Patients:  BloggerCourse.com  Fact Sheet for Healthcare Providers:  SeriousBroker.it  This test is no t yet approved or cleared by the [place]  FDA and  has been authorized for detection and/or diagnosis of SARS-CoV-2 by FDA under an Emergency Use Authorization (EUA). This EUA will remain  in effect (meaning this test can be used) for the duration of the COVID-19 declaration under Section 564(b)(1) of the Act, 21 U.S.C.section 360bbb-3(b)(1), unless the authorization is terminated  or revoked sooner.       Influenza A by PCR NEGATIVE NEGATIVE Final   Influenza B by PCR NEGATIVE NEGATIVE Final    Comment: (NOTE) The Xpert Xpress SARS-CoV-2/FLU/RSV plus assay is intended as an aid in the diagnosis of influenza from Nasopharyngeal swab specimens and should not be used as a sole basis for treatment. Nasal washings and aspirates are unacceptable for Xpert Xpress SARS-CoV-2/FLU/RSV testing.  Fact Sheet for Patients: BloggerCourse.com  Fact Sheet for Healthcare Providers: SeriousBroker.it  This test is not yet approved or cleared by the [place]  FDA and has been authorized for detection and/or diagnosis of SARS-CoV-2 by FDA under an Emergency Use Authorization (EUA). This EUA will remain in effect (meaning this test can be used) for the duration of the COVID-19 declaration under Section 564(b)(1) of the Act, 21 U.S.C. section 360bbb-3(b)(1), unless the authorization is terminated or revoked.     Resp Syncytial Virus by PCR NEGATIVE NEGATIVE Final    Comment: (NOTE) Fact Sheet for Patients: BloggerCourse.com  Fact Sheet for  Healthcare Providers: SeriousBroker.it  This test is not yet approved or cleared by the [place]  FDA and has been authorized for detection and/or diagnosis of SARS-CoV-2 by FDA under an Emergency Use Authorization (EUA). This EUA will remain in effect (meaning this test can be used) for the duration of the COVID-19 declaration under Section 564(b)(1) of the Act, 21 U.S.C. section 360bbb-3(b)(1), unless the authorization is terminated or revoked.  Performed at [facility], [address] [place], [place] [address]   Blood culture (routine x 2)     Status: None   Collection Time: 12/22/23  9:46 AM   Specimen: BLOOD  Result Value Ref Range Status   Specimen Description   Final    BLOOD LEFT ANTECUBITAL Performed at [facility], [address], [place], [place] [address]    Special Requests   Final    BOTTLES DRAWN AEROBIC AND ANAEROBIC Blood Culture results may not be optimal due to an inadequate volume of blood received in culture bottles Performed at [facility], [address] [place], [place] [address]    Culture   Final    NO GROWTH 5 DAYS Performed at [facility], [address] [place], [place] [address]    Report Status 12/27/2023 FINAL  Final  Respiratory (~20 pathogens) panel by PCR     Status: None   Collection Time: 12/23/23  9:04 AM   Specimen: Nasopharyngeal Swab; Respiratory  Result Value Ref Range Status   Adenovirus NOT DETECTED NOT DETECTED Final   Coronavirus 229E NOT DETECTED NOT DETECTED Final    Comment: (NOTE) The Coronavirus on the Respiratory Panel, DOES NOT test for the novel  Coronavirus (2019 nCoV)    Coronavirus HKU1 NOT DETECTED NOT DETECTED Final   Coronavirus NL63 NOT DETECTED NOT DETECTED Final   Coronavirus OC43 NOT DETECTED NOT DETECTED Final   Metapneumovirus NOT DETECTED NOT DETECTED Final   Rhinovirus / Enterovirus NOT DETECTED NOT  DETECTED Final  Influenza A NOT DETECTED NOT DETECTED Final   Influenza B NOT DETECTED NOT DETECTED Final   Parainfluenza Virus 1 NOT DETECTED NOT DETECTED Final   Parainfluenza Virus 2 NOT DETECTED NOT DETECTED Final   Parainfluenza Virus 3 NOT DETECTED NOT DETECTED Final   Parainfluenza Virus 4 NOT DETECTED NOT DETECTED Final   Respiratory Syncytial Virus NOT DETECTED NOT DETECTED Final   Bordetella pertussis NOT DETECTED NOT DETECTED Final   Bordetella Parapertussis NOT DETECTED NOT DETECTED Final   Chlamydophila pneumoniae NOT DETECTED NOT DETECTED Final   Mycoplasma pneumoniae NOT DETECTED NOT DETECTED Final    Comment: Performed at [facility], [address] [place], [place] [address]     Studies: No results found.   Jung Ingerson, MD  [facility] 12/27/2023  If 7PM-7AM, please contact night-coverage

## 2023-12-27 NOTE — Plan of Care (Signed)

## 2023-12-27 NOTE — Progress Notes (Signed)
 Physical Therapy Treatment Patient Details Name: Samuel Mcconnell MRN: [id number] DOB: September 11, 1927 Today's Date: 12/27/2023   History of Present Illness 89 year old Caucasian, who presented to the ER 12/22/23  via EMS due to cough, shortness of breath and hypoxia.  Daughter states that he was seen last week by his PCP and started on home oxygen due to hypoxia noted in the ER.  His home oxygen was never delivered.  He has been having progressive shortness of breath over the last 2 to 3 days. Dx of Acute on chronic respiratory failure with hypoxia. Pt with a history of COPD, diastolic heart failure, A-fib on Eliquis , recent need for home oxygen, CKD stage IIIb/IV    PT Comments   Improved cognition/alertness.  Family reports, still not sleeping well at night.  HOH requiring repeat instructions.  Pleasant and motivated. Pt on 4 lts nasal vs 5 lts last session. Assisted OOB was difficult.  General bed mobility comments: Pt was attempting to self sit EOB but required + 1Mod/ Max Assist esp with scooting to EOB due to weakness/fatigue and requiring increased time. General transfer comment: from elevated bed and 75% VC's on proper hand placement to push up from bed vs pull on walker.  Assist for posterior lean.  Increased time to achieve upright posture/balance.  Increased instability with turns to recliner and back steps.  HIGH FALL RISK. General Gait Details: VERY UNSTEADY gait requiring + 2 side by side Max Assist with third assist following with recliner.  Severe posterior lean and decreased weight shift to left.  Steps are short, shuffled with intermittent pattern.  Balance is poor as he presents with a delayed self correction reaction responce.  MAX c/o weakness and fatigue.  Remained on 4 lts nasal with avg sats low 90s.  VC's to take deep breaths resulted in a weak, shallow cough.  Avg HR 89.  Decreased weight shift to L LE bad knee.  HIGH FALL RISK.  Progressing slowly. Prior to admit, Pt was amb IND  with his Rollator and living at [facility] plus NOT on any oxygen. Pt will need ST Rehab at SNF to address mobility and functional decline prior to safely returning home.    If plan is discharge home, recommend the following: Two people to help with walking and/or transfers;Two people to help with bathing/dressing/bathroom;Assistance with cooking/housework;Direct supervision/assist for medications management;Direct supervision/assist for financial management;Assist for transportation;Help with stairs or ramp for entrance   Can travel by private vehicle     No  Equipment Recommendations  None recommended by PT    Recommendations for Other Services       Precautions / Restrictions Precautions Precautions: Fall Precaution/Restrictions Comments: 3-4 falls in past 6 months, monitor sats Restrictions Weight Bearing Restrictions Per Provider Order: No     Mobility  Bed Mobility Overal bed mobility: Needs Assistance Bed Mobility: Supine to Sit     Supine to sit: Mod assist, Max assist     General bed mobility comments: Pt was attempting to self sit EOB but required + 1Mod/ Max Assist esp with scooting to EOB due to weakness/fatigue and requiring increased time.    Transfers Overall transfer level: Needs assistance Equipment used: Rolling walker (2 wheels) Transfers: Sit to/from Stand Sit to Stand: Mod assist, Max assist, +2 physical assistance, +2 safety/equipment, From elevated surface           General transfer comment: from elevated bed and 75% VC's on proper hand placement to push up from bed vs  pull on walker.  Assist for posterior lean.  Increased time to achieve upright posture/balance.  Increased instability with turns to recliner and back steps.  HIGH FALL RISK.    Ambulation/Gait Ambulation/Gait assistance: Min assist, Mod assist, +2 physical assistance, +2 safety/equipment Gait Distance (Feet): 54 Feet Assistive device: Rolling walker (2 wheels) Gait  Pattern/deviations: Step-to pattern, Decreased step length - left, Decreased step length - right, Staggering left, Staggering right, Narrow base of support, Ataxic, Decreased weight shift to left Gait velocity: decreased     General Gait Details: VERY UNSTEADY gait requiring + 2 side by side Max Assist with third assist following with recliner.  Severe posterior lean and decreased weight shift to left.  Steps are short, shuffled with intermittent pattern.  Balance is poor as he presents with a delayed self correction reaction responce.  MAX c/o weakness and fatigue.  Remained on 4 lts nasal with avg sats low 90s.  VC's to take deep breaths resulted in a weak, shallow cough.  Avg HR 89.  Decreased weight shift to L LE bad knee.  HIGH FALL RISK.  Progressing slowly.   Stairs             Wheelchair Mobility     Tilt Bed    Modified Rankin (Stroke Patients Only)       Balance                                            Communication Communication Factors Affecting Communication: Hearing impaired  Cognition Arousal: Alert Behavior During Therapy: WFL for tasks assessed/performed   PT - Cognitive impairments: Safety/Judgement, Problem solving                       PT - Cognition Comments: Improved cognition/alertness.  Family reports, still not sleeping well at night.  HOH requiring repeat instructions.  Pleasant and motivated. Following commands: Intact Following commands impaired: Follows one step commands with increased time    Cueing Cueing Techniques: Verbal cues  Exercises      General Comments        Pertinent Vitals/Pain      Home Living                          Prior Function            PT Goals (current goals can now be found in the care plan section) Progress towards PT goals: Progressing toward goals    Frequency    Min 2X/week      PT Plan      Co-evaluation              AM-PAC PT 6  Clicks Mobility   Outcome Measure  Help needed turning from your back to your side while in a flat bed without using bedrails?: A Lot Help needed moving from lying on your back to sitting on the side of a flat bed without using bedrails?: A Lot Help needed moving to and from a bed to a chair (including a wheelchair)?: A Lot Help needed standing up from a chair using your arms (e.g., wheelchair or bedside chair)?: A Lot Help needed to walk in hospital room?: A Lot Help needed climbing 3-5 steps with a railing? : Total 6 Click Score: 11    End of Session Equipment  Utilized During Treatment: Gait belt Activity Tolerance: Patient limited by fatigue Patient left: in chair;with call bell/phone within reach;with family/visitor present Nurse Communication: Mobility status PT Visit Diagnosis: Difficulty in walking, not elsewhere classified (R26.2);History of falling (Z91.81)     Time: 8394-8369 PT Time Calculation (min) (ACUTE ONLY): 25 min  Charges:    $Gait Training: 8-22 mins $Therapeutic Activity: 8-22 mins PT General Charges $$ ACUTE PT VISIT: 1 Visit                     Katheryn Leap  PTA Acute  Rehabilitation Services Office M-F          [phone]

## 2023-12-27 NOTE — TOC Progression Note (Signed)
 Transition of Care [facility]) - Progression Note   Patient Details  Name: Samuel Mcconnell MRN: [id number] Date of Birth: 04/02/1927  [facility] CM/SW Contact  Duwaine GORMAN Aran, LCSW Phone Number: 12/27/2023, 11:48 AM  Clinical Narrative: CSW spoke with daughter regarding SNF. Daughter agreeable to rehab and requested that the referral at least be sent to [facility] and [facility] as she has previous experience with both facilities. FL2 done; PASRR received. Initial referral faxed out. Care management awaiting bed offers.  Expected Discharge Plan: Skilled Nursing Facility Barriers to Discharge: English as a second language teacher, SNF Pending bed offer  Expected Discharge Plan and Services In-house Referral: Clinical Social Work Post Acute Care Choice: Skilled Nursing Facility Living arrangements for the past 2 months: Independent Living Facility         DME Arranged: N/A DME Agency: NA  Social Drivers of Health (SDOH) Interventions SDOH Screenings   Food Insecurity: No Food Insecurity (12/22/2023)  Housing: Low Risk  (12/22/2023)  Transportation Needs: No Transportation Needs (12/22/2023)  Utilities: Not At Risk (12/22/2023)  Alcohol Screen: Low Risk  (11/30/2023)  Depression (PHQ2-9): Low Risk  (11/30/2023)  Financial Resource Strain: Low Risk  (11/30/2023)  Physical Activity: Insufficiently Active (11/30/2023)  Social Connections: Moderately Integrated (12/22/2023)  Stress: No Stress Concern Present (11/30/2023)  Tobacco Use: Medium Risk (12/23/2023)  Health Literacy: Adequate Health Literacy (11/30/2023)   Readmission Risk Interventions    12/23/2023    1:56 PM 05/11/2023   10:47 AM  Readmission Risk Prevention Plan  Post Dischage Appt  Complete  Medication Screening  Complete  Transportation Screening Complete Complete  HRI or Home Care Consult Complete   Social Work Consult for Recovery Care Planning/Counseling Complete   Palliative Care Screening Not Applicable   Medication Review Furniture conservator/restorer) Complete

## 2023-12-28 DIAGNOSIS — R2689 Other abnormalities of gait and mobility: Secondary | ICD-10-CM | POA: Diagnosis not present

## 2023-12-28 DIAGNOSIS — Z7901 Long term (current) use of anticoagulants: Secondary | ICD-10-CM | POA: Diagnosis not present

## 2023-12-28 DIAGNOSIS — I132 Hypertensive heart and chronic kidney disease with heart failure and with stage 5 chronic kidney disease, or end stage renal disease: Secondary | ICD-10-CM | POA: Diagnosis not present

## 2023-12-28 DIAGNOSIS — I5032 Chronic diastolic (congestive) heart failure: Secondary | ICD-10-CM | POA: Diagnosis not present

## 2023-12-28 DIAGNOSIS — J449 Chronic obstructive pulmonary disease, unspecified: Secondary | ICD-10-CM | POA: Diagnosis not present

## 2023-12-28 DIAGNOSIS — R41841 Cognitive communication deficit: Secondary | ICD-10-CM | POA: Diagnosis not present

## 2023-12-28 DIAGNOSIS — Z23 Encounter for immunization: Secondary | ICD-10-CM | POA: Diagnosis present

## 2023-12-28 DIAGNOSIS — E870 Hyperosmolality and hypernatremia: Secondary | ICD-10-CM | POA: Diagnosis not present

## 2023-12-28 DIAGNOSIS — R1312 Dysphagia, oropharyngeal phase: Secondary | ICD-10-CM | POA: Diagnosis not present

## 2023-12-28 DIAGNOSIS — I509 Heart failure, unspecified: Secondary | ICD-10-CM | POA: Diagnosis not present

## 2023-12-28 DIAGNOSIS — J441 Chronic obstructive pulmonary disease with (acute) exacerbation: Secondary | ICD-10-CM | POA: Diagnosis not present

## 2023-12-28 DIAGNOSIS — R2681 Unsteadiness on feet: Secondary | ICD-10-CM | POA: Diagnosis not present

## 2023-12-28 DIAGNOSIS — I4819 Other persistent atrial fibrillation: Secondary | ICD-10-CM | POA: Diagnosis not present

## 2023-12-28 DIAGNOSIS — I110 Hypertensive heart disease with heart failure: Secondary | ICD-10-CM | POA: Diagnosis not present

## 2023-12-28 DIAGNOSIS — J9621 Acute and chronic respiratory failure with hypoxia: Secondary | ICD-10-CM | POA: Diagnosis not present

## 2023-12-28 DIAGNOSIS — R0902 Hypoxemia: Secondary | ICD-10-CM | POA: Diagnosis not present

## 2023-12-28 DIAGNOSIS — J9611 Chronic respiratory failure with hypoxia: Secondary | ICD-10-CM | POA: Diagnosis not present

## 2023-12-28 DIAGNOSIS — G2581 Restless legs syndrome: Secondary | ICD-10-CM | POA: Diagnosis not present

## 2023-12-28 DIAGNOSIS — M6281 Muscle weakness (generalized): Secondary | ICD-10-CM | POA: Diagnosis not present

## 2023-12-28 DIAGNOSIS — J309 Allergic rhinitis, unspecified: Secondary | ICD-10-CM | POA: Diagnosis not present

## 2023-12-28 DIAGNOSIS — N1832 Chronic kidney disease, stage 3b: Secondary | ICD-10-CM | POA: Diagnosis not present

## 2023-12-28 DIAGNOSIS — J440 Chronic obstructive pulmonary disease with acute lower respiratory infection: Secondary | ICD-10-CM | POA: Diagnosis not present

## 2023-12-28 DIAGNOSIS — Z7401 Bed confinement status: Secondary | ICD-10-CM | POA: Diagnosis not present

## 2023-12-28 MED ORDER — ROPINIROLE HCL 0.25 MG PO TABS: 0.2500 mg | ORAL_TABLET | ORAL | Status: DC

## 2023-12-28 MED ORDER — FERROUS GLUCONATE 240 (27 FE) MG PO TABS: 240.0000 mg | ORAL_TABLET | Freq: Two times a day (BID) | ORAL | Status: DC

## 2023-12-28 MED ORDER — TRAMADOL HCL 50 MG PO TABS: 25.0000 mg | ORAL_TABLET | Freq: Three times a day (TID) | ORAL | 0 refills | Status: AC | PRN

## 2023-12-28 MED ORDER — GUAIFENESIN-DM 100-10 MG/5ML PO SYRP: 5.0000 mL | ORAL_SOLUTION | ORAL | Status: AC | PRN

## 2023-12-28 NOTE — Telephone Encounter (Signed)
 I checked in on him at [facility] today

## 2023-12-28 NOTE — TOC Transition Note (Signed)
 Transition of Care [facility]) - Discharge Note  Patient Details  Name: Samuel Mcconnell MRN: [id number] Date of Birth: [birth year]  Transition [facility]) CM/SW Contact:  Duwaine GORMAN Aran, LCSW Phone Number: 12/28/2023, 11:45 AM  Clinical Narrative: CSW provided daughter with list of bed offers with Medicare star ratings. Patient received the following bed offers:  [facility] [address] [place], [place] [address] [phone] Overall rating ??  [facility] [address] [place], [place] [address] [phone] Overall rating ?????  [facility] [address] [place], [place] [address] [phone] Overall rating ?  [facility] and [facility] [address] [place], [place] [address] [phone] Overall rating ?  Daughter chose [facility] and [address]. CSW confirmed bed with admissions at [facility] and patient can be admitted today. CSW completed insurance authorization on [employer] portal. Reference ID# is: [id number]. Patient is approved for 12/28/2023-12/30/2023.  Patient will go to room 905 and the number for report is [phone]. Discharge summary, discharge orders, and SNF transfer report faxed to facility in hub. Medical necessity form done; PTAR scheduled. Discharge packet completed. RN updated. Care management signing off.  Final next level of care: Skilled Nursing Facility Barriers to Discharge: Barriers Resolved  Patient Goals and CMS Choice Patient states their goals for this hospitalization and ongoing recovery are:: Rehab CMS Medicare.gov Compare Post Acute Care list provided to:: Patient Represenative (must comment) Choice offered to / list presented to : Adult Children  Discharge Placement PASRR number recieved: 12/27/23    Patient chooses bed at: [facility] Patient to be transferred to facility by: PTAR Name of family member notified: [name] (daughter) Patient and  family notified of of transfer: 12/28/23  Discharge Plan and Services Additional resources added to the After Visit Summary for   In-house Referral: Clinical Social Work Post Acute Care Choice: Skilled Nursing Facility          DME Arranged: N/A DME Agency: NA  Social Drivers of Health (SDOH) Interventions SDOH Screenings   Food Insecurity: No Food Insecurity (12/22/2023)  Housing: Low Risk  (12/22/2023)  Transportation Needs: No Transportation Needs (12/22/2023)  Utilities: Not At Risk (12/22/2023)  Alcohol Screen: Low Risk  (11/30/2023)  Depression (PHQ2-9): Low Risk  (11/30/2023)  Financial Resource Strain: Low Risk  (11/30/2023)  Physical Activity: Insufficiently Active (11/30/2023)  Social Connections: Moderately Integrated (12/22/2023)  Stress: No Stress Concern Present (11/30/2023)  Tobacco Use: Medium Risk (12/23/2023)  Health Literacy: Adequate Health Literacy (11/30/2023)   Readmission Risk Interventions    12/23/2023    1:56 PM 05/11/2023   10:47 AM  Readmission Risk Prevention Plan  Post Dischage Appt  Complete  Medication Screening  Complete  Transportation Screening Complete Complete  HRI or Home Care Consult Complete   Social Work Consult for Recovery Care Planning/Counseling Complete   Palliative Care Screening Not Applicable   Medication Review Oceanographer) Complete

## 2023-12-28 NOTE — Plan of Care (Signed)
  Problem: Education: Goal: Knowledge of General Education information will improve Description: Including pain rating scale, medication(s)/side effects and non-pharmacologic comfort measures 12/28/2023 0427 by Waneta Marylynn SAUNDERS, RN Outcome: Progressing 12/28/2023 0411 by Waneta Marylynn SAUNDERS, RN Outcome: Progressing   Problem: Health Behavior/Discharge Planning: Goal: Ability to manage health-related needs will improve 12/28/2023 0427 by Waneta Marylynn SAUNDERS, RN Outcome: Progressing 12/28/2023 0411 by Waneta Marylynn SAUNDERS, RN Outcome: Progressing   Problem: Clinical Measurements: Goal: Ability to maintain clinical measurements within normal limits will improve 12/28/2023 0427 by Waneta Marylynn SAUNDERS, RN Outcome: Progressing 12/28/2023 0411 by Waneta Marylynn SAUNDERS, RN Outcome: Progressing

## 2023-12-28 NOTE — Plan of Care (Signed)

## 2023-12-28 NOTE — Discharge Summary (Addendum)
 Physician Discharge Summary  Onaje Warne FMW:[id number] DOB: [birth year] DOA: 12/22/2023  PCP: Alvia Bring, DO  Admit date: 12/22/2023 Discharge date: 12/28/2023  Admitted From: Independent living facility  Discharge disposition: Skilled nursing facility   Recommendations for Outpatient Follow-Up:   Follow up with your primary care provider in one week.  Check CBC, BMP, magnesium in the next visit Can wean off supplemental oxygen if able.  Take aspiration precautions.  Currently on mechanical soft diet with precautions.   Discharge Diagnosis:   Principal Problem:   Acute on chronic respiratory failure with hypoxia (HCC) Active Problems:   COPD with acute exacerbation (HCC)   Acute respiratory failure with hypoxia (HCC)   Hypernatremia   Essential hypertension   CKD stage 3b, GFR 30-44 ml/min (HCC) - baseline Scr 1.7-1.8   Bilateral lower extremity edema   Persistent atrial fibrillation (HCC)   Secondary hypercoagulable state   Chronic diastolic CHF (congestive heart failure) (HCC)   DNR (do not resuscitate)/DNI(Do Not Intubate)    Discharge Condition: Improved.  Diet recommendation: Mechanical soft diet  Wound care: None.  Code status: DNR   History of Present Illness:   89 year old Caucasian male with a history of COPD, diastolic heart failure, A-fib on Eliquis , recent need for home oxygen, CKD stage IIIb/IV, presented to hospital with cough shortness of breath and hypoxia. Patient was seen by PCP and was started on home oxygen for hypoxia recently. Patient complains of progressive shortness of breath for 2 to 3 days. Currently living at [facility] independent living facility. Patient was brought into the hospital by EMS and was short of of breath and hypoxic with saturation in the 70s. Initial vitals were notable for elevated blood pressure. WBC was elevated at 12.1. proBNP 1034. Creatinine elevated at 1.8. COVID RSV and influenza was negative. Lactate was  1.6. Troponin was slightly elevated at 97 followed by 86. Chest x-ray showed COPD changes with perihilar opacities. He was given DuoNebs Solu-Medrol  and subsequently was put on BiPAP. Patient was then admitted hospital for further evaluation and treatment.   Hospital Course:   Following conditions were addressed during hospitalization as listed below,  Acute on chronic respiratory failure with hypoxia, possible perihilar pneumonia Patient was initially on BiPAP.  The hospitalization patient received DuoNebs and supplemental oxygen.  At this time patient has overall clinically improved.  Blood cultures been negative.  Respiratory viral panel was negative as well.  Patient has completed course of antibiotic.  Will continue oxygen on discharge.  Could be weaned as able as outpatient    Dysphagia.  Seen by speech therapy and recommend mechanical soft diet with thin liquids.  Continue aspiration precautions.   COPD with acute exacerbation  Received IV Solu-Medrol  during hospitalization..  Will continue oxygen and bronchodilators on discharge  Insomnia.  Tylenol  PM prior to coming to the hospital.  Will resume on discharge.   Hypernatremia, mild.  Resolved. Latest sodium of 137   Chronic diastolic CHF (congestive heart failure)  Remained compensated.  Resume Lasix  on discharge  Secondary hypercoagulable state Secondary to A-fib.  On Eliquis .  Continue.    Persistent atrial fibrillation currently rate controlled Continue amiodarone  and eliquis .    Mild AKI on CKD stage 3b, GFR 30-44 ml/min - Initial serum creatinine Scr 1.81, BUN 41.  Received IV fluids.  Latest creatinine of 1.2.  Will be resumed on Lasix  on discharge.   Hypophosphatemia, Replenished and improved.   Essential hypertension Stable at this time.     Restless  leg syndrome.  On requip , tramadol .  Will be resumed on discharge.   Dementia and sundowning with hospital induced delirium. Significantly improved.   Iron  deficiency, Tsat 5% Received Venofer for 3 days.  Will continue with iron on discharge.  Disposition.  At this time, patient is stable for disposition to skilled nursing facility  Medical Consultants:   None.  Procedures:    BiPAP Subjective:   Today, patient was seen and examined at bedside.  Feels okay.  Sitting at bedside.  Had some sleep.  Family at bedside.  Discharge Exam:   Vitals:   12/28/23 0420 12/28/23 0748  BP: 133/88   Pulse: 77   Resp: 15   Temp: 98.7 F (37.1 C)   SpO2: 99% 100%   Vitals:   12/27/23 1420 12/27/23 1950 12/28/23 0420 12/28/23 0748  BP: 122/69 114/67 133/88   Pulse: 63 75 77   Resp: 16 17 15    Temp: 97.8 F (36.6 C) 98.5 F (36.9 C) 98.7 F (37.1 C)   TempSrc: Oral     SpO2: 99% 97% 99% 100%  Weight:      Height:        General: Alert awake, not in obvious distress, on nasal cannula oxygen, cognitive dysfunction from dementia. HENT: pupils equally reacting to light,  No scleral pallor or icterus noted. Oral mucosa is moist.  Chest: Decreased breath sounds bilaterally.  Coarse breath sounds noted. CVS: S1 &S2 heard. No murmur.  Regular rate and rhythm. Abdomen: Soft, nontender, nondistended.  Bowel sounds are heard.   Extremities: No cyanosis, clubbing with trace edema.  Peripheral pulses are palpable. Psych: Alert, awake and oriented, normal mood CNS:  No cranial nerve deficits.  Power equal in all extremities.   Skin: Warm and dry.  No rashes noted.  The results of significant diagnostics from this hospitalization (including imaging, microbiology, ancillary and laboratory) are listed below for reference.     Diagnostic Studies:   CT CHEST WO CONTRAST Result Date: 12/22/2023 CLINICAL DATA:  Pneumonia, complication suspected, xray done EXAM: CT CHEST WITHOUT CONTRAST TECHNIQUE: Multidetector CT imaging of the chest was performed following the standard protocol without IV contrast. RADIATION DOSE REDUCTION: This exam was performed  according to the departmental dose-optimization program which includes automated exposure control, adjustment of the mA and/or kV according to patient size and/or use of iterative reconstruction technique. COMPARISON:  CT angiography chest from 07/02/2021. FINDINGS: Cardiovascular: Normal cardiac size. No pericardial effusion. No aortic aneurysm. There are coronary artery calcifications, in keeping with coronary artery disease. There are also moderate peripheral atherosclerotic vascular calcifications of thoracic aorta and its major branches. Mediastinum/Nodes: Small/atrophic thyroid  gland, appears more atrophic in comparison the prior study. No solid / cystic mediastinal masses. The esophagus is nondistended precluding optimal assessment. There are few mildly prominent mediastinal lymph nodes, which do not meet the size criteria for lymphadenopathy and appear grossly similar to the prior study, favoring benign etiology. No axillary lymphadenopathy by size criteria. Evaluation of bilateral hila is limited due to lack on intravenous contrast: however, no large hilar lymphadenopathy identified. Lungs/Pleura: The central tracheo-bronchial tree is patent. Mild upper lobe predominant centrilobular emphysematous changes noted. Redemonstration of nodular partially calcified pleural thickening along the anterior aspect of the left upper lobe, similar to the prior study. No new mass or consolidation. No pleural effusion or pneumothorax. No suspicious lung nodules. Upper Abdomen: There is diffuse hypoattenuation of the liver, which is nonspecific but can be seen with medication such as amiodarone .  There is a single peripherally rim calcified gallstone without imaging signs of acute cholecystitis. 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 There is air in the nondilated main pancreatic duct in the head, which is nonspecific but can be seen with sphincterotomy. Remaining visualized upper abdominal  viscera within normal limits. Musculoskeletal: The visualized soft tissues of the chest wall are grossly unremarkable. No suspicious osseous lesions. There are mild to moderate multilevel degenerative changes in the visualized spine. IMPRESSION: 1. No acute lung disease. No lung mass, consolidation, pleural effusion or pneumothorax. 2. Multiple other nonacute observations, as described above. Aortic Atherosclerosis (ICD10-I70.0) and Emphysema (ICD10-J43.9). Electronically Signed   By: Ree Molt M.D.   On: 12/22/2023 13:59   DG Chest Portable 1 View Result Date: 12/22/2023 CLINICAL DATA:  89 year old male with shortness of breath. EXAM: PORTABLE CHEST 1 VIEW COMPARISON:  Chest radiographs 12/14/2023 and earlier. FINDINGS: Portable AP semi upright view at 0950 hours. Lung volumes not significantly changed. Stable cardiac size and mediastinal contours. Calcified aortic atherosclerosis. Visualized tracheal air column is within normal limits. Increased bilateral patchy and indistinct perihilar lung opacity more apparent on the right. No pneumothorax. No pulmonary edema. No pleural effusion or consolidation identified. Underlying emphysema demonstrated by CT in 2023. Stable visualized osseous structures.  Paucity of bowel gas. IMPRESSION: Emphysema (ICD10-J43.9). Patchy new or increased left > right perihilar opacity since 12/14/2023 suspicious for acute infectious exacerbation. No consolidation or pleural effusion evident. Electronically Signed   By: VEAR Hurst M.D.   On: 12/22/2023 09:58     Labs:   Basic Metabolic Panel: Recent Labs  Lab 12/23/23 0353 12/24/23 0601 12/25/23 0418 12/26/23 0500 12/27/23 0409  NA 141 145 142 140 137  K 4.8 4.3 4.5 4.2 4.4  CL 102 102 100 101 99  CO2 27 32 32 30 28  GLUCOSE 130* 105* 107* 98 104*  BUN 35* 40* 42* 40* 38*  CREATININE 1.46* 1.45* 1.34* 1.24 1.20  CALCIUM 9.3 9.0 8.9 8.7* 8.7*  MG 2.4 2.3 2.5* 2.4  --   PHOS 2.4* 3.3 3.8 2.4*  --     GFR Estimated Creatinine Clearance: 40.7 mL/min (by C-G formula based on SCr of 1.2 mg/dL). Liver Function Tests: Recent Labs  Lab 12/22/23 0941 12/23/23 0353  AST 44* 49*  ALT 17 27  ALKPHOS 65 54  BILITOT 0.4 0.5  PROT 6.9 5.7*  ALBUMIN 4.4 3.6   No results for input(s): LIPASE, AMYLASE in the last 168 hours. No results for input(s): AMMONIA in the last 168 hours. Coagulation profile No results for input(s): INR, PROTIME in the last 168 hours.  CBC: Recent Labs  Lab 12/22/23 0941 12/23/23 0353 12/24/23 0601 12/25/23 0418 12/26/23 0500 12/27/23 0409  WBC 12.1* 8.8 15.2* 13.2* 12.0* 10.6*  NEUTROABS 5.3 7.6  --   --   --   --   HGB 13.4 11.5* 12.7* 12.8* 12.6* 13.0  HCT 43.6 36.9* 40.8 41.3 40.6 41.7  MCV 95.8 93.7 93.6 93.2 93.3 90.5  PLT 282 254 242 236 223 219   Cardiac Enzymes: No results for input(s): CKTOTAL, CKMB, CKMBINDEX, TROPONINI in the last 168 hours. BNP: Invalid input(s): POCBNP CBG: No results for input(s): GLUCAP in the last 168 hours. D-Dimer No results for input(s): DDIMER in the last 72 hours. Hgb A1c No results for input(s): HGBA1C in the last 72 hours. Lipid Profile No results for input(s): CHOL, HDL, LDLCALC, TRIG, CHOLHDL, LDLDIRECT in the last 72 hours. Thyroid   function studies No results for input(s): TSH, T4TOTAL, T3FREE, THYROIDAB in the last 72 hours.  Invalid input(s): FREET3 Anemia work up No results for input(s): VITAMINB12, FOLATE, FERRITIN, TIBC, IRON, RETICCTPCT in the last 72 hours. Microbiology Recent Results (from the past 240 hours)  Blood culture (routine x 2)     Status: None   Collection Time: 12/22/23  9:41 AM   Specimen: BLOOD  Result Value Ref Range Status   Specimen Description   Final    BLOOD RIGHT ANTECUBITAL Performed at [facility], [address] [place], [place] [address]    Special Requests   Final    BOTTLES DRAWN  AEROBIC AND ANAEROBIC Blood Culture adequate volume Performed at [facility], [address], [place], [place] [address]    Culture   Final    NO GROWTH 5 DAYS Performed at [facility], [address], [place], [place] [address]    Report Status 12/27/2023 FINAL  Final  Resp panel by RT-PCR (RSV, Flu A&B, Covid)     Status: None   Collection Time: 12/22/23  9:42 AM   Specimen: Nasal Swab  Result Value Ref Range Status   SARS Coronavirus 2 by RT PCR NEGATIVE NEGATIVE Final    Comment: (NOTE) SARS-CoV-2 target nucleic acids are NOT DETECTED.  The SARS-CoV-2 RNA is generally detectable in upper respiratory specimens during the acute phase of infection. The lowest concentration of SARS-CoV-2 viral copies this assay can detect is 138 copies/mL. A negative result does not preclude SARS-Cov-2 infection and should not be used as the sole basis for treatment or other patient management decisions. A negative result may occur with  improper specimen collection/handling, submission of specimen other than nasopharyngeal swab, presence of viral mutation(s) within the areas targeted by this assay, and inadequate number of viral copies(<138 copies/mL). A negative result must be combined with clinical observations, patient history, and epidemiological information. The expected result is Negative.  Fact Sheet for Patients:  BloggerCourse.com  Fact Sheet for Healthcare Providers:  SeriousBroker.it  This test is no t yet approved or cleared by the [place]  FDA and  has been authorized for detection and/or diagnosis of SARS-CoV-2 by FDA under an Emergency Use Authorization (EUA). This EUA will remain  in effect (meaning this test can be used) for the duration of the COVID-19 declaration under Section 564(b)(1) of the Act, 21 U.S.C.section 360bbb-3(b)(1), unless the authorization is terminated  or revoked sooner.        Influenza A by PCR NEGATIVE NEGATIVE Final   Influenza B by PCR NEGATIVE NEGATIVE Final    Comment: (NOTE) The Xpert Xpress SARS-CoV-2/FLU/RSV plus assay is intended as an aid in the diagnosis of influenza from Nasopharyngeal swab specimens and should not be used as a sole basis for treatment. Nasal washings and aspirates are unacceptable for Xpert Xpress SARS-CoV-2/FLU/RSV testing.  Fact Sheet for Patients: BloggerCourse.com  Fact Sheet for Healthcare Providers: SeriousBroker.it  This test is not yet approved or cleared by the [place]  FDA and has been authorized for detection and/or diagnosis of SARS-CoV-2 by FDA under an Emergency Use Authorization (EUA). This EUA will remain in effect (meaning this test can be used) for the duration of the COVID-19 declaration under Section 564(b)(1) of the Act, 21 U.S.C. section 360bbb-3(b)(1), unless the authorization is terminated or revoked.     Resp Syncytial Virus by PCR NEGATIVE NEGATIVE Final    Comment: (NOTE) Fact Sheet for Patients: BloggerCourse.com  Fact Sheet  for Healthcare Providers: SeriousBroker.it  This test is not yet approved or cleared by the [place]  FDA and has been authorized for detection and/or diagnosis of SARS-CoV-2 by FDA under an Emergency Use Authorization (EUA). This EUA will remain in effect (meaning this test can be used) for the duration of the COVID-19 declaration under Section 564(b)(1) of the Act, 21 U.S.C. section 360bbb-3(b)(1), unless the authorization is terminated or revoked.  Performed at [facility], [address] [place], [place] [address]   Blood culture (routine x 2)     Status: None   Collection Time: 12/22/23  9:46 AM   Specimen: BLOOD  Result Value Ref Range Status   Specimen Description   Final    BLOOD LEFT ANTECUBITAL Performed at [facility], [address] [place], [place] [address]    Special Requests   Final    BOTTLES DRAWN AEROBIC AND ANAEROBIC Blood Culture results may not be optimal due to an inadequate volume of blood received in culture bottles Performed at [facility], [address] [place], [place] [address]    Culture   Final    NO GROWTH 5 DAYS Performed at [facility], [address] [place], [place] [address]    Report Status 12/27/2023 FINAL  Final  Respiratory (~20 pathogens) panel by PCR     Status: None   Collection Time: 12/23/23  9:04 AM   Specimen: Nasopharyngeal Swab; Respiratory  Result Value Ref Range Status   Adenovirus NOT DETECTED NOT DETECTED Final   Coronavirus 229E NOT DETECTED NOT DETECTED Final    Comment: (NOTE) The Coronavirus on the Respiratory Panel, DOES NOT test for the novel  Coronavirus (2019 nCoV)    Coronavirus HKU1 NOT DETECTED NOT DETECTED Final   Coronavirus NL63 NOT DETECTED NOT DETECTED Final   Coronavirus OC43 NOT DETECTED NOT DETECTED Final   Metapneumovirus NOT DETECTED NOT DETECTED Final   Rhinovirus / Enterovirus NOT DETECTED NOT DETECTED Final   Influenza A NOT DETECTED NOT DETECTED Final   Influenza B NOT DETECTED NOT DETECTED Final   Parainfluenza Virus 1 NOT DETECTED NOT DETECTED Final   Parainfluenza Virus 2 NOT DETECTED NOT DETECTED Final   Parainfluenza Virus 3 NOT DETECTED NOT DETECTED Final   Parainfluenza Virus 4 NOT DETECTED NOT DETECTED Final   Respiratory Syncytial Virus NOT DETECTED NOT DETECTED Final   Bordetella pertussis NOT DETECTED NOT DETECTED Final   Bordetella Parapertussis NOT DETECTED NOT DETECTED Final   Chlamydophila pneumoniae NOT DETECTED NOT DETECTED Final   Mycoplasma pneumoniae NOT DETECTED NOT DETECTED Final    Comment: Performed at [facility], [address], [place], [place] [address]     Discharge Instructions:   Discharge Instructions     Call MD for:  severe  uncontrolled pain   Complete by: As directed    Call MD for:  temperature >100.4   Complete by: As directed    Diet general   Complete by: As directed    Mechanical soft diet with full supervision (staff or family) Cup sips only (no straws) Tuck chin to chest when swallowing liquids Meds whole or crushed in puree   Discharge instructions   Complete by: As directed    Follow-up with your primary care provider at the skilled nursing facility in 3 to 5 days.  Check blood work at that time.  Continue oxygen at the facility.  Seek medical attention for worsening symptoms.  Increase activity slowly   Complete by: As directed       Allergies as of 12/28/2023       Reactions   Clindamycin/lincomycin Other (See Comments)   Patient felt weird   Dabigatran Etexilate Mesylate Other (See Comments)   felt weird (name brand Pradaxa)   Doxycycline  Other (See Comments)   Dyspepsia        Medication List     STOP taking these medications    fluticasone -salmeterol 250-50 MCG/ACT Aepb Commonly known as: Advair Diskus   furosemide  40 MG tablet Commonly known as: LASIX        TAKE these medications    albuterol  108 (90 Base) MCG/ACT inhaler Commonly known as: VENTOLIN  HFA Inhale 2 puffs into the lungs every 6 (six) hours as needed for wheezing or shortness of breath.   Allegra Allergy 180 MG tablet Generic drug: fexofenadine Take 180 mg by mouth at bedtime.   AMBULATORY NON FORMULARY MEDICATION Please provide toilet riser/elevator with arms.  Diagnosis: Generalized weakness R53.1   AMBULATORY NON FORMULARY MEDICATION Please provide home oxygen concentrator.  Flow 2 LPM O2 on RA at rest-89% O2 on 2LPM O2 at rest- 94%   amiodarone  200 MG tablet Commonly known as: PACERONE  Take 1 tablet (200 mg total) by mouth daily.   apixaban  2.5 MG Tabs tablet Commonly known as: Eliquis  Take 1 tablet (2.5 mg total) by mouth 2 (two) times daily.   ascorbic acid 500 MG  tablet Commonly known as: VITAMIN C Take 500-1,000 mg by mouth in the morning.   Breztri  Aerosphere 160-9-4.8 MCG/ACT Aero inhaler Generic drug: budesonide -glycopyrrolate-formoterol  INHALE 2 PUFFS INTO THE LUNGS 2 TIMES DAILY   diphenhydramine -acetaminophen  25-500 MG Tabs tablet Commonly known as: TYLENOL  PM Take 1 tablet by mouth at bedtime as needed (for sleeplessness- if no Tylenol  650 mg tablets have been taken).   ferrous gluconate 240 (27 FE) MG tablet Commonly known as: FERGON Take 1 tablet (240 mg total) by mouth 2 (two) times daily before a meal.   fluticasone  50 MCG/ACT nasal spray Commonly known as: FLONASE  Place 2 sprays into both nostrils daily as needed for allergies or rhinitis. What changed: when to take this   guaiFENesin -dextromethorphan 100-10 MG/5ML syrup Commonly known as: ROBITUSSIN DM Take 5 mLs by mouth every 4 (four) hours as needed for up to 5 days for cough.   ipratropium-albuterol  0.5-2.5 (3) MG/3ML Soln Commonly known as: DUONEB Take 3 mLs by nebulization every 6 (six) hours as needed. What changed:  when to take this additional instructions   levothyroxine  137 MCG tablet Commonly known as: SYNTHROID  Take 1 tablet (137 mcg total) by mouth daily before breakfast.   MiraLax  17 GM/SCOOP powder Generic drug: polyethylene glycol powder Take 8.5-17 g by mouth daily as needed for mild constipation (to be mixed into 4-8 ounces of liquid).   multivitamin capsule Take 1 capsule by mouth daily with breakfast.   NON FORMULARY Take 2 tablets by mouth See admin instructions. SuperBeets Heart Chews- Chew 2 tablets by mouth once a day   potassium chloride  10 MEQ tablet Commonly known as: KLOR-CON  Take 1 tablet (10 mEq total) by mouth daily.   rOPINIRole  0.25 MG tablet Commonly known as: REQUIP  Take 1-3 tablets (0.25-0.75 mg total) by mouth See admin instructions. Take 0.75 mg by mouth at bedtime and an additional 0.25 mg as needed for unresolved  restless legs   Spacer/Aero-Holding [employer] Use with inhaler.   traMADol  50 MG tablet Commonly known as: ULTRAM  Take  0.5 tablets (25 mg total) by mouth every 8 (eight) hours as needed (for pain).   triamcinolone  ointment 0.1 % Commonly known as: KENALOG  Apply 1 Application topically 2 (two) times daily as needed (irritation).   Tylenol  8 Hour Arthritis Pain 650 MG CR tablet Generic drug: acetaminophen  Take 650 mg by mouth in the morning and at bedtime.   Vitamin D3 125 MCG (5000 UT) Tabs Take 5,000 Units by mouth daily with lunch.   Voltaren 1 % Gel Generic drug: diclofenac Sodium Apply 2 g topically See admin instructions. Apply 2 grams to the affected areas at bedtime and an additional 2 grams up to three times a day as needed for pain        Contact information for after-discharge care     Destination     [facility] .   Service: Skilled Nursing Contact information: [address] [place]  [address] [phone]                      Time coordinating discharge: 39 minutes  Signed:  Aamori Mcmasters  [facility] 12/28/2023, 11:58 AM

## 2023-12-29 DIAGNOSIS — G2581 Restless legs syndrome: Secondary | ICD-10-CM | POA: Diagnosis not present

## 2023-12-29 DIAGNOSIS — J9611 Chronic respiratory failure with hypoxia: Secondary | ICD-10-CM | POA: Diagnosis not present

## 2023-12-29 DIAGNOSIS — I509 Heart failure, unspecified: Secondary | ICD-10-CM | POA: Diagnosis not present

## 2023-12-29 DIAGNOSIS — I132 Hypertensive heart and chronic kidney disease with heart failure and with stage 5 chronic kidney disease, or end stage renal disease: Secondary | ICD-10-CM | POA: Diagnosis not present

## 2023-12-29 DIAGNOSIS — J440 Chronic obstructive pulmonary disease with acute lower respiratory infection: Secondary | ICD-10-CM | POA: Diagnosis not present

## 2023-12-30 ENCOUNTER — Ambulatory Visit: Admitting: Nurse Practitioner

## 2023-12-31 DIAGNOSIS — I509 Heart failure, unspecified: Secondary | ICD-10-CM | POA: Diagnosis not present

## 2023-12-31 DIAGNOSIS — G2581 Restless legs syndrome: Secondary | ICD-10-CM | POA: Diagnosis not present

## 2023-12-31 DIAGNOSIS — I132 Hypertensive heart and chronic kidney disease with heart failure and with stage 5 chronic kidney disease, or end stage renal disease: Secondary | ICD-10-CM | POA: Diagnosis not present

## 2023-12-31 DIAGNOSIS — M6281 Muscle weakness (generalized): Secondary | ICD-10-CM | POA: Diagnosis not present

## 2023-12-31 DIAGNOSIS — J440 Chronic obstructive pulmonary disease with acute lower respiratory infection: Secondary | ICD-10-CM | POA: Diagnosis not present

## 2023-12-31 DIAGNOSIS — J9611 Chronic respiratory failure with hypoxia: Secondary | ICD-10-CM | POA: Diagnosis not present

## 2023-12-31 DIAGNOSIS — J449 Chronic obstructive pulmonary disease, unspecified: Secondary | ICD-10-CM | POA: Diagnosis not present

## 2023-12-31 DIAGNOSIS — R2689 Other abnormalities of gait and mobility: Secondary | ICD-10-CM | POA: Diagnosis not present

## 2023-12-31 DIAGNOSIS — I5032 Chronic diastolic (congestive) heart failure: Secondary | ICD-10-CM | POA: Diagnosis not present

## 2023-12-31 DIAGNOSIS — J9621 Acute and chronic respiratory failure with hypoxia: Secondary | ICD-10-CM | POA: Diagnosis not present

## 2023-12-31 DIAGNOSIS — I4819 Other persistent atrial fibrillation: Secondary | ICD-10-CM | POA: Diagnosis not present

## 2023-12-31 DIAGNOSIS — N1832 Chronic kidney disease, stage 3b: Secondary | ICD-10-CM | POA: Diagnosis not present

## 2024-01-04 DIAGNOSIS — J440 Chronic obstructive pulmonary disease with acute lower respiratory infection: Secondary | ICD-10-CM | POA: Diagnosis not present

## 2024-01-04 DIAGNOSIS — J9611 Chronic respiratory failure with hypoxia: Secondary | ICD-10-CM | POA: Diagnosis not present

## 2024-01-04 DIAGNOSIS — I132 Hypertensive heart and chronic kidney disease with heart failure and with stage 5 chronic kidney disease, or end stage renal disease: Secondary | ICD-10-CM | POA: Diagnosis not present

## 2024-01-04 DIAGNOSIS — I5032 Chronic diastolic (congestive) heart failure: Secondary | ICD-10-CM | POA: Diagnosis not present

## 2024-01-04 DIAGNOSIS — I4819 Other persistent atrial fibrillation: Secondary | ICD-10-CM | POA: Diagnosis not present

## 2024-01-04 DIAGNOSIS — I509 Heart failure, unspecified: Secondary | ICD-10-CM | POA: Diagnosis not present

## 2024-01-04 DIAGNOSIS — J9621 Acute and chronic respiratory failure with hypoxia: Secondary | ICD-10-CM | POA: Diagnosis not present

## 2024-01-04 DIAGNOSIS — N1832 Chronic kidney disease, stage 3b: Secondary | ICD-10-CM | POA: Diagnosis not present

## 2024-01-04 DIAGNOSIS — R2689 Other abnormalities of gait and mobility: Secondary | ICD-10-CM | POA: Diagnosis not present

## 2024-01-04 DIAGNOSIS — M6281 Muscle weakness (generalized): Secondary | ICD-10-CM | POA: Diagnosis not present

## 2024-01-04 DIAGNOSIS — J449 Chronic obstructive pulmonary disease, unspecified: Secondary | ICD-10-CM | POA: Diagnosis not present

## 2024-01-06 DIAGNOSIS — Z7901 Long term (current) use of anticoagulants: Secondary | ICD-10-CM | POA: Diagnosis not present

## 2024-01-06 DIAGNOSIS — I509 Heart failure, unspecified: Secondary | ICD-10-CM | POA: Diagnosis not present

## 2024-01-06 DIAGNOSIS — I132 Hypertensive heart and chronic kidney disease with heart failure and with stage 5 chronic kidney disease, or end stage renal disease: Secondary | ICD-10-CM | POA: Diagnosis not present

## 2024-01-06 DIAGNOSIS — G2581 Restless legs syndrome: Secondary | ICD-10-CM | POA: Diagnosis not present

## 2024-01-06 DIAGNOSIS — J441 Chronic obstructive pulmonary disease with (acute) exacerbation: Secondary | ICD-10-CM | POA: Diagnosis not present

## 2024-01-06 DIAGNOSIS — R1312 Dysphagia, oropharyngeal phase: Secondary | ICD-10-CM | POA: Diagnosis not present

## 2024-01-06 DIAGNOSIS — N1832 Chronic kidney disease, stage 3b: Secondary | ICD-10-CM | POA: Diagnosis not present

## 2024-01-06 DIAGNOSIS — I110 Hypertensive heart disease with heart failure: Secondary | ICD-10-CM | POA: Diagnosis not present

## 2024-01-06 DIAGNOSIS — J9611 Chronic respiratory failure with hypoxia: Secondary | ICD-10-CM | POA: Diagnosis not present

## 2024-01-07 ENCOUNTER — Ambulatory Visit: Admitting: Podiatry

## 2024-01-07 DIAGNOSIS — I5032 Chronic diastolic (congestive) heart failure: Secondary | ICD-10-CM | POA: Diagnosis not present

## 2024-01-07 DIAGNOSIS — I4819 Other persistent atrial fibrillation: Secondary | ICD-10-CM | POA: Diagnosis not present

## 2024-01-07 DIAGNOSIS — J449 Chronic obstructive pulmonary disease, unspecified: Secondary | ICD-10-CM | POA: Diagnosis not present

## 2024-01-07 DIAGNOSIS — R2689 Other abnormalities of gait and mobility: Secondary | ICD-10-CM | POA: Diagnosis not present

## 2024-01-07 DIAGNOSIS — N1832 Chronic kidney disease, stage 3b: Secondary | ICD-10-CM | POA: Diagnosis not present

## 2024-01-07 DIAGNOSIS — M6281 Muscle weakness (generalized): Secondary | ICD-10-CM | POA: Diagnosis not present

## 2024-01-07 DIAGNOSIS — J9621 Acute and chronic respiratory failure with hypoxia: Secondary | ICD-10-CM | POA: Diagnosis not present

## 2024-01-10 ENCOUNTER — Ambulatory Visit: Admitting: Cardiovascular Disease

## 2024-01-10 DIAGNOSIS — N1832 Chronic kidney disease, stage 3b: Secondary | ICD-10-CM | POA: Diagnosis not present

## 2024-01-10 DIAGNOSIS — M6281 Muscle weakness (generalized): Secondary | ICD-10-CM | POA: Diagnosis not present

## 2024-01-10 DIAGNOSIS — J441 Chronic obstructive pulmonary disease with (acute) exacerbation: Secondary | ICD-10-CM | POA: Diagnosis not present

## 2024-01-10 DIAGNOSIS — J9621 Acute and chronic respiratory failure with hypoxia: Secondary | ICD-10-CM | POA: Diagnosis not present

## 2024-01-10 DIAGNOSIS — I110 Hypertensive heart disease with heart failure: Secondary | ICD-10-CM | POA: Diagnosis not present

## 2024-01-10 DIAGNOSIS — R2681 Unsteadiness on feet: Secondary | ICD-10-CM | POA: Diagnosis not present

## 2024-01-10 DIAGNOSIS — J9611 Chronic respiratory failure with hypoxia: Secondary | ICD-10-CM | POA: Diagnosis not present

## 2024-01-10 DIAGNOSIS — J9601 Acute respiratory failure with hypoxia: Secondary | ICD-10-CM | POA: Diagnosis not present

## 2024-01-10 DIAGNOSIS — Z7901 Long term (current) use of anticoagulants: Secondary | ICD-10-CM | POA: Diagnosis not present

## 2024-01-10 DIAGNOSIS — I4819 Other persistent atrial fibrillation: Secondary | ICD-10-CM | POA: Diagnosis not present

## 2024-01-11 ENCOUNTER — Telehealth: Payer: Self-pay

## 2024-01-11 ENCOUNTER — Other Ambulatory Visit: Payer: Self-pay

## 2024-01-11 DIAGNOSIS — N185 Chronic kidney disease, stage 5: Secondary | ICD-10-CM | POA: Diagnosis not present

## 2024-01-11 DIAGNOSIS — J441 Chronic obstructive pulmonary disease with (acute) exacerbation: Secondary | ICD-10-CM | POA: Diagnosis not present

## 2024-01-11 DIAGNOSIS — I4819 Other persistent atrial fibrillation: Secondary | ICD-10-CM | POA: Diagnosis not present

## 2024-01-11 DIAGNOSIS — D6869 Other thrombophilia: Secondary | ICD-10-CM | POA: Diagnosis not present

## 2024-01-11 DIAGNOSIS — N179 Acute kidney failure, unspecified: Secondary | ICD-10-CM | POA: Diagnosis not present

## 2024-01-11 DIAGNOSIS — R1310 Dysphagia, unspecified: Secondary | ICD-10-CM | POA: Diagnosis not present

## 2024-01-11 DIAGNOSIS — I132 Hypertensive heart and chronic kidney disease with heart failure and with stage 5 chronic kidney disease, or end stage renal disease: Secondary | ICD-10-CM | POA: Diagnosis not present

## 2024-01-11 DIAGNOSIS — J9621 Acute and chronic respiratory failure with hypoxia: Secondary | ICD-10-CM | POA: Diagnosis not present

## 2024-01-11 DIAGNOSIS — I5032 Chronic diastolic (congestive) heart failure: Secondary | ICD-10-CM | POA: Diagnosis not present

## 2024-01-11 NOTE — Transitions of Care (Post Inpatient/ED Visit) (Signed)
 01/11/2024  Name: Samuel Mcconnell MRN: [id number] DOB: [birth year]  Today's TOC FU Call Status: Today's TOC FU Call Status:: Successful TOC FU Call Completed TOC FU Call Complete Date: 01/11/24 Patient's Name and Date of Birth confirmed.  Transition Care Management Follow-up Telephone Call Date of Discharge: 01/10/24 Discharge Facility: Other ([facility]) Name of Other ([facility]) Discharge Facility: [facility] Type of Discharge: Inpatient Admission Primary Inpatient Discharge Diagnosis:: COPD How have you been since you were released from the hospital?: Better Any questions or concerns?: No  Items Reviewed: Did you receive and understand the discharge instructions provided?: Yes Medications obtained,verified, and reconciled?: Yes (Medications Reviewed) Any new allergies since your discharge?: No Dietary orders reviewed?: Yes Do you have support at home?: Yes People in Home [RPT]: child(ren), adult  Medications Reviewed Today: Medications Reviewed Today     Reviewed by Emmitt Pan, LPN (Licensed Practical Nurse) on 01/11/24 at [id number]  Med List Status: <None>   Medication Order Taking? Sig Documenting Provider Last Dose Status Informant  albuterol  (VENTOLIN  HFA) 108 (90 Base) MCG/ACT inhaler [id number] Yes Inhale 2 puffs into the lungs every 6 (six) hours as needed for wheezing or shortness of breath. Willo Mini, NP  Active Family Member  [facility] ALLERGY 180 MG tablet [id number] Yes Take 180 mg by mouth at bedtime. [provider]  Active Family Member  SONJIA JESSE SCHLOSSMAN MEDICATION [id number] Yes Please provide toilet riser/elevator with arms.  Diagnosis: Generalized weakness R53.1 Alvia Bring, DO  Active   AMBULATORY NON FORMULARY MEDICATION [id number] Yes Please provide home oxygen concentrator.  Flow 2 LPM O2 on RA at rest-89% O2 on 2LPM O2 at rest- 94% Alvia Bring, DO  Active   amiodarone  (PACERONE ) 200 MG tablet [id number] Yes Take 1 tablet (200  mg total) by mouth daily. Croitoru, Mihai, MD  Active Family Member  apixaban  (ELIQUIS ) 2.5 MG TABS tablet [id number] Yes Take 1 tablet (2.5 mg total) by mouth 2 (two) times daily. Croitoru, Mihai, MD  Active Family Member  ascorbic acid (VITAMIN C) 500 MG tablet [id number] Yes Take 500-1,000 mg by mouth in the morning. [provider]  Active Family Member  BREZTRI  AEROSPHERE 160-9-4.8 MCG/ACT AERO inhaler [id number] Yes INHALE 2 PUFFS INTO THE LUNGS 2 TIMES DAILY Alvia Bring, DO  Active Family Member  Cholecalciferol  (VITAMIN D3) 125 MCG (5000 UT) TABS [id number] Yes Take 5,000 Units by mouth daily with lunch. [provider]  Active Family Member  diclofenac Sodium (VOLTAREN) 1 % GEL [id number] Yes Apply 2 g topically See admin instructions. Apply 2 grams to the affected areas at bedtime and an additional 2 grams up to three times a day as needed for pain [provider]  Active Family Member  diphenhydramine -acetaminophen  (TYLENOL  PM) 25-500 MG TABS tablet [id number] Yes Take 1 tablet by mouth at bedtime as needed (for sleeplessness- if no Tylenol  650 mg tablets have been taken). [provider]  Active Family Member  ferrous gluconate (FERGON) 240 (27 FE) MG tablet [id number] Yes Take 1 tablet (240 mg total) by mouth 2 (two) times daily before a meal. Pokhrel, Laxman, MD  Active   fluticasone  (FLONASE ) 50 MCG/ACT nasal spray [id number] Yes Place 2 sprays into both nostrils daily as needed for allergies or rhinitis.  Patient taking differently: Place 2 sprays into both nostrils daily.   Willo Mini, NP  Active Family Member  ipratropium-albuterol  (DUONEB) 0.5-2.5 (3) MG/3ML SOLN [id number] Yes Take 3 mLs by nebulization every 6 (six) hours as needed.  Patient taking  differently: Take 3 mLs by nebulization See admin instructions. Nebulize 3 ml's and inhale into the lungs two times a day and an additional twice a day as needed for shortness of breath or wheezing    Alvia Bring, DO  Active Family Member  levothyroxine  (SYNTHROID ) 137 MCG tablet [id number] Yes Take 1 tablet (137 mcg total) by mouth daily before breakfast. Willo Mini, NP  Active Family Member  MIRALAX  17 GM/SCOOP powder [id number] Yes Take 8.5-17 g by mouth daily as needed for mild constipation (to be mixed into 4-8 ounces of liquid). [provider]  Active Family Member  Multiple Vitamin (MULTIVITAMIN) capsule [id number] Yes Take 1 capsule by mouth daily with breakfast. [provider]  Active Family Member  NON FORMULARY [id number] Yes Take 2 tablets by mouth See admin instructions. SuperBeets Heart Chews- Chew 2 tablets by mouth once a day [provider]  Active Family Member  potassium chloride  (KLOR-CON ) 10 MEQ tablet [id number] Yes Take 1 tablet (10 mEq total) by mouth daily. Croitoru, Mihai, MD  Active Family Member  rOPINIRole  (REQUIP ) 0.25 MG tablet [id number] Yes Take 1-3 tablets (0.25-0.75 mg total) by mouth See admin instructions. Take 0.75 mg by mouth at bedtime and an additional 0.25 mg as needed for unresolved restless legs Sonjia Held, MD  Active   Spacer/Aero-Holding Raguel FRENCH [id number] Yes Use with inhaler. Alvia Bring, DO  Active   traMADol  (ULTRAM ) 50 MG tablet [id number] Yes Take 0.5 tablets (25 mg total) by mouth every 8 (eight) hours as needed (for pain). Pokhrel, Laxman, MD  Active   triamcinolone  ointment (KENALOG ) 0.1 % [id number] Yes Apply 1 Application topically 2 (two) times daily as needed (irritation). [provider]  Active Family Member  TYLENOL  8 HOUR ARTHRITIS PAIN 650 MG CR tablet [id number] Yes Take 650 mg by mouth in the morning and at bedtime. [provider]  Active Family Member            Home Care and Equipment/Supplies: Were Home Health Services Ordered?: Yes Name of Home Health Agency:: [facility] Has Agency set up a time to come to your home?: Yes First Home Health Visit Date: 01/11/24 Any  new equipment or medical supplies ordered?: Yes Name of Medical supply agency?: unknown Were you able to get the equipment/medical supplies?: Yes Do you have any questions related to the use of the equipment/supplies?: No  Functional Questionnaire: Do you need assistance with bathing/showering or dressing?: Yes Do you need assistance with meal preparation?: Yes Do you need assistance with eating?: No Do you have difficulty maintaining continence: Yes Do you need assistance with getting out of bed/getting out of a chair/moving?: No Do you have difficulty managing or taking your medications?: No  Follow up appointments reviewed: PCP Follow-up appointment confirmed?: Yes Date of PCP follow-up appointment?: 01/20/24 Follow-up Provider: [facility] Follow-up appointment confirmed?: NA Do you need transportation to your follow-up appointment?: No Do you understand care options if your condition(s) worsen?: Yes-patient verbalized understanding    SIGNATURE Julian Lemmings, LPN [facility] Nurse Health Advisor Direct Dial [phone]

## 2024-01-12 ENCOUNTER — Telehealth: Payer: Self-pay

## 2024-01-12 NOTE — Telephone Encounter (Signed)
 Copied from CRM [id number]. Topic: Clinical - Home Health Verbal Orders >> Jan 12, 2024  9:05 AM Berneda FALCON wrote: Caller/Agency: Nena, nurse, with [facility] Number: [phone] (secured line) Service Requested: Skilled Nursing Frequency: weekly X4, and every other week X2 for managed COPD Any new concerns about the patient? No

## 2024-01-12 NOTE — Telephone Encounter (Signed)
 CRM routed to incorrect office.   Please see message below

## 2024-01-12 NOTE — Telephone Encounter (Signed)
 Nena with [facility] informed of verbal approval .

## 2024-01-13 ENCOUNTER — Other Ambulatory Visit: Payer: Self-pay | Admitting: Family Medicine

## 2024-01-13 ENCOUNTER — Telehealth: Payer: Self-pay

## 2024-01-13 MED ORDER — ROPINIROLE HCL 0.25 MG PO TABS: ORAL_TABLET | ORAL | 3 refills | Status: AC

## 2024-01-13 NOTE — Telephone Encounter (Signed)
 Copied from CRM [id number]. Topic: General - Other >> Jan 13, 2024  9:09 AM Joesph NOVAK wrote: Reason for CRM: Samuel Mcconnell is calling to speak to [facility].

## 2024-01-14 NOTE — Telephone Encounter (Signed)
 Spoke w/pt's daughter and she stated that she looked around for the forms that Panya had asked her about and did not locate those.

## 2024-01-18 ENCOUNTER — Telehealth: Payer: Self-pay

## 2024-01-18 NOTE — Telephone Encounter (Signed)
 Samuel Mcconnell with [facility] given verbal orders approval.

## 2024-01-18 NOTE — Telephone Encounter (Signed)
 Copied from CRM #[id number]. Topic: Clinical - Home Health Verbal Orders >> Jan 18, 2024 12:15 PM Sasha M wrote: Caller/Agency: Gracie/[facility] Callback Number: [phone] Service Requested: Physical Therapy Frequency: 2 times a week for 4 weeks and then once a week for 4 weeks Any new concerns about the patient? No, but pt just came home from hospital stay so [facility] will be working on strengthening balance and ambulation

## 2024-01-19 DIAGNOSIS — J9621 Acute and chronic respiratory failure with hypoxia: Secondary | ICD-10-CM | POA: Diagnosis not present

## 2024-01-19 DIAGNOSIS — N179 Acute kidney failure, unspecified: Secondary | ICD-10-CM | POA: Diagnosis not present

## 2024-01-19 DIAGNOSIS — I132 Hypertensive heart and chronic kidney disease with heart failure and with stage 5 chronic kidney disease, or end stage renal disease: Secondary | ICD-10-CM | POA: Diagnosis not present

## 2024-01-19 DIAGNOSIS — J441 Chronic obstructive pulmonary disease with (acute) exacerbation: Secondary | ICD-10-CM | POA: Diagnosis not present

## 2024-01-19 DIAGNOSIS — I4819 Other persistent atrial fibrillation: Secondary | ICD-10-CM | POA: Diagnosis not present

## 2024-01-19 DIAGNOSIS — R1310 Dysphagia, unspecified: Secondary | ICD-10-CM | POA: Diagnosis not present

## 2024-01-19 DIAGNOSIS — D6869 Other thrombophilia: Secondary | ICD-10-CM | POA: Diagnosis not present

## 2024-01-19 DIAGNOSIS — N185 Chronic kidney disease, stage 5: Secondary | ICD-10-CM | POA: Diagnosis not present

## 2024-01-19 DIAGNOSIS — I5032 Chronic diastolic (congestive) heart failure: Secondary | ICD-10-CM | POA: Diagnosis not present

## 2024-01-20 ENCOUNTER — Ambulatory Visit (INDEPENDENT_AMBULATORY_CARE_PROVIDER_SITE_OTHER): Admitting: Family Medicine

## 2024-01-20 ENCOUNTER — Encounter: Payer: Self-pay | Admitting: Family Medicine

## 2024-01-20 ENCOUNTER — Ambulatory Visit

## 2024-01-20 VITALS — BP 95/56 | HR 72 | Ht 71.5 in | Wt 178.0 lb

## 2024-01-20 DIAGNOSIS — I4819 Other persistent atrial fibrillation: Secondary | ICD-10-CM

## 2024-01-20 DIAGNOSIS — I5032 Chronic diastolic (congestive) heart failure: Secondary | ICD-10-CM

## 2024-01-20 DIAGNOSIS — J984 Other disorders of lung: Secondary | ICD-10-CM | POA: Diagnosis not present

## 2024-01-20 DIAGNOSIS — D509 Iron deficiency anemia, unspecified: Secondary | ICD-10-CM

## 2024-01-20 DIAGNOSIS — E039 Hypothyroidism, unspecified: Secondary | ICD-10-CM

## 2024-01-20 DIAGNOSIS — M25551 Pain in right hip: Secondary | ICD-10-CM

## 2024-01-20 DIAGNOSIS — I10 Essential (primary) hypertension: Secondary | ICD-10-CM

## 2024-01-20 DIAGNOSIS — J449 Chronic obstructive pulmonary disease, unspecified: Secondary | ICD-10-CM

## 2024-01-20 DIAGNOSIS — M1611 Unilateral primary osteoarthritis, right hip: Secondary | ICD-10-CM | POA: Diagnosis not present

## 2024-01-20 DIAGNOSIS — R2681 Unsteadiness on feet: Secondary | ICD-10-CM

## 2024-01-20 DIAGNOSIS — J9601 Acute respiratory failure with hypoxia: Secondary | ICD-10-CM | POA: Diagnosis not present

## 2024-01-20 DIAGNOSIS — R102 Pelvic and perineal pain unspecified side: Secondary | ICD-10-CM | POA: Diagnosis not present

## 2024-01-20 NOTE — Progress Notes (Signed)
 Samuel Mcconnell - 89 y.o. male MRN [id number]  Date of birth: Feb 18, 1928  Subjective Chief Complaint  Patient presents with   Fall    HPI Samuel Mcconnell is a 89 year old male here today for hospital follow-up.   Recent admission for hypoxia secondary to COPD exacerbation/possible pneumonia.  Initially treated with BiPAP.  Received DuoNebs and supplemental oxygen during hospitalization.  Negative blood cultures and respiratory virus panel was negative.  He was treated with course of antibiotics and discharged with oxygen.  He was evaluated by SLP who recommended mechanical soft with aspiration precautions.  Mild acute kidney injury with serum creatinine up to 1.81.  Gently hydrated with IV fluids..  He did have some sundowning and delirium related to his admission.  Able to be redirected.  He was discharged to rehab facility.  He has been working with physical therapy to help with regaining his strength.  He did have a fall recently.  Extensive bruising along the right buttock and thigh area.  He does have some pain associated with this.  He is able to ambulate and bear weight on this side.  He is planning on moving to a new facility to live in [place] soon.  He needs an FL 2 completed.  ROS:  A comprehensive ROS was completed and negative except as noted per HPI  Allergies  Allergen Reactions   Clindamycin/Lincomycin Other (See Comments)    Patient felt weird    Dabigatran Etexilate Mesylate Other (See Comments)    felt weird (name brand Pradaxa)   Doxycycline  Other (See Comments)    Dyspepsia    Past Medical History:  Diagnosis Date   Arthritis    Atrial fibrillation (HCC)    radiofrequency ablation   Cataract    CHF (congestive heart failure) (HCC)    Clotting disorder    due to medication   Emphysema    Emphysema of lung (HCC)    Hypertension    Hypoxemia 12/14/2023   Thyroid  disease    Ulcer    GI years prior    Past Surgical History:  Procedure  Laterality Date   CARDIAC SURGERY     CARDIOVERSION N/A 10/24/2019   Procedure: CARDIOVERSION;  Surgeon: Francyne Headland, MD;  Location: [facility] ENDOSCOPY;  Service: Cardiovascular;  Laterality: N/A;   CARDIOVERSION N/A 10/22/2020   Procedure: CARDIOVERSION;  Surgeon: Alveta Aleene PARAS, MD;  Location: [facility] ENDOSCOPY;  Service: Cardiovascular;  Laterality: N/A;   COLON SURGERY  03/23/1997   EYE SURGERY     KNEE ARTHROSCOPY Left 07/21/2000   LOOP RECORDER IMPLANT  01/16/2010   Medtronic Reveal XT (Dr. HILARIO Jester)    RADIOFREQUENCY ABLATION  06/13/2009   SHOULDER ARTHROSCOPY WITH ROTATOR CUFF REPAIR Left 04/23/1996   TIBIAL TUBERCLERPLASTY  ~ 10/2010   TRANSTHORACIC ECHOCARDIOGRAM  11/19/2011   EF=>55%; mild MR, mild mitral annular calcif; mild TR, normal RSVP; AV mildly sclerotic, mild AV regurg; trace pulm valve regurg     Social History   Socioeconomic History   Marital status: Married    Spouse name: Samuel Mcconnell   Number of children: 2   Years of education: 16   Highest education level: Bachelor's degree (e.g., BA, AB, BS)  Occupational History   Occupation: Retired  Tobacco Use   Smoking status: Former    Current packs/day: 0.00    Types: Cigarettes    Start date: 03/14/1956    Quit date: 03/14/2002    Years since quitting: 21.8   Smokeless tobacco: Never   Tobacco comments:  Former smoker 01/23/2021  Vaping Use   Vaping status: Never Used  Substance and Sexual Activity   Alcohol use: Not Currently    Alcohol/week: 1.0 standard drink of alcohol    Types: 1 Glasses of wine per week   Drug use: No   Sexual activity: Not Currently    Partners: Female    Birth control/protection: None  Other Topics Concern   Not on file  Social History Narrative   Lives at [facility] and lives alone. He enjoys walking and going to the gym.   Social Drivers of Corporate Investment Banker Strain: Low Risk  (11/30/2023)   Overall Financial Resource Strain (CARDIA)    Difficulty of Paying  Living Expenses: Not hard at all  Food Insecurity: No Food Insecurity (12/22/2023)   Hunger Vital Sign    Worried About Running Out of Food in the Last Year: Never true    Ran Out of Food in the Last Year: Never true  Transportation Needs: No Transportation Needs (12/22/2023)   PRAPARE - Administrator, Civil Service (Medical): No    Lack of Transportation (Non-Medical): No  Physical Activity: Insufficiently Active (11/30/2023)   Exercise Vital Sign    Days of Exercise per Week: 1 day    Minutes of Exercise per Session: 10 min  Stress: No Stress Concern Present (11/30/2023)   [employer] of Occupational Health - Occupational Stress Questionnaire    Feeling of Stress: Not at all  Social Connections: Moderately Integrated (12/22/2023)   Social Connection and Isolation Panel    Frequency of Communication with Friends and Family: More than three times a week    Frequency of Social Gatherings with Friends and Family: More than three times a week    Attends Religious Services: More than 4 times per year    Active Member of [employer] or Organizations: Yes    Attends Banker Meetings: More than 4 times per year    Marital Status: Widowed    Family History  Problem Relation Age of Onset   Heart failure Mother    Heart failure Father        MI - died @ 21   Atrial fibrillation Sister    Hypertension Sister    Heart Problems Sister    Diabetes Sister    Heart failure Sister    Atrial fibrillation Brother        dx'ed age 74   Atrial fibrillation Brother        dx'ed age 89   Heart failure Brother    Diabetes Son    Pancreatitis Son        also ETOH abuse    Cancer Maternal Grandmother    Heart disease Maternal Grandfather     Health Maintenance  Topic Date Due   COVID-19 Vaccine (8 - 2025-26 season) 02/04/2025 (Originally 11/22/2023)   Medicare Annual Wellness (AWV)  11/29/2024   DTaP/Tdap/Td (3 - Td or Tdap) 09/06/2029   Pneumococcal Vaccine: 50+ Years   Completed   Influenza Vaccine  Completed   Zoster Vaccines- Shingrix  Completed   Meningococcal B Vaccine  Aged Out     ----------------------------------------------------------------------------------------------------------------------------------------------------------------------------------------------------------------- Physical Exam BP (!) 95/56 (BP Location: Left Arm, Patient Position: Sitting, Cuff Size: Normal)   Pulse 72   Ht 5' 11.5 (1.816 m)   Wt 178 lb (80.7 kg)   SpO2 92%   BMI 24.48 kg/m   Physical Exam Constitutional:      Appearance: Normal  appearance.  Eyes:     General: No scleral icterus. Cardiovascular:     Rate and Rhythm: Normal rate and regular rhythm.  Pulmonary:     Effort: Pulmonary effort is normal.     Breath sounds: Normal breath sounds.  Skin:    Comments: Bruising from right lateral buttock area down the lateral posterior thigh.  Neurological:     Mental Status: He is alert.  Psychiatric:        Mood and Affect: Mood normal.        Behavior: Behavior normal.     ------------------------------------------------------------------------------------------------------------------------------------------------------------------------------------------------------------------- Assessment and Plan  Essential hypertension Blood pressure stable at this time.  He did have a couple episodes of hypotension when standing with physical therapy.  Encouraged to wear compression stockings and stand slowly.  Persistent atrial fibrillation (HCC) Remains off of antiarrhythmics.  Followed by cardiology.  Stable at this time.  Continue eliquis  for anticoagulation.  He has had a couple falls recently and we discussed that if he continues to have imbalance and is at high risk for falls we may need to discontinue his Eliquis .  Chronic obstructive pulmonary disease (HCC) His COPD exacerbation with possible pneumonia.  Overall doing better at this time.  He does  have some crackles in the left lower lobe the wall of the chest x-ray.  He will continue with oxygen as well as use of current inhalers.  Gait instability Continues with physical therapy.  Encouraged continued use of assistive device.  He did have recent fall.  Servisone  mainly does have bruising.  Will get x-rays of the pelvis since he is fairly tender along the ischial tuberosities.  Acute respiratory failure with hypoxia (HCC) Continues on home oxygen.  Continue current inhalers.   No orders of the defined types were placed in this encounter.   No follow-ups on file.

## 2024-01-21 DIAGNOSIS — D6869 Other thrombophilia: Secondary | ICD-10-CM | POA: Diagnosis not present

## 2024-01-21 DIAGNOSIS — J441 Chronic obstructive pulmonary disease with (acute) exacerbation: Secondary | ICD-10-CM | POA: Diagnosis not present

## 2024-01-21 DIAGNOSIS — I132 Hypertensive heart and chronic kidney disease with heart failure and with stage 5 chronic kidney disease, or end stage renal disease: Secondary | ICD-10-CM | POA: Diagnosis not present

## 2024-01-21 DIAGNOSIS — J9621 Acute and chronic respiratory failure with hypoxia: Secondary | ICD-10-CM | POA: Diagnosis not present

## 2024-01-21 DIAGNOSIS — N179 Acute kidney failure, unspecified: Secondary | ICD-10-CM | POA: Diagnosis not present

## 2024-01-21 DIAGNOSIS — I5032 Chronic diastolic (congestive) heart failure: Secondary | ICD-10-CM | POA: Diagnosis not present

## 2024-01-21 DIAGNOSIS — N185 Chronic kidney disease, stage 5: Secondary | ICD-10-CM | POA: Diagnosis not present

## 2024-01-21 DIAGNOSIS — R1310 Dysphagia, unspecified: Secondary | ICD-10-CM | POA: Diagnosis not present

## 2024-01-21 DIAGNOSIS — I4819 Other persistent atrial fibrillation: Secondary | ICD-10-CM | POA: Diagnosis not present

## 2024-01-21 LAB — CBC WITH DIFFERENTIAL/PLATELET
Basophils Absolute: 0 x10E3/uL (ref 0.0–0.2)
Basos: 1 %
EOS (ABSOLUTE): 0.4 x10E3/uL (ref 0.0–0.4)
Eos: 6 %
Hematocrit: 31.3 % — ABNORMAL LOW (ref 37.5–51.0)
Hemoglobin: 9.7 g/dL — ABNORMAL LOW (ref 13.0–17.7)
Immature Grans (Abs): 0 x10E3/uL (ref 0.0–0.1)
Immature Granulocytes: 0 %
Lymphocytes Absolute: 1.9 x10E3/uL (ref 0.7–3.1)
Lymphs: 28 %
MCH: 29.1 pg (ref 26.6–33.0)
MCHC: 31 g/dL — ABNORMAL LOW (ref 31.5–35.7)
MCV: 94 fL (ref 79–97)
Monocytes Absolute: 0.8 x10E3/uL (ref 0.1–0.9)
Monocytes: 12 %
Neutrophils Absolute: 3.5 x10E3/uL (ref 1.4–7.0)
Neutrophils: 53 %
Platelets: 307 x10E3/uL (ref 150–450)
RBC: 3.33 x10E6/uL — ABNORMAL LOW (ref 4.14–5.80)
RDW: 14.6 % (ref 11.6–15.4)
WBC: 6.6 x10E3/uL (ref 3.4–10.8)

## 2024-01-21 LAB — IRON,TIBC AND FERRITIN PANEL
Ferritin: 602 ng/mL — ABNORMAL HIGH (ref 30–400)
Iron Saturation: 25 % (ref 15–55)
Iron: 58 ug/dL (ref 38–169)
Total Iron Binding Capacity: 228 ug/dL — ABNORMAL LOW (ref 250–450)
UIBC: 170 ug/dL (ref 111–343)

## 2024-01-21 LAB — CMP14+EGFR
ALT: 18 IU/L (ref 0–44)
AST: 30 IU/L (ref 0–40)
Albumin: 3.6 g/dL (ref 3.6–4.6)
Alkaline Phosphatase: 62 IU/L (ref 48–129)
BUN/Creatinine Ratio: 27 — ABNORMAL HIGH (ref 10–24)
BUN: 35 mg/dL (ref 10–36)
Bilirubin Total: 0.8 mg/dL (ref 0.0–1.2)
CO2: 29 mmol/L (ref 20–29)
Calcium: 8.9 mg/dL (ref 8.6–10.2)
Chloride: 98 mmol/L (ref 96–106)
Creatinine, Ser: 1.28 mg/dL — ABNORMAL HIGH (ref 0.76–1.27)
Globulin, Total: 1.9 g/dL (ref 1.5–4.5)
Glucose: 102 mg/dL — ABNORMAL HIGH (ref 70–99)
Potassium: 5.2 mmol/L (ref 3.5–5.2)
Sodium: 138 mmol/L (ref 134–144)
Total Protein: 5.5 g/dL — ABNORMAL LOW (ref 6.0–8.5)
eGFR: 52 mL/min/1.73 — ABNORMAL LOW (ref >59)

## 2024-01-22 ENCOUNTER — Other Ambulatory Visit: Payer: Self-pay | Admitting: Cardiovascular Disease

## 2024-01-23 ENCOUNTER — Ambulatory Visit: Payer: Self-pay | Admitting: Family Medicine

## 2024-01-23 NOTE — Assessment & Plan Note (Signed)
 Remains off of antiarrhythmics.  Followed by cardiology.  Stable at this time.  Continue eliquis  for anticoagulation.  He has had a couple falls recently and we discussed that if he continues to have imbalance and is at high risk for falls we may need to discontinue his Eliquis .

## 2024-01-23 NOTE — Assessment & Plan Note (Addendum)
 Continues with physical therapy.  Encouraged continued use of assistive device.  He did have recent fall.  Servisone  mainly does have bruising.  Will get x-rays of the pelvis since he is fairly tender along the ischial tuberosities.

## 2024-01-23 NOTE — Assessment & Plan Note (Signed)
 His COPD exacerbation with possible pneumonia.  Overall doing better at this time.  He does have some crackles in the left lower lobe the wall of the chest x-ray.  He will continue with oxygen as well as use of current inhalers.

## 2024-01-23 NOTE — Assessment & Plan Note (Signed)
 Continues on home oxygen.  Continue current inhalers.

## 2024-01-23 NOTE — Assessment & Plan Note (Signed)
Blood pressure stable at this time.  He did have a couple episodes of hypotension when standing with physical therapy.  Encouraged to wear compression stockings and stand slowly.

## 2024-01-24 ENCOUNTER — Encounter: Payer: Self-pay | Admitting: Family Medicine

## 2024-01-24 DIAGNOSIS — T17998A Other foreign object in respiratory tract, part unspecified causing other injury, initial encounter: Secondary | ICD-10-CM

## 2024-01-24 DIAGNOSIS — R296 Repeated falls: Secondary | ICD-10-CM

## 2024-01-26 ENCOUNTER — Telehealth: Payer: Self-pay

## 2024-01-26 DIAGNOSIS — I5032 Chronic diastolic (congestive) heart failure: Secondary | ICD-10-CM | POA: Diagnosis not present

## 2024-01-26 NOTE — Telephone Encounter (Signed)
 Copied from CRM [id number]. Topic: Clinical - Home Health Verbal Orders >> Jan 26, 2024  3:05 PM Alfonso ORN wrote: Caller/Agency: OT Signe Edison for [facility] Callback Number: [phone] Service Requested: Occupational Therapy Frequency: one time a week for 4 wks Any new concerns about the patient? No

## 2024-01-28 ENCOUNTER — Encounter: Payer: Self-pay | Admitting: Family Medicine

## 2024-01-28 DIAGNOSIS — J9621 Acute and chronic respiratory failure with hypoxia: Secondary | ICD-10-CM | POA: Diagnosis not present

## 2024-01-28 DIAGNOSIS — N179 Acute kidney failure, unspecified: Secondary | ICD-10-CM | POA: Diagnosis not present

## 2024-01-28 DIAGNOSIS — D6869 Other thrombophilia: Secondary | ICD-10-CM | POA: Diagnosis not present

## 2024-01-28 DIAGNOSIS — N185 Chronic kidney disease, stage 5: Secondary | ICD-10-CM | POA: Diagnosis not present

## 2024-01-28 DIAGNOSIS — I5032 Chronic diastolic (congestive) heart failure: Secondary | ICD-10-CM | POA: Diagnosis not present

## 2024-01-28 DIAGNOSIS — J441 Chronic obstructive pulmonary disease with (acute) exacerbation: Secondary | ICD-10-CM | POA: Diagnosis not present

## 2024-01-28 DIAGNOSIS — I132 Hypertensive heart and chronic kidney disease with heart failure and with stage 5 chronic kidney disease, or end stage renal disease: Secondary | ICD-10-CM | POA: Diagnosis not present

## 2024-01-28 DIAGNOSIS — R1310 Dysphagia, unspecified: Secondary | ICD-10-CM | POA: Diagnosis not present

## 2024-01-28 MED ORDER — AMBULATORY NON FORMULARY MEDICATION: 0 refills | Status: AC

## 2024-01-31 DIAGNOSIS — J441 Chronic obstructive pulmonary disease with (acute) exacerbation: Secondary | ICD-10-CM | POA: Diagnosis not present

## 2024-01-31 DIAGNOSIS — I5032 Chronic diastolic (congestive) heart failure: Secondary | ICD-10-CM | POA: Diagnosis not present

## 2024-01-31 DIAGNOSIS — N179 Acute kidney failure, unspecified: Secondary | ICD-10-CM | POA: Diagnosis not present

## 2024-01-31 DIAGNOSIS — N185 Chronic kidney disease, stage 5: Secondary | ICD-10-CM | POA: Diagnosis not present

## 2024-01-31 DIAGNOSIS — J9621 Acute and chronic respiratory failure with hypoxia: Secondary | ICD-10-CM | POA: Diagnosis not present

## 2024-01-31 DIAGNOSIS — I4819 Other persistent atrial fibrillation: Secondary | ICD-10-CM | POA: Diagnosis not present

## 2024-01-31 DIAGNOSIS — I132 Hypertensive heart and chronic kidney disease with heart failure and with stage 5 chronic kidney disease, or end stage renal disease: Secondary | ICD-10-CM | POA: Diagnosis not present

## 2024-01-31 DIAGNOSIS — D6869 Other thrombophilia: Secondary | ICD-10-CM | POA: Diagnosis not present

## 2024-01-31 DIAGNOSIS — R1310 Dysphagia, unspecified: Secondary | ICD-10-CM | POA: Diagnosis not present

## 2024-02-04 DIAGNOSIS — I4819 Other persistent atrial fibrillation: Secondary | ICD-10-CM | POA: Diagnosis not present

## 2024-02-04 DIAGNOSIS — N185 Chronic kidney disease, stage 5: Secondary | ICD-10-CM | POA: Diagnosis not present

## 2024-02-04 DIAGNOSIS — D6869 Other thrombophilia: Secondary | ICD-10-CM | POA: Diagnosis not present

## 2024-02-04 DIAGNOSIS — R1310 Dysphagia, unspecified: Secondary | ICD-10-CM | POA: Diagnosis not present

## 2024-02-04 DIAGNOSIS — I132 Hypertensive heart and chronic kidney disease with heart failure and with stage 5 chronic kidney disease, or end stage renal disease: Secondary | ICD-10-CM | POA: Diagnosis not present

## 2024-02-04 DIAGNOSIS — N179 Acute kidney failure, unspecified: Secondary | ICD-10-CM | POA: Diagnosis not present

## 2024-02-04 DIAGNOSIS — J441 Chronic obstructive pulmonary disease with (acute) exacerbation: Secondary | ICD-10-CM | POA: Diagnosis not present

## 2024-02-04 DIAGNOSIS — I5032 Chronic diastolic (congestive) heart failure: Secondary | ICD-10-CM | POA: Diagnosis not present

## 2024-02-04 DIAGNOSIS — J9621 Acute and chronic respiratory failure with hypoxia: Secondary | ICD-10-CM | POA: Diagnosis not present

## 2024-02-05 ENCOUNTER — Encounter: Payer: Self-pay | Admitting: Family Medicine

## 2024-02-07 MED ORDER — FERROUS GLUCONATE 240 (27 FE) MG PO TABS: 240.0000 mg | ORAL_TABLET | Freq: Two times a day (BID) | ORAL | 3 refills | Status: AC

## 2024-02-09 DIAGNOSIS — N179 Acute kidney failure, unspecified: Secondary | ICD-10-CM | POA: Diagnosis not present

## 2024-02-09 DIAGNOSIS — J9621 Acute and chronic respiratory failure with hypoxia: Secondary | ICD-10-CM | POA: Diagnosis not present

## 2024-02-09 DIAGNOSIS — D6869 Other thrombophilia: Secondary | ICD-10-CM | POA: Diagnosis not present

## 2024-02-09 DIAGNOSIS — J441 Chronic obstructive pulmonary disease with (acute) exacerbation: Secondary | ICD-10-CM | POA: Diagnosis not present

## 2024-02-09 DIAGNOSIS — N185 Chronic kidney disease, stage 5: Secondary | ICD-10-CM | POA: Diagnosis not present

## 2024-02-09 DIAGNOSIS — L578 Other skin changes due to chronic exposure to nonionizing radiation: Secondary | ICD-10-CM | POA: Diagnosis not present

## 2024-02-09 DIAGNOSIS — L570 Actinic keratosis: Secondary | ICD-10-CM | POA: Diagnosis not present

## 2024-02-09 DIAGNOSIS — L814 Other melanin hyperpigmentation: Secondary | ICD-10-CM | POA: Diagnosis not present

## 2024-02-09 DIAGNOSIS — I5032 Chronic diastolic (congestive) heart failure: Secondary | ICD-10-CM | POA: Diagnosis not present

## 2024-02-09 DIAGNOSIS — I4819 Other persistent atrial fibrillation: Secondary | ICD-10-CM | POA: Diagnosis not present

## 2024-02-09 DIAGNOSIS — D225 Melanocytic nevi of trunk: Secondary | ICD-10-CM | POA: Diagnosis not present

## 2024-02-09 DIAGNOSIS — R1310 Dysphagia, unspecified: Secondary | ICD-10-CM | POA: Diagnosis not present

## 2024-02-09 DIAGNOSIS — I132 Hypertensive heart and chronic kidney disease with heart failure and with stage 5 chronic kidney disease, or end stage renal disease: Secondary | ICD-10-CM | POA: Diagnosis not present

## 2024-02-10 DIAGNOSIS — N179 Acute kidney failure, unspecified: Secondary | ICD-10-CM | POA: Diagnosis not present

## 2024-02-10 DIAGNOSIS — J441 Chronic obstructive pulmonary disease with (acute) exacerbation: Secondary | ICD-10-CM | POA: Diagnosis not present

## 2024-02-10 DIAGNOSIS — R1310 Dysphagia, unspecified: Secondary | ICD-10-CM | POA: Diagnosis not present

## 2024-02-10 DIAGNOSIS — I4819 Other persistent atrial fibrillation: Secondary | ICD-10-CM | POA: Diagnosis not present

## 2024-02-10 DIAGNOSIS — J9621 Acute and chronic respiratory failure with hypoxia: Secondary | ICD-10-CM | POA: Diagnosis not present

## 2024-02-10 DIAGNOSIS — D6869 Other thrombophilia: Secondary | ICD-10-CM | POA: Diagnosis not present

## 2024-02-10 DIAGNOSIS — I132 Hypertensive heart and chronic kidney disease with heart failure and with stage 5 chronic kidney disease, or end stage renal disease: Secondary | ICD-10-CM | POA: Diagnosis not present

## 2024-02-10 DIAGNOSIS — I5032 Chronic diastolic (congestive) heart failure: Secondary | ICD-10-CM | POA: Diagnosis not present

## 2024-02-10 DIAGNOSIS — N185 Chronic kidney disease, stage 5: Secondary | ICD-10-CM | POA: Diagnosis not present

## 2024-02-11 DIAGNOSIS — R1310 Dysphagia, unspecified: Secondary | ICD-10-CM | POA: Diagnosis not present

## 2024-02-11 DIAGNOSIS — I132 Hypertensive heart and chronic kidney disease with heart failure and with stage 5 chronic kidney disease, or end stage renal disease: Secondary | ICD-10-CM | POA: Diagnosis not present

## 2024-02-11 DIAGNOSIS — D6869 Other thrombophilia: Secondary | ICD-10-CM | POA: Diagnosis not present

## 2024-02-11 DIAGNOSIS — I5032 Chronic diastolic (congestive) heart failure: Secondary | ICD-10-CM | POA: Diagnosis not present

## 2024-02-11 DIAGNOSIS — J9621 Acute and chronic respiratory failure with hypoxia: Secondary | ICD-10-CM | POA: Diagnosis not present

## 2024-02-11 DIAGNOSIS — N185 Chronic kidney disease, stage 5: Secondary | ICD-10-CM | POA: Diagnosis not present

## 2024-02-11 DIAGNOSIS — J441 Chronic obstructive pulmonary disease with (acute) exacerbation: Secondary | ICD-10-CM | POA: Diagnosis not present

## 2024-02-11 DIAGNOSIS — I4819 Other persistent atrial fibrillation: Secondary | ICD-10-CM | POA: Diagnosis not present

## 2024-02-11 DIAGNOSIS — N179 Acute kidney failure, unspecified: Secondary | ICD-10-CM | POA: Diagnosis not present

## 2024-02-22 ENCOUNTER — Telehealth: Payer: Self-pay

## 2024-02-22 NOTE — Telephone Encounter (Signed)
 Samuel Mcconnell from [facility] dropped off paperwork for patient, forms placed in Dr. Melene comes.

## 2024-02-22 NOTE — Telephone Encounter (Signed)
 Copied from CRM #[id number]. Topic: General - Other >> Feb 22, 2024 10:17 AM Selinda RAMAN wrote: Reason for CRM: Sharyne Mohr with [facility] will be bringing by paperwork around 11:30 for the provider to hopefully sign off on by tomorrow. She said this is addendums to previous received paperwork where she is requesting certain things like verifying his oxygen level and medications. She needs this as soon as possible as he is a new resident and this is needed in order for her to fill meds for him. I gave Bobbette the heads up and Sharyne may ask for [facility]. Please assist patient further.

## 2024-02-23 NOTE — Telephone Encounter (Signed)
 Contacted Sharyne Mohr at [facility] for pick-up.

## 2024-03-08 ENCOUNTER — Telehealth: Payer: Self-pay

## 2024-03-08 NOTE — Telephone Encounter (Signed)
 This task has been completed as requested. Spoke to [place] at [facility] at [phone]. Verbal order for skilled nursing provided. No further action needed.

## 2024-03-08 NOTE — Telephone Encounter (Signed)
 Copied from CRM #[id number]. Topic: Clinical - Home Health Verbal Orders >> Mar 08, 2024  1:18 PM Willma SAUNDERS wrote: Caller/Agency: [facility] Callback Number: [phone] Service Requested: Skilled Nursing Frequency: 1 week 6 with 2 PRNS Any new concerns about the patient? No

## 2024-03-09 ENCOUNTER — Ambulatory Visit: Payer: Self-pay

## 2024-03-09 ENCOUNTER — Telehealth: Payer: Self-pay | Admitting: Cardiovascular Disease

## 2024-03-09 DIAGNOSIS — I5032 Chronic diastolic (congestive) heart failure: Secondary | ICD-10-CM

## 2024-03-09 DIAGNOSIS — Z79899 Other long term (current) drug therapy: Secondary | ICD-10-CM

## 2024-03-09 NOTE — Telephone Encounter (Signed)
 Daughter [name] called back after speaking to cardiology office. No answer for cardiology. Reports patient has been urinating several times and has been elevating his legs. Daughter is wanting to know if additional lasix  for patient. States lab work and XR can be done at the facility. Daughter is trying to avoid bringing patient to the ED. Asking for a call back from the office. Fax number [phone] for the facility.

## 2024-03-09 NOTE — Telephone Encounter (Signed)
 Pt c/o swelling: STAT is pt has developed SOB within 24 hours  How much weight have you gained and in what time span? Don't think there's been any weight gained, clothes and everything is fitting about the same   If swelling, where is the swelling located? Both legs and weeping clear fluid above the ankle and knee of the right leg.   Are you currently taking a fluid pill? Yes  Are you currently SOB? No  Do you have a log of your daily weights (if so, list)? No  Have you gained 3 pounds in a day or 5 pounds in a week? Not that they are aware of.   Have you traveled recently? Locally

## 2024-03-09 NOTE — Telephone Encounter (Signed)
 Patient's daughter, Samuel Mcconnell, reports patient was seen by home health RN for evaluation yesterday and swelling in legs was noted to be a 1-2+ in both legs. Today edema has progressed to 3-4+ pitting edema, right worse than left.  Patient has been elevating leg throughout the day and Samuel Mcconnell notes edema has come down now to about 2-2.5+. Denies any redness, warmth, chest pain, or shortness of breath.  Right leg is weeping clear fluid. Patient is currently taking Lasix  40 mg daily. Urine output good. Samuel Mcconnell is asking if patient can take an extra dose of Lasix  for 2-3 days to help pull off more fluid. She states patient is at a facility and they can draw labs and obtain a chest x-ray if needed. Orders would need to be faxed to facility nurse, Samuel Mcconnell at [phone].  Samuel Mcconnell notes patient's HR has been a little irregular today, though this is not unusual for him with his history of A-Fib.  Samuel Mcconnell would also like to know if the home health nurse could be contacted and come back out for a visit to wrap patient's legs for compression as TED hose will not fit over his legs right now.  Will forward to Dr. Francyne to review for further advisement.

## 2024-03-09 NOTE — Telephone Encounter (Signed)
 FYI Only or Action Required?: FYI only for provider: Advised wife to reach out to cardiologist office.  Patient was last seen in primary care on 01/20/2024 by Alvia Bring, DO.  Called Nurse Triage reporting Leg Swelling.  Symptoms began several days ago.  Interventions attempted: Prescription medications: Lasix , amiodarone , eliquis .  Symptoms are: gradually worsening.  Triage Disposition: Call Specialist Now (overriding Call PCP Now)  Patient/caregiver understands and will follow disposition?: Yes   Spoke with pts daughter [name] (on [place]). Increased swelling in both legs up to the knee yesterday, worse in right leg. 3-4+ pitting edema in right, 1-2+ in left leg. Normally has some mild swelling in his legs at baseline with right being worse than left. Weeping serous fluid from right leg only. Denies hx of CHF, only afib. Chart lists CHF, wife thinks diagnosis was made during a previous hospitalization for COPD. Taking lasix  40 mg, daily, has not missed any doses. No increased leg warmth, a little red on the lateral part of leg after falling on Monday evening with skin tears only and hit leg. Did not hit head or lose consciousness.   Has home health. HR yesterday 100, today 88-116, fluctuates, hx of afib, taking amiodarone  and eliquis . Not always in afib, occurs when there's been a disruption to his routine as occurred with recent fall and family visiting. BP 146/80 today, O2 96% on 2L O2 (baseline with COPD). No fever. Denies SOB or CP or recent weight gain. 180 lbs which is normal for him. Pt being followed by Dr. Francyne with [facility] at [facility]. Advised wife to call their office at [phone] to discuss symptoms, agreeable to do so. Forwarding pt symptoms to PCP office as well. Advised UC or ED for worsening symptoms in the meantime.    Copied from CRM #[id number]. Topic: General - Other >> Mar 09, 2024 11:18 AM Joesph NOVAK wrote: Reason for CRM: 3-4 pitting edema in  legs (right lower leg) fluid coming out, heart rate 108. Reason for Disposition  SEVERE leg swelling (e.g., swelling extends above knee, entire leg is swollen, weeping fluid)  Answer Assessment - Initial Assessment Questions 1. MAIN CONCERN OR SYMPTOM: What is your main concern right now? What's the main symptom you're worried about? (e.g., breathing difficulty, ankle swelling, weight gain).     Increased leg swelling, right worse than left with weeping 2. ONSET: When did the  Swelling  start?     Yesterday 3. BREATHING DIFFICULTY: Are you having any difficulty breathing? If Yes, ask: How bad is it?  (e.g., none, mild, moderate, severe)  Is this worse than usual for you?     Denies 4. EDEMA - FOOT-LEG SWELLING: Do you have swelling of your ankles, feet or legs? If Yes, ask: How bad is the swelling? (e.g., localized; mild, moderate, severe)     Both legs from ankles to knees, right worse than left 5. WEIGHT - CURRENT: What is your weight today?     180 lbs 6. WEIGHT - TARGET RANGE: Do you try to keep your weight in a target (goal) range? If Yes, ask: What is that range?     Ranges from 178-181 lbs 7. WEIGHT - CHANGE: Have you gained (or lost) weight in the past 24 hours? Past week (7 days)? If Yes, ask:  How much weight?     Denies 8. OTHER SYMPTOMS: Do you have any other symptoms? (e.g., depression, weakness or fatigue, abdomen bloating, hacky cough)     Mild cough  intermittent non-productive, has allergies. HR fluctuating 80-110's, hx of afib. 9. DIURETICS: Are you currently taking water pills? (e.g., furosemide  [Lasix ], hydrochlorothiazide [HCTZ], bumetanide [Bumex], metolazone [Zaroxolyn]) If Yes, ask: What medicine are you taking, and how often?  Any recent change in dose?      Lasis 40 mg daily 10. O2 SATURATION MONITOR: Do you use an oxygen saturation monitor (pulse oximeter) at home? If Yes, ask: What is your reading (oxygen level) today? What is  your usual oxygen saturation reading? (e.g., 95%)       96% on 2L O2, baseline with COPD 11. HEART FAILURE HCP: Who treats your heart failure?  (e.g., cardiologist or heart specialist, heart failure clinic or center, primary care doctor)       Croitoru, Jerel, MD 12. FLUID and SODIUM RESTRICTIONS: Have you been instructed to restrict your daily fluid intake or sodium (salt) intake? If Yes, ask: What are your daily limits?       Denies  Protocols used: Heart Failure on Treatment Follow-up Call-[facility]

## 2024-03-09 NOTE — Telephone Encounter (Signed)
 This, please asked them to increase the furosemide  to 80 mg daily and increase the potassium chloride  to 20 mEq daily for 3 days, check labs on Monday (BMET and Mg). Thanks

## 2024-03-10 ENCOUNTER — Encounter: Payer: Self-pay | Admitting: Family Medicine

## 2024-03-10 ENCOUNTER — Encounter: Payer: Self-pay | Admitting: Cardiovascular Disease

## 2024-03-10 LAB — BASIC METABOLIC PANEL WITH GFR: EGFR: 46

## 2024-03-10 NOTE — Telephone Encounter (Signed)
 Spoke with patient's daughter Samuel Mcconnell and shared recommendation from Dr. Francyne: This, please asked them to increase the furosemide  to 80 mg daily and increase the potassium chloride  to 20 mEq daily for 3 days, check labs on Monday (BMET and Mg). Thanks   Samuel Mcconnell states patient had a good night, the assisted living staff at [facility] kept his legs elevated and weeping has slowed down some.   Orders have been faxed to Aleck Hopkins at [facility], to fax number [phone].  Samuel Mcconnell verbalized understanding and expressed appreciation for follow-up.              -------Fax Transmission Report-------  To:               Recipient at [phone] Subject:          Medication change orders and lab orders (Samuel Mcconnell, 12-15-1927) Result:           The transmission was successful. Explanation:      All Pages Ok Pages Sent:       5 Connect Time:     1 minutes, 38 seconds Transmit Time:    03/10/2024 08:27 Transfer Rate:    14400 Status Code:      0000 Retry Count:      0 Job Id:           [id number] Unique Id:        [id number] Fax Line:         59 Fax Server:       [id number]

## 2024-03-11 ENCOUNTER — Ambulatory Visit: Payer: Self-pay | Admitting: Cardiovascular Disease

## 2024-03-13 LAB — LAB REPORT - SCANNED: EGFR: 38

## 2024-03-24 ENCOUNTER — Telehealth: Payer: Self-pay

## 2024-03-24 NOTE — Telephone Encounter (Signed)
 Copied from CRM [id number]. Topic: General - Other >> Mar 24, 2024 12:23 PM Mercer PEDLAR wrote: Reason for CRM: [facility]  Charlena stated that patient moved to new address and is starting care at a new branch. he is requesting a new order for nursing, OT and PT.   Fax: [phone]

## 2024-03-24 NOTE — Telephone Encounter (Signed)
 Spoke with patient daughter [name]-  States can receive Mychart message for patient.   Denies CP, SOB , - asked about possible UTI- states that patient has not had fever,  emptying urinal shows clear and normal color without odor. Some slight confusion when awakens in morning  and occasionally losses words  and slight decrease in memory.   She states patient has known A-fib so elevated HR is an ongoing happening.   Patient is scheduled with Cardiology for 04/10/24.   She will take patient to ER if any concerning symptoms should arise.

## 2024-03-24 NOTE — Telephone Encounter (Signed)
 Copied from CRM [id number]. Topic: Clinical - Medical Advice >> Mar 24, 2024  4:00 PM Amy B wrote: Reason for CRM: Olam, with [facility] called stating that the patient's heart rate has been irregular around 110 with auscultation.  No dizziness or lightheadedness.  Fell yesterday from wheelchair to walker.  Skin tear right lower leg.

## 2024-03-27 NOTE — Telephone Encounter (Signed)
 Gave verbal orders to [place].

## 2024-04-03 ENCOUNTER — Telehealth: Payer: Self-pay

## 2024-04-03 NOTE — Telephone Encounter (Signed)
 Copied from CRM #[id number]. Topic: Clinical - Home Health Verbal Orders >> Apr 03, 2024  9:09 AM Antony RAMAN wrote: Caller/Agency: debra with [facility] Callback Number: [phone] Service Requested: Skilled Nursing Frequency: 2x a week for 8 weeks with 2 prn's Any new concerns about the patient? No

## 2024-04-05 NOTE — Telephone Encounter (Signed)
 Debra with [facility] informed of approval of verbal orders.

## 2024-04-07 ENCOUNTER — Telehealth: Payer: Self-pay

## 2024-04-07 NOTE — Telephone Encounter (Signed)
 VO given per Dr. Alvia

## 2024-04-07 NOTE — Telephone Encounter (Signed)
 Copied from CRM [id number]. Topic: Clinical - Home Health Verbal Orders >> Apr 07, 2024  9:47 AM Rosaria BRAVO wrote: Caller/Agency: Marcey Duncans [facility]  Callback Number: [phone] Service Requested: Physical Therapy Frequency:  1w1 2w2 1 every 2 weeks for 4  1w1 Any new concerns about the patient? No

## 2024-04-10 ENCOUNTER — Encounter: Payer: Self-pay | Admitting: Cardiovascular Disease

## 2024-04-10 ENCOUNTER — Ambulatory Visit: Admitting: Cardiovascular Disease

## 2024-04-10 VITALS — BP 100/74 | HR 101 | Ht 71.0 in | Wt 171.8 lb

## 2024-04-10 DIAGNOSIS — I495 Sick sinus syndrome: Secondary | ICD-10-CM

## 2024-04-10 DIAGNOSIS — D508 Other iron deficiency anemias: Secondary | ICD-10-CM | POA: Diagnosis not present

## 2024-04-10 DIAGNOSIS — D6869 Other thrombophilia: Secondary | ICD-10-CM

## 2024-04-10 DIAGNOSIS — Z79899 Other long term (current) drug therapy: Secondary | ICD-10-CM | POA: Diagnosis not present

## 2024-04-10 DIAGNOSIS — E039 Hypothyroidism, unspecified: Secondary | ICD-10-CM

## 2024-04-10 DIAGNOSIS — I5032 Chronic diastolic (congestive) heart failure: Secondary | ICD-10-CM

## 2024-04-10 DIAGNOSIS — M7989 Other specified soft tissue disorders: Secondary | ICD-10-CM

## 2024-04-10 DIAGNOSIS — J441 Chronic obstructive pulmonary disease with (acute) exacerbation: Secondary | ICD-10-CM

## 2024-04-10 DIAGNOSIS — I480 Paroxysmal atrial fibrillation: Secondary | ICD-10-CM

## 2024-04-10 DIAGNOSIS — R000 Tachycardia, unspecified: Secondary | ICD-10-CM | POA: Diagnosis not present

## 2024-04-10 DIAGNOSIS — Z5181 Encounter for therapeutic drug level monitoring: Secondary | ICD-10-CM | POA: Diagnosis not present

## 2024-04-10 DIAGNOSIS — I951 Orthostatic hypotension: Secondary | ICD-10-CM

## 2024-04-10 DIAGNOSIS — I2781 Cor pulmonale (chronic): Secondary | ICD-10-CM

## 2024-04-10 DIAGNOSIS — N1832 Chronic kidney disease, stage 3b: Secondary | ICD-10-CM | POA: Diagnosis not present

## 2024-04-10 DIAGNOSIS — J9611 Chronic respiratory failure with hypoxia: Secondary | ICD-10-CM | POA: Diagnosis not present

## 2024-04-10 MED ORDER — FUROSEMIDE 20 MG PO TABS: 60.0000 mg | ORAL_TABLET | Freq: Every day | ORAL | 3 refills | Status: AC

## 2024-04-10 NOTE — Patient Instructions (Signed)
 Medication Instructions:  Increase Furosemide  to 60 mg daily *If you need a refill on your cardiac medications before your next appointment, please call your pharmacy*  Thigh- high compression stockings. 15-20 mmhg- wear during the day and take off during sleep  Lab Work: CBC, CMP, TSH, Pro BNP, Iron  panel If you have labs (blood work) drawn today and your tests are completely normal, you will receive your results only by: MyChart Message (if you have MyChart) OR A paper copy in the mail If you have any lab test that is abnormal or we need to change your treatment, we will call you to review the results.  Testing/Procedures: None ordered  Follow-Up: At [facility], you and your health needs are our priority.  As part of our continuing mission to provide you with exceptional heart care, our providers are all part of one team.  This team includes your primary Cardiologist (physician) and Advanced Practice Providers or APPs (Physician Assistants and Nurse Practitioners) who all work together to provide you with the care you need, when you need it.  Your next appointment:   6 month(s)  Provider:   Jerel Balding, MD    We recommend signing up for the patient portal called MyChart.  Sign up information is provided on this After Visit Summary.  MyChart is used to connect with patients for Virtual Visits (Telemedicine).  Patients are able to view lab/test results, encounter notes, upcoming appointments, etc.  Non-urgent messages can be sent to your provider as well.   To learn more about what you can do with MyChart, go to [email or web address].

## 2024-04-11 ENCOUNTER — Telehealth: Payer: Self-pay | Admitting: Emergency Medicine

## 2024-04-11 NOTE — Telephone Encounter (Signed)
 Received a fax from Marcey Rough, PT, [facility]  Called and left response: Dr Francyne said that the Patient can exercise to symptom tolerance. Left call back number.

## 2024-04-14 ENCOUNTER — Telehealth: Payer: Self-pay

## 2024-04-14 NOTE — Telephone Encounter (Signed)
 Copied from CRM #[id number]. Topic: Clinical - Home Health Verbal Orders >> Apr 14, 2024  8:19 AM Emylou G wrote: Caller/Agency: Shanda Lush w/[facility] Callback Number: [phone] secured line Service Requested: Occupational Therapy Frequency: 1w8 Any new concerns about the patient? No

## 2024-04-14 NOTE — Telephone Encounter (Signed)
 VO left on Samuel Mcconnell secure VM.

## 2024-04-15 NOTE — Progress Notes (Signed)
 "   Cardiology Office Note    Date:  04/15/2024   ID:  Wane Mollett, DOB Mar 09, 1928, MRN [id number]  PCP:  Alvia Bring, DO  Cardiologist:   Jerel Balding, MD   No chief complaint on file.    History of Present Illness:  Samuel Mcconnell is a 89 y.o. male with history of radiofrequency ablation for atrial fibrillation roughly 10 years ago, recently recurrent as persistent atrial fibrillation for which he underwent elective cardioversion successfully in August 2022.  He has a tendency to have sinus bradycardia as well as first-degree AV block and right bundle branch block.  His wife Samuel Mcconnell was also my patient, but passed away from sepsis in 2023-05-18  (they had celebrated 19 years of marriage just 3 months earlier).  He lives at [facility].  Samuel Mcconnell is here with his daughter, Samuel Mcconnell.  He is wearing oxygen.  He is in a wheelchair.  He is in atrial fibrillation with a ventricular rate of about 100 bpm but is unaware of any palpitations.  Reportedly at the nursing facility he weighed 285 pounds but here he weighs 172 pounds.  He was once again hospitalized for acute on chronic respiratory failure with hypoxia due to COPD exacerbation for a week in October 2025, requiring BiPAP support for a while.  He remains on oxygen.  He has been physically weak since then.  His blood pressure has been running low and he is persistently borderline tachycardic.  During that hospitalization his proBNP was modestly elevated at 1034 and his troponin was modestly elevated at 86-97, adynamic.  Renal function was worse at 1.8 on arrival and he received IV fluids with improvement to 1.2.  He is back on furosemide  40 mg daily, but has a lot of edema.  He does not have any weeping wounds, but is wearing some soft Velcro strap shoes since he can get his usual shoes on.  He does not have orthopnea or PND.  He cannot put on his own compression stockings without assistance.  He has not had chest pain dizziness or syncope but  remains very unsteady on his feet.  He was previously hospitalized 02/16 - 05/11/2023 with COPD exacerbation and acute hypoxic respiratory failure.   His echocardiogram showed hyperdynamic left and right ventricular function and both his atria were described as normal in size. Since he has had recurrent problems with pneumonia (most recently admitted in August 2023) there is some suspicion he may be having aspiration.  He has had an evaluation by speech pathologist and has undergone some therapy for this.  In December 2025 an arrhythmia monitor showed very frequent but brief episodes of ectopic atrial tachycardia, but no atrial fibrillation.  This improved substantially when we increased his amiodarone  to daily.  He wore another arrhythmia monitor in March 2025, once again without atrial fibrillation and with a much lower burden of brief nonsustained ectopic atrial tachycardia.  Neither monitor showed problems with bradycardia.  In 05-18-19 he had persistent atrial fibrillation for a couple of months before he required hospitalization for RVR.  He is well under our previously estimated dry weight of 177 pounds, but he has clearly lost a lot of true weight.  Not sure what his dry weight is anymore.  There is also discrepancy between our office weight today at 172 pounds and his home weight recorded at 185 pounds.  His most recent labs were performed 03/13/2024 that showed an elevated creatinine at 1.68 with an estimated GFR of 38.  Potassium  was normal at 4.9 and he has an elevated CO2 at 38.  In October he was anemic: his hemoglobin was normal at hospital discharge on 12/27/2023 at 13.0, with a hemoglobin that dropped to 9.7 on recheck labs on 01/20/2024.  The labs from October are not consistent with iron  deficiency, but they were clearly showing in a state of acute inflammatory response.  His echocardiogram in February 2025 continues to show normal left ventricular systolic function (EF 70 to 75%) no  significant valvular abnormalities and both his atria were described as being normal in size.  PA pressure was not reported.  His inferior vena cava was described as dilated and plethoric.  An echo in 2021 at [facility] he described his right heart chambers as being mildly dilated and estimated his systolic PA pressure at 50-59 mmHg.  On September 07 2019, he fell down the basement stairs while carrying heavy equipment.  On arrival to the hospital [facility]) he was found to be in atrial fibrillation.  Looking back his family wonders whether the arrhythmia may have started a month or 2 before, when he started complaining of some fatigue.  He was hospitalized and rate control was achieved on a combination of metoprolol  and diltiazem .  Eliquis  was started.  He had an echocardiogram that showed normal left ventricular systolic function with mild to moderate tricuspid regurgitation and moderate pulmonary hypertension.    He has hypertension, COPD, treated hypothyroidism and acid reflux. He has a long-standing right bundle branch block.  Past Medical History:  Diagnosis Date   Arthritis    Atrial fibrillation (HCC)    radiofrequency ablation   Cataract    CHF (congestive heart failure) (HCC)    Clotting disorder    due to medication   Emphysema    Emphysema of lung (HCC)    Hypertension    Hypoxemia 12/14/2023   Thyroid  disease    Ulcer    GI years prior    Past Surgical History:  Procedure Laterality Date   CARDIAC SURGERY     CARDIOVERSION N/A 10/24/2019   Procedure: CARDIOVERSION;  Surgeon: Francyne Headland, MD;  Location: [facility] ENDOSCOPY;  Service: Cardiovascular;  Laterality: N/A;   CARDIOVERSION N/A 10/22/2020   Procedure: CARDIOVERSION;  Surgeon: Alveta Aleene PARAS, MD;  Location: [facility] ENDOSCOPY;  Service: Cardiovascular;  Laterality: N/A;   COLON SURGERY  03/23/1997   EYE SURGERY     KNEE ARTHROSCOPY Left 07/21/2000   LOOP RECORDER IMPLANT  01/16/2010   Medtronic Reveal XT (Dr. HILARIO Jester)     RADIOFREQUENCY ABLATION  06/13/2009   SHOULDER ARTHROSCOPY WITH ROTATOR CUFF REPAIR Left 04/23/1996   TIBIAL TUBERCLERPLASTY  ~ 10/2010   TRANSTHORACIC ECHOCARDIOGRAM  11/19/2011   EF=>55%; mild MR, mild mitral annular calcif; mild TR, normal RSVP; AV mildly sclerotic, mild AV regurg; trace pulm valve regurg     Current Medications: Outpatient Medications Prior to Visit  Medication Sig Dispense Refill   ascorbic acid (VITAMIN C) 500 MG tablet Take 500-1,000 mg by mouth in the morning. (Patient taking differently: Take 500 mg by mouth every evening.)     ferrous gluconate  (FERGON) 240 (27 FE) MG tablet Take 1 tablet (240 mg total) by mouth 2 (two) times daily before a meal. (Patient taking differently: Take 240 mg by mouth daily before breakfast.) 60 tablet 3   levothyroxine  (SYNTHROID ) 137 MCG tablet Take 1 tablet (137 mcg total) by mouth daily before breakfast. 90 tablet 0   MIRALAX  17 GM/SCOOP powder Take 8.5-17 g by  mouth daily as needed for mild constipation (to be mixed into 4-8 ounces of liquid).     Multiple Vitamin (MULTIVITAMIN) capsule Take 1 capsule by mouth daily with breakfast.     potassium chloride  (KLOR-CON ) 10 MEQ tablet Take 1 tablet (10 mEq total) by mouth daily. 90 tablet 1   rOPINIRole  (REQUIP ) 0.25 MG tablet Take 0.75 mg by mouth at bedtime and an additional 0.25 mg as needed for unresolved restless legs 270 tablet 3   Spacer/Aero-Holding Raguel FRENCH Use with inhaler. 1 Units 0   triamcinolone  ointment (KENALOG ) 0.1 % Apply 1 Application topically 2 (two) times daily as needed (irritation).     TYLENOL  8 HOUR ARTHRITIS PAIN 650 MG CR tablet Take 650 mg by mouth in the morning and at bedtime.     furosemide  (LASIX ) 40 MG tablet Take 1 tablet (40 mg total) by mouth daily. 90 tablet 1   albuterol  (VENTOLIN  HFA) 108 (90 Base) MCG/ACT inhaler Inhale 2 puffs into the lungs every 6 (six) hours as needed for wheezing or shortness of breath. (Patient not taking: Reported on  04/10/2024) 8 g 1   ALLEGRA ALLERGY 180 MG tablet Take 180 mg by mouth at bedtime.     AMBULATORY NON FORMULARY MEDICATION Please provide toilet riser/elevator with arms.  Diagnosis: Generalized weakness R53.1 1 Units 0   AMBULATORY NON FORMULARY MEDICATION Please provide home oxygen concentrator.  Flow 2 LPM O2 on RA at rest-89% O2 on 2LPM O2 at rest- 94% 1 Device 0   AMBULATORY NON FORMULARY MEDICATION Please provide hospital bed for patient. Keep head of bed at 30 degrees  Aspiration: T17.998A  Frequent falls: R29.6 1 Device 0   amiodarone  (PACERONE ) 200 MG tablet Take 1 tablet (200 mg total) by mouth daily. 90 tablet 3   apixaban  (ELIQUIS ) 2.5 MG TABS tablet Take 1 tablet (2.5 mg total) by mouth 2 (two) times daily. 180 tablet 1   BREZTRI  AEROSPHERE 160-9-4.8 MCG/ACT AERO inhaler INHALE 2 PUFFS INTO THE LUNGS 2 TIMES DAILY 32.1 g 3   Cholecalciferol  (VITAMIN D3) 125 MCG (5000 UT) TABS Take 5,000 Units by mouth daily with lunch.     diclofenac Sodium (VOLTAREN) 1 % GEL Apply 2 g topically See admin instructions. Apply 2 grams to the affected areas at bedtime and an additional 2 grams up to three times a day as needed for pain (Patient not taking: Reported on 04/10/2024)     diphenhydramine -acetaminophen  (TYLENOL  PM) 25-500 MG TABS tablet Take 1 tablet by mouth at bedtime as needed (for sleeplessness- if no Tylenol  650 mg tablets have been taken).     fluticasone  (FLONASE ) 50 MCG/ACT nasal spray Place 2 sprays into both nostrils daily as needed for allergies or rhinitis. (Patient taking differently: Place 2 sprays into both nostrils daily.) 16 g 0   ipratropium-albuterol  (DUONEB) 0.5-2.5 (3) MG/3ML SOLN Take 3 mLs by nebulization every 6 (six) hours as needed. (Patient not taking: Reported on 04/10/2024) 120 mL 3   NON FORMULARY Take 2 tablets by mouth See admin instructions. SuperBeets Heart Chews- Chew 2 tablets by mouth once a day     traMADol  (ULTRAM ) 50 MG tablet Take 0.5 tablets (25 mg total) by  mouth every 8 (eight) hours as needed (for pain). (Patient not taking: Reported on 04/10/2024) 10 tablet 0   furosemide  (LASIX ) 20 MG tablet Take 20 mg by mouth daily.     No facility-administered medications prior to visit.     Allergies:   Clindamycin/lincomycin, Dabigatran  etexilate mesylate, and Doxycycline   Family History:  The patient's family history includes Atrial fibrillation in his brother, brother, and sister; Cancer in his maternal grandmother; Diabetes in his sister and son; Heart Problems in his sister; Heart disease in his maternal grandfather; Heart failure in his brother, father, mother, and sister; Hypertension in his sister; Pancreatitis in his son.   ROS:   Please see the history of present illness.    ROS All other systems are reviewed and are negative.   PHYSICAL EXAM:   VS:  BP 100/74 (BP Location: Left Arm, Patient Position: Sitting, Cuff Size: Normal)   Pulse (!) 101   Ht 5' 11 (1.803 m)   Wt 171 lb 12.8 oz (77.9 kg)   SpO2 96%   BMI 23.96 kg/m      General: Alert, oriented x3, no distress, he appears more elderly and frail than in the past.  Starting to look his true age. Head: no evidence of trauma, PERRL, EOMI, no exophtalmos or lid lag, no myxedema, no xanthelasma; normal ears, nose and oropharynx Neck: normal jugular venous pulsations and no hepatojugular reflux; brisk carotid pulses without delay and no carotid bruits Chest: clear to auscultation, no signs of consolidation by percussion or palpation, normal fremitus, symmetrical and full respiratory excursions Cardiovascular: normal position and quality of the apical impulse, irregular rhythm, normal first and second heart sounds, no murmurs, rubs or gallops Abdomen: no tenderness or distention, no masses by palpation, no abnormal pulsatility or arterial bruits, normal bowel sounds, no hepatosplenomegaly Extremities: 3+ bilateral soft pitting edema to the knees Neurological: grossly nonfocal Psych:  Normal mood and affect    Wt Readings from Last 3 Encounters:  04/10/24 171 lb 12.8 oz (77.9 kg)  01/20/24 178 lb (80.7 kg)  12/22/23 175 lb 0.7 oz (79.4 kg)      Studies/Labs Reviewed:   ECHO 05/11/2023   1. Left ventricular ejection fraction, by estimation, is 70 to 75%. The  left ventricle has hyperdynamic function. The left ventricle has no  regional wall motion abnormalities. There is severe concentric left  ventricular hypertrophy. Left ventricular  diastolic parameters are indeterminate.   2. Right ventricular systolic function is hyperdynamic. The right  ventricular size is normal.   3. The mitral valve is normal in structure. No evidence of mitral valve  regurgitation. No evidence of mitral stenosis.   4. The aortic valve is tricuspid. Aortic valve regurgitation is not  visualized. No aortic stenosis is present.   5. The inferior vena cava is dilated in size with <50% respiratory  variability, suggesting right atrial pressure of 15 mmHg.   EKG: Most recent previous ECG from 12/22/2023 showed atrial fibrillation with controlled ventricular response, while the prior ECG from April 2025 shows sinus rhythm with premature atrial contractions.  All the tracing showed right bundle branch block.  EKG Interpretation Date/Time:  Monday April 10 2024 15:15:32 EST Ventricular Rate:  101 PR Interval:    QRS Duration:  166 QT Interval:  416 QTC Calculation: 539 R Axis:   99  Text Interpretation: Atrial fibrillation with rapid ventricular response Right bundle branch block T wave abnormality, consider inferior ischemia When compared with ECG of 22-Dec-2023 09:33, Rate faster Confirmed by Yolonda Purtle ([id number]) on 04/10/2024 3:24:49 PM         Recent Labs:    Latest Ref Rng & Units 01/20/2024    2:01 PM 12/27/2023    4:09 AM 12/26/2023    5:00 AM  BMP  Glucose  70 - 99 mg/dL 897  895  98   BUN 10 - 36 mg/dL 35  38  40   Creatinine 0.76 - 1.27 mg/dL 8.71  8.79  8.75    BUN/Creat Ratio 10 - 24 27     Sodium 134 - 144 mmol/L 138  137  140   Potassium 3.5 - 5.2 mmol/L 5.2  4.4  4.2   Chloride 96 - 106 mmol/L 98  99  101   CO2 20 - 29 mmol/L 29  28  30    Calcium 8.6 - 10.2 mg/dL 8.9  8.7  8.7    Labs from 03/13/2024 from [facility] show BUN 28, creatinine 1.68, estimated GFR 38, potassium 4.9, venous CO2 38, magnesium  1.8  Lipid Panel  No results found for: CHOL, TRIG, HDL, CHOLHDL, VLDL, LDLCALC, LDLDIRECT, LABVLDL   ASSESSMENT:    1. Paroxysmal atrial fibrillation (HCC)   2. Acquired thrombophilia   3. Encounter for monitoring amiodarone  therapy   4. Tachycardia-bradycardia syndrome (HCC)   5. COPD with acute exacerbation (HCC)   6. Chronic respiratory failure with hypoxia (HCC)   7. Chronic heart failure with preserved ejection fraction (HFpEF) (HCC)   8. Cor pulmonale, chronic (HCC)   9. Swelling of both lower extremities   10. Medication management   11. Other iron  deficiency anemia   12. Stage 3b chronic kidney disease (HCC)   13. Acquired hypothyroidism   14. Orthostatic hypotension       PLAN:  In order of problems listed above:  AFib/atypical atrial flutter: As before, he is arrhythmia unaware.  Borderline rate control.  Continue amiodarone .   He is anticoagulated appropriately: CHADSVasc at least 4 (age 23, HTN, CHF). Anticoagulation: He is quite elderly, and has had volatile kidney function so we have elected to use the 2.5 mg twice daily dose of apixaban  Amiodarone : Normal liver and thyroid  function tests in Oct and July 2025 respectively.  No other signs of toxicity noted. Tachybradycardia syndrome: No signs or symptoms of bradycardia, no bradycardia on 2 recent monitors.  Pacemaker not indicated at this time. CHF: He primarily has there right heart failure due to cor pulmonale from COPD.  He has clearly lost real weight.  He is very edematous and has a hard time putting his shoes on.  He does not have orthopnea.   I am very unsure of his current dry weight and is a big discrepancy between the weight reported at the assisted living facility and in our office.  Needs assistance to wear compression stockings.  We cannot increase the diuretics too much because of orthostatic hypotension, but we will gently increase the furosemide  to 60 mg daily with repeat labs to be performed in the near future. COPD: Recent exacerbation with respiratory failure and hypoxia in October, previous hospitalization in February 2025 and a visit to urgent care in late March 2025.SABRA  Avoiding beta-blockers.   HTN: Blood pressure is borderline low despite the fact that he is not taking any medications for it, after being on antihypertensives for a large part of his adult life.  Has symptoms of orthostatic hypotension. CKD 3b: Stable.  Baseline GFR seems to be around 45, creatinine 1.3-1.5 range, although most recent creatinine was a little worse at 1.  68, corresponding to a GFR of 38. Anemia: The change in his hemoglobin was too fast to represent chronic kidney disease.  Need to reevaluate hemoglobin levels and iron  studies, especially since he is on chronic anticoagulation with Eliquis .  Hypothyroidism: Clinically euthyroid on levothyroxine  supplements.  Most recent TSH checked in July was normal at 2.67.  Medication Adjustments/Labs and Tests Ordered: Current medicines are reviewed at length with the patient today.  Concerns regarding medicines are outlined above.  Medication changes, Labs and Tests ordered today are listed in the Patient Instructions below. Patient Instructions  Medication Instructions:  Increase Furosemide  to 60 mg daily *If you need a refill on your cardiac medications before your next appointment, please call your pharmacy*  Thigh- high compression stockings. 15-20 mmhg- wear during the day and take off during sleep  Lab Work: CBC, CMP, TSH, Pro BNP, Iron  panel If you have labs (blood work) drawn today and your  tests are completely normal, you will receive your results only by: MyChart Message (if you have MyChart) OR A paper copy in the mail If you have any lab test that is abnormal or we need to change your treatment, we will call you to review the results.  Testing/Procedures: None ordered  Follow-Up: At [facility], you and your health needs are our priority.  As part of our continuing mission to provide you with exceptional heart care, our providers are all part of one team.  This team includes your primary Cardiologist (physician) and Advanced Practice Providers or APPs (Physician Assistants and Nurse Practitioners) who all work together to provide you with the care you need, when you need it.  Your next appointment:   6 month(s)  Provider:   Jerel Balding, MD    We recommend signing up for the patient portal called MyChart.  Sign up information is provided on this After Visit Summary.  MyChart is used to connect with patients for Virtual Visits (Telemedicine).  Patients are able to view lab/test results, encounter notes, upcoming appointments, etc.  Non-urgent messages can be sent to your provider as well.   To learn more about what you can do with MyChart, go to [email or web address].         Signed, Jerel Balding, MD  04/15/2024 9:56 PM    [facility] [address] [place], [place], [place]  [address] Phone: [phone]; Fax: [phone]   "

## 2024-04-18 ENCOUNTER — Telehealth: Payer: Self-pay

## 2024-04-18 NOTE — Telephone Encounter (Signed)
 Copied from CRM [id number]. Topic: General - Other >> Apr 18, 2024 12:44 PM Myrick T wrote: Reason for CRM: Dorise Bunker from [facility] called to let provider know patient had a fall. He was taken to the hospital but daughter said he was ok to come back home but wanted to skip PT.

## 2024-04-19 NOTE — Telephone Encounter (Signed)
 Melissa from [facility] called to f/u on OT orders. Advise provider was sent the order and she would recv a call back.

## 2024-04-20 LAB — BASIC METABOLIC PANEL WITH GFR: EGFR: 55

## 2024-04-20 NOTE — Telephone Encounter (Addendum)
 Copied from CRM #[id number]. Topic: Clinical - Home Health Verbal Orders >> Apr 14, 2024  8:19 AM Emylou G wrote: Caller/Agency: Shanda Lush w/[facility] Callback Number: [phone] secured line Service Requested: Occupational Therapy Frequency: 1w8 Any new concerns about the patient? No >> Apr 20, 2024  2:25 PM Emylou G wrote: [facility] calling on update on home health orders.. see below

## 2024-04-20 NOTE — Telephone Encounter (Signed)
 Called and spoke with Melissa know I left VO for OT per Dr. Alvia 04/14/24. Stated she didn't receive and now has VO.

## 2024-04-25 ENCOUNTER — Ambulatory Visit: Payer: Self-pay

## 2024-04-25 ENCOUNTER — Encounter: Payer: Self-pay | Admitting: Family Medicine

## 2024-04-25 DIAGNOSIS — R300 Dysuria: Secondary | ICD-10-CM

## 2024-04-25 NOTE — Telephone Encounter (Signed)
 FYI Only or Action Required?: Action required by provider: clinical question for provider.  Patient was last seen in primary care on 01/20/2024 by Alvia Bring, DO.  Called Nurse Triage reporting Dysuria.  Symptoms began several days ago.  Interventions attempted: Nothing.  Symptoms are: unchanged.  Triage Disposition: See Physician Within 24 Hours  Patient/caregiver understands and will follow disposition?: Yes  Message from [place] R sent at 04/25/2024  2:07 PM EST  Reason for Triage: Patient has been stating the last few days he has been having burning when urinating.    [phone] FAX #  Reason for Disposition  Urinating more frequently than usual (i.e., frequency) OR new-onset of the feeling of an urgent need to urinate (i.e., urgency)  Answer Assessment - Initial Assessment Questions Aleck, LPN from [facility] states pt's daughter there to visit and reported pt told her he had burning sx for several days. She is asking if ok to get UA C&S and fax results to provider. Pt is O2 dependent and difficult to transport plus with winter. Myles Aleck I would send message back and have nurse call back. If ok to do UA please fax order to [phone], if pt needs appt call daughter, [name].   1. SYMPTOM: What's the main symptom you're concerned about? (e.g., frequency, incontinence)     Dysuria  2. ONSET: When did the  sx  start?     Started several days ago per daughter   39. CAUSE: What do you think is causing the symptoms?     Possible UTI  5. OTHER SYMPTOMS: Do you have any other symptoms? (e.g., blood in urine, fever, flank pain, pain with urination)     Frequency but also takes lasix   Protocols used: Urinary Symptoms-A-AH

## 2024-04-26 NOTE — Telephone Encounter (Signed)
 Faxed order for UA and Urine culture to [facility] - Beecher Fitch, LPN  To fax # [phone]

## 2024-04-26 NOTE — Telephone Encounter (Signed)
 Fax confirmation Samuel Mcconnell received.

## 2024-04-26 NOTE — Telephone Encounter (Signed)
 Me    04/26/24 10:01 AM Note Fax confirmation page received      Me    04/26/24 10:00 AM Note Faxed order for UA and Urine culture to [facility] - Beecher Fitch, LPN  To fax # [phone]

## 2024-11-30 ENCOUNTER — Ambulatory Visit
